# Patient Record
Sex: Female | Born: 1945
Health system: Southern US, Community
[De-identification: ages and names within clinical notes are randomized; demographics above are authoritative.]

## PROBLEM LIST (undated history)

## (undated) DIAGNOSIS — N289 Disorder of kidney and ureter, unspecified: Secondary | ICD-10-CM

## (undated) DIAGNOSIS — G4730 Sleep apnea, unspecified: Secondary | ICD-10-CM

## (undated) DIAGNOSIS — I10 Essential (primary) hypertension: Secondary | ICD-10-CM

## (undated) DIAGNOSIS — E785 Hyperlipidemia, unspecified: Secondary | ICD-10-CM

## (undated) DIAGNOSIS — E119 Type 2 diabetes mellitus without complications: Secondary | ICD-10-CM

## (undated) DIAGNOSIS — I5032 Chronic diastolic (congestive) heart failure: Secondary | ICD-10-CM

## (undated) DIAGNOSIS — R011 Cardiac murmur, unspecified: Secondary | ICD-10-CM

## (undated) HISTORY — DX: Essential (primary) hypertension: I10

## (undated) HISTORY — DX: Hyperlipidemia, unspecified: E78.5

## (undated) HISTORY — PX: ABDOMINAL HYSTERECTOMY: SHX81

## (undated) HISTORY — PX: HERNIA REPAIR: SHX51

## (undated) HISTORY — PX: OTHER SURGICAL HISTORY: SHX169

## (undated) HISTORY — PX: SALPINGOOPHORECTOMY: SHX82

## (undated) HISTORY — PX: TUBAL LIGATION: SHX77

## (undated) HISTORY — DX: Type 2 diabetes mellitus without complications: E11.9

## (undated) HISTORY — DX: Chronic diastolic (congestive) heart failure: I50.32

---

## 2001-07-10 ENCOUNTER — Encounter: Payer: Self-pay | Admitting: Specialist

## 2001-07-10 ENCOUNTER — Ambulatory Visit (HOSPITAL_COMMUNITY): Admission: RE | Admit: 2001-07-10 | Discharge: 2001-07-10 | Payer: Self-pay | Admitting: Specialist

## 2002-09-05 ENCOUNTER — Ambulatory Visit (HOSPITAL_COMMUNITY): Admission: RE | Admit: 2002-09-05 | Discharge: 2002-09-05 | Payer: Self-pay | Admitting: Specialist

## 2002-09-05 ENCOUNTER — Encounter: Payer: Self-pay | Admitting: Specialist

## 2003-10-23 ENCOUNTER — Ambulatory Visit (HOSPITAL_COMMUNITY): Admission: RE | Admit: 2003-10-23 | Discharge: 2003-10-23 | Payer: Self-pay | Admitting: Specialist

## 2004-01-03 ENCOUNTER — Emergency Department (HOSPITAL_COMMUNITY): Admission: EM | Admit: 2004-01-03 | Discharge: 2004-01-03 | Payer: Self-pay | Admitting: Emergency Medicine

## 2005-03-05 ENCOUNTER — Ambulatory Visit (HOSPITAL_COMMUNITY): Admission: RE | Admit: 2005-03-05 | Discharge: 2005-03-05 | Payer: Self-pay | Admitting: Internal Medicine

## 2005-03-05 ENCOUNTER — Ambulatory Visit: Payer: Self-pay | Admitting: Internal Medicine

## 2005-03-05 LAB — HM COLONOSCOPY: HM Colonoscopy: NORMAL

## 2005-06-23 ENCOUNTER — Ambulatory Visit (HOSPITAL_COMMUNITY): Admission: RE | Admit: 2005-06-23 | Discharge: 2005-06-23 | Payer: Self-pay | Admitting: Family Medicine

## 2006-07-28 ENCOUNTER — Ambulatory Visit (HOSPITAL_COMMUNITY): Admission: RE | Admit: 2006-07-28 | Discharge: 2006-07-28 | Payer: Self-pay | Admitting: Family Medicine

## 2007-08-01 ENCOUNTER — Ambulatory Visit (HOSPITAL_COMMUNITY): Admission: RE | Admit: 2007-08-01 | Discharge: 2007-08-01 | Payer: Self-pay | Admitting: Family Medicine

## 2007-09-11 ENCOUNTER — Ambulatory Visit: Payer: Self-pay | Admitting: Family Medicine

## 2007-09-12 ENCOUNTER — Encounter: Payer: Self-pay | Admitting: Family Medicine

## 2007-09-12 LAB — CONVERTED CEMR LAB
Alkaline Phosphatase: 59 units/L (ref 39–117)
BUN: 11 mg/dL (ref 6–23)
Basophils Absolute: 0 10*3/uL (ref 0.0–0.1)
Calcium: 9.9 mg/dL (ref 8.4–10.5)
Chloride: 101 meq/L (ref 96–112)
Creatinine, Ser: 0.78 mg/dL (ref 0.40–1.20)
Glucose, Bld: 131 mg/dL — ABNORMAL HIGH (ref 70–99)
HDL: 79 mg/dL (ref >39)
Hemoglobin: 14 g/dL (ref 12.0–15.0)
Lymphocytes Relative: 39 % (ref 12–46)
Monocytes Absolute: 0.5 10*3/uL (ref 0.1–1.0)
Neutrophils Relative %: 50 % (ref 43–77)
Platelets: 254 10*3/uL (ref 150–400)
Potassium: 3.5 meq/L (ref 3.5–5.3)
TSH: 0.98 microintl units/mL (ref 0.350–5.50)
Total Bilirubin: 0.9 mg/dL (ref 0.3–1.2)
Total CHOL/HDL Ratio: 2.4
Total Protein: 7.1 g/dL (ref 6.0–8.3)

## 2007-09-13 ENCOUNTER — Ambulatory Visit (HOSPITAL_COMMUNITY): Admission: RE | Admit: 2007-09-13 | Discharge: 2007-09-13 | Payer: Self-pay | Admitting: Family Medicine

## 2007-09-18 ENCOUNTER — Ambulatory Visit (HOSPITAL_COMMUNITY): Admission: RE | Admit: 2007-09-18 | Discharge: 2007-09-18 | Payer: Self-pay | Admitting: Family Medicine

## 2007-09-26 ENCOUNTER — Ambulatory Visit: Payer: Self-pay | Admitting: Cardiology

## 2007-09-28 ENCOUNTER — Ambulatory Visit: Payer: Self-pay | Admitting: Cardiology

## 2007-10-04 ENCOUNTER — Ambulatory Visit (HOSPITAL_COMMUNITY): Admission: RE | Admit: 2007-10-04 | Discharge: 2007-10-04 | Payer: Self-pay | Admitting: Cardiology

## 2007-10-09 ENCOUNTER — Ambulatory Visit: Payer: Self-pay | Admitting: Family Medicine

## 2007-10-09 ENCOUNTER — Encounter: Payer: Self-pay | Admitting: Family Medicine

## 2007-10-09 ENCOUNTER — Other Ambulatory Visit: Admission: RE | Admit: 2007-10-09 | Discharge: 2007-10-09 | Payer: Self-pay | Admitting: Family Medicine

## 2007-10-10 ENCOUNTER — Encounter: Payer: Self-pay | Admitting: Family Medicine

## 2007-10-10 LAB — CONVERTED CEMR LAB: Pap Smear: ABNORMAL

## 2007-12-29 ENCOUNTER — Encounter: Payer: Self-pay | Admitting: Family Medicine

## 2007-12-29 LAB — CONVERTED CEMR LAB
ALT: 14 units/L (ref 0–35)
Albumin: 4.3 g/dL (ref 3.5–5.2)
Cholesterol: 166 mg/dL (ref 0–200)
Total Bilirubin: 0.9 mg/dL (ref 0.3–1.2)

## 2008-01-08 ENCOUNTER — Ambulatory Visit: Payer: Self-pay | Admitting: Family Medicine

## 2008-01-10 ENCOUNTER — Encounter: Payer: Self-pay | Admitting: Family Medicine

## 2008-01-10 DIAGNOSIS — E1322 Other specified diabetes mellitus with diabetic chronic kidney disease: Secondary | ICD-10-CM

## 2008-01-10 DIAGNOSIS — E785 Hyperlipidemia, unspecified: Secondary | ICD-10-CM | POA: Insufficient documentation

## 2008-01-10 DIAGNOSIS — E1121 Type 2 diabetes mellitus with diabetic nephropathy: Secondary | ICD-10-CM | POA: Insufficient documentation

## 2008-01-10 DIAGNOSIS — E1169 Type 2 diabetes mellitus with other specified complication: Secondary | ICD-10-CM | POA: Insufficient documentation

## 2008-01-10 DIAGNOSIS — N184 Chronic kidney disease, stage 4 (severe): Secondary | ICD-10-CM

## 2008-01-10 DIAGNOSIS — I10 Essential (primary) hypertension: Secondary | ICD-10-CM | POA: Insufficient documentation

## 2008-01-10 DIAGNOSIS — E1365 Other specified diabetes mellitus with hyperglycemia: Secondary | ICD-10-CM

## 2008-05-09 ENCOUNTER — Ambulatory Visit: Payer: Self-pay | Admitting: Family Medicine

## 2008-05-10 ENCOUNTER — Encounter: Payer: Self-pay | Admitting: Family Medicine

## 2008-05-10 LAB — CONVERTED CEMR LAB: Microalb, Ur: 0.84 mg/dL (ref 0.00–1.89)

## 2008-08-14 ENCOUNTER — Ambulatory Visit (HOSPITAL_COMMUNITY): Admission: RE | Admit: 2008-08-14 | Discharge: 2008-08-14 | Payer: Self-pay | Admitting: Family Medicine

## 2008-08-14 ENCOUNTER — Encounter: Payer: Self-pay | Admitting: Family Medicine

## 2008-08-14 LAB — CONVERTED CEMR LAB
ALT: 16 units/L (ref 0–35)
AST: 18 units/L (ref 0–37)
Alkaline Phosphatase: 73 units/L (ref 39–117)
BUN: 15 mg/dL (ref 6–23)
Chloride: 102 meq/L (ref 96–112)
Creatinine, Ser: 0.76 mg/dL (ref 0.40–1.20)
LDL Cholesterol: 67 mg/dL (ref 0–99)
Potassium: 4.1 meq/L (ref 3.5–5.3)
Sodium: 142 meq/L (ref 135–145)
Total CHOL/HDL Ratio: 1.8
Total Protein: 6.9 g/dL (ref 6.0–8.3)
VLDL: 14 mg/dL (ref 0–40)

## 2008-08-22 ENCOUNTER — Ambulatory Visit: Payer: Self-pay | Admitting: Family Medicine

## 2008-08-22 DIAGNOSIS — B351 Tinea unguium: Secondary | ICD-10-CM

## 2008-09-19 ENCOUNTER — Telehealth: Payer: Self-pay | Admitting: Family Medicine

## 2008-09-24 ENCOUNTER — Telehealth: Payer: Self-pay | Admitting: Family Medicine

## 2008-11-07 ENCOUNTER — Telehealth: Payer: Self-pay | Admitting: Family Medicine

## 2008-11-07 ENCOUNTER — Encounter: Payer: Self-pay | Admitting: Family Medicine

## 2009-04-09 ENCOUNTER — Ambulatory Visit: Payer: Self-pay | Admitting: Family Medicine

## 2009-04-09 LAB — CONVERTED CEMR LAB
Bilirubin Urine: NEGATIVE
Glucose, Urine, Semiquant: NEGATIVE
Ketones, urine, test strip: NEGATIVE
Nitrite: NEGATIVE
Protein, U semiquant: NEGATIVE
Urobilinogen, UA: 0.2
WBC Urine, dipstick: NEGATIVE

## 2009-04-13 DIAGNOSIS — J301 Allergic rhinitis due to pollen: Secondary | ICD-10-CM | POA: Insufficient documentation

## 2009-04-25 ENCOUNTER — Encounter: Payer: Self-pay | Admitting: Family Medicine

## 2009-04-28 LAB — CONVERTED CEMR LAB
ALT: 17 units/L (ref 0–35)
AST: 18 units/L (ref 0–37)
Albumin: 4.5 g/dL (ref 3.5–5.2)
Alkaline Phosphatase: 74 units/L (ref 39–117)
Basophils Absolute: 0 10*3/uL (ref 0.0–0.1)
Basophils Relative: 0 % (ref 0–1)
Bilirubin, Direct: 0.1 mg/dL (ref 0.0–0.3)
Chloride: 104 meq/L (ref 96–112)
Eosinophils Absolute: 0.3 10*3/uL (ref 0.0–0.7)
Eosinophils Relative: 5 % (ref 0–5)
Lymphocytes Relative: 44 % (ref 12–46)
Lymphs Abs: 2.5 10*3/uL (ref 0.7–4.0)
MCHC: 32.9 g/dL (ref 30.0–36.0)
Platelets: 234 10*3/uL (ref 150–400)
Potassium: 3.8 meq/L (ref 3.5–5.3)
RDW: 12.9 % (ref 11.5–15.5)
Sodium: 142 meq/L (ref 135–145)
Total CHOL/HDL Ratio: 2.2
Total Protein: 6.9 g/dL (ref 6.0–8.3)
Triglycerides: 60 mg/dL (ref <150)
VLDL: 12 mg/dL (ref 0–40)

## 2009-05-12 LAB — CONVERTED CEMR LAB
Creatinine, Urine: 21.1 mg/dL
Microalb Creat Ratio: 23.7 mg/g (ref 0.0–30.0)
Microalb, Ur: 0.5 mg/dL (ref 0.00–1.89)

## 2009-05-14 ENCOUNTER — Ambulatory Visit: Payer: Self-pay | Admitting: Family Medicine

## 2009-07-30 ENCOUNTER — Ambulatory Visit: Payer: Self-pay | Admitting: Family Medicine

## 2009-07-30 DIAGNOSIS — E663 Overweight: Secondary | ICD-10-CM | POA: Insufficient documentation

## 2009-07-30 LAB — CONVERTED CEMR LAB
Glucose, Bld: 128 mg/dL
Hgb A1c MFr Bld: 6.3 %

## 2009-08-20 ENCOUNTER — Ambulatory Visit (HOSPITAL_COMMUNITY): Admission: RE | Admit: 2009-08-20 | Discharge: 2009-08-20 | Payer: Self-pay | Admitting: Family Medicine

## 2009-09-23 ENCOUNTER — Encounter (INDEPENDENT_AMBULATORY_CARE_PROVIDER_SITE_OTHER): Payer: Self-pay | Admitting: *Deleted

## 2009-09-23 ENCOUNTER — Ambulatory Visit: Payer: Self-pay | Admitting: Family Medicine

## 2009-09-23 LAB — CONVERTED CEMR LAB
Nitrite: NEGATIVE
Protein, U semiquant: NEGATIVE
Urobilinogen, UA: 0.2

## 2009-10-29 ENCOUNTER — Encounter: Payer: Self-pay | Admitting: Family Medicine

## 2009-11-19 ENCOUNTER — Telehealth (INDEPENDENT_AMBULATORY_CARE_PROVIDER_SITE_OTHER): Payer: Self-pay | Admitting: *Deleted

## 2009-11-20 ENCOUNTER — Ambulatory Visit: Payer: Self-pay | Admitting: Family Medicine

## 2009-11-20 ENCOUNTER — Ambulatory Visit (HOSPITAL_COMMUNITY): Admission: RE | Admit: 2009-11-20 | Discharge: 2009-11-20 | Payer: Self-pay | Admitting: Family Medicine

## 2009-11-20 ENCOUNTER — Encounter (INDEPENDENT_AMBULATORY_CARE_PROVIDER_SITE_OTHER): Payer: Self-pay

## 2009-11-20 ENCOUNTER — Telehealth: Payer: Self-pay | Admitting: Family Medicine

## 2009-11-20 DIAGNOSIS — R5383 Other fatigue: Secondary | ICD-10-CM

## 2009-11-20 DIAGNOSIS — R5381 Other malaise: Secondary | ICD-10-CM

## 2009-11-21 LAB — CONVERTED CEMR LAB
Basophils Absolute: 0 10*3/uL (ref 0.0–0.1)
Basophils Relative: 0 % (ref 0–1)
CO2: 26 meq/L (ref 19–32)
Calcium: 9.5 mg/dL (ref 8.4–10.5)
Chloride: 89 meq/L — ABNORMAL LOW (ref 96–112)
Eosinophils Absolute: 0.1 10*3/uL (ref 0.0–0.7)
Eosinophils Relative: 0 % (ref 0–5)
Glucose, Bld: 144 mg/dL — ABNORMAL HIGH (ref 70–99)
Lymphocytes Relative: 9 % — ABNORMAL LOW (ref 12–46)
MCHC: 33.2 g/dL (ref 30.0–36.0)
Monocytes Absolute: 1.1 10*3/uL — ABNORMAL HIGH (ref 0.1–1.0)
Neutro Abs: 10.7 10*3/uL — ABNORMAL HIGH (ref 1.7–7.7)
Platelets: 183 10*3/uL (ref 150–400)
RDW: 12.9 % (ref 11.5–15.5)

## 2009-12-09 ENCOUNTER — Encounter: Payer: Self-pay | Admitting: Family Medicine

## 2009-12-12 ENCOUNTER — Encounter: Payer: Self-pay | Admitting: Family Medicine

## 2009-12-12 LAB — CONVERTED CEMR LAB
ALT: 16 units/L (ref 0–35)
Albumin: 4.5 g/dL (ref 3.5–5.2)
Alkaline Phosphatase: 67 units/L (ref 39–117)
BUN: 13 mg/dL (ref 6–23)
Calcium: 9.8 mg/dL (ref 8.4–10.5)
Chloride: 102 meq/L (ref 96–112)
Cholesterol: 189 mg/dL (ref 0–200)
Potassium: 4.2 meq/L (ref 3.5–5.3)
Sodium: 140 meq/L (ref 135–145)
Total Protein: 6.9 g/dL (ref 6.0–8.3)
Triglycerides: 70 mg/dL (ref <150)
VLDL: 14 mg/dL (ref 0–40)

## 2009-12-29 ENCOUNTER — Ambulatory Visit: Payer: Self-pay | Admitting: Family Medicine

## 2010-01-01 LAB — CONVERTED CEMR LAB
Eosinophils Absolute: 0.9 10*3/uL — ABNORMAL HIGH (ref 0.0–0.7)
Eosinophils Relative: 11 % — ABNORMAL HIGH (ref 0–5)
HCT: 38.2 % (ref 36.0–46.0)
Hemoglobin: 12.5 g/dL (ref 12.0–15.0)
Hgb A1c MFr Bld: 6.3 % — ABNORMAL HIGH (ref 4.6–6.1)
Lymphocytes Relative: 41 % (ref 12–46)
Lymphs Abs: 3.4 10*3/uL (ref 0.7–4.0)
MCV: 92.9 fL (ref 78.0–100.0)
Neutro Abs: 3.3 10*3/uL (ref 1.7–7.7)
Neutrophils Relative %: 39 % — ABNORMAL LOW (ref 43–77)
WBC: 8.3 10*3/uL (ref 4.0–10.5)

## 2010-03-23 ENCOUNTER — Ambulatory Visit: Payer: Self-pay | Admitting: Family Medicine

## 2010-04-10 ENCOUNTER — Encounter: Payer: Self-pay | Admitting: Family Medicine

## 2010-05-15 LAB — CONVERTED CEMR LAB
Hgb A1c MFr Bld: 6.3 % — ABNORMAL HIGH (ref <5.7)
Microalb, Ur: 0.5 mg/dL (ref 0.00–1.89)

## 2010-05-20 ENCOUNTER — Ambulatory Visit: Payer: Self-pay | Admitting: Family Medicine

## 2010-05-20 ENCOUNTER — Other Ambulatory Visit: Admission: RE | Admit: 2010-05-20 | Discharge: 2010-05-20 | Payer: Self-pay | Admitting: Family Medicine

## 2010-05-20 LAB — CONVERTED CEMR LAB: OCCULT 1: NEGATIVE

## 2010-05-27 ENCOUNTER — Encounter: Payer: Self-pay | Admitting: Family Medicine

## 2010-07-01 ENCOUNTER — Ambulatory Visit: Payer: Self-pay | Admitting: Family Medicine

## 2010-07-01 DIAGNOSIS — N3000 Acute cystitis without hematuria: Secondary | ICD-10-CM | POA: Insufficient documentation

## 2010-07-01 LAB — CONVERTED CEMR LAB
Bilirubin Urine: NEGATIVE
Blood in Urine, dipstick: NEGATIVE
Ketones, urine, test strip: NEGATIVE
Protein, U semiquant: NEGATIVE
Urobilinogen, UA: 0.2

## 2010-07-03 ENCOUNTER — Encounter: Payer: Self-pay | Admitting: Family Medicine

## 2010-08-05 LAB — CONVERTED CEMR LAB
ALT: 17 units/L (ref 0–35)
AST: 18 units/L (ref 0–37)
Alkaline Phosphatase: 71 units/L (ref 39–117)
BUN: 14 mg/dL (ref 6–23)
Bilirubin, Direct: 0.2 mg/dL (ref 0.0–0.3)
HDL: 91 mg/dL (ref >39)
Hgb A1c MFr Bld: 6.2 % — ABNORMAL HIGH (ref <5.7)
Indirect Bilirubin: 0.6 mg/dL (ref 0.0–0.9)
LDL Cholesterol: 107 mg/dL — ABNORMAL HIGH (ref 0–99)
Sodium: 140 meq/L (ref 135–145)
Total Bilirubin: 0.8 mg/dL (ref 0.3–1.2)
Total CHOL/HDL Ratio: 2.3

## 2010-08-17 ENCOUNTER — Ambulatory Visit: Payer: Self-pay | Admitting: Family Medicine

## 2010-08-24 ENCOUNTER — Ambulatory Visit (HOSPITAL_COMMUNITY): Admission: RE | Admit: 2010-08-24 | Discharge: 2010-08-24 | Payer: Self-pay | Admitting: Family Medicine

## 2010-11-03 ENCOUNTER — Telehealth: Payer: Self-pay | Admitting: Family Medicine

## 2010-11-17 NOTE — Assessment & Plan Note (Signed)
Summary: follow up 5-6 weeks/might be a few mins. after/slj   Vital Signs:  Patient profile:   65 year old female Menstrual status:  hysterectomy Height:      67 inches Weight:      186.75 pounds BMI:     29.35 O2 Sat:      98 % on Room air Pulse rate:   63 / minute Pulse rhythm:   regular Resp:     16 per minute BP sitting:   140 / 80  (left arm) Cuff size:   large  Vitals Entered By: Baldomero Lamy, LPN   Nutrition Counseling: Patient's BMI is greater than 25 and therefore counseled on weight management options.  O2 Flow:  Room air CC: Patient c/o urinary frequency   CC:  Patient c/o urinary frequency.  History of Present Illness: Reports  that tshe had been doing well up until the past several days when she developed urinary symtoms. She denies fever or flank pain Denies recent fever or chills. Denies sinus pressure, nasal congestion , ear pain or sore throat. Denies chest congestion, or cough productive of sputum. Denies chest pain, palpitations, PND, orthopnea or leg swelling. Denies abdominal pain, nausea, vomitting, diarrhea or constipation. Denies change in bowel movements or bloody stool.  Denies  joint pain, swelling, or reduced mobility. Denies headaches, vertigo, seizures. Denies depression, anxiety or insomnia. Denies  rash, lesions, or itch.     Allergies: No Known Drug Allergies  Review of Systems      See HPI Eyes:  Denies blurring and discharge. GU:  Complains of dysuria and urinary frequency; 2  day history, no fever , chills or flank pain. Endo:  Denies excessive thirst and excessive urination. Allergy:  Denies hives or rash and itching eyes.  Physical Exam  General:  Well-developed,well-nourished,in no acute distress; alert,appropriate and cooperative throughout examination HEENT: No facial asymmetry,  EOMI, No sinus tenderness, TM's Clear, oropharynx  pink and moist.   Chest: Clear to auscultation bilaterally.  CVS: S1, S2, No murmurs, No  S3.   Abd: Soft, suprapubic tenderness, no flank tenderness  MS: Adequate ROM spine, hips, shoulders and knees.  Ext: No edema.   CNS: CN 2-12 intact, power tone and sensation normal throughout.   Skin: Intact, no visible lesions or rashes.  Psych: Good eye contact, normal affect.  Memory intact, not anxious or depressed appearing.    Impression & Recommendations:  Problem # 1:  ACUTE CYSTITIS (ICD-595.0) Assessment Comment Only  Her updated medication list for this problem includes:    Ciprofloxacin Hcl 500 Mg Tabs (Ciprofloxacin hcl) .Marland Kitchen... Take 1 tablet by mouth two times a day  Orders: UA Dipstick W/ Micro (manual) (81000) T-Culture, Urine [id number])  Encouraged to push clear liquids, get enough rest, and take acetaminophen as needed. To be seen in 10 days if no improvement, sooner if worse.  Problem # 2:  OVERWEIGHT (ICD-278.02) Assessment: Unchanged  Ht: 67 (07/01/2010)   Wt: 186.75 (07/01/2010)   BMI: 29.35 (07/01/2010) therapeutic lifestyle change discussed and encouraged  Problem # 3:  HYPERTENSION (ICD-401.9) Assessment: Improved  The following medications were removed from the medication list:    Benazepril Hcl 40 Mg Tabs (Benazepril hcl) .Marland Kitchen... Take 1 tablet by mouth once a day Her updated medication list for this problem includes:    Atenolol-chlorthalidone 50-25 Mg Tabs (Atenolol-chlorthalidone) .Marland Kitchen... Take one tablet by mouth in the am and one half tablet in the pm    Benazepril Hcl 20 Mg Tabs (Benazepril  hcl) ..... Take 1 tablet by mouth two times a day  Orders: T-Basic Metabolic Panel ([id number])  BP today: 140/80 Prior BP: 160/82 (05/20/2010)  Labs Reviewed: K+: 4.2 (12/12/2009) Creat: : 0.79 (12/12/2009)   Chol: 189 (12/12/2009)   HDL: 87 (12/12/2009)   LDL: 88 (12/12/2009)   TG: 70 (12/12/2009)  Problem # 4:  HYPERLIPIDEMIA (ICD-272.4) Assessment: Comment Only  Her updated medication list for this problem includes:    Simvastatin 40 Mg Tabs  (Simvastatin) .Marland Kitchen... Take 1 tab by mouth at bedtime Low fat dietdiscussed and encouraged   Orders: T-Lipid Profile [id number]) T-Hepatic Function [id number])  Labs Reviewed: SGOT: 16 (12/12/2009)   SGPT: 16 (12/12/2009)   HDL:87 (12/12/2009), 87 (04/25/2009)  LDL:88 (12/12/2009), 89 (04/25/2009)  Chol:189 (12/12/2009), 188 (04/25/2009)  Trig:70 (12/12/2009), 60 (04/25/2009)  Problem # 5:  DIABETES MELLITUS, TYPE II (ICD-250.00) Assessment: Unchanged  The following medications were removed from the medication list:    Benazepril Hcl 40 Mg Tabs (Benazepril hcl) .Marland Kitchen... Take 1 tablet by mouth once a day Her updated medication list for this problem includes:    Aspirin 81 Mg Tbec (Aspirin) .Marland Kitchen... Take 1 tablet by mouth once a day    Metformin Hcl 850 Mg Tabs (Metformin hcl) .Marland Kitchen... Take 1 tablet by mouth once a day    Benazepril Hcl 20 Mg Tabs (Benazepril hcl) .Marland Kitchen... Take 1 tablet by mouth two times a day  Orders: T- Hemoglobin A1C [id number])  Labs Reviewed: Creat: 0.79 (12/12/2009)    Reviewed HgBA1c results: 6.3 (05/15/2010)  6.3 (12/29/2009)  Complete Medication List: 1)  Multivitamins Tabs (Multiple vitamin) .... Take 1 tablet by mouth once a day 2)  Aspirin 81 Mg Tbec (Aspirin) .... Take 1 tablet by mouth once a day 3)  Atenolol-chlorthalidone 50-25 Mg Tabs (Atenolol-chlorthalidone) .... Take one tablet by mouth in the am and one half tablet in the pm 4)  Simvastatin 40 Mg Tabs (Simvastatin) .... Take 1 tab by mouth at bedtime 5)  Metformin Hcl 850 Mg Tabs (Metformin hcl) .... Take 1 tablet by mouth once a day 6)  Accu-chek Aviva Strp (Glucose blood) .... Once daily testing 7)  Fexofenadine Hcl 180 Mg Tabs (Fexofenadine hcl) .... Take 1 daily for allergies 8)  Ciprofloxacin Hcl 500 Mg Tabs (Ciprofloxacin hcl) .... Take 1 tablet by mouth two times a day 9)  Benazepril Hcl 20 Mg Tabs (Benazepril hcl) .... Take 1 tablet by mouth two times a day  Patient Instructions: 1)  F/U  in emd October 2)  Your urine looks infected, med have been sent in to [facility]. 3)  Dose inc on benazepril take 20mg  one TWICE daily pls  4)  BMP prior to visit, ICD-9: 5)  HbgA1C prior to visit, ICD-9: Prescriptions: METFORMIN HCL 850 MG  TABS (METFORMIN HCL) Take 1 tablet by mouth once a day  #90 x 1   Entered by:   Kate Sable LPN   Authorized by:   Tula Nakayama MD   Signed by:   Kate Sable LPN on [id number]   Method used:   Electronically to        [facility] (retail)       [address], [place]  [address]       Ph: [phone]       Fax: [phone]   RxID:   [id number] ATENOLOL-CHLORTHALIDONE 50-25 MG  TABS (ATENOLOL-CHLORTHALIDONE) Take  one tablet by mouth in the am and one half tablet in the pm  #135 Tablet x 1   Entered by:   Kate Sable LPN   Authorized by:   Tula Nakayama MD   Signed by:   Kate Sable LPN on QA348G   Method used:   Electronically to        [facility] (retail)       [address], [place]  [address]       Ph: [phone]       Fax: [phone]   [id number] BENAZEPRIL HCL 20 MG TABS (BENAZEPRIL HCL) Take 1 tablet by mouth two times a day  #180 x 0   Entered and Authorized by:   Tula Nakayama MD   Signed by:   Tula Nakayama MD on 07/01/2010   Method used:   Printed then faxed to ...       [facility] (retail)       [address], [place]  [address]       Ph: [phone]       Fax: [phone]   RxID:   [id number] CIPROFLOXACIN HCL 500 MG TABS (CIPROFLOXACIN HCL) Take 1 tablet by mouth two times a day  #10 x 0   Entered and Authorized by:   Tula Nakayama MD   Signed by:   Tula Nakayama MD on 07/01/2010   Method used:   Electronically to        [facility] (retail)       [address]       [place], [place]  [address]       Ph: [phone]       Fax: [phone]   RxID:   [id number]   Laboratory Results   Urine Tests    Routine Urinalysis   Color: yellow Appearance: Clear Glucose: negative   (Normal Range: Negative) Bilirubin: negative   (Normal Range: Negative) Ketone: negative   (Normal Range: Negative) Spec. Gravity: 1.010   (Normal Range: 1.003-1.035) Blood: negative   (Normal Range: Negative) pH: 7.0   (Normal Range: 5.0-8.0) Protein: negative   (Normal Range: Negative) Urobilinogen: 0.2   (Normal Range: 0-1) Nitrite: negative   (Normal Range: Negative) Leukocyte Esterace: trace   (Normal Range: Negative)

## 2010-11-17 NOTE — Letter (Signed)
Summary: Out of Work  [facility]  [address]   [place], [place] [address]   Phone: [phone]  Fax: [phone]    November 20, 2009   Employee:  TUMEKA [employer]SELT[employer]    To Whom It May Concern:   For Medical reasons, please excuse the above named employee from work for the following dates:  Start:   11/20/2009  End:   11/27/2009 Without Restrictions  If you need additional information, please feel free to contact our office.         Sincerely,    Norwood Levo. Moshe Cipro, M.D.

## 2010-11-17 NOTE — Progress Notes (Signed)
Summary: EYE EXAM  EYE EXAM   Imported By: Dierdre Harness 12/30/2009 10:05:25  _____________________________________________________________________  External Attachment:    Type:   Image     Comment:   External Document

## 2010-11-17 NOTE — Letter (Signed)
Summary: Letter  Letter   Imported By: Dierdre Harness 05/28/2010 14:38:38  _____________________________________________________________________  External Attachment:    Type:   Image     Comment:   External Document

## 2010-11-17 NOTE — Assessment & Plan Note (Signed)
Summary: PHY   Vital Signs:  Patient profile:   65 year old female Menstrual status:  hysterectomy Height:      67 inches Weight:      185.25 pounds BMI:     29.12 O2 Sat:      97 % Pulse rate:   65 / minute Pulse rhythm:   regular Resp:     16 per minute BP sitting:   160 / 82  (left arm) Cuff size:   large  Vitals Entered By: Kate Sable LPN (August  3, 624THL 3:52 PM)  Nutrition Counseling: Patient's BMI is greater than 25 and therefore counseled on weight management options.  Vision Screening:Left eye w/o correction: 20 / 25 Both eyes w/o correction:  20/ 20       Vision Comments: right eye had close vision contact in  Vision Entered By: Kate Sable LPN (August  3, 624THL 3:54 PM)   History of Present Illness: Reports  that she has been doing well. Denies recent fever or chills. Denies sinus pressure, nasal congestion , ear pain or sore throat. Denies chest congestion, or cough productive of sputum. Denies chest pain, palpitations, PND, orthopnea or leg swelling. Denies abdominal pain, nausea, vomitting, diarrhea or constipation. Denies change in bowel movements or bloody stool. Denies dysuria , frequency, incontinence or hesitancy. Denies  joint pain, swelling, or reduced mobility. Denies headaches, vertigo, seizures. Denies depression, anxiety or insomnia. Denies  rash, lesions, or itch.     Current Medications (verified): 1)  Multivitamins   Tabs (Multiple Vitamin) .... Take 1 Tablet By Mouth Once A Day 2)  Aspirin 81 Mg  Tbec (Aspirin) .... Take 1 Tablet By Mouth Once A Day 3)  Atenolol-Chlorthalidone 50-25 Mg  Tabs (Atenolol-Chlorthalidone) .... Take One Tablet By Mouth in The Am and One Half Tablet in The Pm 4)  Simvastatin 40 Mg  Tabs (Simvastatin) .... Take 1 Tab By Mouth At Bedtime 5)  Metformin Hcl 850 Mg  Tabs (Metformin Hcl) .... Take 1 Tablet By Mouth Once A Day 6)  Accu-Chek Aviva  Strp (Glucose Blood) .... Once Daily Testing 7)  Benazepril Hcl 20  Mg Tabs (Benazepril Hcl) .... Take 1 Tablet By Mouth Once A Day 8)  Fexofenadine Hcl 180 Mg Tabs (Fexofenadine Hcl) .... Take 1 Daily For Allergies  Allergies (verified): No Known Drug Allergies  Review of Systems      See HPI General:  Complains of fatigue. Eyes:  Denies blurring, discharge, eye pain, and red eye. Endo:  Denies cold intolerance, excessive hunger, excessive thirst, excessive urination, heat intolerance, polyuria, and weight change; tests fasting sugars daily, seldom over 120. Heme:  Denies abnormal bruising and bleeding. Allergy:  Denies hives or rash and persistent infections.  Physical Exam  General:  Well-developed,well-nourished,in no acute distress; alert,appropriate and cooperative throughout examination Head:  Normocephalic and atraumatic without obvious abnormalities. No apparent alopecia or balding. Eyes:  No corneal or conjunctival inflammation noted. EOMI. Perrla. Funduscopic exam benign, without hemorrhages, exudates or papilledema. Vision grossly normal. Ears:  External ear exam shows no significant lesions or deformities.  Otoscopic examination reveals clear canals, tympanic membranes are intact bilaterally without bulging, retraction, inflammation or discharge. Hearing is grossly normal bilaterally. Nose:  External nasal examination shows no deformity or inflammation. Nasal mucosa are pink and moist without lesions or exudates. Mouth:  Oral mucosa and oropharynx without lesions or exudates.  Teeth in good repair. Neck:  No deformities, masses, or tenderness noted. Chest Wall:  No deformities,  masses, or tenderness noted. Breasts:  No mass, nodules, thickening, tenderness, bulging, retraction, inflamation, nipple discharge or skin changes noted.   Lungs:  Normal respiratory effort, chest expands symmetrically. Lungs are clear to auscultation, no crackles or wheezes. Heart:  Normal rate and regular rhythm. S1 and S2 normal without gallop, murmur, click, rub or  other extra sounds. Abdomen:  Bowel sounds positive,abdomen soft and non-tender without masses, organomegaly or hernias noted. Rectal:  No external abnormalities noted. Normal sphincter tone. No rectal masses or tenderness.guaic neg stool Genitalia:  normal introitus, no external lesions, no vaginal discharge, no vaginal atrophy, and no adnexal masses or tenderness. uterus absent  Msk:  No deformity or scoliosis noted of thoracic or lumbar spine.   Pulses:  R and L carotid,radial,femoral,dorsalis pedis and posterior tibial pulses are full and equal bilaterally Extremities:  No clubbing, cyanosis, edema, or deformity noted with normal full range of motion of all joints.   Neurologic:  No cranial nerve deficits noted. Station and gait are normal. Plantar reflexes are down-going bilaterally. DTRs are symmetrical throughout. Sensory, motor and coordinative functions appear intact. Skin:  Intact without suspicious lesions or rashes Cervical Nodes:  No lymphadenopathy noted Axillary Nodes:  No palpable lymphadenopathy Inguinal Nodes:  No significant adenopathy Psych:  Cognition and judgment appear intact. Alert and cooperative with normal attention span and concentration. No apparent delusions, illusions, hallucinations  Diabetes Management Exam:    Foot Exam (with socks and/or shoes not present):       Sensory-Monofilament:          Left foot: normal          Right foot: normal       Inspection:          Left foot: normal          Right foot: normal       Nails:          Left foot: thickened          Right foot: thickened   Impression & Recommendations:  Problem # 1:  SPECIAL SCREENING FOR MALIGNANT NEOPLASMS COLON (ICD-V76.51) Assessment Comment Only  Orders: Hemoccult Guaiac-1 spec.(in office) ([id number])negative  Problem # 2:  SCREENING FOR MALIGNANT NEOPLASM OF THE CERVIX (ICD-V76.2) Assessment: Comment Only  Orders: Pap Smear [id number])  Problem # 3:  OVERWEIGHT  (ICD-278.02) Assessment: Unchanged  Ht: 67 (05/20/2010)   Wt: 185.25 (05/20/2010)   BMI: 29.12 (05/20/2010)  Problem # 4:  DIABETES MELLITUS, TYPE II (ICD-250.00) Assessment: Unchanged  The following medications were removed from the medication list:    Benazepril Hcl 20 Mg Tabs (Benazepril hcl) .Marland Kitchen... Take 1 tablet by mouth once a day Her updated medication list for this problem includes:    Aspirin 81 Mg Tbec (Aspirin) .Marland Kitchen... Take 1 tablet by mouth once a day    Metformin Hcl 850 Mg Tabs (Metformin hcl) .Marland Kitchen... Take 1 tablet by mouth once a day    Benazepril Hcl 40 Mg Tabs (Benazepril hcl) .Marland Kitchen... Take 1 tablet by mouth once a day  Labs Reviewed: Creat: 0.79 (12/12/2009)    Reviewed HgBA1c results: 6.3 (05/15/2010)  6.3 (12/29/2009)  Problem # 5:  HYPERTENSION (ICD-401.9) Assessment: Deteriorated  The following medications were removed from the medication list:    Benazepril Hcl 20 Mg Tabs (Benazepril hcl) .Marland Kitchen... Take 1 tablet by mouth once a day Her updated medication list for this problem includes:    Atenolol-chlorthalidone 50-25 Mg Tabs (Atenolol-chlorthalidone) .Marland Kitchen... Take one tablet by mouth in the  am and one half tablet in the pm    Benazepril Hcl 40 Mg Tabs (Benazepril hcl) .Marland Kitchen... Take 1 tablet by mouth once a day  BP today: 160/82 Prior BP: 142/80 (03/23/2010)  Labs Reviewed: K+: 4.2 (12/12/2009) Creat: : 0.79 (12/12/2009)   Chol: 189 (12/12/2009)   HDL: 87 (12/12/2009)   LDL: 88 (12/12/2009)   TG: 70 (12/12/2009)  Complete Medication List: 1)  Multivitamins Tabs (Multiple vitamin) .... Take 1 tablet by mouth once a day 2)  Aspirin 81 Mg Tbec (Aspirin) .... Take 1 tablet by mouth once a day 3)  Atenolol-chlorthalidone 50-25 Mg Tabs (Atenolol-chlorthalidone) .... Take one tablet by mouth in the am and one half tablet in the pm 4)  Simvastatin 40 Mg Tabs (Simvastatin) .... Take 1 tab by mouth at bedtime 5)  Metformin Hcl 850 Mg Tabs (Metformin hcl) .... Take 1 tablet by mouth  once a day 6)  Accu-chek Aviva Strp (Glucose blood) .... Once daily testing 7)  Fexofenadine Hcl 180 Mg Tabs (Fexofenadine hcl) .... Take 1 daily for allergies 8)  Benazepril Hcl 40 Mg Tabs (Benazepril hcl) .... Take 1 tablet by mouth once a day  Patient Instructions: 1)  F/U in  5 to 6 weeks. 2)  Your blood pressure is too high, everything else is good, the dose  of benazepril is increased from 20mg  to 40 mg, pls take TWO 20mg  tablets ONCE  daily until your current script is done. 3)  No other med changes at this time. 4)  Pls try and follow a dASH diet and keep your sodium intake down Prescriptions: BENAZEPRIL HCL 40 MG TABS (BENAZEPRIL HCL) Take 1 tablet by mouth once a day  #90 x 1   Entered and Authorized by:   Tula Nakayama MD   Signed by:   Tula Nakayama MD on 05/20/2010   Method used:   Printed then faxed to ...       [facility] (retail)       [address]       [place], [place]  [address]       Ph: [phone]       Fax: [phone]   RxID:   [id number]    Orders Added: 1)  Est. Patient 40-64 years [id number] 2)  Pap Smear [[id number]] 3)  Hemoccult Guaiac-1 spec.(in office) [id number]   Laboratory Results    Stool - Occult Blood Hemmoccult #1: negative Date: 05/20/2010 Comments: [id number] 9R 8/11 118 10 12

## 2010-11-17 NOTE — Assessment & Plan Note (Signed)
Summary: ov   Vital Signs:  Patient profile:   65 year old female Menstrual status:  hysterectomy Height:      67 inches Weight:      185.25 pounds O2 Sat:      95 % Temp:     99.0 degrees F oral Pulse rate:   16 / minute Pulse rhythm:   irregular Resp:     16 per minute BP sitting:   108 / 66 Cuff size:   large  Vitals Entered By: Kate Sable (November 20, 2009 2:20 PM) CC: headache, body aches, cough, unable to prduce any phlegm, fever up to 101, has been going on for 2 days   CC:  headache, body aches, cough, unable to prduce any phlegm, fever up to 101, and has been going on for 2 days.  History of Present Illness: 3 day h/o body aches, sore throat, chills, headache , dry cough,malaise, poor apetite.  Pt worked yesterday despite feeling ill. She has had alot of sick contact on the job, she did have the flu vaccine.  Current Medications (verified): 1)  Multivitamins   Tabs (Multiple Vitamin) .... Take 1 Tablet By Mouth Once A Day 2)  Aspirin 81 Mg  Tbec (Aspirin) .... Take 1 Tablet By Mouth Once A Day 3)  Atenolol-Chlorthalidone 50-25 Mg  Tabs (Atenolol-Chlorthalidone) .... Take One Tablet By Mouth in The Am and One Half Tablet in The Pm 4)  Simvastatin 40 Mg  Tabs (Simvastatin) .... Take 1 Tab By Mouth At Bedtime 5)  Metformin Hcl 850 Mg  Tabs (Metformin Hcl) .... Take 1 Tablet By Mouth Once A Day 6)  Accu-Chek Aviva  Strp (Glucose Blood) .... Once Daily Testing 7)  Benazepril Hcl 20 Mg Tabs (Benazepril Hcl) .... Take 1 Tablet By Mouth Once A Day 8)  Cipro 500 Mg Tabs (Ciprofloxacin Hcl) .... One Tab By Mouth Bid 9)  Cheratussin Ac 100-10 Mg/74ml Syrp (Guaifenesin-Codeine) .... One To Two Tsp Every Six Hours As Needed  Allergies (verified): No Known Drug Allergies  Review of Systems      See HPI General:  Complains of chills, fatigue, fever, loss of appetite, malaise, and weakness. Eyes:  Denies discharge and red eye. ENT:  Complains of sore throat; denies earache,  postnasal drainage, and sinus pressure. CV:  Denies difficulty breathing while lying down, shortness of breath with exertion, and swelling of feet. Resp:  Complains of cough, shortness of breath, and wheezing. GI:  Denies abdominal pain, constipation, nausea, and vomiting blood. GU:  Denies dysuria and urinary frequency. MS:  Complains of joint pain, joint swelling, low back pain, mid back pain, muscle aches, and stiffness. Neuro:  Complains of headaches; denies poor balance and seizures. Endo:  Denies cold intolerance, excessive hunger, excessive thirst, excessive urination, heat intolerance, polyuria, and weight change.  Physical Exam  General:  alert, well-hydrated, and overweight-appearing.Ill appearing. HEENT: No facial asymmetry,  EOMI, No sinus tenderness, TM's Clear, oropharynx  erythematous.   Chest:decreased air entry, bilateral crackles, and wheezes CVS: S1, S2, No murmurs, No S3.   Abd: Soft, Nontender.  MS: Adequate ROM spine, hips, shoulders and knees. Tender to palpation over back, chest and arms. Ext: No edema.   CNS: CN 2-12 intact, power tone and sensation normal throughout.   Skin: Intact, no visible lesions or rashes.  Psych: Good eye contact, normal affect.  Memory intact, not anxious or depressed appearing.      Impression & Recommendations:  Problem # 1:  HEADACHE (ICD-784.0) Assessment Comment Only  Her updated medication list for this problem includes:    Aspirin 81 Mg Tbec (Aspirin) .Marland Kitchen... Take 1 tablet by mouth once a day    Atenolol-chlorthalidone 50-25 Mg Tabs (Atenolol-chlorthalidone) .Marland Kitchen... Take one tablet by mouth in the am and one half tablet in the pm  Orders: Depo- Medrol 80mg  (J1040) Ketorolac-Toradol 15mg  [id number]) Admin of Therapeutic Inj  intramuscular or subcutaneous [id number]) Tylenol 325 mg tab (EMRORAL)  Problem # 2:  ACUTE BRONCHITIS (ICD-466.0) Assessment: Comment Only  Her updated medication list for this problem includes:    Cipro  500 Mg Tabs (Ciprofloxacin hcl) ..... One tab by mouth bid    Cheratussin Ac 100-10 Mg/39ml Syrp (Guaifenesin-codeine) ..... One to two tsp every six hours as needed    Tessalon Perles 100 Mg Caps (Benzonatate) .Marland Kitchen... Take 1 capsule by mouth three times a day    Tussionex Pennkinetic Er 8-10 Mg/32ml Lqcr (Chlorpheniramine-hydrocodone) ..... One teaspoon twice daily as needed  Orders: CXR- 2view (CXR) Rocephin  250mg  [id number]) Albuterol Sulfate Sol 1mg  unit dose [id number]) Atrovent 1mg  (Neb) [id number]) Nebulizer Tx [id number])  Problem # 3:  INFLUENZA WITH OTHER MANIFESTATIONS (ICD-487.8) Assessment: Comment Only tamiflu prescribed , and pt taken pout of work  Problem # 4:  HYPERTENSION (ICD-401.9) Assessment: Improved  Her updated medication list for this problem includes:    Atenolol-chlorthalidone 50-25 Mg Tabs (Atenolol-chlorthalidone) .Marland Kitchen... Take one tablet by mouth in the am and one half tablet in the pm    Benazepril Hcl 20 Mg Tabs (Benazepril hcl) .Marland Kitchen... Take 1 tablet by mouth once a day  BP today: 108/66 Prior BP: 140/80 (09/24/2009)  Labs Reviewed: K+: 3.8 (04/25/2009) Creat: : 0.81 (04/25/2009)   Chol: 188 (04/25/2009)   HDL: 87 (04/25/2009)   LDL: 89 (04/25/2009)   TG: 60 (04/25/2009)  Problem # 5:  DIABETES MELLITUS, TYPE II (ICD-250.00) Assessment: Comment Only  Her updated medication list for this problem includes:    Aspirin 81 Mg Tbec (Aspirin) .Marland Kitchen... Take 1 tablet by mouth once a day    Metformin Hcl 850 Mg Tabs (Metformin hcl) .Marland Kitchen... Take 1 tablet by mouth once a day    Benazepril Hcl 20 Mg Tabs (Benazepril hcl) .Marland Kitchen... Take 1 tablet by mouth once a day  Labs Reviewed: Creat: 0.81 (04/25/2009)    Reviewed HgBA1c results: 6.3 (07/30/2009)  6.4 (04/15/2009)  Complete Medication List: 1)  Multivitamins Tabs (Multiple vitamin) .... Take 1 tablet by mouth once a day 2)  Aspirin 81 Mg Tbec (Aspirin) .... Take 1 tablet by mouth once a day 3)  Atenolol-chlorthalidone 50-25 Mg Tabs  (Atenolol-chlorthalidone) .... Take one tablet by mouth in the am and one half tablet in the pm 4)  Simvastatin 40 Mg Tabs (Simvastatin) .... Take 1 tab by mouth at bedtime 5)  Metformin Hcl 850 Mg Tabs (Metformin hcl) .... Take 1 tablet by mouth once a day 6)  Accu-chek Aviva Strp (Glucose blood) .... Once daily testing 7)  Benazepril Hcl 20 Mg Tabs (Benazepril hcl) .... Take 1 tablet by mouth once a day 8)  Cipro 500 Mg Tabs (Ciprofloxacin hcl) .... One tab by mouth bid 9)  Cheratussin Ac 100-10 Mg/78ml Syrp (Guaifenesin-codeine) .... One to two tsp every six hours as needed 10)  Tamiflu 75 Mg Caps (Oseltamivir phosphate) .... Take 1 capsule by mouth two times a day 11)  Tessalon Perles 100 Mg Caps (Benzonatate) .... Take 1 capsule by mouth three times a day 12)  Tussionex Pennkinetic  Er 8-10 Mg/14ml Lqcr (Chlorpheniramine-hydrocodone) .... One teaspoon twice daily as needed 13)  Klor-con M20 20 Meq Cr-tabs (Potassium chloride crys cr) .... One tab by mouth two times a day  Other Orders: T-Basic Metabolic Panel (99991111) T-CBC w/Diff [phone])  Patient Instructions: 1)  F/u as before. 2)  you are being treated for influenza and bronchitis. 3)  you will get injections for headache and body aches, bronchitis and a breathing treatment. 4)  meds are also sent to your pharmacy. 5)  CBC w/ Diff prior to visit, ICD-9: 6)  BMP prior to visit, ICD-9:   today 7)  cXR today 8)  Get plenty of rest, drink lots of clear liquids, and use Tylenol or Ibuprofen for fever and comfort. 9)  Call  in  3  days if you're not better:sooner if you're feeling worse. Prescriptions: KLOR-CON M20 20 MEQ CR-TABS (POTASSIUM CHLORIDE CRYS CR) one tab by mouth two times a day  #60 x 0   Entered by:   Donna Christen LPN   Authorized by:   Tula Nakayama MD   Signed by:   Donna Christen LPN on [id number]   Method used:   Electronically to        [facility]  [address] (retail)       [place]       [place], [place]  [address]       Ph: [phone]       Fax: [phone]   RxID:   [id number] Cathie Hoops ER 8-10 MG/5ML LQCR (CHLORPHENIRAMINE-HYDROCODONE) one teaspoon twice daily as needed  #300 cc x 0   Entered and Authorized by:   Tula Nakayama MD   Signed by:   Tula Nakayama MD on 11/20/2009   Method used:   Printed then faxed to ...       [facility]  [address] (retail)       [place], [place]  [address]       Ph: [phone]       Fax: [phone]   [id number] ZITHROMAX Z-PAK 250 MG TABS (AZITHROMYCIN) Use as directed  #6 x 0   Entered and Authorized by:   Tula Nakayama MD   Signed by:   Tula Nakayama MD on 11/20/2009   Method used:   Electronically to        [facility] (retail)       [address]       [place], [place]  [address]       Ph: [phone]       Fax: [phone]   RxID:   [id number] TESSALON PERLES 100 MG CAPS (BENZONATATE) Take 1 capsule by mouth three times a day  #30 x 0   Entered and Authorized by:   Tula Nakayama MD   Signed by:   Tula Nakayama MD on 11/20/2009   Method used:   Printed then faxed to ...       [facility]  [address] (retail)       [address] [place], [place]  [address]       Ph: [phone]       Fax: [phone]   RxID:  [id number] TAMIFLU 75 MG CAPS (OSELTAMIVIR PHOSPHATE) Take 1 capsule by mouth two times a day  #10 x 0   Entered and Authorized by:   Tula Nakayama MD   Signed by:   Tula Nakayama MD on 11/20/2009   Method used:   Printed then faxed to ...       [facility]  [address] (retail)       [place], [place]  [address]       Ph: [phone]       Fax: [phone]   RxID:   [id number] CHERATUSSIN AC 100-10 MG/5ML SYRP (GUAIFENESIN-CODEINE) one to two tsp every six hours as needed  #4 oz x 0   Entered by:   Kate Sable    Authorized by:   Tula Nakayama MD   Signed by:   Kate Sable on 11/20/2009   Method used:   Printed then faxed to ...       [facility]  [address] (retail)       [place], [place]  [address]       Ph: [phone]       Fax: [phone]   RxID:   [id number]    Medication Administration  Injection # 1:    Medication: Rocephin  250mg     Diagnosis: ACUTE BRONCHITIS (ICD-466.0)    Route: IM    Site: RUOQ gluteus    Exp Date: 01/2012    Lot #: [id number]    Mfr: novaplus    Comments: 500 mg given     Patient tolerated injection without complications    Given by: Kate Sable (November 20, 2009 4:44 PM)  Injection # 2:    Medication: Depo- Medrol 80mg     Diagnosis: HEADACHE (ICD-784.0)    Route: IM    Site: LUOQ gluteus    Exp Date: 07/2010    Lot #: [id number]    Mfr: Pharmacia    Comments: 80mg  given     Patient tolerated injection without complications    Given by: Kate Sable (November 20, 2009 4:45 PM)  Injection # 3:    Medication: Ketorolac-Toradol 15mg     Diagnosis: HEADACHE (ICD-784.0)    Route: IM    Site: RUOQ gluteus    Exp Date: 06/2011    Lot #: [id number]    Mfr: hospira    Comments: 60mg  given     Patient tolerated injection without complications    Given by: Kate Sable (November 20, 2009 4:47 PM)  Medication # 1:    Medication: Albuterol Sulfate Sol 1mg  unit dose    Diagnosis: ACUTE BRONCHITIS (ICD-466.0)    Dose: 2.5/50ml    Route: inhaled    Exp Date: 8/11    Lot #: [id number]    Mfr: nephron    Patient tolerated medication without complications    Given by: Kate Sable (November 20, 2009 4:48 PM)  Medication # 2:    Medication: Atrovent 1mg  (Neb)    Diagnosis: ACUTE BRONCHITIS (ICD-466.0)    Dose: 0.5/2.59ml    Route: inhaled    Exp Date: 5/11    Lot #: [id number]    Mfr: nephron    Patient tolerated medication without complications    Given by: Kate Sable (November 20, 2009 4:48 PM)  Medication # 3:  Medication: Tylenol 325 mg tab    Diagnosis: HEADACHE (ICD-784.0)    Dose: 2 tablets    Route: po    Exp Date: 12/2009    Lot #: [id number]    Mfr: goldline    Patient tolerated medication without complications    Given by: Kate Sable (November 20, 2009 4:50 PM)  Orders Added: 1)  Est. Patient Level IV [99214] 2)  CXR- 2view [CXR] 3)  T-Basic Metabolic Panel 0000000 4)  T-CBC w/Diff [id number] 5)  Rocephin  250mg  [J0696] 6)  Depo- Medrol 80mg  [J1040] 7)  Ketorolac-Toradol 15mg  [J1885] 8)  Admin of Therapeutic Inj  intramuscular or subcutaneous [96372] 9)  Albuterol Sulfate Sol 1mg  unit dose [J7613] 10)  Atrovent 1mg  (Neb) [id number] 11)  Nebulizer Tx [94640] 12)  Tylenol 325 mg tab [EMRORAL]

## 2010-11-17 NOTE — Assessment & Plan Note (Signed)
Summary: office visit   Vital Signs:  Patient profile:   65 year old female Menstrual status:  hysterectomy Height:      67 inches Weight:      185.75 pounds BMI:     29.20 O2 Sat:      98 % on Room air Pulse rate:   69 / minute Pulse rhythm:   regular Resp:     16 per minute BP sitting:   160 / 90  (left arm)  Vitals Entered By: Louie Casa CMA (August 17, 2010 4:27 PM)  Nutrition Counseling: Patient's BMI is greater than 25 and therefore counseled on weight management options.  O2 Flow:  Room air CC: follow up   Primary Care Provider:  Tula Nakayama MD  CC:  follow up.  History of Present Illness: Reports  that she has been  doing well. Denies recent fever or chills. Denies sinus pressure, nasal congestion , ear pain or sore throat. Denies chest congestion, or cough productive of sputum. Denies chest pain, palpitations, PND, orthopnea or leg swelling. Denies abdominal pain, nausea, vomitting, diarrhea or constipation. Denies change in bowel movements or bloody stool. Denies dysuria , frequency, incontinence or hesitancy. Denies  joint pain, swelling, or reduced mobility. Denies headaches, vertigo, seizures. Denies depression, anxiety or insomnia. Denies  rash, lesions, or itch.     Current Medications (verified): 1)  Multivitamins   Tabs (Multiple Vitamin) .... Take 1 Tablet By Mouth Once A Day 2)  Aspirin 81 Mg  Tbec (Aspirin) .... Take 1 Tablet By Mouth Once A Day 3)  Atenolol-Chlorthalidone 50-25 Mg  Tabs (Atenolol-Chlorthalidone) .... Take One Tablet By Mouth in The Am and One Half Tablet in The Pm 4)  Simvastatin 40 Mg  Tabs (Simvastatin) .... Take 1 Tab By Mouth At Bedtime 5)  Metformin Hcl 850 Mg  Tabs (Metformin Hcl) .... Take 1 Tablet By Mouth Once A Day 6)  Accu-Chek Aviva  Strp (Glucose Blood) .... Once Daily Testing 7)  Benazepril Hcl 40 Mg Tabs (Benazepril Hcl) .... Take 1 Tablet By Mouth One Time A Day  Allergies (verified): No Known Drug  Allergies  Review of Systems      See HPI Eyes:  Denies blurring, discharge, eye pain, and red eye. Endo:  Denies excessive thirst and excessive urination. Heme:  Denies abnormal bruising and bleeding. Allergy:  Denies hives or rash and itching eyes.  Physical Exam  General:  Well-developed,well-nourished,in no acute distress; alert,appropriate and cooperative throughout examination HEENT: No facial asymmetry,  EOMI, No sinus tenderness, TM's Clear, oropharynx  pink and moist.   Chest: Clear to auscultation bilaterally.  CVS: S1, S2, No murmurs, No S3.   Abd: Soft, Nontender.  MS: Adequate ROM spine, hips, shoulders and knees.  Ext: No edema.   CNS: CN 2-12 intact, power tone and sensation normal throughout.   Skin: Intact, no visible lesions or rashes.  Psych: Good eye contact, normal affect.  Memory intact, not anxious or depressed appearing.   Diabetes Management Exam:    Foot Exam (with socks and/or shoes not present):       Sensory-Monofilament:          Left foot: diminished       Inspection:          Left foot: normal          Right foot: normal       Nails:          Left foot: normal  Right foot: normal   Impression & Recommendations:  Problem # 1:  HYPERTENSION (ICD-401.9) Assessment Deteriorated  BP today: 160/90 Prior BP: 140/80 (07/01/2010) Patient advised to follow low sodium diet rich in fruit and vegetables, and to commit to at least 30 minutes 5 days per week of regular exercise , to improve blood presure control.   Labs Reviewed: K+: 3.7 (08/03/2010) Creat: : 0.77 (08/03/2010)   Chol: 211 (08/03/2010)   HDL: 91 (08/03/2010)   LDL: 107 (08/03/2010)   TG: 67 (08/03/2010)  Problem # 2:  OVERWEIGHT (ICD-278.02) Assessment: Unchanged  Ht: 67 (08/17/2010)   Wt: 185.75 (08/17/2010)   BMI: 29.20 (08/17/2010) therapeutic lifestyle change discussed and encouraged  Problem # 3:  DIABETES MELLITUS, TYPE II (ICD-250.00) Assessment: Improved  Her  updated medication list for this problem includes:    Aspirin 81 Mg Tbec (Aspirin) .Marland Kitchen... Take 1 tablet by mouth once a day    Metformin Hcl 850 Mg Tabs (Metformin hcl) .Marland Kitchen... Take 1 tablet by mouth once a day  Orders: T- Hemoglobin A1C [id number])  Labs Reviewed: Creat: 0.77 (08/03/2010)    Reviewed HgBA1c results: 6.2 (08/03/2010)  6.3 (05/15/2010)  Problem # 4:  HYPERLIPIDEMIA (ICD-272.4) Assessment: Deteriorated  Her updated medication list for this problem includes:    Simvastatin 40 Mg Tabs (Simvastatin) .Marland Kitchen... Take 1 tab by mouth at bedtime  Orders: T-Hepatic Function [phone]) T-Lipid Profile [phone]) Low fat dietdiscussed and encouraged  Labs Reviewed: SGOT: 18 (08/03/2010)   SGPT: 17 (08/03/2010)   HDL:91 (08/03/2010), 87 (12/12/2009)  LDL:107 (08/03/2010), 88 (12/12/2009)  Chol:211 (08/03/2010), 189 (12/12/2009)  Trig:67 (08/03/2010), 70 (12/12/2009)  Complete Medication List: 1)  Multivitamins Tabs (Multiple vitamin) .... Take 1 tablet by mouth once a day 2)  Aspirin 81 Mg Tbec (Aspirin) .... Take 1 tablet by mouth once a day 3)  Simvastatin 40 Mg Tabs (Simvastatin) .... Take 1 tab by mouth at bedtime 4)  Metformin Hcl 850 Mg Tabs (Metformin hcl) .... Take 1 tablet by mouth once a day 5)  Accu-chek Aviva Strp (Glucose blood) .... Once daily testing 6)  Benazepril Hcl 40 Mg Tabs (benazepril Hcl)  .... Take 1 tablet by mouth one time a day 7)  Atenolol-chlorthalidone 50-25 Mg Tabs (Atenolol-chlorthalidone) .... One and a half tablets once daily every morning at 6am  Other Orders: T-Basic Metabolic Panel (99991111)  Patient Instructions: 1)  Please schedule a follow-up appointment in 3.5 2)   months. 3)  pls cut back and fried and fatty foods. 4)  You absolutely need to take BP meds as prescibed AND At [place]. 5)  BMP prior to visit, ICD-9: 6)  Hepatic Panel prior to visit, ICD-9: 7)  Lipid Panel prior to visit, ICD-9: 8)  HbgA1C  prior to visit, ICD-9: 9)  P,LS CHECK YOUR bp IN 4 TO 5 WEEKS, IF STILL TOO HIGH CALL FOR AN APPT. cALL IF UNABLE TO TOLERATE THE MEDS Prescriptions: BENAZEPRIL HCL 40 MG TABS (BENAZEPRIL HCL) Take 1 tablet by mouth one time a day  #180 x 2   Entered by:   Louie Casa CMA   Authorized by:   Tula Nakayama MD   Signed by:   Louie Casa CMA on 08/17/2010   Method used:   Printed then faxed to ...       [facility]  [address] (retail)       [place]  [place], [place]  [address]       Ph: [phone]       Fax: [phone]   [id number] METFORMIN HCL 850 MG  TABS (METFORMIN HCL) Take 1 tablet by mouth once a day  #90 x 2   Entered by:   Louie Casa CMA   Authorized by:   Tula Nakayama MD   Signed by:   Louie Casa CMA on 08/17/2010   Method used:   Electronically to        [facility] (retail)       [address]       [place], [place]  [address]       Ph: [phone]       Fax: [phone]   RxID:   [id number]    Orders Added: 1)  Est. Patient Level IV [id number] 2)  T-Basic Metabolic Panel 0000000 3)  T-Hepatic Function [80076-22960] 4)  T-Lipid Profile [80061-22930] 5)  T- Hemoglobin A1C [83036-23375]

## 2010-11-17 NOTE — Miscellaneous (Signed)
Summary: refill  Clinical Lists Changes  Medications: Rx of METFORMIN HCL 850 MG  TABS (METFORMIN HCL) Take 1 tablet by mouth once a day;  #90 x 0;  Signed;  Entered by: Jimmey Ralph LPN;  Authorized by: Tula Nakayama MD;  Method used: Electronically to [facility] [address] [place], [place], [place]  [address], Ph: [phone], Fax: [phone]    Prescriptions: METFORMIN HCL 850 MG  TABS (METFORMIN HCL) Take 1 tablet by mouth once a day  #90 x 0   Entered by:   Jimmey Ralph LPN   Authorized by:   Tula Nakayama MD   Signed by:   Jimmey Ralph LPN on 579FGE   Method used:   Electronically to        [facility] (retail)       [address]       [place]       [place], [place]  [address]       Ph: [phone]       Fax: [phone]   [id number]

## 2010-11-17 NOTE — Progress Notes (Signed)
Summary: EYE EXAM  EYE EXAM   Imported By: Dierdre Harness 12/12/2009 09:50:03  _____________________________________________________________________  External Attachment:    Type:   Image     Comment:   External Document

## 2010-11-17 NOTE — Progress Notes (Signed)
Summary: FEELS BAD  Phone Note Call from Patient   Summary of Call: THOART HURTS COUGHING NOSE RUNNY FEELS BAD  WANTS N URSE TO CALL HER BACK AT [phone] Initial call taken by: Dierdre Harness,  November 19, 2009 2:46 PM  Follow-up for Phone Call        no fever, no chills, no body aches coughing alot, not productive, sore throat  took mucinex and tylenol, not helping Follow-up by: Jimmey Ralph LPN,  February  2, 624THL 2:52 PM  Additional Follow-up for Phone Call Additional follow up Details #1::        Will prescribe a cough syrup for her.  If symptoms don't improve over the next couple of days, or if worsen will need an appt.  Advise her the cough syrup has codeine in it, so may make her drowsy.  Should not take it & drive or work.  Rx - Robitussin AC 4 (four) oz. 1-2 tsp q 6 hrs as needed for cough.  No refill. Additional Follow-up by: Kennith Gain PA,  November 19, 2009 3:07 PM    Additional Follow-up for Phone Call Additional follow up Details #2::    rx sent to [facility]. per patient request Follow-up by: Jimmey Ralph LPN,  February  2, 624THL 4:44 PM  New/Updated Medications: CHERATUSSIN AC 100-10 MG/5ML SYRP (GUAIFENESIN-CODEINE) one to two tsp every six hours as needed Prescriptions: CHERATUSSIN AC 100-10 MG/5ML SYRP (GUAIFENESIN-CODEINE) one to two tsp every six hours as needed  #4 oz x 0   Entered by:   Jimmey Ralph LPN   Authorized by:   Tula Nakayama MD   Signed by:   Jimmey Ralph LPN on [id number]   Method used:   Printed then faxed to ...       [facility] (retail)       [address]       [place]       [place], [place]  [address]       Ph: [phone]       Fax: [phone]   RxID:   [id number]

## 2010-11-17 NOTE — Assessment & Plan Note (Signed)
Summary: office visit   Vital Signs:  Patient profile:   65 year old female Menstrual status:  hysterectomy Height:      67 inches Weight:      185.25 pounds BMI:     29.12 O2 Sat:      98 % Pulse rate:   70 / minute Pulse rhythm:   regular Resp:     16 per minute BP sitting:   140 / 82  (left arm) Cuff size:   large  Vitals Entered By: Kate Sable LPN (March 14, 624THL D34-534 PM)  Nutrition Counseling: Patient's BMI is greater than 25 and therefore counseled on weight management options. CC: Follow up chronic problems Is Patient Diabetic? Yes   CC:  Follow up chronic problems.  History of Present Illness: Reports  that she has been doing well. She has completely recovered from her recent illness, and has no respiratory symptoms. Denies recent fever or chills. Denies sinus pressure, nasal congestion , ear pain or sore throat. Denies chest congestion, or cough productive of sputum. Denies chest pain, palpitations, PND, orthopnea or leg swelling. Denies abdominal pain, nausea, vomitting, diarrhea or constipation. Denies change in bowel movements or bloody stool. Denies dysuria , frequency, incontinence or hesitancy. Denies  joint pain, swelling, or reduced mobility. Denies headaches, vertigo, seizures. Denies depression, anxiety or insomnia. Denies  rash, lesions, or itch.     Current Medications (verified): 1)  Multivitamins   Tabs (Multiple Vitamin) .... Take 1 Tablet By Mouth Once A Day 2)  Aspirin 81 Mg  Tbec (Aspirin) .... Take 1 Tablet By Mouth Once A Day 3)  Atenolol-Chlorthalidone 50-25 Mg  Tabs (Atenolol-Chlorthalidone) .... Take One Tablet By Mouth in The Am and One Half Tablet in The Pm 4)  Simvastatin 40 Mg  Tabs (Simvastatin) .... Take 1 Tab By Mouth At Bedtime 5)  Metformin Hcl 850 Mg  Tabs (Metformin Hcl) .... Take 1 Tablet By Mouth Once A Day 6)  Accu-Chek Aviva  Strp (Glucose Blood) .... Once Daily Testing 7)  Benazepril Hcl 20 Mg Tabs (Benazepril Hcl)  .... Take 1 Tablet By Mouth Once A Day  Allergies (verified): No Known Drug Allergies  Review of Systems      See HPI Eyes:  Denies blurring and discharge. Derm:  Complains of changes in nail beds and lesion(s); hyperpigmentation of nail beds, corns and bunnions. Endo:  Denies cold intolerance, excessive hunger, excessive thirst, excessive urination, heat intolerance, polyuria, and weight change; fasting sugars tesrted avg less than 120, meter brought to office. Heme:  Denies abnormal bruising and bleeding. Allergy:  Denies hives or rash and itching eyes.  Physical Exam  General:  Well-developed,well-nourished,in no acute distress; alert,appropriate and cooperative throughout examination HEENT: No facial asymmetry,  EOMI, No sinus tenderness, TM's Clear, oropharynx  pink and moist.   Chest: Clear to auscultation bilaterally.  CVS: S1, S2, No murmurs, No S3.   Abd: Soft, Nontender.  MS: Adequate ROM spine, hips, shoulders and knees.  Ext: No edema.   CNS: CN 2-12 intact, power tone and sensation normal throughout.   Skin: Intact, no visible lesions or rashes.  Psych: Good eye contact, normal affect.  Memory intact, not anxious or depressed appearing.   Diabetes Management Exam:    Foot Exam (with socks and/or shoes not present):       Sensory-Monofilament:          Left foot: normal       Inspection:  Left foot: abnormal             Comments: corns          Right foot: abnormal             Comments: corns       Nails:          Left foot: thickened          Right foot: thickened   Impression & Recommendations:  Problem # 1:  OVERWEIGHT (ICD-278.02) Assessment Unchanged  Ht: 67 (12/29/2009)   Wt: 185.25 (12/29/2009)   BMI: 29.12 (12/29/2009), pt encouraged to reduce caloric intake and exerciseregularly  Problem # 2:  DIABETES MELLITUS, TYPE II (ICD-250.00) Assessment: Comment Only  Her updated medication list for this problem includes:    Aspirin 81 Mg Tbec  (Aspirin) .Marland Kitchen... Take 1 tablet by mouth once a day    Metformin Hcl 850 Mg Tabs (Metformin hcl) .Marland Kitchen... Take 1 tablet by mouth once a day    Benazepril Hcl 20 Mg Tabs (Benazepril hcl) .Marland Kitchen... Take 1 tablet by mouth once a day  Orders: T- Hemoglobin A1C [id number]) T- Hemoglobin A1C [id number]) T-Urine Microalbumin w/creat. ratio ([id number])Future Orders: Podiatry Referral (Podiatry) ... 12/30/2009  Labs Reviewed: Creat: 0.79 (12/12/2009)    Reviewed HgBA1c results: 6.3 (07/30/2009)  6.4 (04/15/2009)  Problem # 3:  HYPERTENSION (ICD-401.9) Assessment: Deteriorated  Her updated medication list for this problem includes:    Atenolol-chlorthalidone 50-25 Mg Tabs (Atenolol-chlorthalidone) .Marland Kitchen... Take one tablet by mouth in the am and one half tablet in the pm    Benazepril Hcl 20 Mg Tabs (Benazepril hcl) .Marland Kitchen... Take 1 tablet by mouth once a day  Orders: T-Basic Metabolic Panel ([id number])  BP today: 140/82 Prior BP: 108/66 (11/20/2009)  Labs Reviewed: K+: 4.2 (12/12/2009) Creat: : 0.79 (12/12/2009)   Chol: 189 (12/12/2009)   HDL: 87 (12/12/2009)   LDL: 88 (12/12/2009)   TG: 70 (12/12/2009)  Problem # 4:  HYPERLIPIDEMIA (ICD-272.4) Assessment: Unchanged  Her updated medication list for this problem includes:    Simvastatin 40 Mg Tabs (Simvastatin) .Marland Kitchen... Take 1 tab by mouth at bedtime  Labs Reviewed: SGOT: 16 (12/12/2009)   SGPT: 16 (12/12/2009)   HDL:87 (12/12/2009), 87 (04/25/2009)  LDL:88 (12/12/2009), 89 (04/25/2009)  Chol:189 (12/12/2009), 188 (04/25/2009)  Trig:70 (12/12/2009), 60 (04/25/2009)  Complete Medication List: 1)  Multivitamins Tabs (Multiple vitamin) .... Take 1 tablet by mouth once a day 2)  Aspirin 81 Mg Tbec (Aspirin) .... Take 1 tablet by mouth once a day 3)  Atenolol-chlorthalidone 50-25 Mg Tabs (Atenolol-chlorthalidone) .... Take one tablet by mouth in the am and one half tablet in the pm 4)  Simvastatin 40 Mg Tabs (Simvastatin) .... Take 1 tab by  mouth at bedtime 5)  Metformin Hcl 850 Mg Tabs (Metformin hcl) .... Take 1 tablet by mouth once a day 6)  Accu-chek Aviva Strp (Glucose blood) .... Once daily testing 7)  Benazepril Hcl 20 Mg Tabs (Benazepril hcl) .... Take 1 tablet by mouth once a day  Other Orders: T-CBC w/Diff [id number])  Patient Instructions: 1)  CPE in first week in August 2)  CBC w/ Diff prior to visit, ICD-9:   today, no rept copay pls 3)  HbgA1C prior to visit, ICD-9:  and Microalb  after 05/16/2010 4)  You will be referred  to podiatry for toenail care. 5)  Check your blood sugars regularly. If your readings are usually above : or below 70 you should contact our office. 6)  It is important that you exercise regularly at least 20 minutes 5 times a week. If you develop chest pain, have severe difficulty breathing, or feel very tired , stop exercising immediately and seek medical attention. Prescriptions: BENAZEPRIL HCL 20 MG TABS (BENAZEPRIL HCL) Take 1 tablet by mouth once a day  #90 x 2   Entered by:   Kate Sable LPN   Authorized by:   Tula Nakayama MD   Signed by:   Kate Sable LPN on 579FGE   Method used:   Electronically to        [facility] (retail)       [address]       [place], [place]  [address]       Ph: [phone]       Fax: [phone]   [id number] METFORMIN HCL 850 MG  TABS (METFORMIN HCL) Take 1 tablet by mouth once a day  #90 x 2   Entered by:   Kate Sable LPN   Authorized by:   Tula Nakayama MD   Signed by:   Kate Sable LPN on 579FGE   Method used:   Electronically to        [facility] (retail)       [address]       [place]       [place], [place]  [address]       Ph: [phone]       Fax: [phone]   RxIDHW:[id number]

## 2010-11-17 NOTE — Assessment & Plan Note (Signed)
Summary: sick- room 2   Vital Signs:  Patient profile:   65 year old female Menstrual status:  hysterectomy Height:      67 inches Weight:      183.75 pounds BMI:     28.88 O2 Sat:      98 % on Room air Pulse rate:   67 / minute Resp:     16 per minute BP sitting:   142 / 80  (left arm)  Vitals Entered By: Baldomero Lamy LPN (June  6, 624THL D34-534 PM) CC: sinus congestion and cough Is Patient Diabetic? Yes Did you bring your meter with you today? No Pain Assessment Patient in pain? no      Comments did not bring meds to ov   CC:  sinus congestion and cough.  History of Present Illness: Pt presents today with c/o clear nasal congestion, and a cough which is sometimes productive with yellow phlegm.  She states this started over the weekend.  She is feeling better today but still has the cough.  No fever or chills.  No sore throat or ear pain. She is using a left over prescription cough medicine at bedtime.  Hx of htn and DM.  She is taking her meds as prescribed.  She is checking her blood sugars and states they are doing well.  Her next f/u appt is due in Aug.  Allergies (verified): No Known Drug Allergies  Past History:  Past medical history reviewed for relevance to current acute and chronic problems.  Past Medical History: Reviewed history from 01/10/2008 and no changes required. HYPERLIPIDEMIA (ICD-272.4) HYPERTENSION (ICD-401.9) DIABETES MELLITUS, TYPE II (ICD-250.00)  Review of Systems General:  Denies chills and fever. ENT:  Complains of nasal congestion; denies earache, postnasal drainage, sinus pressure, and sore throat. CV:  Denies chest pain or discomfort. Resp:  Complains of cough and sputum productive; denies shortness of breath. Allergy:  Complains of seasonal allergies and sneezing.  Physical Exam  General:  Well-developed,well-nourished,in no acute distress; alert,appropriate and cooperative throughout examination Head:  Normocephalic and atraumatic  without obvious abnormalities. No apparent alopecia or balding. Ears:  External ear exam shows no significant lesions or deformities.  Otoscopic examination reveals clear canals, tympanic membranes are intact bilaterally without bulging, retraction, inflammation or discharge. Hearing is grossly normal bilaterally. Nose:  Nasal turbs mod swollen and bluish Mouth:  Oral mucosa and oropharynx without lesions or exudates.   Neck:  No deformities, masses, or tenderness noted. Lungs:  Normal respiratory effort, chest expands symmetrically. Lungs are clear to auscultation, no crackles or wheezes. Heart:  Normal rate and regular rhythm. S1 and S2 normal without gallop, murmur, click, rub or other extra sounds. Cervical Nodes:  No lymphadenopathy noted Psych:  Cognition and judgment appear intact. Alert and cooperative with normal attention span and concentration. No apparent delusions, illusions, hallucinations   Impression & Recommendations:  Problem # 1:  ALLERGIC RHINITIS, SEASONAL (ICD-477.0) Assessment Deteriorated  Problem # 2:  ACUTE BRONCHITIS (ICD-466.0) Assessment: New  Her updated medication list for this problem includes:    Sulfamethoxazole-tmp Ds 800-160 Mg Tabs (Sulfamethoxazole-trimethoprim) .Marland Kitchen... Take 1 two times a day for 10 days  Problem # 3:  DIABETES MELLITUS, TYPE II (ICD-250.00) Assessment: Comment Only  Her updated medication list for this problem includes:    Aspirin 81 Mg Tbec (Aspirin) .Marland Kitchen... Take 1 tablet by mouth once a day    Metformin Hcl 850 Mg Tabs (Metformin hcl) .Marland Kitchen... Take 1 tablet by mouth once a day  Benazepril Hcl 20 Mg Tabs (Benazepril hcl) .Marland Kitchen... Take 1 tablet by mouth once a day  Labs Reviewed: Creat: 0.79 (12/12/2009)    Reviewed HgBA1c results: 6.3 (12/29/2009)  6.3 (07/30/2009)  Complete Medication List: 1)  Multivitamins Tabs (Multiple vitamin) .... Take 1 tablet by mouth once a day 2)  Aspirin 81 Mg Tbec (Aspirin) .... Take 1 tablet by mouth  once a day 3)  Atenolol-chlorthalidone 50-25 Mg Tabs (Atenolol-chlorthalidone) .... Take one tablet by mouth in the am and one half tablet in the pm 4)  Simvastatin 40 Mg Tabs (Simvastatin) .... Take 1 tab by mouth at bedtime 5)  Metformin Hcl 850 Mg Tabs (Metformin hcl) .... Take 1 tablet by mouth once a day 6)  Accu-chek Aviva Strp (Glucose blood) .... Once daily testing 7)  Benazepril Hcl 20 Mg Tabs (Benazepril hcl) .... Take 1 tablet by mouth once a day 8)  Fexofenadine Hcl 180 Mg Tabs (Fexofenadine hcl) .... Take 1 daily for allergies 9)  Sulfamethoxazole-tmp Ds 800-160 Mg Tabs (Sulfamethoxazole-trimethoprim) .... Take 1 two times a day for 10 days  Patient Instructions: 1)  Keep your next appt with Dr Moshe Cipro. 2)  I have prescribed an allergy medication and an antibiotic for you. 3)  You may still use your cough medicine as needed. 4)  You have received Depo Medrol today to help your syptoms. Prescriptions: SULFAMETHOXAZOLE-TMP DS 800-160 MG TABS (SULFAMETHOXAZOLE-TRIMETHOPRIM) take 1 two times a day for 10 days  #20 x 0   Entered and Authorized by:   Kennith Gain PA   Signed by:   Kennith Gain PA on 03/23/2010   Method used:   Electronically to        [facility] (retail)       [address]       [place], [place]  [address]       Ph: [phone]       Fax: [phone]   RxID:   [id number] FEXOFENADINE HCL 180 MG TABS (FEXOFENADINE HCL) take 1 daily for allergies  #30 x 3   Entered and Authorized by:   Kennith Gain PA   Signed by:   Kennith Gain PA on 03/23/2010   Method used:   Electronically to        [facility] (retail)       [address]       [place], [place]  [address]       Ph: [phone]       Fax: [phone]   RxID:   [id number]   Appended Document: sick- room 2   Medication Administration  Injection # 1:    Medication: Depo-  Medrol 80mg     Diagnosis: ACUTE BRONCHITIS (ICD-466.0)    Route: IM    Site: LUOQ gluteus    Exp Date: 3/12    Lot #: [id number]    Mfr: Pharm[employer]    Patient tolerated injection without complications    Given by: Baldomero Lamy LPN (June  6, 624THL 624THL PM)  Orders Added: 1)  Depo- Medrol 80mg  [J1040] 2)  Admin of Therapeutic Inj  intramuscular or subcutaneous [id number]

## 2010-11-17 NOTE — Letter (Signed)
Summary: [facility]   Imported By: Dierdre Harness 04/10/2010 10:24:22  _____________________________________________________________________  External Attachment:    Type:   Image     Comment:   External Document

## 2010-11-17 NOTE — Progress Notes (Signed)
Summary: sick with fever  Phone Note Call from Patient   Summary of Call: pt is sick and has a fever of 101.5 pt wanted to be worked in but no appts. so I wanted to see what doc wants to do with this one. work her in are call something in.? [phone] Initial call taken by: Lenn Cal,  November 20, 2009 11:32 AM  Follow-up for Phone Call        will see her today at 2:30pm, she knows Follow-up by: Tula Nakayama MD,  November 20, 2009 12:31 PM

## 2010-11-19 NOTE — Progress Notes (Signed)
Summary: refill  Phone Note Call from Patient   Summary of Call: needs a refill on banazepril hcl 40mg  [facility] [phone] Initial call taken by: Lenn Cal,  November 03, 2010 11:42 AM    New/Updated Medications: BENAZEPRIL HCL 40 MG TABS (BENAZEPRIL HCL) one tab by mouth once daily Prescriptions: BENAZEPRIL HCL 40 MG TABS (BENAZEPRIL HCL) one tab by mouth once daily  #30 x 0   Entered by:   Baldomero Lamy LPN   Authorized by:   Tula Nakayama MD   Signed by:   Baldomero Lamy LPN on D34-534   Method used:   Electronically to        [facility]  [address] (retail)       [address]       [place], [place]  [address]       Ph: [phone]       Fax: [phone]   RxID:   [id number]

## 2010-11-23 ENCOUNTER — Encounter: Payer: Self-pay | Admitting: Family Medicine

## 2010-11-25 ENCOUNTER — Telehealth: Payer: Self-pay | Admitting: Family Medicine

## 2010-12-03 NOTE — Letter (Signed)
Summary: right source  right source   Imported By: Dierdre Harness 11/23/2010 13:59:41  _____________________________________________________________________  External Attachment:    Type:   Image     Comment:   External Document

## 2010-12-03 NOTE — Progress Notes (Signed)
Summary: MEDICINE  Phone Note Call from Patient   Summary of Call: NEEDS SIMVASTATIN AND BENAZEPRIL A 30 DAY SUPPLY SEND TO [facility] THE ONES THROUGH THE MAIL WILL BE 10 DAYS OR MORE BEFORE THEY GEY THERE Initial call taken by: Dierdre Harness,  November 25, 2010 11:16 AM    New/Updated Medications: BENAZEPRIL HCL 40 MG TABS (BENAZEPRIL HCL) one tab by mouth once daily Prescriptions: BENAZEPRIL HCL 40 MG TABS (BENAZEPRIL HCL) one tab by mouth once daily  #30 x 0   Entered by:   Baldomero Lamy LPN   Authorized by:   Tula Nakayama MD   Signed by:   Baldomero Lamy LPN on 075-GRM   Method used:   Electronically to        [facility] [address] [address] (retail)       [address]       [place], [place]  [address]       Ph: [phone]       Fax: [phone]   [id number] SIMVASTATIN 40 MG  TABS (SIMVASTATIN) Take 1 tab by mouth at bedtime  #30 x 0   Entered by:   Baldomero Lamy LPN   Authorized by:   Tula Nakayama MD   Signed by:   Baldomero Lamy LPN on 075-GRM   Method used:   Electronically to        [facility]  [address] (retail)       [address]       [place],   [address]       Ph: [phone]       Fax: [phone]   RxID:   [id number]

## 2010-12-06 LAB — CONVERTED CEMR LAB
Bilirubin, Direct: 0.2 mg/dL (ref 0.0–0.3)
Calcium: 10.1 mg/dL (ref 8.4–10.5)
Chloride: 102 meq/L (ref 96–112)
Cholesterol: 208 mg/dL — ABNORMAL HIGH (ref 0–200)
HDL: 99 mg/dL (ref >39)
Hgb A1c MFr Bld: 5.8 % — ABNORMAL HIGH (ref <5.7)
LDL Cholesterol: 93 mg/dL (ref 0–99)
Potassium: 4.5 meq/L (ref 3.5–5.3)
Total CHOL/HDL Ratio: 2.1
Total Protein: 7.3 g/dL (ref 6.0–8.3)
Triglycerides: 78 mg/dL (ref <150)
VLDL: 16 mg/dL (ref 0–40)

## 2010-12-09 ENCOUNTER — Encounter: Payer: Self-pay | Admitting: Family Medicine

## 2010-12-09 ENCOUNTER — Ambulatory Visit (INDEPENDENT_AMBULATORY_CARE_PROVIDER_SITE_OTHER): Payer: Self-pay | Admitting: Family Medicine

## 2010-12-09 DIAGNOSIS — M899 Disorder of bone, unspecified: Secondary | ICD-10-CM | POA: Insufficient documentation

## 2010-12-09 DIAGNOSIS — E119 Type 2 diabetes mellitus without complications: Secondary | ICD-10-CM

## 2010-12-09 DIAGNOSIS — I10 Essential (primary) hypertension: Secondary | ICD-10-CM

## 2010-12-09 DIAGNOSIS — E785 Hyperlipidemia, unspecified: Secondary | ICD-10-CM

## 2010-12-09 DIAGNOSIS — M949 Disorder of cartilage, unspecified: Secondary | ICD-10-CM

## 2010-12-24 NOTE — Assessment & Plan Note (Signed)
Summary: F UP   Vital Signs:  Patient profile:   65 year old female Menstrual status:  hysterectomy Height:      67 inches Weight:      185 pounds BMI:     29.08 O2 Sat:      98 % Pulse rate:   64 / minute Pulse rhythm:   regular Resp:     16 per minute BP sitting:   156 / 90  (left arm)  Vitals Entered By: Kate Sable LPN (February 22, [id number] 1:11 PM)  Nutrition Counseling: Patient's BMI is greater than 25 and therefore counseled on weight management options. CC: Follow up chronic problems   Primary Care Provider:  Tula Nakayama MD  CC:  Follow up chronic problems.  History of Present Illness: Reports  that she has been doing well. She recently retired and is enjoying this. Denies recent fever or chills. Denies sinus pressure, nasal congestion , ear pain or sore throat. Denies chest congestion, or cough productive of sputum. Denies chest pain, palpitations, PND, orthopnea or leg swelling. Denies abdominal pain, nausea, vomitting, diarrhea or constipation. Denies change in bowel movements or bloody stool. Denies dysuria , frequency, incontinence or hesitancy. Denies  joint pain, swelling, or reduced mobility. Denies headaches, vertigo, seizures. Denies depression, anxiety or insomnia. Denies  rash, lesions, or itch.     Current Medications (verified): 1)  Multivitamins   Tabs (Multiple Vitamin) .... Take 1 Tablet By Mouth Once A Day 2)  Aspirin 81 Mg  Tbec (Aspirin) .... Take 1 Tablet By Mouth Once A Day 3)  Simvastatin 40 Mg  Tabs (Simvastatin) .... Take 1 Tab By Mouth At Bedtime 4)  Metformin Hcl 850 Mg  Tabs (Metformin Hcl) .... Take 1 Tablet By Mouth Once A Day 5)  Accu-Chek Aviva  Strp (Glucose Blood) .... Once Daily Testing 6)  Atenolol-Chlorthalidone 50-25 Mg Tabs (Atenolol-Chlorthalidone) .... One and A Half Tablets Once Daily Every Morning At 6am 7)  Benazepril Hcl 40 Mg Tabs (Benazepril Hcl) .... One Tab By Mouth Once Daily  Allergies (verified): No  Known Drug Allergies  Review of Systems      See HPI Eyes:  Denies discharge and red eye. Psych:  Denies anxiety and depression. Endo:  Denies excessive thirst and excessive urination; fasting sugars are seldom over 120. Heme:  Denies abnormal bruising, bleeding, enlarge lymph nodes, and fevers. Allergy:  Denies hives or rash and itching eyes.  Physical Exam  General:  Well-developed,well-nourished,in no acute distress; alert,appropriate and cooperative throughout examination HEENT: No facial asymmetry,  EOMI, No sinus tenderness, TM's Clear, oropharynx  pink and moist.   Chest: Clear to auscultation bilaterally.  CVS: S1, S2, No murmurs, No S3.   Abd: Soft, Nontender.  MS: Adequate ROM spine, hips, shoulders and knees.  Ext: No edema.   CNS: CN 2-12 intact, power tone and sensation normal throughout.   Skin: Intact, no visible lesions or rashes.  Psych: Good eye contact, normal affect.  Memory intact, not anxious or depressed appearing.    Impression & Recommendations:  Problem # 1:  HYPERTENSION (ICD-401.9) Assessment Unchanged  The following medications were removed from the medication list:    Atenolol-chlorthalidone 50-25 Mg Tabs (Atenolol-chlorthalidone) ..... One and a half tablets once daily every morning at 6am    Benazepril Hcl 40 Mg Tabs (Benazepril hcl) ..... One tab by mouth once daily Her updated medication list for this problem includes:    Benazepril Hcl 40 Mg Tabs (Benazepril hcl) ..... One  tab by mouth once daily    Atenolol-chlorthalidone 50-25 Mg Tabs (Atenolol-chlorthalidone) .Marland Kitchen... Take 1 tablet by mouth two times a day dose increase effective 12/09/2010  Orders: Medicare Electronic Prescription [phone]) T-CMP with estimated GFR ([id number])  BP today: 156/90 Prior BP: 160/90 (08/17/2010)  Labs Reviewed: K+: 4.5 (12/04/2010) Creat: : 0.79 (12/04/2010)   Chol: 208 (12/04/2010)   HDL: 99 (12/04/2010)   LDL: 93 (12/04/2010)   TG: 78  (12/04/2010)  Problem # 2:  HYPERLIPIDEMIA (ICD-272.4) Assessment: Improved  Her updated medication list for this problem includes:    Simvastatin 40 Mg Tabs (Simvastatin) .Marland Kitchen... Take 1 tab by mouth at bedtime Low fat dietdiscussed and encouraged  Orders: T-Lipid Profile [id number])  Labs Reviewed: SGOT: 20 (12/04/2010)   SGPT: 15 (12/04/2010)   HDL:99 (12/04/2010), 91 (08/03/2010)  LDL:93 (12/04/2010), 107 (08/03/2010)  Chol:208 (12/04/2010), 211 (08/03/2010)  Trig:78 (12/04/2010), 67 (08/03/2010)  Problem # 3:  DIABETES MELLITUS, TYPE II (ICD-250.00) Assessment: Improved  The following medications were removed from the medication list:    Metformin Hcl 850 Mg Tabs (Metformin hcl) .Marland Kitchen... Take 1 tablet by mouth once a day    Benazepril Hcl 40 Mg Tabs (Benazepril hcl) ..... One tab by mouth once daily Her updated medication list for this problem includes:    Aspirin 81 Mg Tbec (Aspirin) .Marland Kitchen... Take 1 tablet by mouth once a day    Benazepril Hcl 40 Mg Tabs (Benazepril hcl) ..... One tab by mouth once daily    Metformin Hcl 850 Mg Tabs (Metformin hcl) ..... One half effective 12/09/2010  Orders: T- Hemoglobin A1C [id number])  Labs Reviewed: Creat: 0.79 (12/04/2010)    Reviewed HgBA1c results: 5.8 (12/04/2010)  6.2 (08/03/2010)  Problem # 4:  OVERWEIGHT (ICD-278.02) Assessment: Unchanged  Ht: 67 (12/09/2010)   Wt: 185 (12/09/2010)   BMI: 29.08 (12/09/2010) therapeutic lifestyle change discussed and encouraged  Complete Medication List: 1)  Multivitamins Tabs (Multiple vitamin) .... Take 1 tablet by mouth once a day 2)  Aspirin 81 Mg Tbec (Aspirin) .... Take 1 tablet by mouth once a day 3)  Simvastatin 40 Mg Tabs (Simvastatin) .... Take 1 tab by mouth at bedtime 4)  Benazepril Hcl 40 Mg Tabs (Benazepril hcl) .... One tab by mouth once daily 5)  Calcium 600mg  With Vit D 400iu  .... One twice daily 6)  Atenolol-chlorthalidone 50-25 Mg Tabs (Atenolol-chlorthalidone) .... Take 1  tablet by mouth two times a day dose increase effective 12/09/2010 7)  Metformin Hcl 850 Mg Tabs (Metformin hcl) .... One hapn effective 12/09/2010  Other Orders: T-CBC w/Diff [id number]) T-TSH [id number]) T-Vitamin D (25-Hydroxy) [phone])  Patient Instructions: 1)  Please schedule a CPE in 4.5 months. 2)  Nurse BP check in 5 weeks 3)  It is important that you exercise regularly at least 20 minutes 5 times a week. If you develop chest pain, have severe difficulty breathing, or feel very tired , stop exercising immediately and seek medical attention. 4)  You need to lose weight. Consider a lower calorie diet and regular exercise.  5)  BMP prior to visit, ICD-9: and EGFR 6)  Lipid Panel prior to visit, ICD-9: 7)  HbgA1C prior to visit, ICD-9: 8)  cBC, TSH and Vit D  fasting in 4.5 months Prescriptions: BENAZEPRIL HCL 40 MG TABS (BENAZEPRIL HCL) one tab by mouth once daily  #90 x 1   Entered by:   Kate Sable LPN   Authorized by:   Tula Nakayama MD   Signed by:   Velna Hatchet  Hudy LPN on [id number]   Method used:   Faxed to ...       [facility] (mail-order)             , [place]         Ph: [phone]       Fax: [phone]   RxID:   [id number] SIMVASTATIN 40 MG  TABS (SIMVASTATIN) Take 1 tab by mouth at bedtime  #90 x 1   Entered by:   Kate Sable LPN   Authorized by:   Tula Nakayama MD   Signed by:   Kate Sable LPN on [id number]   Method used:   Faxed to ...       [facility] (mail-order)             , [place]         Ph: [phone]       Fax: [phone]   RxID:   [id number] ATENOLOL-CHLORTHALIDONE 50-25 MG TABS (ATENOLOL-CHLORTHALIDONE) Take 1 tablet by mouth two times a day dose increase effective 12/09/2010  #60 x 0   Entered by:   Kate Sable LPN   Authorized by:   Tula Nakayama MD   Signed by:   Kate Sable LPN on [id number]   Method used:   Electronically to        [facility] (retail)       [address]       [place], [place]  [address]       Ph: [phone]       Fax: [phone]   [id number] ATENOLOL-CHLORTHALIDONE 50-25 MG TABS (ATENOLOL-CHLORTHALIDONE) Take 1 tablet by mouth two times a day dose increase effective 12/09/2010  #180 x 3   Entered and Authorized by:   Tula Nakayama MD   Signed by:   Tula Nakayama MD on 12/09/2010   Method used:   Printed then faxed to ...       [facility] (mail-order)             , [place]         Ph: [phone]       Fax: [phone]   RxID:   [id number]    Orders Added: 1)  Est. Patient Level IV [id number] 2)  Medicare Electronic Prescription [id number] 3)  T-CMP with estimated GFR [[id number]] 4)  T-Lipid Profile [[id number]] 5)  T- Hemoglobin A1C [83036-23375] 6)  T-CBC w/Diff [id number] 7)  T-TSH [id number] 8)  T-Vitamin D (25-Hydroxy) [id number]

## 2010-12-31 ENCOUNTER — Encounter: Payer: Self-pay | Admitting: Family Medicine

## 2011-01-05 NOTE — Letter (Signed)
Summary: [facility]  [facility]   Imported By: Dierdre Harness 12/31/2010 10:28:21  _____________________________________________________________________  External Attachment:    Type:   Image     Comment:   External Document

## 2011-01-12 ENCOUNTER — Encounter: Payer: Self-pay | Admitting: Family Medicine

## 2011-01-13 ENCOUNTER — Ambulatory Visit (INDEPENDENT_AMBULATORY_CARE_PROVIDER_SITE_OTHER): Payer: Medicare HMO | Admitting: Family Medicine

## 2011-01-13 ENCOUNTER — Ambulatory Visit: Payer: Self-pay

## 2011-01-13 VITALS — BP 150/80 | Ht 67.0 in | Wt 183.1 lb

## 2011-01-13 DIAGNOSIS — I10 Essential (primary) hypertension: Secondary | ICD-10-CM

## 2011-01-13 MED ORDER — AMLODIPINE BESYLATE 2.5 MG PO TABS: 2.5000 mg | ORAL_TABLET | Freq: Every day | ORAL | Status: DC

## 2011-01-18 NOTE — Progress Notes (Signed)
  Subjective:    Patient ID: Mikayla Jenkins, female    DOB: September 06, 1946, 65 y.o.   MRN: [id number]  HPI    Review of Systems     Objective:   Physical Exam        Assessment & Plan:

## 2011-01-25 ENCOUNTER — Telehealth: Payer: Self-pay | Admitting: Family Medicine

## 2011-01-25 NOTE — Telephone Encounter (Signed)
Pt needs to stop simvastatin and start pravastatin 80mg  before she can start the amlodipine which was prescribed at last ov, she needs to be made aware of this and orders will be sent to [facility] after this, the amlodipine order is being cancelled until this is done

## 2011-03-02 NOTE — Procedures (Signed)
NAMEVANICE, SCHACHT                  ACCOUNT NO.:  [id number]   MEDICAL RECORD NO.:  [id number]          PATIENT TYPE:  OUT   LOCATION:  RAD                           FACILITY:  [facility]   PHYSICIAN:  Cristopher Estimable. Lattie Haw, MD, FACCDATE OF BIRTH:  1946/05/19   DATE OF PROCEDURE:  10/04/2007  DATE OF DISCHARGE:                                ECHOCARDIOGRAM   REFERRING:  Norwood Levo. Moshe Cipro, MD, and Cristopher Estimable. Lattie Haw, MD.   CLINICAL DATA:  A 65 year old woman with EKG abnormalities and multiple  cardiovascular risk factors.   M-mode:  Aorta 3.1, left atrium 3.4, septum 1.2, posterior wall 1.1, LV  diastole 3.9, LV systole 3.4.   1. Technically adequate echocardiographic study.  2. Normal left atrium, right atrium and right ventricle.  3. Normal proximal ascending aorta.  4. Normal and trileaflet aortic valve; normal mitral, tricuspid and      pulmonic valves.  5. Normal internal dimension, wall thickness, regional and global      function of the left ventricle.  6. Normal IVC.      Cristopher Estimable. Lattie Haw, MD, [facility]  Electronically Signed     RMR/MEDQ  D:  10/05/2007  T:  10/05/2007  Job:  [id number]

## 2011-03-02 NOTE — Letter (Signed)
September 26, 2007    Norwood Levo. Moshe Cipro, M.D.  [address]  [place], [place] [address]   RE:  Mikayla, Jenkins  MRN:  [id number]  /  DOB:  14-Nov-1945   Dear Joycelyn Schmid:   It was my pleasure evaluating Mikayla Jenkins in consultation in the office  today at your request for cardiovascular risk factors and EKG  abnormalities.  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  I saw her many years ago, but those  records are not currently available.  She has had a long history of  hypertension that has generally been well controlled.  She has a recent  history of diabetes that likewise is well treated.  She has not used  tobacco products.  She has mild hyperlipidemia that also is medically  controlled.  There is a vague family history of cardiac disease.  She  does well symptomatically with no dyspnea nor chest discomfort.  She has  not exercised much in recent months and is out of shape.  She previously  was substantially overweight, but has lost approximately 30 pounds.   PAST MEDICAL HISTORY:  Notable for a hysterectomy and hernia repair.   She has no known allergies.   CURRENT MEDICATIONS:  1. Tenoretic 50 mg b.i.d.  2. Aspirin 81 mg daily.  3. Metformin 850 mg b.i.d.  4. Simvastatin 40 mg daily.  5. Benazepril 10 mg daily.   SOCIAL HISTORY:  No excessive use of alcohol.  Sedentary lifestyle.  Married with 3 adult children.   FAMILY HISTORY:  Father had uncertain history of cardiac problems and is  deceased.  Mother died of unknown causes.  She has 2 siblings who are  alive and well.   REVIEW OF SYSTEMS:  Contact lenses, nearly complete dentures, history of  a murmur in the past.  All other systems reviewed and are negative.   PHYSICAL EXAMINATION:  Tall, substantial woman in no acute distress.  The weight is 177, blood pressure 115/75, heart rate 60 and regular,  respirations 18.  HEENT:  Normal funduscopic examination.  NECK:  No  jugular venous distension, normal carotid upstrokes without  bruits.  LUNGS:  Clear.  CARDIAC:  Normal 1st heart sounds, accentuated 2nd heart sound, 4th  heart sound present.  Normal PMI.  ABDOMEN:  Soft and nontender, no organomegaly.  EXTREMITIES:  No edema.  Normal distal pulses except for the left  posterior tibial which is obtainable only by Doppler and only with some  difficulty.  NEUROMUSCULAR:  Symmetric strength and tone, normal cranial nerves.  PSYCHIATRIC:  Alert and oriented, normal affect.  SKIN:  No significant lesions.   EKG normal sinus rhythm, left atrial abnormalities, borderline 1st  degree AV block, incomplete right bundle branch block, delayed R-wave  progression, anterolateral T-wave inversion, probable left ventricular  hypertrophy.   Laboratory from your office includes a recent lipid profile obtained on  simvastatin 20 mg daily.  Results were fairly good with a total  cholesterol of 186, triglycerides at 70, HDL of 79, and LDL of 93.  The  simvastatin dose has been increased since.   IMPRESSION:  Mikayla Jenkins clearly has significant cardiovascular risk  factors, albeit well-controlled ones.  She has no symptoms to suggest  myocardial ischemia; however, she wishes to embark upon an exercise  program.  She has fairly significant EKG abnormalities which may all be  attributable to left ventricular hypertrophy.  I believe the stress  echocardiogram will assist in determining whether she has any structural  or functional heart disease.  I will let you know the  results of that study as soon as it has been completed.  If results are  good, as expected, I would simply maintain her current level of medical  therapy and encourage her to increase daily activity.  I will be  available to assist with her care at any time you deem appropriate.  Thanks so much for sending her to see me.    Sincerely,      Cristopher Estimable. Lattie Haw, MD, [facility]  Electronically Signed     RMR/MedQ  DD: 09/26/2007  DT: 09/27/2007  Job #: [id number]

## 2011-03-02 NOTE — Procedures (Signed)
Mikayla Jenkins, Mikayla Jenkins                  ACCOUNT NO.:  [id number]   MEDICAL RECORD NO.:  [id number]          PATIENT TYPE:  OUT   LOCATION:  RAD                           FACILITY:  [facility]   PHYSICIAN:  Cristopher Estimable. Lattie Haw, MD, FACCDATE OF BIRTH:  [birth year]   DATE OF PROCEDURE:  10/04/2007  DATE OF DISCHARGE:                                ECHOCARDIOGRAM   REFERRING PHYSICIANS:  1. Dr. Moshe Cipro.  Soudan Lattie Haw, MD, [facility].   CLINICAL DATA:  A 65 year old woman with EKG abnormalities and multiple  cardiovascular risk factors.   1. Treadmill exercise performed to a workload of seven METS and a      heart rate of 155, 98% of age - predicted maximum.  Exercise      discontinued due to leg fatigue and generalized fatigue.  Blood      pressure increased from a resting value of 140/70 to 180/70 during      exercise and 190/70 early in recovery, a normal response.  No      arrhythmias noted.   1. EKG:  Sinus bradycardia; incomplete right bundle branch block; T-      wave abnormalities representing possible anterior ischemia.  Stress      EKG:  Impressive flat ST-segment depression reaching 2.5-3.5      millimeters in the inferior leads as well as 1-.5 mm in leads V3-      V6; EKG reverted rapidly towards normal in recovery.   1. Resting echocardiogram:  Normal left ventricular size; no LVH;      normal regional and global function.  Post-exercise echocardiogram:  Suboptimal imaging of the apical views;  there appeared to be hyperdynamic function in all myocardial segments.   IMPRESSION:  Negative stress echocardiogram revealing impaired exercise  capacity, a rapid increase in heart rate at low level exercise  consistent with physical deconditioning, normal left ventricular size  and normal left ventricular systolic function.  Despite the presence of  impressive ST-segment depression with exercise, there were no symptoms  to suggest myocardial ischemia and no echocardiographic evidence  for  ischemia or infarction.  Other findings as noted.      Cristopher Estimable. Lattie Haw, MD, [facility]  Electronically Signed     RMR/MEDQ  D:  10/05/2007  T:  10/05/2007  Job:  [id number]

## 2011-03-05 NOTE — Op Note (Signed)
Mikayla Jenkins, Mikayla Jenkins                  ACCOUNT NO.:  [id number]   MEDICAL RECORD NO.:  [id number]          PATIENT TYPE:  AMB   LOCATION:  DAY                           FACILITY:  [facility]   PHYSICIAN:  Hildred Laser, M.D.    DATE OF BIRTH:  November 15, 1945   DATE OF PROCEDURE:  03/05/2005  DATE OF DISCHARGE:                                 OPERATIVE REPORT   PROCEDURE:  Colonoscopy.   INDICATIONS:  [name] is a 65 year old African-American female who is here for  screening colonoscopy.  Family history is negative for colorectal carcinoma.  Procedural risks were reviewed with the patient, and informed consent was  obtained.   PREMEDICATION:  Demerol 50 mg IV, Versed 4 mg IV.   FINDINGS:  Procedure performed in endoscopy suite.  The patient's vital  signs and O2 sats were monitored during the procedure and remained stable.  The patient was placed in the left lateral decubitus position.  Rectal  examination was performed.  No abnormality noted on external or digital  exam.  Olympus videoscope was placed in the rectum and advanced under vision  to the sigmoid colon and beyond.  Preparation was excellent.  The scope was  passed to the cecum, which was identified by the appendiceal orifice and  ileocecal valve.  Pictures taken for the record.  As the scope was  withdrawn, the colonic mucosa was carefully examined.  There was a single  diverticulum in the transverse  colon.  The rest of the colon was normal.  Rectal mucosa similarly was normal.  The scope was retroflexed and examined  in the rectal junction and small hemorrhoids were noted below the dentate  line.  The endoscope was straightened and withdrawn.  The patient tolerated  the procedure well.   FINAL DIAGNOSES:  Small external hemorrhoids and a single diverticulum at  the transverse colon, otherwise normal colonoscopy.   RECOMMENDATIONS:  Yearly Hemoccults.  She may consider her next screening  exam 10 years from now.       NR/MEDQ   D:  03/05/2005  T:  03/05/2005  Job:  [id number]   cc:   Bonne Dolores, M.D.  [address], [place] [address]  Fax: [phone]

## 2011-03-08 ENCOUNTER — Telehealth: Payer: Self-pay | Admitting: Family Medicine

## 2011-03-08 DIAGNOSIS — I10 Essential (primary) hypertension: Secondary | ICD-10-CM

## 2011-03-09 MED ORDER — ATENOLOL-CHLORTHALIDONE 50-25 MG PO TABS: 1.0000 | ORAL_TABLET | Freq: Two times a day (BID) | ORAL | Status: DC

## 2011-03-09 NOTE — Telephone Encounter (Signed)
pls let pt know script sent to her mail order pharmacy

## 2011-03-09 NOTE — Telephone Encounter (Signed)
Dose increased to twice daily on atenolol/chlorthalidone , med sent in

## 2011-04-09 ENCOUNTER — Other Ambulatory Visit: Payer: Self-pay | Admitting: Family Medicine

## 2011-04-09 LAB — LIPID PANEL
LDL Cholesterol: 84 mg/dL (ref 0–99)
Total CHOL/HDL Ratio: 2 Ratio
Triglycerides: 80 mg/dL (ref <150)
VLDL: 16 mg/dL (ref 0–40)

## 2011-04-09 LAB — CBC WITH DIFFERENTIAL/PLATELET
Basophils Relative: 0 % (ref 0–1)
Eosinophils Relative: 6 % — ABNORMAL HIGH (ref 0–5)
Lymphs Abs: 2.1 10*3/uL (ref 0.7–4.0)
MCHC: 32.8 g/dL (ref 30.0–36.0)
MCV: 94.3 fL (ref 78.0–100.0)
Monocytes Absolute: 0.4 10*3/uL (ref 0.1–1.0)
Monocytes Relative: 7 % (ref 3–12)
Neutro Abs: 2.2 10*3/uL (ref 1.7–7.7)
RBC: 4.2 MIL/uL (ref 3.87–5.11)
RDW: 12.8 % (ref 11.5–15.5)
WBC: 4.9 10*3/uL (ref 4.0–10.5)

## 2011-04-10 LAB — COMPLETE METABOLIC PANEL WITH GFR
Albumin: 4.5 g/dL (ref 3.5–5.2)
Alkaline Phosphatase: 74 U/L (ref 39–117)
BUN: 12 mg/dL (ref 6–23)
CO2: 29 mEq/L (ref 19–32)
Chloride: 101 mEq/L (ref 96–112)
GFR, Est Non African American: >60 mL/min (ref >60)
Glucose, Bld: 145 mg/dL — ABNORMAL HIGH (ref 70–99)
Potassium: 4.1 mEq/L (ref 3.5–5.3)
Total Bilirubin: 0.8 mg/dL (ref 0.3–1.2)

## 2011-04-10 LAB — VITAMIN D 25 HYDROXY (VIT D DEFICIENCY, FRACTURES): Vit D, 25-Hydroxy: 55 ng/mL (ref 30–89)

## 2011-04-10 LAB — HEMOGLOBIN A1C: Mean Plasma Glucose: 148 mg/dL — ABNORMAL HIGH (ref <117)

## 2011-04-14 ENCOUNTER — Encounter: Payer: Self-pay | Admitting: Family Medicine

## 2011-04-15 ENCOUNTER — Other Ambulatory Visit (HOSPITAL_COMMUNITY)
Admission: RE | Admit: 2011-04-15 | Discharge: 2011-04-15 | Disposition: A | Discharge status: Home / Self Care | Payer: Medicare HMO | Source: Ambulatory Visit | Attending: Family Medicine | Admitting: Family Medicine

## 2011-04-15 ENCOUNTER — Encounter: Payer: Self-pay | Admitting: Family Medicine

## 2011-04-15 ENCOUNTER — Ambulatory Visit (INDEPENDENT_AMBULATORY_CARE_PROVIDER_SITE_OTHER): Payer: Medicare HMO | Admitting: Family Medicine

## 2011-04-15 DIAGNOSIS — Z1211 Encounter for screening for malignant neoplasm of colon: Secondary | ICD-10-CM

## 2011-04-15 DIAGNOSIS — Z23 Encounter for immunization: Secondary | ICD-10-CM

## 2011-04-15 DIAGNOSIS — Z01419 Encounter for gynecological examination (general) (routine) without abnormal findings: Secondary | ICD-10-CM

## 2011-04-15 DIAGNOSIS — E119 Type 2 diabetes mellitus without complications: Secondary | ICD-10-CM

## 2011-04-15 DIAGNOSIS — E785 Hyperlipidemia, unspecified: Secondary | ICD-10-CM

## 2011-04-15 DIAGNOSIS — Z0000 Encounter for general adult medical examination without abnormal findings: Secondary | ICD-10-CM

## 2011-04-15 DIAGNOSIS — I10 Essential (primary) hypertension: Secondary | ICD-10-CM

## 2011-04-15 MED ORDER — SIMVASTATIN 40 MG PO TABS: ORAL_TABLET | ORAL | Status: DC

## 2011-04-15 MED ORDER — BENAZEPRIL HCL 40 MG PO TABS: ORAL_TABLET | ORAL | Status: DC

## 2011-04-15 NOTE — Patient Instructions (Signed)
F/u in 4 months.  hBA1C and chem 7 in 4 months.  Increase dose of metformin to ONE tab ONCE daily.  No med changes

## 2011-04-18 NOTE — Assessment & Plan Note (Signed)
Controlled, however will inc med to one daily, had been reduced to half

## 2011-04-18 NOTE — Assessment & Plan Note (Signed)
Pt's meter , which is accurate is giving normal numbers outside of the office, I believe there is white coat htn here, will not inc dose at this time

## 2011-04-18 NOTE — Assessment & Plan Note (Signed)
Cholesterol sk\lightly increased, no med change, encouraged pt to reduce fried and fatty foods

## 2011-04-18 NOTE — Progress Notes (Signed)
  Subjective:    Patient ID: Mikayla Jenkins, female    DOB: 1946/01/30, 65 y.o.   MRN: [id number]  HPI The PT is here for annual exam  and re-evaluation of chronic medical conditions, medication management and review of recent lab and radiology data.  Preventive health is updated, specifically  Cancer screening,  and Immunization.   Questions or concerns regarding consultations or procedures which the PT has had in the interim are  addressed. The PT denies any adverse reactions to current medications since the last visit.  There are no new concerns.  There are no specific complaints  Blood sugars are tested daily, and fastings range between 90 to 120      Review of Systems Denies recent fever or chills. Denies sinus pressure, nasal congestion, ear pain or sore throat. Denies chest congestion, productive cough or wheezing. Denies chest pains, palpitations, paroxysmal nocturnal dyspnea, orthopnea and leg swelling Denies abdominal pain, nausea, vomiting,diarrhea or constipation.  Denies rectal bleeding or change in bowel movement. Denies dysuria, frequency, hesitancy or incontinence. Denies joint pain, swelling and limitation in mobility. Denies headaches, seizure, numbness, or tingling. Denies depression, anxiety or insomnia. Denies skin break down or rash.        Objective:   Physical Exam Pleasant well nourished female, alert and oriented x 3, in no cardio-pulmonary distress. Afebrile. HEENT No facial trauma or asymetry. No sinus tenderness  EOMI, PERTL, fundoscopic exam is normal, no hemorhage or exudate.  External ears normal, tympanic membranes clear. Oropharynx moist, no exudate, good dentition. Neck: supple, no adenopathy,JVD or thyromegaly.No bruits.  Chest: Clear to ascultation bilaterally.No crackles or wheezes. Non tender to palpation  Breast: No asymetry,no masses. No nipple discharge or inversion. No axillary or supraclavicular adenopathy  Cardiovascular  system; Heart sounds normal,  S1 and  S2 ,no S3.  No murmur, or thrill. Apical beat not displaced Peripheral pulses normal.  Abdomen: Soft, non tender, no organomegaly or masses. No bruits. Bowel sounds normal. No guarding, tenderness or rebound.  Rectal:  No mass. guaic negative stool.  GU: External genitalia normal. No lesions. Vaginal canal normal.No discharge. Uterus absent, no adnexal masses, no  adnexal tenderness.  Musculoskeletal exam: Full ROM of spine, hips , shoulders and knees. No deformity ,swelling or crepitus noted. No muscle wasting or atrophy.   Neurologic: Cranial nerves 2 to 12 intact. Power, tone ,sensation and reflexes normal throughout. No disturbance in gait. No tremor.  Skin: Intact, no ulceration,  or rash noted.Erythema noted on tip of 2nd toe where pt has been scraping callus Pigmentation normal throughout  Psych; Normal mood and affect. Judgement and concentration normal Diabetic Foot Check:  Appearance - no lesions, calusses are present, also bilateral onycholmycosis  Skin - no unusual pallor or redness Sensation - grossly intact to light touch Monofilament testing -  Right - Great toe, medial, central, lateral ball and posterior foot decreased  Left - Great toe, medial, central, lateral ball and posterior foot decreased Pulses Left - Dorsalis Pedis and Posterior Tibia normal Right - Dorsalis Pedis and Posterior Tibia normal        Assessment & Plan:

## 2011-06-16 ENCOUNTER — Telehealth: Payer: Self-pay | Admitting: Family Medicine

## 2011-06-17 NOTE — Telephone Encounter (Signed)
Advise use sugar free robitussin DM as directed on bottle, and can take otc sudafed one twice daily for 3 to 4 days to reduce sinus drainage

## 2011-06-17 NOTE — Telephone Encounter (Signed)
Coughing a lot from her sinus drainage since Saturday. Coughs all night. Coughing up a tiny bit of white cloudy phlegm but not much. Has been using Mucinex with no relief. Offered appt but she wants something called in since she has no fever chills or green mucus.

## 2011-06-17 NOTE — Telephone Encounter (Signed)
Patient aware.

## 2011-06-17 NOTE — Telephone Encounter (Signed)
Called back and left a message.

## 2011-08-05 LAB — BASIC METABOLIC PANEL
BUN: 14 mg/dL (ref 6–23)
CO2: 29 mEq/L (ref 19–32)
Calcium: 9.7 mg/dL (ref 8.4–10.5)
Creat: 0.81 mg/dL (ref 0.50–1.10)
Glucose, Bld: 116 mg/dL — ABNORMAL HIGH (ref 70–99)

## 2011-08-09 ENCOUNTER — Other Ambulatory Visit: Payer: Self-pay | Admitting: Family Medicine

## 2011-08-09 DIAGNOSIS — Z139 Encounter for screening, unspecified: Secondary | ICD-10-CM

## 2011-08-10 ENCOUNTER — Encounter: Payer: Self-pay | Admitting: Family Medicine

## 2011-08-11 ENCOUNTER — Ambulatory Visit (INDEPENDENT_AMBULATORY_CARE_PROVIDER_SITE_OTHER): Payer: Medicare HMO | Admitting: Family Medicine

## 2011-08-11 ENCOUNTER — Encounter: Payer: Self-pay | Admitting: Family Medicine

## 2011-08-11 VITALS — BP 170/82 | HR 71 | Resp 16 | Ht 67.0 in | Wt 184.4 lb

## 2011-08-11 DIAGNOSIS — E663 Overweight: Secondary | ICD-10-CM

## 2011-08-11 DIAGNOSIS — E785 Hyperlipidemia, unspecified: Secondary | ICD-10-CM

## 2011-08-11 DIAGNOSIS — E119 Type 2 diabetes mellitus without complications: Secondary | ICD-10-CM

## 2011-08-11 DIAGNOSIS — Z23 Encounter for immunization: Secondary | ICD-10-CM

## 2011-08-11 DIAGNOSIS — I10 Essential (primary) hypertension: Secondary | ICD-10-CM

## 2011-08-11 MED ORDER — AMLODIPINE BESYLATE 2.5 MG PO TABS: 2.5000 mg | ORAL_TABLET | Freq: Every day | ORAL | Status: DC

## 2011-08-11 MED ORDER — LOVASTATIN 40 MG PO TABS: 40.0000 mg | ORAL_TABLET | Freq: Every day | ORAL | Status: DC

## 2011-08-11 NOTE — Patient Instructions (Addendum)
F/U in 4 months.  Blood sugar has improved and is excellent   Blood pressure is still too high I recommend new additional medication , amlodipine 2.5 mg, take one at 9 pm at night with your cholesterol medication.  STOP simvastatin when you start amlodipine, and start new lovastatin 40mg  one at bedtime for cholesterol   Fasting lipid, cmp and eGFR, HBA1C in 4 months.  Flu and pneumonia vaccines today  Pls call your insurance company to see if the shingles vaccine is covered  It is important that you exercise regularly at least 30 minutes 5 times a week. If you develop chest pain, have severe difficulty breathing, or feel very A healthy diet is rich in fruit, vegetables and whole grains. Poultry fish, nuts and beans are a healthy choice for protein rather then red meat. A low sodium diet and drinking 64 ounces of water daily is generally recommended. Oils and sweet should be limited. Carbohydrates especially for those who are diabetic or overweight, should be limited to 30-45 gram per meal. It is important to eat on a regular schedule, at least 3 times daily. Snacks should be primarily fruits, vegetables or nuts. tired, stop exercising immediately and seek medical attention

## 2011-08-12 LAB — MICROALBUMIN / CREATININE URINE RATIO
Creatinine, Urine: 105.6 mg/dL
Microalb Creat Ratio: 4.7 mg/g (ref 0.0–30.0)

## 2011-08-27 ENCOUNTER — Ambulatory Visit (HOSPITAL_COMMUNITY)
Admission: RE | Admit: 2011-08-27 | Discharge: 2011-08-27 | Disposition: A | Discharge status: Home / Self Care | Payer: Medicare HMO | Source: Ambulatory Visit | Attending: Family Medicine | Admitting: Family Medicine

## 2011-08-27 DIAGNOSIS — Z1231 Encounter for screening mammogram for malignant neoplasm of breast: Secondary | ICD-10-CM | POA: Insufficient documentation

## 2011-08-27 DIAGNOSIS — Z139 Encounter for screening, unspecified: Secondary | ICD-10-CM

## 2011-09-10 NOTE — Assessment & Plan Note (Addendum)
Uncontrolled, additional medication started, pt to call if she does not tolerate this, as she has in the past. DASH diet and commitment to regular exercise stressed

## 2011-09-10 NOTE — Assessment & Plan Note (Signed)
Unchanged. Patient re-educated about  the importance of commitment to a  minimum of 150 minutes of exercise per week. The importance of healthy food choices with portion control discussed. Encouraged to start a food diary, count calories and to consider  joining a support group. Sample diet sheets offered. Goals set by the patient for the next several months.    

## 2011-09-10 NOTE — Assessment & Plan Note (Signed)
Hyperlipidemia:Low fat diet discussed and encouraged.  No med change, total cholesterol slightly el;evated and pt made aware

## 2011-09-10 NOTE — Assessment & Plan Note (Signed)
Controlled, no change in medication  

## 2011-09-10 NOTE — Progress Notes (Signed)
  Subjective:    Patient ID: Mikayla Jenkins, female    DOB: Oct 07, 1946, 65 y.o.   MRN: [id number]  HPI The PT is here for follow up and re-evaluation of chronic medical conditions, medication management and review of any available recent lab and radiology data.  Preventive health is updated, specifically  Cancer screening and Immunization.   Questions or concerns regarding consultations or procedures which the PT has had in the interim are  addressed. The PT denies any adverse reactions to current medications since the last visit.  There are no new concerns.  There are no specific complaints  Reports fasting sugars range between 90 to 120, often less than 110. She has also started regular exercise on avg 3days per week and is enjoying this      Review of Systems See HPI Denies recent fever or chills. Denies sinus pressure, nasal congestion, ear pain or sore throat. Denies chest congestion, productive cough or wheezing. Denies chest pains, palpitations and leg swelling Denies abdominal pain, nausea, vomiting,diarrhea or constipation.   Denies dysuria, frequency, hesitancy or incontinence. Denies joint pain, swelling and limitation in mobility. Denies headaches, seizures, numbness, or tingling. Denies depression, anxiety or insomnia. Denies skin break down or rash.        Objective:   Physical Exam Patient alert and oriented and in no cardiopulmonary distress.  HEENT: No facial asymmetry, EOMI, no sinus tenderness,  oropharynx pink and moist.  Neck supple no adenopathy.  Chest: Clear to auscultation bilaterally.  CVS: S1, S2 no murmurs, no S3.  ABD: Soft non tender. Bowel sounds normal.  Ext: No edema  MS: Adequate ROM spine, shoulders, hips and knees.  Skin: Intact, no ulcerations or rash noted.  Psych: Good eye contact, normal affect. Memory intact not anxious or depressed appearing.  CNS: CN 2-12 intact, power, tone and sensation normal throughout.          Assessment & Plan:

## 2011-11-30 ENCOUNTER — Other Ambulatory Visit: Payer: Self-pay | Admitting: Family Medicine

## 2011-12-09 LAB — COMPLETE METABOLIC PANEL WITH GFR
Albumin: 4.5 g/dL (ref 3.5–5.2)
Alkaline Phosphatase: 80 U/L (ref 39–117)
BUN: 13 mg/dL (ref 6–23)
CO2: 28 mEq/L (ref 19–32)
Calcium: 9.8 mg/dL (ref 8.4–10.5)
Chloride: 102 mEq/L (ref 96–112)
GFR, Est African American: >89 mL/min (ref >90)
GFR, Est Non African American: 81 mL/min — ABNORMAL LOW (ref >90)
Glucose, Bld: 135 mg/dL — ABNORMAL HIGH (ref 70–99)
Potassium: 3.9 mEq/L (ref 3.5–5.3)
Sodium: 142 mEq/L (ref 135–145)
Total Protein: 6.9 g/dL (ref 6.0–8.3)

## 2011-12-09 LAB — LIPID PANEL: Cholesterol: 200 mg/dL (ref 0–200)

## 2011-12-14 ENCOUNTER — Encounter: Payer: Self-pay | Admitting: Family Medicine

## 2011-12-14 ENCOUNTER — Ambulatory Visit (INDEPENDENT_AMBULATORY_CARE_PROVIDER_SITE_OTHER): Payer: Medicare HMO | Admitting: Family Medicine

## 2011-12-14 VITALS — BP 160/92 | HR 55 | Resp 16 | Ht 67.0 in | Wt 184.0 lb

## 2011-12-14 DIAGNOSIS — E785 Hyperlipidemia, unspecified: Secondary | ICD-10-CM

## 2011-12-14 DIAGNOSIS — I10 Essential (primary) hypertension: Secondary | ICD-10-CM

## 2011-12-14 DIAGNOSIS — E119 Type 2 diabetes mellitus without complications: Secondary | ICD-10-CM

## 2011-12-15 NOTE — Patient Instructions (Addendum)
F/u in 6 weeks  Reduce tenoretic to one every morning. New is maxzide 25mg  one daily.  Copntinue other blood pressure medication as before  Chem 7 in 6 weeks.  Blood sugar is excellent, and so is your cholesterol

## 2011-12-17 ENCOUNTER — Other Ambulatory Visit: Payer: Self-pay | Admitting: Family Medicine

## 2011-12-18 NOTE — Assessment & Plan Note (Signed)
Uncontrolled, change in medication management

## 2011-12-18 NOTE — Assessment & Plan Note (Signed)
Controlled, no change in medication  

## 2011-12-18 NOTE — Progress Notes (Signed)
  Subjective:    Patient ID: Mikayla Jenkins, female    DOB: 02-23-1946, 66 y.o.   MRN: [id number]  HPI The PT is here for follow up and re-evaluation of chronic medical conditions, medication management and review of any available recent lab and radiology data.  Preventive health is updated, specifically  Cancer screening and Immunization.   Questions or concerns regarding consultations or procedures which the PT has had in the interim are  addressed. The PT denies any adverse reactions to current medications since the last visit.  There are no new concerns.  Had stomach virus 1 week ago, but has almost entirely recovered from this. Fasting blood sugars are seldom over 120     Review of Systems See HPI Denies recent fever or chills. Denies sinus pressure, nasal congestion, ear pain or sore throat. Denies chest congestion, productive cough or wheezing. Denies chest pains, palpitations and leg swelling Denies abdominal pain, nausea, vomiting,diarrhea or constipation.   Denies dysuria, frequency, hesitancy or incontinence. Denies joint pain, swelling and limitation in mobility. Denies headaches, seizures, numbness, or tingling. Denies depression, anxiety or insomnia. Denies skin break down or rash.        Objective:   Physical Exam  Patient alert and oriented and in no cardiopulmonary distress.  HEENT: No facial asymmetry, EOMI, no sinus tenderness,  oropharynx pink and moist.  Neck supple no adenopathy.  Chest: Clear to auscultation bilaterally.  CVS: S1, S2 no murmurs, no S3.  ABD: Soft non tender. Bowel sounds normal.  Ext: No edema  MS: Adequate ROM spine, shoulders, hips and knees.  Skin: Intact, no ulcerations or rash noted.  Psych: Good eye contact, normal affect. Memory intact not anxious or depressed appearing.  CNS: CN 2-12 intact, power, tone and sensation normal throughout.       Assessment & Plan:

## 2012-01-21 LAB — BASIC METABOLIC PANEL
BUN: 17 mg/dL (ref 6–23)
Creat: 0.97 mg/dL (ref 0.50–1.10)
Glucose, Bld: 137 mg/dL — ABNORMAL HIGH (ref 70–99)
Potassium: 4.3 mEq/L (ref 3.5–5.3)

## 2012-01-26 ENCOUNTER — Ambulatory Visit (INDEPENDENT_AMBULATORY_CARE_PROVIDER_SITE_OTHER): Payer: Medicare HMO | Admitting: Family Medicine

## 2012-01-26 ENCOUNTER — Encounter: Payer: Self-pay | Admitting: Family Medicine

## 2012-01-26 VITALS — BP 138/82 | HR 59 | Resp 16 | Wt 182.0 lb

## 2012-01-26 DIAGNOSIS — E119 Type 2 diabetes mellitus without complications: Secondary | ICD-10-CM

## 2012-01-26 DIAGNOSIS — E785 Hyperlipidemia, unspecified: Secondary | ICD-10-CM

## 2012-01-26 DIAGNOSIS — I10 Essential (primary) hypertension: Secondary | ICD-10-CM

## 2012-01-26 DIAGNOSIS — R5381 Other malaise: Secondary | ICD-10-CM

## 2012-01-26 DIAGNOSIS — E663 Overweight: Secondary | ICD-10-CM

## 2012-01-26 MED ORDER — METFORMIN HCL 850 MG PO TABS: ORAL_TABLET | ORAL | Status: DC

## 2012-01-26 MED ORDER — ATENOLOL-CHLORTHALIDONE 50-25 MG PO TABS: 1.0000 | ORAL_TABLET | Freq: Every day | ORAL | Status: DC

## 2012-01-26 MED ORDER — BENAZEPRIL HCL 40 MG PO TABS: ORAL_TABLET | ORAL | Status: DC

## 2012-01-26 MED ORDER — AMLODIPINE BESYLATE 2.5 MG PO TABS: 2.5000 mg | ORAL_TABLET | Freq: Every day | ORAL | Status: DC

## 2012-01-26 NOTE — Assessment & Plan Note (Signed)
Hyperlipidemia:Low fat diet discussed and encouraged.  No med change, controlled

## 2012-01-26 NOTE — Progress Notes (Signed)
  Subjective:    Patient ID: Mikayla Jenkins, female    DOB: 07-03-1946, 66 y.o.   MRN: [id number]  HPI The PT is here for follow up and re-evaluation of chronic medical conditions,in particular her blood pressure, also medication management and review of any available recent lab and radiology data.  Preventive health is updated, specifically  Cancer screening and Immunization.   Questions or concerns regarding consultations or procedures which the PT has had in the interim are  addressed. The PT reports that at times she feels light headed in the evenings when she takes amlodipine. We have decided she will take this at bedtime with her cholesterol medication There are no new concerns. Blood sugars remain within target range when checked There are no specific complaints       Review of Systems    See HPI Denies recent fever or chills. Denies sinus pressure, nasal congestion, ear pain or sore throat. Denies chest congestion, productive cough or wheezing. Denies chest pains, palpitations and leg swelling Denies abdominal pain, nausea, vomiting,diarrhea or constipation.   Denies dysuria, frequency, hesitancy or incontinence. Denies joint pain, swelling and limitation in mobility. Denies headaches, seizures, numbness, or tingling. Denies depression, anxiety or insomnia. Denies skin break down or rash.     Objective:   Physical Exam Patient alert and oriented and in no cardiopulmonary distress.  HEENT: No facial asymmetry, EOMI, no sinus tenderness,  oropharynx pink and moist.  Neck supple no adenopathy.  Chest: Clear to auscultation bilaterally.  CVS: S1, S2 no murmurs, no S3.  ABD: Soft non tender. Bowel sounds normal.  Ext: No edema  MS: Adequate ROM spine, shoulders, hips and knees.  Skin: Intact, no ulcerations or rash noted.  Psych: Good eye contact, normal affect. Memory intact not anxious or depressed appearing.  CNS: CN 2-12 intact, power, tone and sensation  normal throughout.        Assessment & Plan:

## 2012-01-26 NOTE — Assessment & Plan Note (Signed)
Marked improvement in control, will keep pt on current med, reassured her heart rate of 58 to 59 is not a reason to change her medication

## 2012-01-26 NOTE — Patient Instructions (Addendum)
F/U in  early July  Blood pressure much improved 138/82. Heart rate is 59, I am not worried about that , the normal rate is 60 to 100, and some of your blood pressure medication slows the heart rate.  Please commit to daily exercise.  NON fasting chem 7, HBA1C, cbc and TSH in July before visit

## 2012-01-26 NOTE — Assessment & Plan Note (Signed)
Controlled, no change in medication  

## 2012-01-26 NOTE — Assessment & Plan Note (Signed)
unchanged Patient re-educated about  the importance of commitment to a  minimum of 150 minutes of exercise per week. The importance of healthy food choices with portion control discussed. Encouraged to start a food diary, count calories and to consider  joining a support group. Sample diet sheets offered. Goals set by the patient for the next several months.    

## 2012-04-12 ENCOUNTER — Other Ambulatory Visit: Payer: Self-pay | Admitting: Family Medicine

## 2012-04-18 LAB — BASIC METABOLIC PANEL
BUN: 18 mg/dL (ref 6–23)
Chloride: 103 mEq/L (ref 96–112)
Glucose, Bld: 79 mg/dL (ref 70–99)
Potassium: 4.3 mEq/L (ref 3.5–5.3)

## 2012-04-18 LAB — CBC WITH DIFFERENTIAL/PLATELET
Basophils Absolute: 0 10*3/uL (ref 0.0–0.1)
HCT: 37 % (ref 36.0–46.0)
Hemoglobin: 12.8 g/dL (ref 12.0–15.0)
Lymphocytes Relative: 38 % (ref 12–46)
Lymphs Abs: 2.1 10*3/uL (ref 0.7–4.0)
MCH: 31.2 pg (ref 26.0–34.0)
MCV: 90.2 fL (ref 78.0–100.0)
Neutro Abs: 2.4 10*3/uL (ref 1.7–7.7)
Neutrophils Relative %: 44 % (ref 43–77)
Platelets: 252 10*3/uL (ref 150–400)
RDW: 12.7 % (ref 11.5–15.5)

## 2012-04-18 LAB — TSH: TSH: 1.357 u[IU]/mL (ref 0.350–4.500)

## 2012-04-26 ENCOUNTER — Encounter: Payer: Self-pay | Admitting: Family Medicine

## 2012-04-26 ENCOUNTER — Ambulatory Visit (INDEPENDENT_AMBULATORY_CARE_PROVIDER_SITE_OTHER): Payer: Medicare HMO | Admitting: Family Medicine

## 2012-04-26 VITALS — BP 158/86 | HR 63 | Resp 18 | Ht 67.0 in | Wt 181.0 lb

## 2012-04-26 DIAGNOSIS — E785 Hyperlipidemia, unspecified: Secondary | ICD-10-CM

## 2012-04-26 DIAGNOSIS — I10 Essential (primary) hypertension: Secondary | ICD-10-CM

## 2012-04-26 DIAGNOSIS — M5431 Sciatica, right side: Secondary | ICD-10-CM | POA: Insufficient documentation

## 2012-04-26 DIAGNOSIS — E119 Type 2 diabetes mellitus without complications: Secondary | ICD-10-CM

## 2012-04-26 DIAGNOSIS — M5430 Sciatica, unspecified side: Secondary | ICD-10-CM

## 2012-04-26 MED ORDER — TRIAMTERENE-HCTZ 75-50 MG PO TABS: 1.0000 | ORAL_TABLET | Freq: Every day | ORAL | Status: DC

## 2012-04-26 MED ORDER — PREDNISONE (PAK) 5 MG PO TABS: 5.0000 mg | ORAL_TABLET | ORAL | Status: DC

## 2012-04-26 MED ORDER — METHYLPREDNISOLONE ACETATE 40 MG/ML IJ SUSP
40.0000 mg | Freq: Once | INTRAMUSCULAR | Status: AC
  Administered 2012-04-26: 40 mg via INTRAMUSCULAR

## 2012-04-26 MED ORDER — KETOROLAC TROMETHAMINE 60 MG/2ML IJ SOLN
60.0000 mg | Freq: Once | INTRAMUSCULAR | Status: AC
  Administered 2012-04-26: 60 mg via INTRAMUSCULAR

## 2012-04-26 MED ORDER — ATENOLOL-CHLORTHALIDONE 50-25 MG PO TABS: 1.0000 | ORAL_TABLET | Freq: Every day | ORAL | Status: DC

## 2012-04-26 NOTE — Progress Notes (Signed)
  Subjective:    Patient ID: Mikayla Jenkins, female    DOB: 04-04-1946, 67 y.o.   MRN: [id number]  HPI The PT is here for follow up and re-evaluation of chronic medical conditions, medication management and review of any available recent lab and radiology data.  Preventive health is updated, specifically  Cancer screening and Immunization.   Questions or concerns regarding consultations or procedures which the PT has had in the interim are  addressed. The PT denies any adverse reactions to current medications since the last visit.  1 month h/o intermittent right buttock pain radiating laterally to just above the knee, denies lower extremity weakness or numbness, no incontinence of stool or urine Blood sugar is checked daily and is seldom over 130 when checked     Review of Systems See HPI Denies recent fever or chills. Denies sinus pressure, nasal congestion, ear pain or sore throat. Denies chest congestion, productive cough or wheezing. Denies chest pains, palpitations and leg swelling Denies abdominal pain, nausea, vomiting,diarrhea or constipation.   Denies dysuria, frequency, hesitancy or incontinence.  Denies headaches, seizures, numbness, or tingling. Denies depression, anxiety or insomnia. Denies skin break down or rash.        Objective:   Physical Exam  Patient alert and oriented and in no cardiopulmonary distress.Patient uncomfortable in pain  HEENT: No facial asymmetry, EOMI, no sinus tenderness,  oropharynx pink and moist.  Neck supple no adenopathy.  Chest: Clear to auscultation bilaterally.  CVS: S1, S2 no murmurs, no S3.  ABD: Soft non tender. Bowel sounds normal.  Ext: No edema  [id number] though adequate  ROM spine,adequate in  shoulders, hips and knees.  Skin: Intact, no ulcerations or rash noted.  Psych: Good eye contact, normal affect. Memory intact not anxious or depressed appearing.  CNS: CN 2-12 intact, power, tone and sensation normal  throughout.       Assessment & Plan:

## 2012-04-26 NOTE — Patient Instructions (Addendum)
l F/u early November, call if you need to be seen earlier.  Blood pressure still not at goal, increase maxzide dose to 50mg  daily, ok to take two 25mg  daily till done  Blood sugar, bone marrow and thyroid and kidney function are all normal  Toradol 60mg  IM and depo medrol 40 mg IM in the office today for right sciatica, and start a 5 day course of prednisone today also  Fasting lipid, cnp and egFR and hBA1c and microalb 10/25 or after  It is important that you exercise regularly at least 30 minutes 5 times a week. If you develop chest pain, have severe difficulty breathing, or feel very tired, stop exercising immediately and seek medical attention    A healthy diet is rich in fruit, vegetables and whole grains. Poultry fish, nuts and beans are a healthy choice for protein rather then red meat. A low sodium diet and drinking 64 ounces of water daily is generally recommended. Oils and sweet should be limited. Carbohydrates especially for those who are diabetic or overweight, should be limited to 30-45 gram per meal. It is important to eat on a regular schedule, at least 3 times daily. Snacks should be primarily fruits, vegetables or nuts.

## 2012-05-02 ENCOUNTER — Telehealth: Payer: Self-pay | Admitting: Family Medicine

## 2012-05-02 NOTE — Telephone Encounter (Signed)
Having pain in her right hip and it runs all the way down her leg. Lastnight she could hardly walk and she has taken all her prednisone and if she sits down and tries to get up she has a hard time walking. Offered appt with Dr Buelah Manis but she wanted Dr Moshe Cipro. Advised she was not here this am. Offered first available at 8 am. Will call back if we have a cancellation

## 2012-05-03 ENCOUNTER — Ambulatory Visit (INDEPENDENT_AMBULATORY_CARE_PROVIDER_SITE_OTHER): Payer: Medicare HMO | Admitting: Family Medicine

## 2012-05-03 ENCOUNTER — Encounter: Payer: Self-pay | Admitting: Family Medicine

## 2012-05-03 ENCOUNTER — Ambulatory Visit (HOSPITAL_COMMUNITY)
Admission: RE | Admit: 2012-05-03 | Discharge: 2012-05-03 | Disposition: A | Discharge status: Home / Self Care | Payer: Medicare HMO | Source: Ambulatory Visit | Attending: Family Medicine | Admitting: Family Medicine

## 2012-05-03 VITALS — BP 174/90 | HR 60 | Resp 16 | Ht 67.0 in | Wt 181.8 lb

## 2012-05-03 DIAGNOSIS — M25559 Pain in unspecified hip: Secondary | ICD-10-CM | POA: Insufficient documentation

## 2012-05-03 DIAGNOSIS — E785 Hyperlipidemia, unspecified: Secondary | ICD-10-CM

## 2012-05-03 DIAGNOSIS — M5431 Sciatica, right side: Secondary | ICD-10-CM

## 2012-05-03 DIAGNOSIS — M5430 Sciatica, unspecified side: Secondary | ICD-10-CM | POA: Insufficient documentation

## 2012-05-03 DIAGNOSIS — I10 Essential (primary) hypertension: Secondary | ICD-10-CM

## 2012-05-03 DIAGNOSIS — M4120 Other idiopathic scoliosis, site unspecified: Secondary | ICD-10-CM | POA: Insufficient documentation

## 2012-05-03 DIAGNOSIS — E119 Type 2 diabetes mellitus without complications: Secondary | ICD-10-CM

## 2012-05-03 MED ORDER — PREDNISONE (PAK) 5 MG PO TABS: 5.0000 mg | ORAL_TABLET | ORAL | Status: DC

## 2012-05-03 MED ORDER — HYDROCODONE-ACETAMINOPHEN 5-500 MG PO TABS: ORAL_TABLET | ORAL | Status: DC

## 2012-05-03 MED ORDER — KETOROLAC TROMETHAMINE 60 MG/2ML IM SOLN
60.0000 mg | Freq: Once | INTRAMUSCULAR | Status: AC
  Administered 2012-05-03: 60 mg via INTRAMUSCULAR

## 2012-05-03 MED ORDER — IBUPROFEN 800 MG PO TABS: 800.0000 mg | ORAL_TABLET | Freq: Three times a day (TID) | ORAL | Status: AC | PRN

## 2012-05-03 MED ORDER — OMEPRAZOLE 20 MG PO CPDR: 20.0000 mg | DELAYED_RELEASE_CAPSULE | Freq: Two times a day (BID) | ORAL | Status: DC

## 2012-05-03 MED ORDER — METHYLPREDNISOLONE ACETATE 80 MG/ML IJ SUSP
80.0000 mg | Freq: Once | INTRAMUSCULAR | Status: AC
  Administered 2012-05-03: 80 mg via INTRAMUSCULAR

## 2012-05-03 NOTE — Assessment & Plan Note (Signed)
Deteriorated, rept anti inflammatory course, x ray and therapy. Vicodin for pain. I f no improvement will need MRI, attempt at local pain doc unsuccessful since she does not take her insurance. No signs suggestive of nerve compromise

## 2012-05-03 NOTE — Progress Notes (Signed)
  Subjective:    Patient ID: Mikayla Jenkins, female    DOB: November 16, 1945, 66 y.o.   MRN: [id number]  HPI Pt had injection las Wednesday for back pain radiating down right leg, helped for about 4.5 days, but by Sunday evening pain was back and worse. No sleep at all last night, sleeping  Was impossible last night, she has to sit eased off right buttock for reliefStanding not comfortable, has to use crutches. No incontinence of stool or urine, no lower extremity weakness or numbness   Review of Systems See HPI Denies recent fever or chills. Denies sinus pressure, nasal congestion, ear pain or sore throat. Denies chest congestion, productive cough or wheezing. Denies chest pains, palpitations and leg swelling Denies abdominal pain, nausea, vomiting,diarrhea or constipation.   Denies dysuria, frequency, hesitancy or incontinence.  Denies headaches, seizures, numbness, or tingling. Has , anxiety  And  Insomnia due to symptom recurrence Denies skin break down or rash.        Objective:   Physical Exam  Patient alert and oriented and in no cardiopulmonary distress.Pt in pain  HEENT: No facial asymmetry, EOMI, no sinus tenderness,  oropharynx pink and moist.  Neck supple no adenopathy.  Chest: Clear to auscultation bilaterally.  CVS: S1, S2 no murmurs, no S3.  ABD: Soft non tender. Bowel sounds normal.  Ext: No edema  MS: decreased  ROM spine, tender over right SI joint, pain radiates down right thigh to calf  Skin: Intact, no ulcerations or rash noted.  Psych: Good eye contact, normal affect. Memory intact not anxious or depressed appearing.  CNS: CN 2-12 intact, power, tone and sensation normal throughout.       Assessment & Plan:

## 2012-05-03 NOTE — Patient Instructions (Addendum)
F/u  In 4 weeks  You  Are referred to physical therapy, and you need an xray today.  You have sciatica.Info will be provided on this New pain medication is also prescribed which has a  Sleepy side effect, vicodin, take with caution.  Repeat anti inflammatory course and injections today, also medication to protect your stomach  PLEASE cALL if worse, you will need an MRI

## 2012-05-03 NOTE — Assessment & Plan Note (Signed)
Uncontrolled, pt in pain and is also sleep deprived

## 2012-05-03 NOTE — Assessment & Plan Note (Signed)
Controlled, no change in medication  

## 2012-05-07 NOTE — Assessment & Plan Note (Signed)
Controlled, no change in medication  

## 2012-05-08 ENCOUNTER — Other Ambulatory Visit: Payer: Self-pay | Admitting: Family Medicine

## 2012-05-08 ENCOUNTER — Telehealth: Payer: Self-pay | Admitting: Family Medicine

## 2012-05-08 DIAGNOSIS — M549 Dorsalgia, unspecified: Secondary | ICD-10-CM

## 2012-05-08 NOTE — Telephone Encounter (Signed)
pls refer for MRI l/S spine I will enter

## 2012-05-14 NOTE — Assessment & Plan Note (Signed)
Hyperlipidemia:Low fat diet discussed and encouraged.  Controlled, no change in medication   

## 2012-05-14 NOTE — Assessment & Plan Note (Signed)
Controlled, no change in medication  

## 2012-05-14 NOTE — Assessment & Plan Note (Signed)
Uncontrolled , dose increase in medication DASH diet and commitment to daily physical activity for a minimum of 30 minutes discussed and encouraged, as a part of hypertension management. The importance of attaining a healthy weight is also discussed.  

## 2012-05-14 NOTE — Assessment & Plan Note (Signed)
Acute onset, anti inflammatories administered in office and also prescribed

## 2012-05-15 ENCOUNTER — Ambulatory Visit (HOSPITAL_COMMUNITY)
Admission: RE | Admit: 2012-05-15 | Discharge: 2012-05-15 | Disposition: A | Discharge status: Home / Self Care | Payer: Medicare HMO | Source: Ambulatory Visit | Attending: Family Medicine | Admitting: Family Medicine

## 2012-05-15 ENCOUNTER — Telehealth: Payer: Self-pay | Admitting: Family Medicine

## 2012-05-15 DIAGNOSIS — IMO0001 Reserved for inherently not codable concepts without codable children: Secondary | ICD-10-CM | POA: Insufficient documentation

## 2012-05-15 DIAGNOSIS — R262 Difficulty in walking, not elsewhere classified: Secondary | ICD-10-CM | POA: Insufficient documentation

## 2012-05-15 DIAGNOSIS — M5137 Other intervertebral disc degeneration, lumbosacral region: Secondary | ICD-10-CM | POA: Insufficient documentation

## 2012-05-15 DIAGNOSIS — M549 Dorsalgia, unspecified: Secondary | ICD-10-CM

## 2012-05-15 DIAGNOSIS — M5126 Other intervertebral disc displacement, lumbar region: Secondary | ICD-10-CM | POA: Insufficient documentation

## 2012-05-15 DIAGNOSIS — M545 Low back pain, unspecified: Secondary | ICD-10-CM | POA: Insufficient documentation

## 2012-05-15 DIAGNOSIS — M6281 Muscle weakness (generalized): Secondary | ICD-10-CM | POA: Insufficient documentation

## 2012-05-15 DIAGNOSIS — M51379 Other intervertebral disc degeneration, lumbosacral region without mention of lumbar back pain or lower extremity pain: Secondary | ICD-10-CM | POA: Insufficient documentation

## 2012-05-15 DIAGNOSIS — M25559 Pain in unspecified hip: Secondary | ICD-10-CM | POA: Insufficient documentation

## 2012-05-15 NOTE — Evaluation (Addendum)
Physical Therapy Evaluation  Patient Details  Name: Mikayla Jenkins MRN: [id number] Date of Birth: June 09, 1946  Today's Date: 05/15/2012 Time: [id number] PT Time Calculation (min): 43 min  Visit#: 1  of 8   Re-eval: 06/14/12 Assessment Diagnosis: R sciatica Next MD Visit:  (06/15/12)  Authorization: [employer] sent in on 7/29  Authorization Time Period:    Authorization Visit#:   of     Past Medical History:  Past Medical History  Diagnosis Date  . Hyperlipidemia   . Hypertension   . Diabetes mellitus type II    Past Surgical History:  Past Surgical History  Procedure Date  . Abdominal hysterectomy   . Tubal ligation   . Salpingoophorectomy   . Inguinal herniorrhapy right   . Right eye surgery secondary to right eye weakness     Subjective Symptoms/Limitations Symptoms: Mikayla Jenkins states that she has no idea what caused her pain she just started having searing pain going down her right leg to the ankle area about two months ago.  The patient states she was given two shots and placed on a med pack that helped for about a week and then the pain retruned she is therefore being referred to therapy. How long can you sit comfortably?: no problem How long can you stand comfortably?: 15-20 minutes How long can you walk comfortably?: with her crutch for less than five minutes; without less than two. Pain Assessment Currently in Pain?: Yes Pain Score:   3 Pain Location: Back (worst is a 10/10) Pain Type: Chronic pain;Neuropathic pain Pain Radiating Towards: r ankle. Pain Onset: More than a month ago Pain Frequency: Constant (constant but varies in intensity.)    Assessment RLE Strength Right Hip Flexion: 3/5 Right Hip Extension: 3-/5 Right Hip ABduction: 3-/5 Right Hip ADduction: 3/5 Right Knee Flexion: 3+/5 Right Knee Extension: 4/5 Right Ankle Dorsiflexion: 3/5 LLE Strength Left Hip Flexion: 4/5 Left Hip Extension: 4/5 Left Hip ABduction: 4/5 Left Hip ADduction:  4/5 Left Knee Flexion: 5/5 Left Knee Extension: 5/5 Left Ankle Dorsiflexion: 5/5 Lumbar AROM Lumbar Flexion: wnl but most ROM is from hip not back pain upon return Lumbar Extension: wfl Lumbar - Right Side Bend: wfl Lumbar - Left Side Bend: wfl Lumbar - Right Rotation: wfl Lumbar - Left Rotation: wfl Palpation Palpation: no spasm palpatable  Exercise/Treatments Mobility/Balance  Ambulation/Gait Ambulation/Gait:  (amb with one crutch decreased wb on R LE)   Stretches Active Hamstring Stretch: 3 reps;30 seconds Single Knee to Chest Stretch: 3 reps;30 seconds Piriformis Stretch: 1 rep;60 seconds;Limitations Piriformis Stretch Limitations: all 4 Supine Ab Set: 5 reps   Physical Therapy Assessment and Plan PT Assessment and Plan Clinical Impression Statement: PT with sciatica and instability of the low back who will benefit from skilled PT to return pt to prior functional level. Pt will benefit from skilled therapeutic intervention in order to improve on the following deficits: Abnormal gait;Difficulty walking;Pain;Decreased mobility;Decreased activity tolerance Rehab Potential: Good PT Frequency: Min 2X/week PT Duration: 4 weeks PT Treatment/Interventions: Therapeutic activities;Therapeutic exercise;Manual techniques;Modalities PT Plan: Pt to be seen two times a week;  Begin bent knee raise, bridge, clams and prone heel squeezes next treatment.    Goals PT Short Term Goals Time to Complete Short Term Goals: 2 weeks PT Short Term Goal 1: stand for 20 minutes without pain PT Short Term Goal 2: ambulating without crutches PT Short Term Goal 3: no radicular sx past the knee level. PT Long Term Goals Time to Complete Long Term Goals: 4  weeks PT Long Term Goal 1: I in Advance HEP PT Long Term Goal 2: Strength to be increased by one grade Long Term Goal 3: Pt able to walk for 30 minutes without pain Long Term Goal 4: no radicular sx. PT Long Term Goal 5: Pain no greater than a 2  80% of the day  Problem List Patient Active Problem List  Diagnosis  . ONYCHOMYCOSIS, TOENAILS  . DIABETES MELLITUS, TYPE II  . HYPERLIPIDEMIA  . OVERWEIGHT  . HYPERTENSION  . ALLERGIC RHINITIS, SEASONAL  . ACUTE CYSTITIS  . FATIGUE  . DISORDER OF BONE AND CARTILAGE UNSPECIFIED  . Sciatica of right side  . Difficulty in walking  . Pain in back  . Decreased muscle strength    PT - End of Session Activity Tolerance: Patient tolerated treatment well General Behavior During Session: [facility] for tasks performed Cognition: [facility] for tasks performed PT Plan of Care PT Home Exercise Plan: given for  stretches and ab set. Consulted and Agree with Plan of Care: Patient  GP  Gcode based on clinical judgement CL                                          Goal           CI  Mikayla Jenkins,CINDY 05/15/2012, 3:48 PM  Physician Documentation Your signature is required to indicate approval of the treatment plan as stated above.  Please sign and either send electronically or make a copy of this report for your files and return this physician signed original.   Please mark one 1.__approve of plan  2. ___approve of plan with the following conditions.   ______________________________                                                          _____________________ Physician Signature                                                                                                             Date

## 2012-05-16 ENCOUNTER — Other Ambulatory Visit: Payer: Self-pay | Admitting: Family Medicine

## 2012-05-16 DIAGNOSIS — M549 Dorsalgia, unspecified: Secondary | ICD-10-CM

## 2012-05-16 NOTE — Telephone Encounter (Signed)
Patient is aware 

## 2012-05-18 ENCOUNTER — Ambulatory Visit (HOSPITAL_COMMUNITY)
Admission: RE | Admit: 2012-05-18 | Discharge: 2012-05-18 | Disposition: A | Discharge status: Home / Self Care | Payer: Medicare HMO | Source: Ambulatory Visit | Attending: Family Medicine | Admitting: Family Medicine

## 2012-05-18 DIAGNOSIS — IMO0001 Reserved for inherently not codable concepts without codable children: Secondary | ICD-10-CM | POA: Insufficient documentation

## 2012-05-18 DIAGNOSIS — M25559 Pain in unspecified hip: Secondary | ICD-10-CM | POA: Insufficient documentation

## 2012-05-18 DIAGNOSIS — I10 Essential (primary) hypertension: Secondary | ICD-10-CM | POA: Insufficient documentation

## 2012-05-18 DIAGNOSIS — E785 Hyperlipidemia, unspecified: Secondary | ICD-10-CM | POA: Insufficient documentation

## 2012-05-18 DIAGNOSIS — R262 Difficulty in walking, not elsewhere classified: Secondary | ICD-10-CM | POA: Insufficient documentation

## 2012-05-18 DIAGNOSIS — M549 Dorsalgia, unspecified: Secondary | ICD-10-CM | POA: Insufficient documentation

## 2012-05-18 DIAGNOSIS — M6281 Muscle weakness (generalized): Secondary | ICD-10-CM | POA: Insufficient documentation

## 2012-05-18 NOTE — Progress Notes (Signed)
Physical Therapy Treatment Patient Details  Name: Mikayla Jenkins MRN: [id number] Date of Birth: 10/21/1945  Today's Date: 05/18/2012 Time: [id number] PT Time Calculation (min): 44 min  Visit#: 2  of 8   Re-eval: 06/14/12 Charges: Therex x 32' Manual x 8'   Authorization: [employer] sent in on 7/29    Subjective: Symptoms/Limitations Symptoms: Pt states that she was having 5/10 radicular pain earlier in the day. She took an ibuprohpen and her pain decresed to 1/10. Pain Assessment Currently in Pain?: Yes Pain Score:   1 Pain Location: Hip Pain Orientation: Right Pain Radiating Towards: R ankle   Exercise/Treatments Stretches Active Hamstring Stretch: 3 reps;30 seconds Single Knee to Chest Stretch: 3 reps;30 seconds Supine Ab Set: 10 reps;5 seconds Bent Knee Raise: 10 reps Bridge: 10 reps;5 seconds Sidelying Clam: 5 reps;Limitations Clam Limitations: 10"holds Prone  Straight Leg Raise: 10 reps  Manual Therapy Manual Therapy: Other (comment) Other Manual Therapy: SI MET L ant R post  Physical Therapy Assessment and Plan PT Assessment and Plan Clinical Impression Statement: Pt completes therex well after multimodal cueing for proper core control. Leg length discrepancy noted. R SI is much farther anterior than L. Pt advised to purchase insert for L shoe. MET completed to correct. PT Plan: Pt to be seen two times a week;  Begin bent knee raise, bridge, clams and prone heel squeezes next treatment.     Problem List Patient Active Problem List  Diagnosis  . ONYCHOMYCOSIS, TOENAILS  . DIABETES MELLITUS, TYPE II  . HYPERLIPIDEMIA  . OVERWEIGHT  . HYPERTENSION  . ALLERGIC RHINITIS, SEASONAL  . ACUTE CYSTITIS  . FATIGUE  . DISORDER OF BONE AND CARTILAGE UNSPECIFIED  . Sciatica of right side  . Difficulty in walking  . Pain in back  . Decreased muscle strength    PT - End of Session Activity Tolerance: Patient tolerated treatment well General Behavior During  Session: [facility] for tasks performed Cognition: [facility] for tasks performed   Rachelle Hora, PTA 05/18/2012, 5:34 PM

## 2012-05-23 ENCOUNTER — Ambulatory Visit (HOSPITAL_COMMUNITY)
Admission: RE | Admit: 2012-05-23 | Discharge: 2012-05-23 | Disposition: A | Discharge status: Home / Self Care | Payer: Medicare HMO | Source: Ambulatory Visit | Attending: Physical Therapy | Admitting: Physical Therapy

## 2012-05-23 DIAGNOSIS — R262 Difficulty in walking, not elsewhere classified: Secondary | ICD-10-CM

## 2012-05-23 DIAGNOSIS — M549 Dorsalgia, unspecified: Secondary | ICD-10-CM

## 2012-05-23 DIAGNOSIS — M6281 Muscle weakness (generalized): Secondary | ICD-10-CM

## 2012-05-23 NOTE — Progress Notes (Signed)
Physical Therapy Treatment Patient Details  Name: Mikayla Jenkins MRN: [id number] Date of Birth: [birth year]  Today's Date: 05/23/2012 Time: 1650-1730 PT Time Calculation (min): 40 min  Visit#: 3  of 8   Re-eval: 06/14/12  Charge: therex 20 min Manual 20 min  Authorization: [employer] sent in on 7/29   Subjective: Symptoms/Limitations Symptoms: Pt stated pain was higher this morning down R LE, pain scale 1/10.  Pt reported feeling better following last session, was ambulating with crutches, entered this session ambulating with quad cane. Pain Assessment Currently in Pain?: Yes Pain Score:   1 Pain Location: Hip Pain Orientation: Right Pain Radiating Towards: R ankle  Objective: pt ambulating with quad cane with lift in L shoe  Exercise/Treatments Stretches Active Hamstring Stretch: 3 reps;30 seconds Standing Other Standing Lumbar Exercises: gait training following MET x 2 RT in dept Supine Ab Set: 10 reps;5 seconds Bent Knee Raise: 10 reps;3 seconds Bridge: 10 reps;5 seconds Sidelying Clam: 5 reps;Limitations Clam Limitations: 10"holds Prone  Straight Leg Raise: 5 seconds Other Prone Lumbar Exercises: Prone heel squeeze 5x 5"  Manual Therapy Manual Therapy: Other (comment) Other Manual Therapy: SI MET for R anterior rotation x 20 min with spinal sculpture for visual aids.  Physical Therapy Assessment and Plan PT Assessment and Plan Clinical Impression Statement: Pt wtht improved gait mechanics noted following manual SI mobilization for right anterior rotation.  Pt with insert for L shoes as advised last session.  Pt able to complete therex well following multimodal cueing for proper core control. PT Plan: Continue with manual MET for correct SI alignment initially at next session, continue progressing core control and LE strengthening.  Begin functional squats when ready.    Goals    Problem List Patient Active Problem List  Diagnosis  . ONYCHOMYCOSIS, TOENAILS  .  DIABETES MELLITUS, TYPE II  . HYPERLIPIDEMIA  . OVERWEIGHT  . HYPERTENSION  . ALLERGIC RHINITIS, SEASONAL  . ACUTE CYSTITIS  . FATIGUE  . DISORDER OF BONE AND CARTILAGE UNSPECIFIED  . Sciatica of right side  . Difficulty in walking  . Pain in back  . Decreased muscle strength    PT - End of Session Activity Tolerance: Patient tolerated treatment well General Behavior During Session: [facility] for tasks performed Cognition: [facility] for tasks performed  GP    Aldona Lento 05/23/2012, 5:29 PM

## 2012-05-25 ENCOUNTER — Ambulatory Visit (HOSPITAL_COMMUNITY)
Admission: RE | Admit: 2012-05-25 | Discharge: 2012-05-25 | Disposition: A | Discharge status: Home / Self Care | Payer: Medicare HMO | Source: Ambulatory Visit | Attending: Family Medicine | Admitting: Family Medicine

## 2012-05-25 DIAGNOSIS — M549 Dorsalgia, unspecified: Secondary | ICD-10-CM

## 2012-05-25 DIAGNOSIS — R262 Difficulty in walking, not elsewhere classified: Secondary | ICD-10-CM

## 2012-05-25 DIAGNOSIS — M6281 Muscle weakness (generalized): Secondary | ICD-10-CM

## 2012-05-25 NOTE — Progress Notes (Signed)
Physical Therapy Treatment Patient Details  Name: MARKESHA MITTLEMAN MRN: [id number] Date of Birth: 16-Nov-1945  Today's Date: 05/25/2012 Time: 1105-1157 PT Time Calculation (min): 52 min  Visit#: 4  of 8   Re-eval: 06/14/12  Charge: therex 32 min Manual 20 min  Authorization: [employer]   Subjective: Symptoms/Limitations Symptoms: Pt stated LBP 1/10 today, still has pain at night been taken pain meds to get to sleep.  Pt does c/o radiating pain down R LE to ankle. Pain Assessment Currently in Pain?: Yes Pain Score:   1 Pain Location: Back Pain Orientation: Right Pain Radiating Towards: R ankle  Objective:   Exercise/Treatments Stretches Active Hamstring Stretch: 3 reps;30 seconds Supine Ab Set: 10 reps;5 seconds Bent Knee Raise: 10 reps;3 seconds Bridge: 15 reps Straight Leg Raise: 10 reps;Limitations Straight Leg Raises Limitations: floating Sidelying Clam: 10 reps;Limitations Clam Limitations: 10"holds Prone  Straight Leg Raise: 5 reps;3 seconds Other Prone Lumbar Exercises: Prone heel squeeze 5x 5"  Manual Therapy Other Manual Therapy: Checked SI alignment beginning of session, SI in alignment. x 5 min.  Sciatric nerve gliding x 15 min to reduce radicular pain R LE.  Physical Therapy Assessment and Plan PT Assessment and Plan Clinical Impression Statement: SI is alignment, no need for MET this session.  Sciatic nerve gliding complete, pt stated no pain in buttocks area and pain reduced R calf/ankle area.  Pt instructed proper technique getting in and out of bed, able to demonstrate without difficulty.  Good contraction palpated with TA following vc for breathing and tactile cueing for correct mm utilized.  Pt c/o increased R LE in prone position, pain reduced when out of position but not eliminated. PT Plan: Check SI alignement beginning of next session, manual nerve gliding if radicular pain.  Continue progress core control and LE strengthening.  Begin supine isometric hip  flexion next session.  Begin functional squats when ready.    Goals    Problem List Patient Active Problem List  Diagnosis  . ONYCHOMYCOSIS, TOENAILS  . DIABETES MELLITUS, TYPE II  . HYPERLIPIDEMIA  . OVERWEIGHT  . HYPERTENSION  . ALLERGIC RHINITIS, SEASONAL  . ACUTE CYSTITIS  . FATIGUE  . DISORDER OF BONE AND CARTILAGE UNSPECIFIED  . Sciatica of right side  . Difficulty in walking  . Pain in back  . Decreased muscle strength    PT - End of Session Activity Tolerance: Patient tolerated treatment well General Behavior During Session: [facility] for tasks performed Cognition: [facility] for tasks performed  GP    Aldona Lento 05/25/2012, 12:00 PM

## 2012-05-29 ENCOUNTER — Ambulatory Visit: Payer: Medicare HMO | Admitting: Family Medicine

## 2012-05-30 ENCOUNTER — Ambulatory Visit (HOSPITAL_COMMUNITY)
Admission: RE | Admit: 2012-05-30 | Discharge: 2012-05-30 | Disposition: A | Discharge status: Home / Self Care | Payer: Medicare HMO | Source: Ambulatory Visit | Attending: Family Medicine | Admitting: Family Medicine

## 2012-05-30 NOTE — Progress Notes (Signed)
Physical Therapy Treatment Patient Details  Name: Mikayla Jenkins MRN: [id number] Date of Birth: Mar 15, 1946  Today's Date: 05/30/2012 Time: [id number] PT Time Calculation (min): 42 min  Visit#: 4  of 8   Re-eval: 06/14/12 Charges: Therex x 30' Manual x 10'  Authorization: [employer]   Subjective: Symptoms/Limitations Symptoms: Pt reports radicular sx form left hip to ankle. Pain Assessment Currently in Pain?: Yes Pain Score:   1 Pain Location: Hip Pain Orientation: Right Pain Radiating Towards: R ankle   Exercise/Treatments Supine Ab Set: 10 reps;5 seconds Bent Knee Raise: 10 reps;3 seconds Bridge: 15 reps Straight Leg Raise: 10 reps;Limitations Straight Leg Raises Limitations: floating Isometric Hip Flexion: 5 reps;5 seconds Sidelying Clam: 10 reps;Limitations Clam Limitations: 10"holds Prone  Other Prone Lumbar Exercises: Prone heel squeeze 5x 5" Other Prone Lumbar Exercises: Prone glute set 10x5" Quadruped    Manual Therapy Other Manual Therapy: Sciatic nerve gliding x 10 min to reduce radicular pain R LE.  Physical Therapy Assessment and Plan PT Assessment and Plan Clinical Impression Statement: Pt completes therex well with minimal need for cueing. Pt has most difficulty with prone exercises. Pt reports pain increase to 10/10 when attempting prone R SLR. Modified to glute set. Nerve glides completed after therex to decrease pain. Pt reports 3/10 pain at end of session. PT Plan: Continue to progress per PT POC.     Problem List Patient Active Problem List  Diagnosis  . ONYCHOMYCOSIS, TOENAILS  . DIABETES MELLITUS, TYPE II  . HYPERLIPIDEMIA  . OVERWEIGHT  . HYPERTENSION  . ALLERGIC RHINITIS, SEASONAL  . ACUTE CYSTITIS  . FATIGUE  . DISORDER OF BONE AND CARTILAGE UNSPECIFIED  . Sciatica of right side  . Difficulty in walking  . Pain in back  . Decreased muscle strength    PT - End of Session Activity Tolerance: Patient tolerated treatment  well General Behavior During Session: [facility] for tasks performed Cognition: [facility] for tasks performed   Rachelle Hora, PTA 05/30/2012, 11:54 AM

## 2012-05-31 ENCOUNTER — Telehealth: Payer: Self-pay | Admitting: Family Medicine

## 2012-05-31 ENCOUNTER — Other Ambulatory Visit: Payer: Self-pay | Admitting: Family Medicine

## 2012-05-31 MED ORDER — HYDROCODONE-ACETAMINOPHEN 5-500 MG PO TABS: ORAL_TABLET | ORAL | Status: DC

## 2012-05-31 NOTE — Telephone Encounter (Signed)
Having back pain and she is doing rehab. Was told she was going to be sent to pain management and its not getting any better. She only has 3 vicodin left. Cant walk with a crutch. Needs more meds for pain and needs to know status of referral

## 2012-05-31 NOTE — Telephone Encounter (Signed)
pls schedule appt for pain clinic  in [place] or [place] pt still has no appt and is in a lot of pain

## 2012-05-31 NOTE — Telephone Encounter (Signed)
Pt has been told of appt per scheduling with Dr Hardin Negus

## 2012-06-01 ENCOUNTER — Ambulatory Visit (HOSPITAL_COMMUNITY)
Admission: RE | Admit: 2012-06-01 | Discharge: 2012-06-01 | Disposition: A | Discharge status: Home / Self Care | Payer: Medicare HMO | Source: Ambulatory Visit | Attending: Family Medicine | Admitting: Family Medicine

## 2012-06-01 NOTE — Progress Notes (Addendum)
Physical Therapy Treatment Patient Details  Name: Mikayla Jenkins MRN: [id number] Date of Birth: Aug 28, 1946  Today's Date: 06/01/2012 Time: [id number] PT Time Calculation (min): 40 min  Visit#: 6  of 8   Re-eval: 06/14/12 Charges: Manual x 8' Therex x 30'  Authorization: [employer]   Subjective: Symptoms/Limitations Symptoms: Pt states that radicular sx are only going to calf level now rather than ankle. Pain Assessment Currently in Pain?: Yes Pain Score:   3 Pain Location: Buttocks Pain Orientation: Right Pain Radiating Towards: R calf   Exercise/Treatments Aerobic Tread Mill: 5'@ 1.4 after MET Standing Heel Raises: 10 reps;Limitations Heel Raises Limitations: Toe raises x 10 Functional Squats: 10 reps Supine Bent Knee Raise: 10 reps;3 seconds Bridge: 20 reps Straight Leg Raise: 10 reps;Limitations Straight Leg Raises Limitations: floating  Manual Therapy Other Manual Therapy: MET to correct R anterior rotation  Physical Therapy Assessment and Plan PT Assessment and Plan Clinical Impression Statement: Pt continues to complete therex with good core control and minimal need for cueing. Pt's radiculopathy seems to be moving proximally. MET completed to correct R anterior rotation. Pt reports 0/10 pain at end of session. PT Plan: Continue to progress per PT POC.     Problem List Patient Active Problem List  Diagnosis  . ONYCHOMYCOSIS, TOENAILS  . DIABETES MELLITUS, TYPE II  . HYPERLIPIDEMIA  . OVERWEIGHT  . HYPERTENSION  . ALLERGIC RHINITIS, SEASONAL  . ACUTE CYSTITIS  . FATIGUE  . DISORDER OF BONE AND CARTILAGE UNSPECIFIED  . Sciatica of right side  . Difficulty in walking  . Pain in back  . Decreased muscle strength    PT - End of Session Activity Tolerance: Patient tolerated treatment well General Behavior During Session: [facility] for tasks performed Cognition: [facility] for tasks performed   Rachelle Hora, PTA 06/01/2012, 2:47 PM

## 2012-06-06 ENCOUNTER — Ambulatory Visit (HOSPITAL_COMMUNITY): Payer: Medicare HMO | Admitting: *Deleted

## 2012-06-08 ENCOUNTER — Ambulatory Visit (HOSPITAL_COMMUNITY): Payer: Medicare HMO

## 2012-08-07 ENCOUNTER — Telehealth: Payer: Self-pay | Admitting: Family Medicine

## 2012-08-07 DIAGNOSIS — I10 Essential (primary) hypertension: Secondary | ICD-10-CM

## 2012-08-07 NOTE — Telephone Encounter (Signed)
Waiting for Dr to sign

## 2012-08-08 ENCOUNTER — Other Ambulatory Visit: Payer: Self-pay

## 2012-08-08 DIAGNOSIS — I10 Essential (primary) hypertension: Secondary | ICD-10-CM

## 2012-08-08 MED ORDER — TRIAMTERENE-HCTZ 75-50 MG PO TABS: 1.0000 | ORAL_TABLET | Freq: Every day | ORAL | Status: DC

## 2012-08-18 LAB — HEMOGLOBIN A1C
Hgb A1c MFr Bld: 6.4 % — ABNORMAL HIGH (ref <5.7)
Mean Plasma Glucose: 137 mg/dL — ABNORMAL HIGH (ref <117)

## 2012-08-19 LAB — LIPID PANEL
Cholesterol: 226 mg/dL — ABNORMAL HIGH (ref 0–200)
Triglycerides: 75 mg/dL (ref <150)
VLDL: 15 mg/dL (ref 0–40)

## 2012-08-19 LAB — COMPLETE METABOLIC PANEL WITH GFR
AST: 18 U/L (ref 0–37)
BUN: 21 mg/dL (ref 6–23)
CO2: 28 mEq/L (ref 19–32)
Calcium: 10 mg/dL (ref 8.4–10.5)
Chloride: 106 mEq/L (ref 96–112)
Creat: 1.2 mg/dL — ABNORMAL HIGH (ref 0.50–1.10)
GFR, Est African American: 54 mL/min — ABNORMAL LOW
GFR, Est Non African American: 47 mL/min — ABNORMAL LOW
Glucose, Bld: 104 mg/dL — ABNORMAL HIGH (ref 70–99)

## 2012-08-22 ENCOUNTER — Other Ambulatory Visit: Payer: Self-pay | Admitting: Family Medicine

## 2012-08-22 DIAGNOSIS — Z139 Encounter for screening, unspecified: Secondary | ICD-10-CM

## 2012-08-23 ENCOUNTER — Ambulatory Visit (INDEPENDENT_AMBULATORY_CARE_PROVIDER_SITE_OTHER): Payer: Medicare HMO | Admitting: Family Medicine

## 2012-08-23 ENCOUNTER — Encounter: Payer: Self-pay | Admitting: Family Medicine

## 2012-08-23 VITALS — BP 140/82 | HR 60 | Resp 15 | Ht 67.0 in | Wt 175.4 lb

## 2012-08-23 DIAGNOSIS — R5381 Other malaise: Secondary | ICD-10-CM

## 2012-08-23 DIAGNOSIS — I10 Essential (primary) hypertension: Secondary | ICD-10-CM

## 2012-08-23 DIAGNOSIS — E663 Overweight: Secondary | ICD-10-CM

## 2012-08-23 DIAGNOSIS — Z23 Encounter for immunization: Secondary | ICD-10-CM

## 2012-08-23 DIAGNOSIS — Z1211 Encounter for screening for malignant neoplasm of colon: Secondary | ICD-10-CM

## 2012-08-23 DIAGNOSIS — R5383 Other fatigue: Secondary | ICD-10-CM

## 2012-08-23 DIAGNOSIS — M5431 Sciatica, right side: Secondary | ICD-10-CM

## 2012-08-23 DIAGNOSIS — E119 Type 2 diabetes mellitus without complications: Secondary | ICD-10-CM

## 2012-08-23 DIAGNOSIS — M5430 Sciatica, unspecified side: Secondary | ICD-10-CM

## 2012-08-23 DIAGNOSIS — E785 Hyperlipidemia, unspecified: Secondary | ICD-10-CM

## 2012-08-23 MED ORDER — OMEPRAZOLE 20 MG PO CPDR: 20.0000 mg | DELAYED_RELEASE_CAPSULE | Freq: Two times a day (BID) | ORAL | Status: DC

## 2012-08-23 MED ORDER — BENAZEPRIL HCL 40 MG PO TABS: ORAL_TABLET | ORAL | Status: DC

## 2012-08-23 MED ORDER — METFORMIN HCL 850 MG PO TABS: ORAL_TABLET | ORAL | Status: DC

## 2012-08-23 MED ORDER — AMLODIPINE BESYLATE 2.5 MG PO TABS: 2.5000 mg | ORAL_TABLET | Freq: Every day | ORAL | Status: DC

## 2012-08-23 NOTE — Progress Notes (Signed)
  Subjective:    Patient ID: Mikayla Jenkins, female    DOB: August 04, 1946, 66 y.o.   MRN: [id number]  HPI The PT is here for follow up and re-evaluation of chronic medical conditions, medication management and review of any available recent lab and radiology data.  Preventive health is updated, specifically  Cancer screening and Immunization.   Questions or concerns regarding consultations or procedures which the PT has had in the interim are  Addressed.Had excellent result from 1 epidural injection for her acute back pain earlier this year The PT denies any adverse reactions to current medications since the last visit.  There are no new concerns.  There are no specific complaints  Has been working on dietary change and now exercises 3 times per week at there [employer], will increase this, enjoys the sessions Blood sugar is checked regularly, and fasting is seldom over 120      Review of Systems See HPI Denies recent fever or chills. Denies sinus pressure, nasal congestion, ear pain or sore throat. Denies chest congestion, productive cough or wheezing. Denies chest pains, palpitations and leg swelling Denies abdominal pain, nausea, vomiting,diarrhea or constipation.   Denies dysuria, frequency, hesitancy or incontinence. Denies joint pain, swelling and limitation in mobility. Denies headaches, seizures, numbness, or tingling. Denies depression, anxiety or insomnia. Denies skin break down or rash.        Objective:   Physical Exam Patient alert and oriented and in no cardiopulmonary distress.  HEENT: No facial asymmetry, EOMI, no sinus tenderness,  oropharynx pink and moist.  Neck supple no adenopathy.  Chest: Clear to auscultation bilaterally.  CVS: S1, S2 no murmurs, no S3.  ABD: Soft non tender. Bowel sounds normal. Rectal: no mass, heme negative stool Ext: No edema  MS: Adequate ROM spine, shoulders, hips and knees.  Skin: Intact, no ulcerations or rash noted.  Psych:  Good eye contact, normal affect. Memory intact not anxious or depressed appearing.  CNS: CN 2-12 intact, power, tone and sensation normal throughout. Diabetic Foot Check:  Appearance - no lesions, ulcers or calluses Skin - no unusual pallor or redness Pulses Left - Dorsalis Pedis and Posterior Tibia normal Right - Dorsalis Pedis and Posterior Tibia normal        Assessment & Plan:

## 2012-08-23 NOTE — Patient Instructions (Addendum)
Annual wellness in 4.5 to 5 month  , please call if you need me before  Fasting lipid, cmp and EGFR and hBa1C before next visit  Flu vaccine today  Rectal exam today  Blood pressure and blood sugar are excellent.  Cholesterol is slightly elevated, cut back on cheese, butter and egg yolks, red meat and fried foods  Pls keep mammogram appt , I am glad that eye exam is good  It is important that you exercise regularly at least 30 minutes 5 times a week. If you develop chest pain, have severe difficulty breathing, or feel very tired, stop exercising immediately and seek medical attention    A healthy diet is rich in fruit, vegetables and whole grains. Poultry fish, nuts and beans are a healthy choice for protein rather then red meat. A low sodium diet and drinking 64 ounces of water daily is generally recommended. Oils and sweet should be limited. Carbohydrates especially for those who are diabetic or overweight, should be limited to 30-45 gram per meal. It is important to eat on a regular schedule, at least 3 times daily. Snacks should be primarily fruits, vegetables or nuts.  Please log into your labs and radiology reports at home we will give you info to do this

## 2012-08-25 LAB — POC HEMOCCULT BLD/STL (OFFICE/1-CARD/DIAGNOSTIC): Fecal Occult Blood, POC: NEGATIVE

## 2012-08-25 NOTE — Assessment & Plan Note (Addendum)
UnControlled, no change in medication Hyperlipidemia:Low fat diet discussed and encouraged.

## 2012-08-25 NOTE — Addendum Note (Signed)
Addended by: Eual Fines on: 08/25/2012 08:14 AM   Modules accepted: Orders

## 2012-08-25 NOTE — Assessment & Plan Note (Signed)
Marked improvement with epidural

## 2012-08-25 NOTE — Assessment & Plan Note (Signed)
Improved. Pt applauded on succesful weight loss through lifestyle change, and encouraged to continue same. Weight loss goal set for the next several months.  

## 2012-08-25 NOTE — Assessment & Plan Note (Signed)
Fair control, no med change, opt to increase physical activity and following a low sodium diet, and weight loss

## 2012-08-28 ENCOUNTER — Telehealth: Payer: Self-pay | Admitting: Family Medicine

## 2012-08-28 NOTE — Telephone Encounter (Signed)
wants you to call today urgent

## 2012-08-28 NOTE — Telephone Encounter (Signed)
i spoke directly with pt since there is no documented record release to speak with her daughter I am speaking with pt. Concerns are that pt has episodes of excessive fatigue c/o excess urination espesciay at night with  maxzide dose, pt advised to reduce maxzide to half the prescribed dose espescially since she reports much lower blood pressures outside of the office. SDhe is to monitor blood pressure and symptoms at home

## 2012-08-29 ENCOUNTER — Telehealth: Payer: Self-pay | Admitting: Family Medicine

## 2012-08-29 ENCOUNTER — Other Ambulatory Visit: Payer: Self-pay | Admitting: Family Medicine

## 2012-08-29 DIAGNOSIS — I10 Essential (primary) hypertension: Secondary | ICD-10-CM

## 2012-08-29 DIAGNOSIS — E119 Type 2 diabetes mellitus without complications: Secondary | ICD-10-CM

## 2012-08-29 MED ORDER — AMLODIPINE BESYLATE 2.5 MG PO TABS: 2.5000 mg | ORAL_TABLET | Freq: Every day | ORAL | Status: DC

## 2012-08-29 NOTE — Telephone Encounter (Signed)
Spoke directly with patient's daughter [name] , pt to  Stop maxzide 50mg  tablet as of tomorrow. She needs to start amlodipine 2.5mg  TWO every evening starting tomorrow.  Chem 7 and eGFR non fasting in 3 weeks.  Office visit needs to be scheduled for 3 weeks  Ok for Mom to take home blood pressure weekly, and call with concerns  Daughter aware and I will directly tell her Mom

## 2012-08-29 NOTE — Telephone Encounter (Signed)
Please order non fasting chem 7 and EGFr for pt to have in 3 weeks, let her know to pick uop. Also remind her to schedule f/u appt with me in 3 weeks

## 2012-08-30 NOTE — Telephone Encounter (Signed)
Lab ordered.

## 2012-08-30 NOTE — Telephone Encounter (Signed)
meds faxed in order faxed to lab

## 2012-08-31 ENCOUNTER — Ambulatory Visit (HOSPITAL_COMMUNITY)
Admission: RE | Admit: 2012-08-31 | Discharge: 2012-08-31 | Disposition: A | Discharge status: Home / Self Care | Payer: Medicare HMO | Source: Ambulatory Visit | Attending: Family Medicine | Admitting: Family Medicine

## 2012-08-31 DIAGNOSIS — Z1231 Encounter for screening mammogram for malignant neoplasm of breast: Secondary | ICD-10-CM | POA: Insufficient documentation

## 2012-08-31 DIAGNOSIS — Z139 Encounter for screening, unspecified: Secondary | ICD-10-CM

## 2012-09-21 ENCOUNTER — Telehealth: Payer: Self-pay | Admitting: Family Medicine

## 2012-09-21 NOTE — Telephone Encounter (Signed)
Mailed lab order.

## 2012-09-25 ENCOUNTER — Other Ambulatory Visit: Payer: Self-pay | Admitting: Family Medicine

## 2012-09-25 ENCOUNTER — Other Ambulatory Visit: Payer: Self-pay

## 2012-09-25 DIAGNOSIS — I10 Essential (primary) hypertension: Secondary | ICD-10-CM

## 2012-09-26 LAB — COMPLETE METABOLIC PANEL WITH GFR
ALT: 12 U/L (ref 0–35)
Albumin: 4.4 g/dL (ref 3.5–5.2)
CO2: 29 mEq/L (ref 19–32)
Calcium: 9.5 mg/dL (ref 8.4–10.5)
Chloride: 100 mEq/L (ref 96–112)
Creat: 1.18 mg/dL — ABNORMAL HIGH (ref 0.50–1.10)
GFR, Est African American: 55 mL/min — ABNORMAL LOW
Potassium: 4.2 mEq/L (ref 3.5–5.3)
Total Protein: 6.6 g/dL (ref 6.0–8.3)

## 2012-09-28 ENCOUNTER — Encounter: Payer: Self-pay | Admitting: Family Medicine

## 2012-09-28 ENCOUNTER — Ambulatory Visit (INDEPENDENT_AMBULATORY_CARE_PROVIDER_SITE_OTHER): Payer: Medicare HMO | Admitting: Family Medicine

## 2012-09-28 VITALS — BP 178/88 | HR 70 | Resp 18 | Ht 67.0 in | Wt 179.1 lb

## 2012-09-28 DIAGNOSIS — N3000 Acute cystitis without hematuria: Secondary | ICD-10-CM

## 2012-09-28 DIAGNOSIS — E119 Type 2 diabetes mellitus without complications: Secondary | ICD-10-CM

## 2012-09-28 DIAGNOSIS — E785 Hyperlipidemia, unspecified: Secondary | ICD-10-CM

## 2012-09-28 DIAGNOSIS — I10 Essential (primary) hypertension: Secondary | ICD-10-CM

## 2012-09-28 LAB — POCT URINALYSIS DIPSTICK
Bilirubin, UA: NEGATIVE
Blood, UA: NEGATIVE
Glucose, UA: NEGATIVE
Ketones, UA: NEGATIVE
Nitrite, UA: NEGATIVE
pH, UA: 6.5

## 2012-09-28 MED ORDER — AMLODIPINE BESYLATE 10 MG PO TABS: 10.0000 mg | ORAL_TABLET | Freq: Every day | ORAL | Status: DC

## 2012-09-28 NOTE — Progress Notes (Signed)
  Subjective:    Patient ID: Mikayla Jenkins, female    DOB: Aug 15, 1946, 66 y.o.   MRN: [id number]  HPI Pt in for re evaluation of uncontrolled blood pressure, with her cuff and recordings. Cuff is accurate, testing needs to be improved. Her blood pressure is still markedly elevated, she is concerned about this and wants to do whatever it takes to normalize her blood pressure. She is invested in lifestyle change  Labs have not changed significantly with  Med change Blood sugars remain well controlled with fasting sugars seldom over 120   Review of Systems See HPI Denies recent fever or chills. Denies sinus pressure, nasal congestion, ear pain or sore throat. Denies chest congestion, productive cough or wheezing. Denies chest pains, palpitations and leg swelling Denies abdominal pain, nausea, vomiting,diarrhea or constipation.   Mild dysuria and frequency on an intermittent basis for weeks. Denies joint pain, swelling and limitation in mobility. Denies headaches, seizures, numbness, or tingling. Denies depression, anxiety or insomnia. Denies skin break down or rash.        Objective:   Physical Exam  Patient alert and oriented and in no cardiopulmonary distress.  HEENT: No facial asymmetry, EOMI, no sinus tenderness,  oropharynx pink and moist.  Neck supple no adenopathy.  Chest: Clear to auscultation bilaterally.  CVS: S1, S2 no murmurs, no S3.  ABD: Soft non tender. Bowel sounds normal.No renal angle or suprapubic tenderness  Ext: No edema  MS: Adequate ROM spine, shoulders, hips and knees.  Skin: Intact, no ulcerations or rash noted.  Psych: Good eye contact, normal affect. Memory intact not anxious or depressed appearing.  CNS: CN 2-12 intact, power, tone and sensation normal throughout.       Assessment & Plan:

## 2012-09-28 NOTE — Patient Instructions (Addendum)
F/u 2nd week in February.Please call if you need me before  Blood pressure is too high, increase amlodipine from 5 mg to 10mg  starting tonight, take four 2.5mg  at 8pm every night. New script is for 10mg  one at night  Continue the other blood pressure medication as before  Fasting lipid, cmp and EGfr and HBa1C Feb 4 or after  Your urine is being checked for infection,antibiotic will be sent to your local pharmacy if appears infected

## 2012-09-30 LAB — URINE CULTURE

## 2012-09-30 NOTE — Assessment & Plan Note (Signed)
Controlled, no change in medication Patient advised to reduce carb and sweets, commit to regular physical activity, take meds as prescribed, test blood as directed, and attempt to lose weight, to improve blood sugar control.  

## 2012-09-30 NOTE — Assessment & Plan Note (Signed)
Uncontrolled. Dose increase in amlodipine DASH diet and commitment to daily physical activity for a minimum of 30 minutes discussed and encouraged, as a part of hypertension management. The importance of attaining a healthy weight is also discussed.

## 2012-09-30 NOTE — Assessment & Plan Note (Signed)
Deteriorated and uncontrolled Hyperlipidemia:Low fat diet discussed and encouraged.  Updated lab next visit

## 2012-09-30 NOTE — Assessment & Plan Note (Signed)
Chronic intermittent frequency and dysuria. UA in office abn, will await sensitivity and culture before treating if needed

## 2012-11-23 LAB — LIPID PANEL
LDL Cholesterol: 84 mg/dL (ref 0–99)
Triglycerides: 73 mg/dL (ref <150)
VLDL: 15 mg/dL (ref 0–40)

## 2012-11-23 LAB — COMPLETE METABOLIC PANEL WITH GFR
AST: 17 U/L (ref 0–37)
Albumin: 4.4 g/dL (ref 3.5–5.2)
Alkaline Phosphatase: 69 U/L (ref 39–117)
Potassium: 4 mEq/L (ref 3.5–5.3)
Sodium: 139 mEq/L (ref 135–145)
Total Bilirubin: 0.7 mg/dL (ref 0.3–1.2)
Total Protein: 6.6 g/dL (ref 6.0–8.3)

## 2012-11-23 LAB — HEMOGLOBIN A1C
Hgb A1c MFr Bld: 6.4 % — ABNORMAL HIGH (ref <5.7)
Mean Plasma Glucose: 137 mg/dL — ABNORMAL HIGH (ref <117)

## 2012-11-29 ENCOUNTER — Encounter: Payer: Self-pay | Admitting: Family Medicine

## 2012-11-29 ENCOUNTER — Ambulatory Visit (INDEPENDENT_AMBULATORY_CARE_PROVIDER_SITE_OTHER): Payer: Medicare HMO | Admitting: Family Medicine

## 2012-11-29 VITALS — BP 170/90 | HR 88 | Resp 16 | Ht 67.0 in | Wt 176.0 lb

## 2012-11-29 DIAGNOSIS — M25559 Pain in unspecified hip: Secondary | ICD-10-CM

## 2012-11-29 DIAGNOSIS — I10 Essential (primary) hypertension: Secondary | ICD-10-CM

## 2012-11-29 DIAGNOSIS — R5381 Other malaise: Secondary | ICD-10-CM

## 2012-11-29 DIAGNOSIS — E785 Hyperlipidemia, unspecified: Secondary | ICD-10-CM

## 2012-11-29 DIAGNOSIS — E663 Overweight: Secondary | ICD-10-CM

## 2012-11-29 DIAGNOSIS — E119 Type 2 diabetes mellitus without complications: Secondary | ICD-10-CM

## 2012-11-29 DIAGNOSIS — M25551 Pain in right hip: Secondary | ICD-10-CM

## 2012-11-29 MED ORDER — HYDROCODONE-ACETAMINOPHEN 5-300 MG PO TABS: ORAL_TABLET | ORAL | Status: DC

## 2012-11-29 MED ORDER — METFORMIN HCL 850 MG PO TABS: ORAL_TABLET | ORAL | Status: DC

## 2012-11-29 MED ORDER — CLONIDINE HCL 0.2 MG PO TABS: ORAL_TABLET | ORAL | Status: DC

## 2012-11-29 MED ORDER — ATENOLOL-CHLORTHALIDONE 50-25 MG PO TABS: 1.0000 | ORAL_TABLET | Freq: Every day | ORAL | Status: DC

## 2012-11-29 NOTE — Assessment & Plan Note (Signed)
Controlled, no change in medication Patient advised to reduce carb and sweets, commit to regular physical activity, take meds as prescribed, test blood as directed, and attempt to lose weight, to improve blood sugar control.  

## 2012-11-29 NOTE — Assessment & Plan Note (Signed)
Controlled, no change in medication Improved. Hyperlipidemia:Low fat diet discussed and encouraged.

## 2012-11-29 NOTE — Assessment & Plan Note (Signed)
Improved. Pt applauded on succesful weight loss through lifestyle change, and encouraged to continue same. Weight loss goal set for the next several months.  

## 2012-11-29 NOTE — Assessment & Plan Note (Signed)
Uncontrolled, add clonidine at bedtime DASH diet and commitment to daily physical activity for a minimum of 30 minutes discussed and encouraged, as a part of hypertension management. The importance of attaining a healthy weight is also discussed.

## 2012-11-29 NOTE — Assessment & Plan Note (Signed)
Intermittent right hip pain limited amt of vicodin prescribed

## 2012-11-29 NOTE — Progress Notes (Signed)
  Subjective:    Patient ID: Mikayla Jenkins, female    DOB: [birth year], 67 y.o.   MRN: [id number]  HPI The PT is here for follow up and re-evaluation of chronic medical conditions, medication management and review of any available recent lab and radiology data.  Preventive health is updated, specifically  Cancer screening and Immunization.   Questions or concerns regarding consultations or procedures which the PT has had in the interim are  addressed. The PT denies any adverse reactions to current medications since the last visit.  There are no new concerns.  There are no specific complaints       Review of Systems See HPI Denies recent fever or chills. Denies sinus pressure, nasal congestion, ear pain or sore throat. Denies chest congestion, productive cough or wheezing. Denies chest pains, palpitations and leg swelling Denies abdominal pain, nausea, vomiting,diarrhea or constipation.   Denies dysuria, frequency, hesitancy or incontinence. Denies joint pain, swelling and limitation in mobility. Denies headaches, seizures, numbness, or tingling. Denies depression, anxiety or insomnia. Denies skin break down or rash.        Objective:   Physical Exam  Patient alert and oriented and in no cardiopulmonary distress.  HEENT: No facial asymmetry, EOMI, no sinus tenderness,  oropharynx pink and moist.  Neck supple no adenopathy.  Chest: Clear to auscultation bilaterally.  CVS: S1, S2 no murmurs, no S3.  ABD: Soft non tender. Bowel sounds normal.  Ext: No edema  MS: Adequate ROM spine, shoulders, hips and knees.  Skin: Intact, no ulcerations or rash noted.  Psych: Good eye contact, normal affect. Memory intact not anxious or depressed appearing.  CNS: CN 2-12 intact, power, tone and sensation normal throughout.       Assessment & Plan:

## 2012-11-29 NOTE — Patient Instructions (Addendum)
Annual wellness in 4.5 month, cancel sooner appointment please.  Call if you need me before  Congrats on improved labs.  Congrats on regular exercise which you enjoy.  Congrats on healthier weight  Blood pressure is still high, additional medication, clonidine 0.2mg  one at bedtime is sent in.  Pain medication requested is sent to [facility], use only for uncontrolled hip pain, has sedative side effect  Non fasting CBC, chem 7 and EGFr, hBA1C and TSH in 4.5 month

## 2013-01-24 ENCOUNTER — Ambulatory Visit (INDEPENDENT_AMBULATORY_CARE_PROVIDER_SITE_OTHER): Payer: Medicare HMO | Admitting: Family Medicine

## 2013-01-24 ENCOUNTER — Telehealth: Payer: Self-pay | Admitting: Family Medicine

## 2013-01-24 ENCOUNTER — Ambulatory Visit: Payer: Medicare HMO

## 2013-01-24 ENCOUNTER — Encounter: Payer: Self-pay | Admitting: Family Medicine

## 2013-01-24 VITALS — BP 174/82 | HR 72 | Resp 16 | Wt 174.0 lb

## 2013-01-24 DIAGNOSIS — E119 Type 2 diabetes mellitus without complications: Secondary | ICD-10-CM

## 2013-01-24 DIAGNOSIS — I10 Essential (primary) hypertension: Secondary | ICD-10-CM

## 2013-01-24 DIAGNOSIS — D171 Benign lipomatous neoplasm of skin and subcutaneous tissue of trunk: Secondary | ICD-10-CM

## 2013-01-24 DIAGNOSIS — D1739 Benign lipomatous neoplasm of skin and subcutaneous tissue of other sites: Secondary | ICD-10-CM

## 2013-01-24 NOTE — Patient Instructions (Signed)
F/u as before.  You are referred to general surgeon at [facility] in [place], soonest available , to eval and treat the growth on your abdominal wall.  Clinically  I believe this is a lipoma , which is not cancerous

## 2013-01-24 NOTE — Assessment & Plan Note (Signed)
Clinically enlarging lipoma LUQ, refer to gen surgery

## 2013-01-24 NOTE — Telephone Encounter (Signed)
Wants Dr to look at a knot on her side. I told pt that required an office visit

## 2013-01-24 NOTE — Telephone Encounter (Signed)
Luann to schedule

## 2013-01-24 NOTE — Progress Notes (Signed)
  Subjective:    Patient ID: Mikayla Jenkins, female    DOB: 07-Sep-1946, 67 y.o.   MRN: [id number]  HPI 1 year h/o swelling on left upper abdomen increased in size, very concerned as family friend recently diagnosed with cancer sounding similar to what she has. States initially she had been disregarding it butnow anxious and overwhelmed, wants to know what to do Reports normal blood pressures at home   Review of Systems See HPI Denies recent fever or chills. Denies sinus pressure, nasal congestion, ear pain or sore throat. Denies chest congestion, productive cough or wheezing. Denies chest pains, palpitations and leg swelling  Increased anxiety with regard to the swelling. Denies skin break down or rash.        Objective:   Physical Exam  Patient alert and oriented and in no cardiopulmonary distress.Anxious and tearful  HEENT: No facial asymmetry, EOMI, no sinus tenderness,  oropharynx pink and moist.  Neck supple no adenopathy.  Chest: Clear to auscultation bilaterally.  CVS: S1, S2 no murmurs, no S3.  ABD: Soft non tender. Bowel sounds normal.  Ext: No edema  Skin" fatty tumor , approx 3in diameter in LUQ, mildly tender to deep palpation, no eryhtema or warmth.       Assessment & Plan:

## 2013-01-24 NOTE — Assessment & Plan Note (Signed)
Controlled, no change in medication Patient advised to reduce carb and sweets, commit to regular physical activity, take meds as prescribed, test blood as directed, and attempt to lose weight, to improve blood sugar control.  

## 2013-01-24 NOTE — Assessment & Plan Note (Signed)
Elevated , however , pt visibly anxious and tearful, no med changes at this time DASH diet and commitment to daily physical activity for a minimum of 30 minutes discussed and encouraged, as a part of hypertension management. The importance of attaining a healthy weight is also discussed.

## 2013-02-05 ENCOUNTER — Encounter (INDEPENDENT_AMBULATORY_CARE_PROVIDER_SITE_OTHER): Payer: Self-pay | Admitting: General Surgery

## 2013-02-05 ENCOUNTER — Ambulatory Visit (INDEPENDENT_AMBULATORY_CARE_PROVIDER_SITE_OTHER): Payer: Medicare HMO | Admitting: General Surgery

## 2013-02-05 VITALS — BP 122/70 | HR 61 | Temp 98.2°F | Resp 18 | Ht 67.5 in | Wt 172.0 lb

## 2013-02-05 DIAGNOSIS — D1739 Benign lipomatous neoplasm of skin and subcutaneous tissue of other sites: Secondary | ICD-10-CM

## 2013-02-05 DIAGNOSIS — D171 Benign lipomatous neoplasm of skin and subcutaneous tissue of trunk: Secondary | ICD-10-CM

## 2013-02-05 NOTE — Patient Instructions (Signed)
Call if you decide to have it removed

## 2013-02-05 NOTE — Progress Notes (Signed)
Subjective:     Patient ID: Mikayla Jenkins, female   DOB: 07/24/1946, 67 y.o.   MRN: [id number]  HPI We're asked to see the patient in consultation by Dr. Moshe Cipro to evaluate her for a lipoma on her abdominal wall. The patient is a 67 year old black female who has had a small lump on her left upper abdominal or lower chest wall for about the last 2 years. She feels as though it may of gotten slightly larger during that time. It does not cause her any pain.  Review of Systems  Constitutional: Negative.   HENT: Negative.   Eyes: Negative.   Respiratory: Negative.   Cardiovascular: Negative.   Gastrointestinal: Negative.   Endocrine: Negative.   Genitourinary: Negative.   Musculoskeletal: Negative.   Skin: Negative.   Allergic/Immunologic: Negative.   Neurological: Negative.   Hematological: Negative.   Psychiatric/Behavioral: Negative.        Objective:   Physical Exam  Constitutional: She is oriented to person, place, and time. She appears well-developed and well-nourished.  HENT:  Head: Normocephalic and atraumatic.  Eyes: Conjunctivae and EOM are normal. Pupils are equal, round, and reactive to light.  Neck: Normal range of motion. Neck supple.  Cardiovascular: Normal rate, regular rhythm and normal heart sounds.   Pulmonary/Chest: Effort normal and breath sounds normal.  Abdominal: Soft. Bowel sounds are normal. There is no tenderness.  There is a small fatty feeling mass on the upper abdominal or lower chest wall on the left. It feels as though it is approximately 3 cm in diameter. It is mobile and not tethered to the chest wall  Musculoskeletal: Normal range of motion.  Neurological: She is alert and oriented to person, place, and time.  Skin: Skin is warm and dry.  Psychiatric: She has a normal mood and affect. Her behavior is normal.       Assessment:     The patient has what appears to be a lipoma of her left lower chest wall. At this point since she is asymptomatic I  think we could do one of 2 things. One is that we could remove this for her. The second is that we could watch it and see how she does.     Plan:     The patient would like to watch the area and see if it changes at all. We will plan to see her back in about 6 months to reexamine her.

## 2013-02-27 ENCOUNTER — Other Ambulatory Visit: Payer: Self-pay

## 2013-02-27 MED ORDER — BENAZEPRIL HCL 40 MG PO TABS: ORAL_TABLET | ORAL | Status: DC

## 2013-02-27 MED ORDER — METFORMIN HCL 850 MG PO TABS: ORAL_TABLET | ORAL | Status: DC

## 2013-03-29 ENCOUNTER — Ambulatory Visit: Payer: Medicare HMO | Admitting: Family Medicine

## 2013-04-12 LAB — COMPLETE METABOLIC PANEL WITH GFR
AST: 16 U/L (ref 0–37)
Alkaline Phosphatase: 78 U/L (ref 39–117)
BUN: 16 mg/dL (ref 6–23)
Calcium: 9.8 mg/dL (ref 8.4–10.5)
Creat: 1.05 mg/dL (ref 0.50–1.10)
Total Bilirubin: 0.8 mg/dL (ref 0.3–1.2)

## 2013-04-12 LAB — CBC WITH DIFFERENTIAL/PLATELET
Basophils Absolute: 0 10*3/uL (ref 0.0–0.1)
Basophils Relative: 0 % (ref 0–1)
Eosinophils Relative: 7 % — ABNORMAL HIGH (ref 0–5)
HCT: 35.7 % — ABNORMAL LOW (ref 36.0–46.0)
MCHC: 33.6 g/dL (ref 30.0–36.0)
MCV: 89.7 fL (ref 78.0–100.0)
Monocytes Absolute: 0.5 10*3/uL (ref 0.1–1.0)
Platelets: 255 10*3/uL (ref 150–400)
RDW: 13.5 % (ref 11.5–15.5)

## 2013-04-12 LAB — TSH: TSH: 1.629 u[IU]/mL (ref 0.350–4.500)

## 2013-04-12 LAB — HEMOGLOBIN A1C
Hgb A1c MFr Bld: 6.1 % — ABNORMAL HIGH (ref <5.7)
Mean Plasma Glucose: 128 mg/dL — ABNORMAL HIGH (ref <117)

## 2013-04-18 ENCOUNTER — Encounter: Payer: Self-pay | Admitting: Family Medicine

## 2013-04-18 ENCOUNTER — Ambulatory Visit (INDEPENDENT_AMBULATORY_CARE_PROVIDER_SITE_OTHER): Payer: Medicare HMO | Admitting: Family Medicine

## 2013-04-18 VITALS — BP 150/76 | HR 60 | Resp 18 | Ht 67.0 in | Wt 173.0 lb

## 2013-04-18 DIAGNOSIS — I10 Essential (primary) hypertension: Secondary | ICD-10-CM

## 2013-04-18 DIAGNOSIS — E119 Type 2 diabetes mellitus without complications: Secondary | ICD-10-CM

## 2013-04-18 DIAGNOSIS — E663 Overweight: Secondary | ICD-10-CM

## 2013-04-18 DIAGNOSIS — E785 Hyperlipidemia, unspecified: Secondary | ICD-10-CM

## 2013-04-18 MED ORDER — METFORMIN HCL 500 MG PO TABS: 500.0000 mg | ORAL_TABLET | Freq: Every day | ORAL | Status: DC

## 2013-04-18 NOTE — Patient Instructions (Addendum)
F/u November 4 or after, call if you need me before  Labs are excellent  REDUCE dose of metformin to 500mg  once daily (OK to finish current tabs you have)   Fasting lipid, cmp and EGFR, hBA1C and microalb Nov 2 or after  It is important that you exercise regularly at least 30 minutes 5 times a week. If you develop chest pain, have severe difficulty breathing, or feel very tired, stop exercising immediately and seek medical attention    Pls schedule eye exam for October 23 or after, call if you need help with referral

## 2013-04-29 NOTE — Progress Notes (Signed)
  Subjective:    Patient ID: Mikayla Jenkins, female    DOB: 26-Aug-1946, 67 y.o.   MRN: [id number]  HPI The PT is here for follow up and re-evaluation of chronic medical conditions, medication management and review of any available recent lab and radiology data.  Preventive health is updated, specifically  Cancer screening and Immunization.   Questions or concerns regarding consultations or procedures which the PT has had in the interim are  addressed. The PT denies any adverse reactions to current medications since the last visit.  There are no new concerns.  There are no specific complaints       Review of Systems See HPI Denies recent fever or chills. Denies sinus pressure, nasal congestion, ear pain or sore throat. Denies chest congestion, productive cough or wheezing. Denies chest pains, palpitations and leg swelling Denies abdominal pain, nausea, vomiting,diarrhea or constipation.   Denies dysuria, frequency, hesitancy or incontinence. Denies joint pain, swelling and limitation in mobility. Denies headaches, seizures, numbness, or tingling. Denies depression, anxiety or insomnia. Denies skin break down or rash.        Objective:   Physical Exam  Patient alert and oriented and in no cardiopulmonary distress.  HEENT: No facial asymmetry, EOMI, no sinus tenderness,  oropharynx pink and moist.  Neck supple no adenopathy.  Chest: Clear to auscultation bilaterally.  CVS: S1, S2 no murmurs, no S3.  ABD: Soft non tender. Bowel sounds normal.  Ext: No edema  MS: Adequate ROM spine, shoulders, hips and knees.  Skin: Intact, no ulcerations or rash noted.  Psych: Good eye contact, normal affect. Memory intact not anxious or depressed appearing.  CNS: CN 2-12 intact, power, tone and sensation normal throughout.       Assessment & Plan:

## 2013-04-29 NOTE — Assessment & Plan Note (Signed)
Controlled, decrease dose of med, may be able to taper off entirely Patient advised to reduce carb and sweets, commit to regular physical activity, take meds as prescribed, test blood as directed, and attempt to lose weight, to improve blood sugar control.

## 2013-04-29 NOTE — Assessment & Plan Note (Signed)
Unchanged. Patient re-educated about  the importance of commitment to a  minimum of 150 minutes of exercise per week. The importance of healthy food choices with portion control discussed. Encouraged to start a food diary, count calories and to consider  joining a support group. Sample diet sheets offered. Goals set by the patient for the next several months.    

## 2013-04-29 NOTE — Assessment & Plan Note (Signed)
Adequate control, though sub optmal, there is an element of white coat HTN in pt No med change DASH diet and commitment to daily physical activity for a minimum of 30 minutes discussed and encouraged, as a part of hypertension management. The importance of attaining a healthy weight is also discussed.

## 2013-04-29 NOTE — Assessment & Plan Note (Signed)
Controlled, no change in medication Hyperlipidemia:Low fat diet discussed and encouraged.  \ 

## 2013-08-08 ENCOUNTER — Other Ambulatory Visit: Payer: Self-pay

## 2013-08-08 DIAGNOSIS — E785 Hyperlipidemia, unspecified: Secondary | ICD-10-CM

## 2013-08-08 MED ORDER — LOVASTATIN 40 MG PO TABS
40.0000 mg | ORAL_TABLET | Freq: Every day | ORAL | Status: DC
Start: 2013-08-08 — End: 2013-12-27

## 2013-08-10 ENCOUNTER — Ambulatory Visit (INDEPENDENT_AMBULATORY_CARE_PROVIDER_SITE_OTHER): Payer: Medicare HMO

## 2013-08-10 DIAGNOSIS — Z23 Encounter for immunization: Secondary | ICD-10-CM

## 2013-08-21 LAB — COMPLETE METABOLIC PANEL WITH GFR
Albumin: 4.3 g/dL (ref 3.5–5.2)
Alkaline Phosphatase: 78 U/L (ref 39–117)
BUN: 15 mg/dL (ref 6–23)
Calcium: 10 mg/dL (ref 8.4–10.5)
Chloride: 102 mEq/L (ref 96–112)
GFR, Est African American: 71 mL/min
GFR, Est Non African American: 62 mL/min
Glucose, Bld: 121 mg/dL — ABNORMAL HIGH (ref 70–99)
Potassium: 3.9 mEq/L (ref 3.5–5.3)

## 2013-08-21 LAB — LIPID PANEL
Cholesterol: 193 mg/dL (ref 0–200)
Total CHOL/HDL Ratio: 2.1 Ratio

## 2013-08-21 LAB — HEMOGLOBIN A1C: Hgb A1c MFr Bld: 6.7 % — ABNORMAL HIGH (ref <5.7)

## 2013-08-22 ENCOUNTER — Other Ambulatory Visit: Payer: Self-pay | Admitting: Family Medicine

## 2013-08-22 DIAGNOSIS — Z139 Encounter for screening, unspecified: Secondary | ICD-10-CM

## 2013-08-27 ENCOUNTER — Encounter: Payer: Self-pay | Admitting: Family Medicine

## 2013-08-27 ENCOUNTER — Ambulatory Visit (INDEPENDENT_AMBULATORY_CARE_PROVIDER_SITE_OTHER): Payer: Medicare HMO | Admitting: Family Medicine

## 2013-08-27 VITALS — BP 148/80 | HR 66 | Resp 16 | Ht 67.0 in | Wt 174.4 lb

## 2013-08-27 DIAGNOSIS — E663 Overweight: Secondary | ICD-10-CM

## 2013-08-27 DIAGNOSIS — Z13820 Encounter for screening for osteoporosis: Secondary | ICD-10-CM

## 2013-08-27 DIAGNOSIS — E785 Hyperlipidemia, unspecified: Secondary | ICD-10-CM

## 2013-08-27 DIAGNOSIS — I10 Essential (primary) hypertension: Secondary | ICD-10-CM

## 2013-08-27 DIAGNOSIS — M25559 Pain in unspecified hip: Secondary | ICD-10-CM

## 2013-08-27 DIAGNOSIS — E119 Type 2 diabetes mellitus without complications: Secondary | ICD-10-CM

## 2013-08-27 DIAGNOSIS — M25551 Pain in right hip: Secondary | ICD-10-CM

## 2013-08-27 DIAGNOSIS — M899 Disorder of bone, unspecified: Secondary | ICD-10-CM

## 2013-08-27 MED ORDER — CLONIDINE HCL 0.3 MG PO TABS: ORAL_TABLET | ORAL | Status: DC

## 2013-08-27 MED ORDER — HYDROCODONE-ACETAMINOPHEN 5-325 MG PO TABS: 1.0000 | ORAL_TABLET | Freq: Every day | ORAL | Status: DC | PRN

## 2013-08-27 MED ORDER — METFORMIN HCL 500 MG PO TABS: 500.0000 mg | ORAL_TABLET | Freq: Every day | ORAL | Status: DC

## 2013-08-27 NOTE — Patient Instructions (Addendum)
F/u in 4 month, please call if you need me before  Metformin will be [facility] with 3 refills  Increase clonidine to one and a half tablets daily please as blood pressure is still elevated. Your pharmacy  Is being notified today to start 0.3mg  clonidine one at night when next due  Labs are excellent   You are referred for a  Bone density test, stop at front for appt info please   HBa1C, chem 7 and EGFR and vit D non fasting in 4 month

## 2013-08-27 NOTE — Progress Notes (Signed)
  Subjective:    Patient ID: Mikayla Jenkins, female    DOB: August 06, 1946, 67 y.o.   MRN: [id number]  HPI The PT is here for follow up and re-evaluation of chronic medical conditions, medication management and review of any available recent lab and radiology data.  Preventive health is updated, specifically  Cancer screening and Immunization.   Questions or concerns regarding consultations or procedures which the PT has had in the interim are  addressed. The PT denies any adverse reactions to current medications since the last visit.  There are no new concerns. Blood sugar fasting is seldom over 125, denies hypoglycemic episodes, polyuria or polydipsia There are no specific complaints       Review of Systems See HPI Denies recent fever or chills. Denies sinus pressure, nasal congestion, ear pain or sore throat. Denies chest congestion, productive cough or wheezing. Denies chest pains, palpitations and leg swelling Denies abdominal pain, nausea, vomiting,diarrhea or constipation.   Denies dysuria, frequency, hesitancy or incontinence. Denies joint pain, swelling and limitation in mobility. Denies headaches, seizures, numbness, or tingling. Denies depression, anxiety or insomnia. Denies skin break down or rash.        Objective:   Physical Exam Patient alert and oriented and in no cardiopulmonary distress.  HEENT: No facial asymmetry, EOMI, no sinus tenderness,  oropharynx pink and moist.  Neck supple no adenopathy.  Chest: Clear to auscultation bilaterally.  CVS: S1, S2 no murmurs, no S3.  ABD: Soft non tender. Bowel sounds normal.  Ext: No edema  MS: Adequate ROM spine, shoulders, hips and knees.  Skin: Intact, no ulcerations or rash noted.  Psych: Good eye contact, normal affect. Memory intact not anxious or depressed appearing.  CNS: CN 2-12 intact, power, tone and sensation normal throughout.        Assessment & Plan:

## 2013-08-29 ENCOUNTER — Other Ambulatory Visit: Payer: Self-pay

## 2013-08-29 DIAGNOSIS — I10 Essential (primary) hypertension: Secondary | ICD-10-CM

## 2013-08-29 MED ORDER — BENAZEPRIL HCL 40 MG PO TABS: ORAL_TABLET | ORAL | Status: DC

## 2013-08-29 MED ORDER — AMLODIPINE BESYLATE 10 MG PO TABS: 10.0000 mg | ORAL_TABLET | Freq: Every day | ORAL | Status: DC

## 2013-09-03 ENCOUNTER — Other Ambulatory Visit (HOSPITAL_COMMUNITY): Payer: Medicare HMO

## 2013-09-03 ENCOUNTER — Ambulatory Visit (HOSPITAL_COMMUNITY)
Admission: RE | Admit: 2013-09-03 | Discharge: 2013-09-03 | Disposition: A | Discharge status: Home / Self Care | Payer: Medicare HMO | Source: Ambulatory Visit | Attending: Family Medicine | Admitting: Family Medicine

## 2013-09-03 DIAGNOSIS — Z139 Encounter for screening, unspecified: Secondary | ICD-10-CM

## 2013-09-03 DIAGNOSIS — Z13820 Encounter for screening for osteoporosis: Secondary | ICD-10-CM | POA: Insufficient documentation

## 2013-09-03 DIAGNOSIS — Z780 Asymptomatic menopausal state: Secondary | ICD-10-CM | POA: Insufficient documentation

## 2013-09-03 DIAGNOSIS — Z1231 Encounter for screening mammogram for malignant neoplasm of breast: Secondary | ICD-10-CM | POA: Insufficient documentation

## 2013-10-15 NOTE — Assessment & Plan Note (Signed)
Controlled, no change in medication Hyperlipidemia:Low fat diet discussed and encouraged.  \ 

## 2013-10-15 NOTE — Assessment & Plan Note (Signed)
Uncontrolled, dose increase in clonidine DASH diet and commitment to daily physical activity for a minimum of 30 minutes discussed and encouraged, as a part of hypertension management. The importance of attaining a healthy weight is also discussed.

## 2013-10-15 NOTE — Assessment & Plan Note (Signed)
Unchanged. Patient re-educated about  the importance of commitment to a  minimum of 150 minutes of exercise per week. The importance of healthy food choices with portion control discussed. Encouraged to start a food diary, count calories and to consider  joining a support group. Sample diet sheets offered. Goals set by the patient for the next several months.    

## 2013-10-15 NOTE — Assessment & Plan Note (Signed)
Excellent control, no med change

## 2013-10-25 ENCOUNTER — Other Ambulatory Visit: Payer: Self-pay

## 2013-10-25 DIAGNOSIS — I10 Essential (primary) hypertension: Secondary | ICD-10-CM

## 2013-10-25 MED ORDER — CLONIDINE HCL 0.3 MG PO TABS: ORAL_TABLET | ORAL | Status: DC

## 2013-12-21 LAB — COMPLETE METABOLIC PANEL WITH GFR
ALT: 14 U/L (ref 0–35)
AST: 18 U/L (ref 0–37)
Albumin: 4.7 g/dL (ref 3.5–5.2)
Alkaline Phosphatase: 83 U/L (ref 39–117)
BUN: 15 mg/dL (ref 6–23)
CO2: 31 mEq/L (ref 19–32)
Calcium: 9.7 mg/dL (ref 8.4–10.5)
Chloride: 102 mEq/L (ref 96–112)
Creat: 0.95 mg/dL (ref 0.50–1.10)
GFR, Est African American: 71 mL/min
GFR, Est Non African American: 62 mL/min
Glucose, Bld: 146 mg/dL — ABNORMAL HIGH (ref 70–99)
Potassium: 3.9 mEq/L (ref 3.5–5.3)
Sodium: 140 mEq/L (ref 135–145)
Total Bilirubin: 0.8 mg/dL (ref 0.2–1.2)
Total Protein: 7.2 g/dL (ref 6.0–8.3)

## 2013-12-21 LAB — HEMOGLOBIN A1C
Hgb A1c MFr Bld: 6.7 % — ABNORMAL HIGH (ref <5.7)
Mean Plasma Glucose: 146 mg/dL — ABNORMAL HIGH (ref <117)

## 2013-12-22 LAB — VITAMIN D 25 HYDROXY (VIT D DEFICIENCY, FRACTURES): Vit D, 25-Hydroxy: 65 ng/mL (ref 30–89)

## 2013-12-27 ENCOUNTER — Ambulatory Visit (INDEPENDENT_AMBULATORY_CARE_PROVIDER_SITE_OTHER): Payer: Medicare HMO | Admitting: Family Medicine

## 2013-12-27 ENCOUNTER — Encounter: Payer: Self-pay | Admitting: Family Medicine

## 2013-12-27 VITALS — BP 154/84 | HR 71 | Resp 16 | Ht 67.0 in | Wt 174.8 lb

## 2013-12-27 DIAGNOSIS — E785 Hyperlipidemia, unspecified: Secondary | ICD-10-CM

## 2013-12-27 DIAGNOSIS — I10 Essential (primary) hypertension: Secondary | ICD-10-CM

## 2013-12-27 DIAGNOSIS — E119 Type 2 diabetes mellitus without complications: Secondary | ICD-10-CM

## 2013-12-27 DIAGNOSIS — E663 Overweight: Secondary | ICD-10-CM

## 2013-12-27 MED ORDER — ATENOLOL-CHLORTHALIDONE 50-25 MG PO TABS: 1.0000 | ORAL_TABLET | Freq: Every day | ORAL | Status: DC

## 2013-12-27 MED ORDER — METFORMIN HCL 500 MG PO TABS: 500.0000 mg | ORAL_TABLET | Freq: Every day | ORAL | Status: DC

## 2013-12-27 MED ORDER — LOVASTATIN 40 MG PO TABS: 40.0000 mg | ORAL_TABLET | Freq: Every day | ORAL | Status: DC

## 2013-12-27 NOTE — Progress Notes (Signed)
   Subjective:    Patient ID: Mikayla Jenkins, female    DOB: [birth year], 68 y.o.   MRN: [id number]  HPI  The PT is here for follow up and re-evaluation of chronic medical conditions, medication management and review of any available recent lab and radiology data.  Preventive health is updated, specifically  Cancer screening and Immunization.  Committed to regular exercise and healthy diet, feels well  The PT denies any adverse reactions to current medications since the last visit.  One day h/o involuntary jumping of muscle under left eye, no other muscle acting like this, no change in vision or eye trauma, el;ects to monitor before referral to neurology    Review of Systems See HPI Denies recent fever or chills. Denies sinus pressure, nasal congestion, ear pain or sore throat. Denies chest congestion, productive cough or wheezing. Denies chest pains, palpitations and leg swelling Denies abdominal pain, nausea, vomiting,diarrhea or constipation.   Denies dysuria, frequency, hesitancy or incontinence. Denies joint pain, swelling and limitation in mobility. Denies headaches, seizures, numbness, or tingling. Denies depression, anxiety or insomnia. Denies skin break down or rash.        Objective:   Physical Exam BP 154/84  Pulse 71  Resp 16  Ht 5\' 7"  (1.702 m)  Wt 174 lb 12.8 oz (79.289 kg)  BMI 27.37 kg/m2  SpO2 99% Patient alert and oriented and in no cardiopulmonary distress.  HEENT: No facial asymmetry, EOMI, no sinus tenderness,  oropharynx pink and moist.  Neck supple no adenopathy.  Chest: Clear to auscultation bilaterally.  CVS: S1, S2 no murmurs, no S3.  ABD: Soft non tender. Bowel sounds normal.  Ext: No edema  MS: Adequate ROM spine, shoulders, hips and knees.  Skin: Intact, no ulcerations or rash noted.  Psych: Good eye contact, normal affect. Memory intact not anxious or depressed appearing.  CNS: CN 2-12 intact, power, tone and sensation normal  throughout.        Assessment & Plan:  HYPERTENSION Increased in office, I truly believe there is some white coat hTN in this pt. Her at home meter is reliable and she has good readings at home and is currently on m,ultiple different medications. No med change DASH diet and commitment to daily physical activity for a minimum of 30 minutes discussed and encouraged, as a part of hypertension management. The importance of attaining a healthy weight is also discussed.   DIABETES MELLITUS, TYPE II Controlled, no change in medication Patient advised to reduce carb and sweets, commit to regular physical activity, take meds as prescribed, test blood as directed, and attempt to lose weight, to improve blood sugar control.   HYPERLIPIDEMIA Controlled, no change in medication Hyperlipidemia:Low fat diet discussed and encouraged.    OVERWEIGHT Unchanged. Patient re-educated about  the importance of commitment to a  minimum of 150 minutes of exercise per week. The importance of healthy food choices with portion control discussed. Encouraged to start a food diary, count calories and to consider  joining a support group. Sample diet sheets offered. Goals set by the patient for the next several months.

## 2013-12-27 NOTE — Patient Instructions (Signed)
Annual exam in 4 month, call if you need me before  Labs are excellent  Continue healthy lifestyle  Please call back if you decide to see a neurologist / Dr Merlene Laughter re eye twitching   Fasting lipid, cmp and EGFr, HBA1C , TSH and CBC in 4 month, befoe visit

## 2013-12-30 NOTE — Assessment & Plan Note (Signed)
Controlled, no change in medication Hyperlipidemia:Low fat diet discussed and encouraged.  \ 

## 2013-12-30 NOTE — Assessment & Plan Note (Signed)
Unchanged. Patient re-educated about  the importance of commitment to a  minimum of 150 minutes of exercise per week. The importance of healthy food choices with portion control discussed. Encouraged to start a food diary, count calories and to consider  joining a support group. Sample diet sheets offered. Goals set by the patient for the next several months.    

## 2013-12-30 NOTE — Assessment & Plan Note (Signed)
Controlled, no change in medication Patient advised to reduce carb and sweets, commit to regular physical activity, take meds as prescribed, test blood as directed, and attempt to lose weight, to improve blood sugar control.  

## 2013-12-30 NOTE — Assessment & Plan Note (Signed)
Increased in office, I truly believe there is some white coat hTN in this pt. Her at home meter is reliable and she has good readings at home and is currently on m,ultiple different medications. No med change DASH diet and commitment to daily physical activity for a minimum of 30 minutes discussed and encouraged, as a part of hypertension management. The importance of attaining a healthy weight is also discussed.

## 2014-01-17 ENCOUNTER — Telehealth: Payer: Self-pay | Admitting: Family Medicine

## 2014-01-17 NOTE — Telephone Encounter (Signed)
Pt has appt Monday for lower back pain. Advised if worse to go to Er

## 2014-01-21 ENCOUNTER — Ambulatory Visit (INDEPENDENT_AMBULATORY_CARE_PROVIDER_SITE_OTHER): Payer: Medicare HMO | Admitting: Family Medicine

## 2014-01-21 ENCOUNTER — Encounter: Payer: Self-pay | Admitting: Family Medicine

## 2014-01-21 VITALS — BP 134/70 | HR 60 | Resp 18 | Wt 174.1 lb

## 2014-01-21 DIAGNOSIS — M5430 Sciatica, unspecified side: Secondary | ICD-10-CM

## 2014-01-21 DIAGNOSIS — I10 Essential (primary) hypertension: Secondary | ICD-10-CM

## 2014-01-21 DIAGNOSIS — M5431 Sciatica, right side: Secondary | ICD-10-CM

## 2014-01-21 MED ORDER — PREDNISONE (PAK) 5 MG PO TABS: 5.0000 mg | ORAL_TABLET | ORAL | Status: DC

## 2014-01-21 NOTE — Progress Notes (Signed)
   Subjective:    Patient ID: Mikayla Jenkins, female    DOB: 08-06-1946, 68 y.o.   MRN: [id number]  HPI 5 day h/o back pain radiaiting down right leg with no aggravating factor. Pain is worse on awaking rated at a 10 and limiting  Mobility. Denies loss of power and sensation, has established severe stenosis and disc disease, after epidural approximately 2 years ago had no recurrent symptoms of significance prior to this  Does note increased pain during Winter months, has  limited amt of hydrocodone which she uses sparingly. She actually went to the gym this morning. Prior to this and otherwise , no complaints   Review of Systems See HPI Denies recent fever or chills. Denies sinus pressure, nasal congestion, ear pain or sore throat. Denies chest congestion, productive cough or wheezing. Denies chest pains, palpitations and leg swelling Denies abdominal pain, nausea, vomiting,diarrhea or constipation.   Denies dysuria, frequency, hesitancy or incontinence.  Sleep is disturbed by right leg pain in past 5 days         Objective:   Physical Exam BP 134/70  Pulse 60  Resp 18  Wt 174 lb 1.3 oz (78.962 kg)  SpO2 98% Patient alert and oriented and in no cardiopulmonary distress.Pt in pain  HEENT: No facial asymmetry, EOMI, t.  Neck supple   Chest: Clear to auscultation bilaterally.  CVS: S1, S2 no murmurs, no S3.  ABD: Soft non tender. Bowel sounds normal.  Ext: No edema  MS: Decreased  ROM lumbar  spine,  Skin: Intact, no ulcerations or rash noted.  Psych: Good eye contact, normal affect. Memory intact not anxious or depressed appearing.  CNS: CN 2-12 intact, power, sensation normal throughout.        Assessment & Plan:  Sciatica of right side 5 day h/o increased and uncontrolled pain in low back and right lower extremity, short sharp course of anti inflammatories, if no adequate response , will refer for epidural , which has helped in the past. Stop gym this  week Toradol and depo medrol adminisitered in the office also  HYPERTENSION Controlled, no change in medication

## 2014-01-21 NOTE — Patient Instructions (Signed)
F/u as before  Your pain is due to severe arthritis and disc disease in your spine. Toradol 60mg  IM and depo medrol 120 mg IM in the office today, followed by a prednisone dose pack to fill locally, start the prednisone tomorrow please   oK to take quarter to half of the hydrocodone pain pill you have if needed once daily for uncontrolled pain , it is sedating so no driving when you take this and care with falls.  PLEASE CONTACT the office this [facility] morning, 01/24/2014 , before 12 midday, if your pain symptoms are not significantly improved, you will be referred for an epidural which has helped a lot in the past. If you do need to call back about uncontrolled pain on Friday of this week or  in the following week OK to do so , your needs will be addressed .  I hope you feel better soon, and please hold off on the gym for the rest of this week  Blood pressure today is excellent!

## 2014-01-21 NOTE — Assessment & Plan Note (Signed)
Controlled, no change in medication  

## 2014-01-21 NOTE — Assessment & Plan Note (Addendum)
5 day h/o increased and uncontrolled pain in low back and right lower extremity, short sharp course of anti inflammatories, if no adequate response , will refer for epidural , which has helped in the past. Stop gym this week Toradol and depo medrol adminisitered in the office also

## 2014-01-22 MED ORDER — METHYLPREDNISOLONE ACETATE 80 MG/ML IJ SUSP
120.0000 mg | Freq: Once | INTRAMUSCULAR | Status: AC
  Administered 2014-01-22: 120 mg via INTRAMUSCULAR

## 2014-01-22 MED ORDER — KETOROLAC TROMETHAMINE 60 MG/2ML IM SOLN
60.0000 mg | Freq: Once | INTRAMUSCULAR | Status: AC
  Administered 2014-01-22: 60 mg via INTRAMUSCULAR

## 2014-01-22 NOTE — Addendum Note (Signed)
Addended by: Denman George B on: 01/22/2014 09:47 AM   Modules accepted: Orders

## 2014-03-29 ENCOUNTER — Other Ambulatory Visit: Payer: Self-pay

## 2014-03-29 DIAGNOSIS — I10 Essential (primary) hypertension: Secondary | ICD-10-CM

## 2014-03-29 MED ORDER — CLONIDINE HCL 0.3 MG PO TABS: ORAL_TABLET | ORAL | Status: DC

## 2014-05-03 LAB — CBC
HCT: 36.8 % (ref 36.0–46.0)
HEMOGLOBIN: 12.6 g/dL (ref 12.0–15.0)
MCH: 31.3 pg (ref 26.0–34.0)
MCHC: 34.2 g/dL (ref 30.0–36.0)
MCV: 91.3 fL (ref 78.0–100.0)
Platelets: 243 10*3/uL (ref 150–400)
RBC: 4.03 MIL/uL (ref 3.87–5.11)
RDW: 12.7 % (ref 11.5–15.5)
WBC: 4.8 10*3/uL (ref 4.0–10.5)

## 2014-05-03 LAB — COMPLETE METABOLIC PANEL WITH GFR
ALT: 16 U/L (ref 0–35)
AST: 18 U/L (ref 0–37)
Albumin: 4 g/dL (ref 3.5–5.2)
Alkaline Phosphatase: 86 U/L (ref 39–117)
BUN: 18 mg/dL (ref 6–23)
CHLORIDE: 103 meq/L (ref 96–112)
CO2: 30 meq/L (ref 19–32)
CREATININE: 1.05 mg/dL (ref 0.50–1.10)
Calcium: 9.8 mg/dL (ref 8.4–10.5)
GFR, EST AFRICAN AMERICAN: 63 mL/min
GFR, Est Non African American: 55 mL/min — ABNORMAL LOW
Glucose, Bld: 147 mg/dL — ABNORMAL HIGH (ref 70–99)
Potassium: 4.1 mEq/L (ref 3.5–5.3)
Sodium: 140 mEq/L (ref 135–145)
TOTAL PROTEIN: 6.7 g/dL (ref 6.0–8.3)
Total Bilirubin: 0.9 mg/dL (ref 0.2–1.2)

## 2014-05-03 LAB — LIPID PANEL
CHOLESTEROL: 175 mg/dL (ref 0–200)
HDL: 87 mg/dL (ref >39)
LDL Cholesterol: 75 mg/dL (ref 0–99)
TRIGLYCERIDES: 63 mg/dL (ref <150)
Total CHOL/HDL Ratio: 2 Ratio
VLDL: 13 mg/dL (ref 0–40)

## 2014-05-03 LAB — HEMOGLOBIN A1C
Hgb A1c MFr Bld: 7.2 % — ABNORMAL HIGH (ref <5.7)
MEAN PLASMA GLUCOSE: 160 mg/dL — AB (ref <117)

## 2014-05-03 LAB — TSH: TSH: 1.238 u[IU]/mL (ref 0.350–4.500)

## 2014-05-07 ENCOUNTER — Ambulatory Visit (INDEPENDENT_AMBULATORY_CARE_PROVIDER_SITE_OTHER): Payer: Medicare HMO | Admitting: Family Medicine

## 2014-05-07 ENCOUNTER — Encounter: Payer: Self-pay | Admitting: Family Medicine

## 2014-05-07 VITALS — BP 152/82 | HR 62 | Resp 16 | Ht 67.0 in | Wt 173.0 lb

## 2014-05-07 DIAGNOSIS — M5430 Sciatica, unspecified side: Secondary | ICD-10-CM

## 2014-05-07 DIAGNOSIS — M5431 Sciatica, right side: Secondary | ICD-10-CM

## 2014-05-07 DIAGNOSIS — Z0000 Encounter for general adult medical examination without abnormal findings: Secondary | ICD-10-CM

## 2014-05-07 DIAGNOSIS — E119 Type 2 diabetes mellitus without complications: Secondary | ICD-10-CM

## 2014-05-07 DIAGNOSIS — E785 Hyperlipidemia, unspecified: Secondary | ICD-10-CM

## 2014-05-07 DIAGNOSIS — Z1211 Encounter for screening for malignant neoplasm of colon: Secondary | ICD-10-CM

## 2014-05-07 DIAGNOSIS — Z23 Encounter for immunization: Secondary | ICD-10-CM

## 2014-05-07 DIAGNOSIS — I10 Essential (primary) hypertension: Secondary | ICD-10-CM

## 2014-05-07 LAB — POC HEMOCCULT BLD/STL (OFFICE/1-CARD/DIAGNOSTIC): FECAL OCCULT BLD: NEGATIVE

## 2014-05-07 MED ORDER — LOVASTATIN 40 MG PO TABS: 40.0000 mg | ORAL_TABLET | Freq: Every day | ORAL | Status: DC

## 2014-05-07 MED ORDER — HYDROCODONE-ACETAMINOPHEN 5-325 MG PO TABS: 1.0000 | ORAL_TABLET | Freq: Every day | ORAL | Status: DC | PRN

## 2014-05-07 MED ORDER — METFORMIN HCL 850 MG PO TABS: 850.0000 mg | ORAL_TABLET | Freq: Every day | ORAL | Status: DC

## 2014-05-07 NOTE — Progress Notes (Signed)
Subjective:    Patient ID: Mikayla Jenkins, female    DOB: [birth year], 68 y.o.   MRN: [id number]  HPI Patient is in for annual physical exam. 4 week h/o disabling low back pain radiating down buttock and thigh,has benefited in the past from epidural and requests referral for same Recent labs are reviewed, and dose adjustment made in metformin per pt preference, blood sugar currently adequately controlled but wishes it to be lower   Review of Systems See HPI Denies recent fever or chills. Denies sinus pressure, nasal congestion, ear pain or sore throat. Denies chest congestion, productive cough or wheezing. Denies chest pains, palpitations and leg swelling Denies abdominal pain, nausea, vomiting,diarrhea or constipation.   Denies dysuria, frequency, hesitancy or incontinence. I Denies headaches, seizures, has  numbness, and  Tingling in right leg. Denies depression, has increased  anxiety and  Insomnia due to back pain Denies skin break down or rash.        Objective:   Physical Exam BP 152/82  Pulse 62  Resp 16  Ht 5\' 7"  (1.702 m)  Wt 173 lb (78.472 kg)  BMI 27.09 kg/m2  SpO2 99% Pt in severe pain Pleasant well nourished female, alert and oriented x 3, in no cardio-pulmonary distress.Pt in pain d due to flare of back pain radiating to right leg for 1 month, sh has established disease in her spine Afebrile. HEENT No facial trauma or asymetry. Sinuses non tender.  EOMI, PERTL, .  External ears normal, tympanic membranes clear. Oropharynx moist, no exudate,fair dentition. Neck: supple, no adenopathy,JVD or thyromegaly.No bruits.  Chest: Clear to ascultation bilaterally.No crackles or wheezes. Non tender to palpation  Breast: No asymetry,no masses or lumps. No tenderness. No nipple discharge or inversion. No axillary or supraclavicular adenopathy  Cardiovascular system; Heart sounds normal,  S1 and  S2 ,no S3.  No murmur, or thrill. Apical beat not  displaced Peripheral pulses normal.  Abdomen: Soft, non tender, no organomegaly or masses. No bruits. Bowel sounds normal. No guarding, tenderness or rebound.  Rectal:  Normal sphincter tone. No mass.No rectal masses.  Guaiac negative stool.  GU: External genitalia normal female genitalia , female distribution of hair. No lesions. Urethral meatus normal in size, no  Prolapse, no lesions visibly  Present. Bladder non tender. Vagina pink and moist , with no visible lesions , physiologic  discharge present . Adequate pelvic support no  cystocele or rectocele noted Uterus absent, no adnexal masses, no  adnexal tenderness.   Musculoskeletal exam: Markedly decreased ROM of lumbar  spine, limited by pain, decreased ROM hips right more than left ,adequate in  shoulders and knees. No deformity ,swelling or crepitus noted. No muscle wasting or atrophy.   Neurologic: Cranial nerves 2 to 12 intact. Power, tone and reflexes normal throughout Abnormal gait today due to uncontrolled back and right lower extremity pain. No tremor.  Skin: Intact, no ulceration, erythema , scaling or rash noted. Pigmentation normal throughout  Psych; Normal mood and affect. Judgement and concentration normal        Assessment & Plan:  Need for vaccination with 13-polyvalent pneumococcal conjugate vaccine Vaccine administered at visit  Encounter for annual physical exam Annual exam as documented. Counseling done  re healthy lifestyle involving commitment to 150 minutes exercise per week, heart healthy diet, and attaining healthy weight.The importance of adequate sleep also discussed. Regular seat belt use and safe storage  of firearms if patient has them, is also discussed. Changes in health habits are  decided on by the patient with goals and time frames  set for achieving them. Immunization and cancer screening needs are specifically addressed at this visit.   Sciatica of right  side uncontrolled with 4 week h/o uncontrolled pain keeping patient awake and limiting her activity. Has benefited in the past from epidural and requests repeat , will refer Hydrocodone 330 tabs prescribed for limited use and narcotic contract already signed and on file  DIABETES MELLITUS, TYPE II Adequately controlled, however pt states she prefers  To take higher dose of metformin and have her blood suagr lower Patient advised to reduce carb and sweets, commit to regular physical activity, take meds as prescribed, test blood as directed, and attempt to lose weight, to improve blood sugar control. Dose changed to 850 mg daily Updated lab needed at/ before next visit.   HYPERLIPIDEMIA Controlled, no change in medication Hyperlipidemia:Low fat diet discussed and encouraged.    HYPERTENSION Elevated today, however , pt in significant pain, no changes will bve made in her medication today, uncontrolled bp has been a continual challenge with this pt, she does also have an element of"whoite coat hypertension"  DASH diet and commitment to daily physical activity for a minimum of 30 minutes discussed and encouraged, as a part of hypertension management. The importance of attaining a healthy weight is also discussed.

## 2014-05-07 NOTE — Patient Instructions (Addendum)
F/u in 3 month, call if you need me before  You are referred for epidural for pain  Script for 30 hydrocodone tabs today  New dose of metformin today  HBa1C, chem 7 and EGFr and microalb in 3 month  Foot exam with claw toe and reduced sensation on left 3rd toe and bunnion qualifies you for diabetic shoes

## 2014-05-08 ENCOUNTER — Encounter: Payer: Self-pay | Admitting: Family Medicine

## 2014-05-09 ENCOUNTER — Telehealth: Payer: Self-pay | Admitting: Family Medicine

## 2014-05-09 NOTE — Telephone Encounter (Signed)
pls let pt know thje situation,and refer where she can go to

## 2014-05-12 DIAGNOSIS — Z0000 Encounter for general adult medical examination without abnormal findings: Secondary | ICD-10-CM | POA: Insufficient documentation

## 2014-05-12 DIAGNOSIS — Z23 Encounter for immunization: Secondary | ICD-10-CM | POA: Insufficient documentation

## 2014-05-12 NOTE — Assessment & Plan Note (Signed)
uncontrolled with 4 week h/o uncontrolled pain keeping patient awake and limiting her activity. Has benefited in the past from epidural and requests repeat , will refer Hydrocodone 330 tabs prescribed for limited use and narcotic contract already signed and on file

## 2014-05-12 NOTE — Assessment & Plan Note (Signed)
Annual exam as documented. Counseling done  re healthy lifestyle involving commitment to 150 minutes exercise per week, heart healthy diet, and attaining healthy weight.The importance of adequate sleep also discussed. Regular seat belt use and safe storage  of firearms if patient has them, is also discussed. Changes in health habits are decided on by the patient with goals and time frames  set for achieving them. Immunization and cancer screening needs are specifically addressed at this visit.  

## 2014-05-12 NOTE — Assessment & Plan Note (Signed)
Controlled, no change in medication Hyperlipidemia:Low fat diet discussed and encouraged.  \ 

## 2014-05-12 NOTE — Assessment & Plan Note (Signed)
Vaccine administered at visit.  

## 2014-05-12 NOTE — Assessment & Plan Note (Signed)
Elevated today, however , pt in significant pain, no changes will bve made in her medication today, uncontrolled bp has been a continual challenge with this pt, she does also have an element of"whoite coat hypertension"  DASH diet and commitment to daily physical activity for a minimum of 30 minutes discussed and encouraged, as a part of hypertension management. The importance of attaining a healthy weight is also discussed.

## 2014-05-12 NOTE — Assessment & Plan Note (Addendum)
Adequately controlled, however pt states she prefers  To take higher dose of metformin and have her blood suagr lower Patient advised to reduce carb and sweets, commit to regular physical activity, take meds as prescribed, test blood as directed, and attempt to lose weight, to improve blood sugar control. Dose changed to 850 mg daily Updated lab needed at/ before next visit.

## 2014-05-15 ENCOUNTER — Other Ambulatory Visit: Payer: Self-pay | Admitting: Family Medicine

## 2014-05-15 DIAGNOSIS — M5431 Sciatica, right side: Secondary | ICD-10-CM

## 2014-05-21 ENCOUNTER — Ambulatory Visit
Admission: RE | Admit: 2014-05-21 | Discharge: 2014-05-21 | Disposition: A | Discharge status: Home / Self Care | Payer: Commercial Managed Care - HMO | Source: Ambulatory Visit | Attending: Family Medicine | Admitting: Family Medicine

## 2014-05-21 VITALS — BP 131/57 | HR 66

## 2014-05-21 DIAGNOSIS — M5431 Sciatica, right side: Secondary | ICD-10-CM

## 2014-05-21 MED ORDER — METHYLPREDNISOLONE ACETATE 40 MG/ML INJ SUSP (RADIOLOG
120.0000 mg | Freq: Once | INTRAMUSCULAR | Status: AC
  Administered 2014-05-21: 120 mg via EPIDURAL

## 2014-05-21 MED ORDER — IOHEXOL 180 MG/ML  SOLN
1.0000 mL | Freq: Once | INTRAMUSCULAR | Status: AC | PRN
  Administered 2014-05-21: 1 mL via EPIDURAL

## 2014-05-21 NOTE — Discharge Instructions (Signed)

## 2014-07-29 ENCOUNTER — Ambulatory Visit (INDEPENDENT_AMBULATORY_CARE_PROVIDER_SITE_OTHER): Payer: Commercial Managed Care - HMO | Admitting: Family Medicine

## 2014-07-29 ENCOUNTER — Encounter: Payer: Self-pay | Admitting: Family Medicine

## 2014-07-29 VITALS — BP 170/88 | HR 72 | Resp 16 | Ht 67.0 in | Wt 170.1 lb

## 2014-07-29 DIAGNOSIS — E785 Hyperlipidemia, unspecified: Secondary | ICD-10-CM

## 2014-07-29 DIAGNOSIS — R300 Dysuria: Secondary | ICD-10-CM

## 2014-07-29 DIAGNOSIS — F419 Anxiety disorder, unspecified: Secondary | ICD-10-CM

## 2014-07-29 DIAGNOSIS — I10 Essential (primary) hypertension: Secondary | ICD-10-CM

## 2014-07-29 DIAGNOSIS — Z23 Encounter for immunization: Secondary | ICD-10-CM

## 2014-07-29 DIAGNOSIS — F5105 Insomnia due to other mental disorder: Secondary | ICD-10-CM

## 2014-07-29 LAB — POCT URINALYSIS DIPSTICK
Bilirubin, UA: NEGATIVE
Blood, UA: NEGATIVE
GLUCOSE UA: NEGATIVE
Ketones, UA: NEGATIVE
Leukocytes, UA: NEGATIVE
NITRITE UA: NEGATIVE
PROTEIN UA: NEGATIVE
UROBILINOGEN UA: 0.2
pH, UA: 6.5

## 2014-07-29 MED ORDER — TEMAZEPAM 15 MG PO CAPS: 15.0000 mg | ORAL_CAPSULE | Freq: Every day | ORAL | Status: DC

## 2014-07-29 NOTE — Patient Instructions (Signed)
F/u as before  Flu vaccine today  No infection in urine  Blood pressure is higher today than normal, but lack of sleep I believe is the biggest problem  Leg swelling is much better from what you say, keep salt down and elevate legs   New for sleep is restoril

## 2014-07-30 ENCOUNTER — Ambulatory Visit: Payer: Commercial Managed Care - HMO | Admitting: Family Medicine

## 2014-07-31 ENCOUNTER — Telehealth: Payer: Self-pay | Admitting: Family Medicine

## 2014-07-31 NOTE — Telephone Encounter (Signed)
That's great. Noted

## 2014-08-09 DIAGNOSIS — R300 Dysuria: Secondary | ICD-10-CM | POA: Insufficient documentation

## 2014-08-09 DIAGNOSIS — Z23 Encounter for immunization: Secondary | ICD-10-CM | POA: Insufficient documentation

## 2014-08-09 NOTE — Progress Notes (Signed)
   Subjective:    Patient ID: Mikayla Jenkins, female    DOB: Jul 14, 1946, 68 y.o.   MRN: [id number]  HPI C/o increased urinary frequency since epidural, increased anxiety and leg swelling. Denies chest pain or palpitations, denies PND or orthopnea Leg swellin has reduced since she has cut back on salt and elevated tem She is having poor sleep, fatigue and anxiety   Review of Systems See HPI Denies recent fever or chills. Denies sinus pressure, nasal congestion, ear pain or sore throat. Denies chest congestion, productive cough or wheezing. Denies abdominal pain, nausea, vomiting,diarrhea or constipation.   Denies dysuria, frequency, hesitancy or incontinence. Denies joint pain, swelling and limitation in mobility. Denies headaches, seizures, numbness, or tingling. Denies skin break down or rash.        Objective:   Physical Exam  BP 170/88  Pulse 72  Resp 16  Ht 5\' 7"  (1.702 m)  Wt 170 lb 1.9 oz (77.166 kg)  BMI 26.64 kg/m2  SpO2 98% Patient alert and oriented and in no cardiopulmonary distress.  HEENT: No facial asymmetry, EOMI,   oropharynx pink and moist.  Neck supple no JVD, no mass.  Chest: Clear to auscultation bilaterally.  CVS: S1, S2 no murmurs, no S3.Regular rate.  ABD: Soft non tender.   Ext: No edema  MS: Adequate ROM spine, shoulders, hips and knees.  Skin: Intact, no ulcerations or rash noted.  Psych: Good eye contact, normal affect. Memory intact  anxious not depressed appearing.  CNS: CN 2-12 intact, power,  normal throughout.no focal deficits noted.       Assessment & Plan:  Insomnia secondary to anxiety Sleep hygiene reviewed and written information offered also. Prescription sent for  medication needed.   Essential hypertension Uncontrolled at this visit, however pt is anxious and sleep deprived No med change DASH diet and commitment to daily physical activity for a minimum of 30 minutes discussed and encouraged, as a part of  hypertension management. The importance of attaining a healthy weight is also discussed.   Hyperlipidemia LDL goal <100 Controlled, no change in medication Hyperlipidemia:Low fat diet discussed and encouraged.    Dysuria Symptomatic however UA in office is negative for infection, pt reassured  Need for prophylactic vaccination and inoculation against influenza Vaccine administered at visit.

## 2014-08-09 NOTE — Assessment & Plan Note (Signed)
Vaccine administered at visit.  

## 2014-08-09 NOTE — Assessment & Plan Note (Signed)
Symptomatic however UA in office is negative for infection, pt reassured

## 2014-08-09 NOTE — Assessment & Plan Note (Signed)
Controlled, no change in medication Hyperlipidemia:Low fat diet discussed and encouraged.  \ 

## 2014-08-09 NOTE — Assessment & Plan Note (Signed)
Uncontrolled at this visit, however pt is anxious and sleep deprived No med change DASH diet and commitment to daily physical activity for a minimum of 30 minutes discussed and encouraged, as a part of hypertension management. The importance of attaining a healthy weight is also discussed.

## 2014-08-09 NOTE — Assessment & Plan Note (Signed)
Sleep hygiene reviewed and written information offered also. Prescription sent for  medication needed.  

## 2014-08-16 LAB — HEMOGLOBIN A1C
Hgb A1c MFr Bld: 7 % — ABNORMAL HIGH (ref <5.7)
Mean Plasma Glucose: 154 mg/dL — ABNORMAL HIGH (ref <117)

## 2014-08-16 LAB — BASIC METABOLIC PANEL WITH GFR
BUN: 15 mg/dL (ref 6–23)
CHLORIDE: 101 meq/L (ref 96–112)
CO2: 29 mEq/L (ref 19–32)
Calcium: 9.5 mg/dL (ref 8.4–10.5)
Creat: 1.06 mg/dL (ref 0.50–1.10)
GFR, EST NON AFRICAN AMERICAN: 54 mL/min — AB
GFR, Est African American: 62 mL/min
Glucose, Bld: 145 mg/dL — ABNORMAL HIGH (ref 70–99)
Potassium: 3.8 mEq/L (ref 3.5–5.3)
SODIUM: 138 meq/L (ref 135–145)

## 2014-08-16 LAB — MICROALBUMIN / CREATININE URINE RATIO
Creatinine, Urine: 219.4 mg/dL
MICROALB/CREAT RATIO: 3.2 mg/g (ref 0.0–30.0)
Microalb, Ur: 0.7 mg/dL (ref <2.0)

## 2014-08-19 ENCOUNTER — Ambulatory Visit (INDEPENDENT_AMBULATORY_CARE_PROVIDER_SITE_OTHER): Payer: Commercial Managed Care - HMO | Admitting: Family Medicine

## 2014-08-19 ENCOUNTER — Encounter: Payer: Self-pay | Admitting: Family Medicine

## 2014-08-19 VITALS — BP 158/86 | HR 72 | Resp 16 | Ht 67.0 in | Wt 169.0 lb

## 2014-08-19 DIAGNOSIS — F5105 Insomnia due to other mental disorder: Secondary | ICD-10-CM

## 2014-08-19 DIAGNOSIS — I10 Essential (primary) hypertension: Secondary | ICD-10-CM

## 2014-08-19 DIAGNOSIS — E785 Hyperlipidemia, unspecified: Secondary | ICD-10-CM

## 2014-08-19 DIAGNOSIS — E119 Type 2 diabetes mellitus without complications: Secondary | ICD-10-CM

## 2014-08-19 DIAGNOSIS — F419 Anxiety disorder, unspecified: Secondary | ICD-10-CM

## 2014-08-19 MED ORDER — METFORMIN HCL 850 MG PO TABS: 850.0000 mg | ORAL_TABLET | Freq: Every day | ORAL | Status: DC

## 2014-08-19 MED ORDER — ATENOLOL-CHLORTHALIDONE 50-25 MG PO TABS: 1.0000 | ORAL_TABLET | Freq: Every day | ORAL | Status: DC

## 2014-08-19 MED ORDER — AMLODIPINE BESYLATE 10 MG PO TABS: 10.0000 mg | ORAL_TABLET | Freq: Every day | ORAL | Status: DC

## 2014-08-19 MED ORDER — CLONIDINE HCL 0.3 MG PO TABS: ORAL_TABLET | ORAL | Status: DC

## 2014-08-19 MED ORDER — BENAZEPRIL HCL 40 MG PO TABS: ORAL_TABLET | ORAL | Status: DC

## 2014-08-19 NOTE — Assessment & Plan Note (Addendum)
Controlled, no change in medication Patient advised to reduce carb and sweets, commit to regular physical activity, take meds as prescribed, test blood as directed, and attempt to lose weight, to improve blood sugar control. Updated lab needed at/ before next visit. Has eye exam scheduled, she is aware past due

## 2014-08-19 NOTE — Assessment & Plan Note (Signed)
Improved, uses medication only "as needed" \Sleep hygiene is reviewed at visit, she does practice good sleep hygiene

## 2014-08-19 NOTE — Patient Instructions (Signed)
Annual wellness, initial in 4 month, call if you need me before  Recent labs are excellent  Though blood pressure is higher than it should be, I am holding off on any med changes at this time  It is important that you exercise regularly at least 30 minutes 5 times a week. If you develop chest pain, have severe difficulty breathing, or feel very tired, stop exercising immediately and seek medical attention   Please eat 3 meals every day   Fasting lipid, cmp and EGFr and hBa1C in 4 month   ls schedule and keep your mammogram and tour eye exam

## 2014-08-19 NOTE — Progress Notes (Signed)
   Subjective:    Patient ID: Mikayla Jenkins, female    DOB: [birth year], 68 y.o.   MRN: [id number]  HPI The PT is here for follow up and re-evaluation of chronic medical conditions, medication management and review of any available recent lab and radiology data.  Preventive health is updated, specifically  Cancer screening and Immunization.   . The PT denies any adverse reactions to current medications since the last visit. Did well with the restoril, which she uses only "as needed" There are no new concerns. Leg swelling has resolved, and she is avoiding salt There are no specific complaints , "feels well"      Review of Systems See HPI Denies recent fever or chills. Denies sinus pressure, nasal congestion, ear pain or sore throat. Denies chest congestion, productive cough or wheezing. Denies chest pains, palpitations and leg swelling Denies abdominal pain, nausea, vomiting,diarrhea or constipation.   Denies dysuria, frequency, hesitancy or incontinence. Denies significant  joint pain, swelling and limitation in mobility.Has intermittent back and right hip pain, however , not disabling Denies headaches, seizures, numbness, or tingling. Denies depression, anxiety or insomnia. Denies skin break down or rash.        Objective:   Physical Exam  BP 158/86 mmHg  Pulse 72  Resp 16  Ht 5\' 7"  (1.702 m)  Wt 169 lb (76.658 kg)  BMI 26.46 kg/m2  SpO2 100% Patient alert and oriented and in no cardiopulmonary distress.  HEENT: No facial asymmetry, EOMI,   oropharynx pink and moist.  Neck supple no JVD, no mass.  Chest: Clear to auscultation bilaterally.  CVS: S1, S2 no murmurs, no S3.Regular rate.  ABD: Soft non tender.   Ext: No edema  MS: Adequate ROM spine, shoulders, hips and knees.  Skin: Intact, no ulcerations or rash noted.  Psych: Good eye contact, normal affect. Memory intact not anxious or depressed appearing.  CNS: CN 2-12 intact, power,  normal throughout.no  focal deficits noted.      Assessment & Plan:  Type 2 diabetes mellitus Controlled, no change in medication Patient advised to reduce carb and sweets, commit to regular physical activity, take meds as prescribed, test blood as directed, and attempt to lose weight, to improve blood sugar control. Updated lab needed at/ before next visit. Has eye exam scheduled, she is aware past due  Essential hypertension Elevated systolic, pt has significant white coat element to her blood pressure. She has brought her meter on previous occasions, it is reliable and she gets good blood pressures normally outside of the office. She is very diligent as far as health and medications are concerned. Will hold off on any medication adjustments at this time DASH diet and commitment to daily physical activity for a minimum of 30 minutes discussed and encouraged, as a part of hypertension management. The importance of attaining a healthy weight is also discussed.   Hyperlipidemia LDL goal <100 Controlled, no change in medication Hyperlipidemia:Low fat diet discussed and encouraged.  Updated lab needed at/ before next visit.   Insomnia secondary to anxiety Improved, uses medication only "as needed" \Sleep hygiene is reviewed at visit, she does practice good sleep hygiene

## 2014-08-19 NOTE — Assessment & Plan Note (Signed)
Elevated systolic, pt has significant white coat element to her blood pressure. She has brought her meter on previous occasions, it is reliable and she gets good blood pressures normally outside of the office. She is very diligent as far as health and medications are concerned. Will hold off on any medication adjustments at this time DASH diet and commitment to daily physical activity for a minimum of 30 minutes discussed and encouraged, as a part of hypertension management. The importance of attaining a healthy weight is also discussed.

## 2014-08-19 NOTE — Assessment & Plan Note (Signed)
Controlled, no change in medication Hyperlipidemia:Low fat diet discussed and encouraged.  Updated lab needed at/ before next visit.  

## 2014-08-26 ENCOUNTER — Other Ambulatory Visit: Payer: Self-pay | Admitting: Family Medicine

## 2014-08-26 DIAGNOSIS — Z1231 Encounter for screening mammogram for malignant neoplasm of breast: Secondary | ICD-10-CM

## 2014-09-05 ENCOUNTER — Ambulatory Visit (HOSPITAL_COMMUNITY)
Admission: RE | Admit: 2014-09-05 | Discharge: 2014-09-05 | Disposition: A | Discharge status: Home / Self Care | Payer: Commercial Managed Care - HMO | Source: Ambulatory Visit | Attending: Family Medicine | Admitting: Family Medicine

## 2014-09-05 DIAGNOSIS — Z1231 Encounter for screening mammogram for malignant neoplasm of breast: Secondary | ICD-10-CM | POA: Diagnosis present

## 2014-11-12 LAB — HM DIABETES EYE EXAM

## 2014-11-22 ENCOUNTER — Other Ambulatory Visit: Payer: Self-pay | Admitting: Family Medicine

## 2014-11-25 ENCOUNTER — Other Ambulatory Visit: Payer: Self-pay

## 2014-11-25 DIAGNOSIS — I10 Essential (primary) hypertension: Secondary | ICD-10-CM

## 2014-11-25 MED ORDER — ATENOLOL-CHLORTHALIDONE 50-25 MG PO TABS: 1.0000 | ORAL_TABLET | Freq: Every day | ORAL | Status: DC

## 2014-12-18 ENCOUNTER — Encounter: Payer: Commercial Managed Care - HMO | Admitting: Family Medicine

## 2014-12-23 ENCOUNTER — Encounter: Payer: Commercial Managed Care - HMO | Admitting: Family Medicine

## 2015-01-02 DIAGNOSIS — E785 Hyperlipidemia, unspecified: Secondary | ICD-10-CM | POA: Diagnosis not present

## 2015-01-02 DIAGNOSIS — E119 Type 2 diabetes mellitus without complications: Secondary | ICD-10-CM | POA: Diagnosis not present

## 2015-01-03 LAB — COMPLETE METABOLIC PANEL WITH GFR
ALK PHOS: 70 U/L (ref 39–117)
ALT: 14 U/L (ref 0–35)
AST: 18 U/L (ref 0–37)
Albumin: 4.6 g/dL (ref 3.5–5.2)
BILIRUBIN TOTAL: 0.8 mg/dL (ref 0.2–1.2)
BUN: 20 mg/dL (ref 6–23)
CO2: 31 meq/L (ref 19–32)
Calcium: 10.1 mg/dL (ref 8.4–10.5)
Chloride: 102 mEq/L (ref 96–112)
Creat: 1.02 mg/dL (ref 0.50–1.10)
GFR, EST NON AFRICAN AMERICAN: 56 mL/min — AB
GFR, Est African American: 65 mL/min
GLUCOSE: 124 mg/dL — AB (ref 70–99)
Potassium: 4.2 mEq/L (ref 3.5–5.3)
Sodium: 142 mEq/L (ref 135–145)
Total Protein: 6.8 g/dL (ref 6.0–8.3)

## 2015-01-03 LAB — LIPID PANEL
CHOLESTEROL: 186 mg/dL (ref 0–200)
HDL: 108 mg/dL (ref >=46)
LDL Cholesterol: 63 mg/dL (ref 0–99)
Total CHOL/HDL Ratio: 1.7 Ratio
Triglycerides: 75 mg/dL (ref <150)
VLDL: 15 mg/dL (ref 0–40)

## 2015-01-03 LAB — HEMOGLOBIN A1C
Hgb A1c MFr Bld: 6.6 % — ABNORMAL HIGH (ref <5.7)
MEAN PLASMA GLUCOSE: 143 mg/dL — AB (ref <117)

## 2015-01-06 ENCOUNTER — Ambulatory Visit (INDEPENDENT_AMBULATORY_CARE_PROVIDER_SITE_OTHER): Payer: Commercial Managed Care - HMO | Admitting: Family Medicine

## 2015-01-06 ENCOUNTER — Encounter: Payer: Self-pay | Admitting: Family Medicine

## 2015-01-06 VITALS — BP 146/74 | HR 68 | Resp 18 | Ht 67.0 in | Wt 169.1 lb

## 2015-01-06 DIAGNOSIS — I10 Essential (primary) hypertension: Secondary | ICD-10-CM

## 2015-01-06 DIAGNOSIS — Z1211 Encounter for screening for malignant neoplasm of colon: Secondary | ICD-10-CM

## 2015-01-06 DIAGNOSIS — E785 Hyperlipidemia, unspecified: Secondary | ICD-10-CM

## 2015-01-06 DIAGNOSIS — E119 Type 2 diabetes mellitus without complications: Secondary | ICD-10-CM

## 2015-01-06 DIAGNOSIS — Z0000 Encounter for general adult medical examination without abnormal findings: Secondary | ICD-10-CM | POA: Diagnosis not present

## 2015-01-06 MED ORDER — ATENOLOL-CHLORTHALIDONE 50-25 MG PO TABS
1.0000 | ORAL_TABLET | Freq: Every day | ORAL | Status: DC
Start: 2015-01-06 — End: 2015-08-15

## 2015-01-06 MED ORDER — BENAZEPRIL HCL 40 MG PO TABS: ORAL_TABLET | ORAL | Status: DC

## 2015-01-06 MED ORDER — CLONIDINE HCL 0.3 MG PO TABS: ORAL_TABLET | ORAL | Status: DC

## 2015-01-06 MED ORDER — LOVASTATIN 40 MG PO TABS: 40.0000 mg | ORAL_TABLET | Freq: Every day | ORAL | Status: DC

## 2015-01-06 MED ORDER — METFORMIN HCL 850 MG PO TABS: 850.0000 mg | ORAL_TABLET | Freq: Every day | ORAL | Status: DC

## 2015-01-06 MED ORDER — AMLODIPINE BESYLATE 10 MG PO TABS: 10.0000 mg | ORAL_TABLET | Freq: Every day | ORAL | Status: DC

## 2015-01-06 NOTE — Patient Instructions (Addendum)
Annual physical exam in 4 months,  Call if you need me before  Nurse visit for BP check in 3 weeks.This is my only concern  Pls take BP meds at same time every day as dicussed  You will get paper to take to pharmacy for the shingles vaccine  Congrats on excellent lifestyle and labs  HBA1C, chem 7 and EGFR, TSH, CBC and vit D in 4 month  Pls work on living will as discussed

## 2015-01-06 NOTE — Assessment & Plan Note (Signed)

## 2015-01-06 NOTE — Progress Notes (Signed)
Subjective:    Patient ID: Mikayla Jenkins, female    DOB: [birth year], 69 y.o.   MRN: [id number]  HPI Preventive Screening-Counseling & Management   Patient present here today for a Medicare annual wellness visit. 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 No  Over the past two weeks, have you felt little interest or pleasure in doing things? No  Have you lost interest or pleasure in daily life? No  Do you often feel hopeless? No  Do you cry easily over simple problems? No   Activities of Daily Living  In your present state of health, do you have any difficulty performing the following activities?  Driving?: No Managing money?: No Feeding yourself?:No Getting from bed to chair?:No Climbing a flight of stairs?:No Preparing food and eating?:No Bathing or showering?:No Getting dressed?:No Getting to the toilet?:No Using the toilet?:No Moving around from place to place?: No  Fall Risk Assessment In the past year have you fallen or had a near fall?:No Are you currently taking any medications that make you dizzy?:No   Hearing Difficulties: No Do you often ask people to speak up or repeat themselves?:No Do you experience ringing or noises in your ears?:No Do you have difficulty understanding soft or whispered voices?:No  Cognitive Testing  Alert? Yes Normal Appearance?Yes  Oriented to person? Yes Place? Yes    Time? Yes  Displays appropriate judgment?Yes  Can read the correct time from a watch face? yes Are you having problems remembering things?No  Advanced Directives have been discussed with the patient?Yes and brochure given, full code   List the Names of Other Physician/Practitioners you currently use: updated    Indicate any recent Medical Services you may have received from other than [facility] providers in the past year (date may be approximate).   Assessment:    Annual Wellness Exam   Plan:     Medicare Attestation  I have personally reviewed:  The patient's medical and social history  Their use of alcohol, tobacco or illicit drugs  Their current medications and supplements  The patient's functional ability including ADLs,fall risks, home safety risks, cognitive, and hearing and visual impairment  Diet and physical activities  Evidence for depression or mood disorders  The patient's weight, height, BMI, and visual acuity have been recorded in the chart. I have made referrals, counseling, and provided education to the patient based on review of the above and I have provided the patient with a written personalized care plan for preventive services.      Review of Systems     Objective:   Physical Exam  BP 146/74 mmHg  Pulse 68  Resp 18  Ht 5\' 7"  (1.702 m)  Wt 169 lb 1.9 oz (76.712 kg)  BMI 26.48 kg/m2  SpO2 98%       Assessment & Plan:  Medicare annual wellness visit, subsequent Annual exam as documented. Counseling done  re healthy lifestyle involving commitment to 150 minutes exercise per week, heart healthy diet, and attaining healthy weight.The importance of adequate sleep also discussed. Regular seat belt use and home safety, is also discussed. Changes in health habits are decided on by the patient with goals and time frames  set for achieving them. Immunization and cancer screening needs are specifically addressed at this visit.

## 2015-01-07 ENCOUNTER — Encounter (INDEPENDENT_AMBULATORY_CARE_PROVIDER_SITE_OTHER): Payer: Self-pay | Admitting: *Deleted

## 2015-01-19 NOTE — Assessment & Plan Note (Signed)
Elevated at this visit, nurse visit to re chack in 4 weeks DASH diet and commitment to daily physical activity for a minimum of 30 minutes discussed and encouraged, as a part of hypertension management. The importance of attaining a healthy weight is also discussed.  BP/Weight 01/06/2015 08/19/2014 07/29/2014 05/21/2014 05/07/2014 01/21/2014 [id number]  Systolic BP 123456 0000000 [id number] 0000000 [id number] 123456  Diastolic BP 74 86 88 57 82 70 84  Wt. (Lbs) 169.12 169 170.12 - 173 174.08 174.8  BMI 26.48 26.46 26.64 - 27.09 27.26 27.37    CMP Latest Ref Rng 01/02/2015 08/15/2014 05/03/2014  Glucose 70 - 99 mg/dL 124(H) 145(H) 147(H)  BUN 6 - 23 mg/dL 20 15 18   Creatinine 0.50 - 1.10 mg/dL 1.02 1.06 1.05  Sodium 135 - 145 mEq/L 142 138 140  Potassium 3.5 - 5.3 mEq/L 4.2 3.8 4.1  Chloride 96 - 112 mEq/L 102 101 103  CO2 19 - 32 mEq/L 31 29 30   Calcium 8.4 - 10.5 mg/dL 10.1 9.5 9.8  Total Protein 6.0 - 8.3 g/dL 6.8 - 6.7  Total Bilirubin 0.2 - 1.2 mg/dL 0.8 - 0.9  Alkaline Phos 39 - 117 U/L 70 - 86  AST 0 - 37 U/L 18 - 18  ALT 0 - 35 U/L 14 - 16

## 2015-01-27 ENCOUNTER — Ambulatory Visit: Payer: Commercial Managed Care - HMO

## 2015-01-27 VITALS — BP 152/82 | Wt 171.1 lb

## 2015-01-27 DIAGNOSIS — I10 Essential (primary) hypertension: Secondary | ICD-10-CM

## 2015-01-27 MED ORDER — CLONIDINE HCL 0.3 MG PO TABS: ORAL_TABLET | ORAL | Status: DC

## 2015-01-27 NOTE — Patient Instructions (Signed)
Follow up as before  Increase clonidine to 1.5 tabs at bedtime. I will send in the new dose.

## 2015-01-27 NOTE — Progress Notes (Signed)
States she keeps a check on her BP at home and sometimes it is good and sometimes it is up to about 160. Thinks her meds need to be adjusted   Dr will increase clonidine to 1.5 tabs at bedtime

## 2015-04-10 ENCOUNTER — Other Ambulatory Visit (INDEPENDENT_AMBULATORY_CARE_PROVIDER_SITE_OTHER): Payer: Self-pay | Admitting: *Deleted

## 2015-04-10 DIAGNOSIS — Z1211 Encounter for screening for malignant neoplasm of colon: Secondary | ICD-10-CM

## 2015-05-05 ENCOUNTER — Telehealth (INDEPENDENT_AMBULATORY_CARE_PROVIDER_SITE_OTHER): Payer: Self-pay | Admitting: *Deleted

## 2015-05-05 NOTE — Telephone Encounter (Signed)
Patient needs trilyte 

## 2015-05-07 ENCOUNTER — Telehealth (INDEPENDENT_AMBULATORY_CARE_PROVIDER_SITE_OTHER): Payer: Self-pay | Admitting: *Deleted

## 2015-05-07 ENCOUNTER — Telehealth (INDEPENDENT_AMBULATORY_CARE_PROVIDER_SITE_OTHER): Payer: Self-pay | Admitting: Internal Medicine

## 2015-05-07 MED ORDER — PEG 3350-KCL-NA BICARB-NACL 420 G PO SOLR: 4000.0000 mL | Freq: Once | ORAL | Status: DC

## 2015-05-07 NOTE — Telephone Encounter (Signed)
Message sent to [facility]

## 2015-05-07 NOTE — Telephone Encounter (Signed)
Patient needs trilyte 

## 2015-05-07 NOTE — Addendum Note (Signed)
Addended by: Worthy Keeler on: 05/07/2015 04:54 PM   Modules accepted: Orders

## 2015-05-15 ENCOUNTER — Telehealth (INDEPENDENT_AMBULATORY_CARE_PROVIDER_SITE_OTHER): Payer: Self-pay | Admitting: *Deleted

## 2015-05-15 DIAGNOSIS — E559 Vitamin D deficiency, unspecified: Secondary | ICD-10-CM | POA: Diagnosis not present

## 2015-05-15 DIAGNOSIS — I10 Essential (primary) hypertension: Secondary | ICD-10-CM | POA: Diagnosis not present

## 2015-05-15 DIAGNOSIS — E785 Hyperlipidemia, unspecified: Secondary | ICD-10-CM | POA: Diagnosis not present

## 2015-05-15 DIAGNOSIS — E119 Type 2 diabetes mellitus without complications: Secondary | ICD-10-CM | POA: Diagnosis not present

## 2015-05-15 NOTE — Telephone Encounter (Signed)
agree

## 2015-05-15 NOTE — Telephone Encounter (Signed)
Referring MD/PCP: simpson   Procedure: tcs  Reason/Indication:  screening  Has patient had this procedure before?  Yes, 10 yrs ago  If so, when, by whom and where?    Is there a family history of colon cancer?  no  Who?  What age when diagnosed?    Is patient diabetic?   yes      Does patient have prosthetic heart valve?  no  Do you have a pacemaker?  no  Has patient ever had endocarditis? no  Has patient had joint replacement within last 12 months?  no  Does patient tend to be constipated or take laxatives? no  Is patient on Coumadin, Plavix and/or Aspirin? yes  Medications: asa 81 mg daily, amlodipine 10 mg daily, atenolol 50/25 mg daily, benazepril 40 mg daily, clonidine 0.3 mg 1 1/2 tab @ bedtime, lovastatin 40 mg daily, metformin 850 mg at breakfast, centrum silver, calcium 600 + D  Allergies: nkda  Medication Adjustment: asa 2 days, hold metformin morning of procedure  Procedure date & time: 06/13/15 at 830

## 2015-05-16 LAB — TSH: TSH: 1.931 u[IU]/mL (ref 0.350–4.500)

## 2015-05-16 LAB — CBC
HCT: 36.9 % (ref 36.0–46.0)
HEMOGLOBIN: 12.3 g/dL (ref 12.0–15.0)
MCH: 30.7 pg (ref 26.0–34.0)
MCHC: 33.3 g/dL (ref 30.0–36.0)
MCV: 92 fL (ref 78.0–100.0)
MPV: 10.4 fL (ref 8.6–12.4)
Platelets: 236 10*3/uL (ref 150–400)
RBC: 4.01 MIL/uL (ref 3.87–5.11)
RDW: 13.1 % (ref 11.5–15.5)
WBC: 4.9 10*3/uL (ref 4.0–10.5)

## 2015-05-16 LAB — COMPLETE METABOLIC PANEL WITH GFR
ALT: 16 U/L (ref 6–29)
AST: 21 U/L (ref 10–35)
Albumin: 4.3 g/dL (ref 3.6–5.1)
Alkaline Phosphatase: 73 U/L (ref 33–130)
BILIRUBIN TOTAL: 0.8 mg/dL (ref 0.2–1.2)
BUN: 17 mg/dL (ref 7–25)
CALCIUM: 9.8 mg/dL (ref 8.6–10.4)
CO2: 27 meq/L (ref 20–31)
CREATININE: 1.03 mg/dL — AB (ref 0.50–0.99)
Chloride: 102 mEq/L (ref 98–110)
GFR, Est African American: 64 mL/min (ref >=60)
GFR, Est Non African American: 56 mL/min — ABNORMAL LOW (ref >=60)
GLUCOSE: 132 mg/dL — AB (ref 65–99)
Potassium: 4 mEq/L (ref 3.5–5.3)
Sodium: 139 mEq/L (ref 135–146)
TOTAL PROTEIN: 6.6 g/dL (ref 6.1–8.1)

## 2015-05-16 LAB — HEMOGLOBIN A1C
HEMOGLOBIN A1C: 7 % — AB (ref <5.7)
Mean Plasma Glucose: 154 mg/dL — ABNORMAL HIGH (ref <117)

## 2015-05-16 LAB — VITAMIN D 25 HYDROXY (VIT D DEFICIENCY, FRACTURES): Vit D, 25-Hydroxy: 46 ng/mL (ref 30–100)

## 2015-05-21 ENCOUNTER — Ambulatory Visit (INDEPENDENT_AMBULATORY_CARE_PROVIDER_SITE_OTHER): Payer: Commercial Managed Care - HMO | Admitting: Family Medicine

## 2015-05-21 ENCOUNTER — Encounter: Payer: Self-pay | Admitting: Family Medicine

## 2015-05-21 VITALS — BP 150/82 | HR 68 | Resp 18 | Ht 67.0 in | Wt 168.0 lb

## 2015-05-21 DIAGNOSIS — E119 Type 2 diabetes mellitus without complications: Secondary | ICD-10-CM | POA: Diagnosis not present

## 2015-05-21 DIAGNOSIS — Z1211 Encounter for screening for malignant neoplasm of colon: Secondary | ICD-10-CM

## 2015-05-21 DIAGNOSIS — I10 Essential (primary) hypertension: Secondary | ICD-10-CM

## 2015-05-21 DIAGNOSIS — E785 Hyperlipidemia, unspecified: Secondary | ICD-10-CM | POA: Diagnosis not present

## 2015-05-21 LAB — POC HEMOCCULT BLD/STL (OFFICE/1-CARD/DIAGNOSTIC): Fecal Occult Blood, POC: NEGATIVE

## 2015-05-21 MED ORDER — HYDROCODONE-ACETAMINOPHEN 5-325 MG PO TABS: 1.0000 | ORAL_TABLET | Freq: Every day | ORAL | Status: DC | PRN

## 2015-05-21 NOTE — Patient Instructions (Addendum)
F/u in early December, call if you need me before  Come in September for fl;u vaccine  BP today is still higher than we need ot to be, MORE REST PLEASE  Rectal today  Please work on good  health habits so that your health will improve. 1. Commitment to daily physical activity for 30 to 60  minutes, if you are able to do this.  2. Commitment to wise food choices. Aim for half of your  food intake to be vegetable and fruit, one quarter starchy foods, and one quarter protein. Try to eat on a regular schedule  3 meals per day, snacking between meals should be limited to vegetables or fruits or small portions of nuts. 64 ounces of water per day is generally recommended, unless you have specific health conditions, like heart failure or kidney failure where you will need to limit fluid intake.  3. Commitment to sufficient and a  good quality of physical and mental rest daily, generally between 6 to 8 hours per day.  WITH PERSISTANCE AND PERSEVERANCE, THE IMPOSSIBLE , BECOMES THE NORM!   Thanks for choosing [facility], we consider it a privelige to serve you.  Fasting lipid, cmp and EGFr, HBA1C, microalb end November

## 2015-06-13 ENCOUNTER — Encounter (HOSPITAL_COMMUNITY)
Admission: RE | Disposition: A | Discharge status: Home / Self Care | Payer: Self-pay | Source: Ambulatory Visit | Attending: Internal Medicine

## 2015-06-13 ENCOUNTER — Encounter (HOSPITAL_COMMUNITY): Payer: Self-pay

## 2015-06-13 ENCOUNTER — Ambulatory Visit (HOSPITAL_COMMUNITY)
Admission: RE | Admit: 2015-06-13 | Discharge: 2015-06-13 | Disposition: A | Discharge status: Home / Self Care | Payer: Commercial Managed Care - HMO | Source: Ambulatory Visit | Attending: Internal Medicine | Admitting: Internal Medicine

## 2015-06-13 DIAGNOSIS — K648 Other hemorrhoids: Secondary | ICD-10-CM | POA: Diagnosis not present

## 2015-06-13 DIAGNOSIS — Z7982 Long term (current) use of aspirin: Secondary | ICD-10-CM | POA: Insufficient documentation

## 2015-06-13 DIAGNOSIS — Z6825 Body mass index (BMI) 25.0-25.9, adult: Secondary | ICD-10-CM | POA: Insufficient documentation

## 2015-06-13 DIAGNOSIS — K6389 Other specified diseases of intestine: Secondary | ICD-10-CM | POA: Insufficient documentation

## 2015-06-13 DIAGNOSIS — Z1211 Encounter for screening for malignant neoplasm of colon: Secondary | ICD-10-CM | POA: Insufficient documentation

## 2015-06-13 DIAGNOSIS — E119 Type 2 diabetes mellitus without complications: Secondary | ICD-10-CM | POA: Diagnosis not present

## 2015-06-13 DIAGNOSIS — E785 Hyperlipidemia, unspecified: Secondary | ICD-10-CM | POA: Diagnosis not present

## 2015-06-13 DIAGNOSIS — F5105 Insomnia due to other mental disorder: Secondary | ICD-10-CM | POA: Diagnosis not present

## 2015-06-13 DIAGNOSIS — I10 Essential (primary) hypertension: Secondary | ICD-10-CM | POA: Insufficient documentation

## 2015-06-13 DIAGNOSIS — E663 Overweight: Secondary | ICD-10-CM | POA: Diagnosis not present

## 2015-06-13 DIAGNOSIS — K644 Residual hemorrhoidal skin tags: Secondary | ICD-10-CM | POA: Insufficient documentation

## 2015-06-13 DIAGNOSIS — J301 Allergic rhinitis due to pollen: Secondary | ICD-10-CM | POA: Diagnosis not present

## 2015-06-13 HISTORY — PX: COLONOSCOPY: SHX5424

## 2015-06-13 LAB — GLUCOSE, CAPILLARY: Glucose-Capillary: 143 mg/dL — ABNORMAL HIGH (ref 65–99)

## 2015-06-13 SURGERY — COLONOSCOPY
Anesthesia: Moderate Sedation

## 2015-06-13 MED ORDER — MEPERIDINE HCL 50 MG/ML IJ SOLN
INTRAMUSCULAR | Status: DC | PRN
  Administered 2015-06-13 (×2): 25 mg via INTRAVENOUS

## 2015-06-13 MED ORDER — SODIUM CHLORIDE 0.9 % IV SOLN
INTRAVENOUS | Status: DC
  Administered 2015-06-13: 08:00:00 via INTRAVENOUS

## 2015-06-13 MED ORDER — MEPERIDINE HCL 50 MG/ML IJ SOLN
INTRAMUSCULAR | Status: AC
  Filled 2015-06-13: qty 1

## 2015-06-13 MED ORDER — MIDAZOLAM HCL 5 MG/5ML IJ SOLN
INTRAMUSCULAR | Status: DC | PRN
Start: 2015-06-13 — End: 2015-06-13
  Administered 2015-06-13: 1 mg via INTRAVENOUS
  Administered 2015-06-13: 2 mg via INTRAVENOUS
  Administered 2015-06-13 (×3): 1 mg via INTRAVENOUS

## 2015-06-13 MED ORDER — MIDAZOLAM HCL 5 MG/5ML IJ SOLN
INTRAMUSCULAR | Status: AC
  Filled 2015-06-13: qty 10

## 2015-06-13 MED ORDER — STERILE WATER FOR IRRIGATION IR SOLN
Status: DC | PRN
  Administered 2015-06-13: 09:00:00

## 2015-06-13 NOTE — H&P (Signed)
Mikayla Jenkins is an 69 y.o. female.   Chief Complaint: Patient is here for colonoscopy. HPI: Patient is 69 year old African female who is in for screening colonoscopy. She denies abdominal pain change in bowel habits or rectal bleeding. Last colonoscopy was 10 years ago. Amylase is negative for CRC.  Past Medical History  Diagnosis Date  . Hyperlipidemia   . Hypertension   . Diabetes mellitus type II     Past Surgical History  Procedure Laterality Date  . Abdominal hysterectomy    . Tubal ligation    . Salpingoophorectomy    . Inguinal herniorrhapy right    . Right eye surgery secondary to right eye weakness    . Hernia repair      Family History  Problem Relation Age of Onset  . Pneumonia Brother   . Stroke Brother   . Stroke Sister   . COPD Sister   . Diabetes Sister   . Hypertension Mother    Social History:  reports that she has never smoked. She has never used smokeless tobacco. She reports that she does not drink alcohol or use illicit drugs.  Allergies: No Known Allergies  Medications Prior to Admission  Medication Sig Dispense Refill  . amLODipine (NORVASC) 10 MG tablet Take 1 tablet (10 mg total) by mouth daily. 90 tablet 1  . aspirin (ASPIRIN LOW DOSE) 81 MG EC tablet Take 81 mg by mouth daily. Take 1 tablet by mouth once a day     . atenolol-chlorthalidone (TENORETIC 50) 50-25 MG per tablet Take 1 tablet by mouth daily. 90 tablet 1  . benazepril (LOTENSIN) 40 MG tablet TAKE 1 TABLET DAILY 90 tablet 1  . Calcium Carbonate-Vitamin D (CALCIUM 600/VITAMIN D) 600-400 MG-UNIT per tablet Take 1 tablet by mouth daily.    . cloNIDine (CATAPRES) 0.3 MG tablet One and one-half tablet at bedtime 135 tablet 1  . lovastatin (MEVACOR) 40 MG tablet Take 1 tablet (40 mg total) by mouth daily. 90 tablet 1  . metFORMIN (GLUCOPHAGE) 850 MG tablet Take 1 tablet (850 mg total) by mouth daily with breakfast. 90 tablet 1  . Multiple Vitamin (MULTIVITAMIN) tablet Take 1 tablet by mouth  daily.    . polyethylene glycol-electrolytes (NULYTELY/GOLYTELY) 420 G solution Take 4,000 mLs by mouth once. 4000 mL 0  . HYDROcodone-acetaminophen (NORCO/VICODIN) 5-325 MG per tablet Take 1 tablet by mouth daily as needed for moderate pain. 30 tablet 0    Results for orders placed or performed during the hospital encounter of 06/13/15 (from the past 48 hour(s))  Glucose, capillary     Status: Abnormal   Collection Time: 06/13/15  7:51 AM  Result Value Ref Range   Glucose-Capillary 143 (H) 65 - 99 mg/dL   No results found.  ROS  Blood pressure 144/69, pulse 58, temperature 97.8 F (36.6 C), temperature source Oral, resp. rate 15, height 5' 7.5" (1.715 m), weight 168 lb (76.204 kg), SpO2 98 %. Physical Exam  Constitutional: She appears well-developed and well-nourished.  HENT:  Mouth/Throat: Oropharynx is clear and moist.  Eyes: Conjunctivae are normal. No scleral icterus.  Neck: No thyromegaly present.  Cardiovascular: Normal rate, regular rhythm and normal heart sounds.   No murmur heard. Respiratory: Effort normal and breath sounds normal.  GI: Soft. She exhibits no distension and no mass. There is no tenderness.  Musculoskeletal: She exhibits no edema.  Lymphadenopathy:    She has no cervical adenopathy.  Neurological: She is alert.  Skin: Skin is warm and  dry.     Assessment/Plan Average risk screening colonoscopy.  Jaunice Mirza U 06/13/2015, 8:32 AM

## 2015-06-13 NOTE — Op Note (Signed)
COLONOSCOPY PROCEDURE REPORT  PATIENT:  Mikayla Jenkins  MR#:  [id number] Birthdate:  07-Nov-1945, 69 y.o., female Endoscopist:  Dr. Rogene Houston, MD Referred By:  Dr. Tula Nakayama, MD Procedure Date: 06/13/2015  Procedure:   Colonoscopy  Indications:  Patient is 69 year old African-American female was undergoing average risk screening colonoscopy. Last exam was normal 10 years ago.  Informed Consent:  The procedure and risks were reviewed with the patient and informed consent was obtained.  Medications:  Demerol 50 mg IV Versed 6 mg IV  Description of procedure:  After a digital rectal exam was performed, that colonoscope was advanced from the anus through the rectum and colon to the area of the cecum, ileocecal valve and appendiceal orifice. The cecum was deeply intubated. These structures were well-seen and photographed for the record. From the level of the cecum and ileocecal valve, the scope was slowly and cautiously withdrawn. The mucosal surfaces were carefully surveyed utilizing scope tip to flexion to facilitate fold flattening as needed. The scope was pulled down into the rectum where a thorough exam including retroflexion was performed.  Findings:   Prep excellent. Redundant colon. Normal mucosa of cecum, ascending colon, hepatic flexure, transverse colon, splenic flexure, descending and sigmoid colon. Normal rectal mucosa. Small hemorrhoids above and below the dentate line.   Therapeutic/Diagnostic Maneuvers Performed:  None  Complications:  None  Cecal Withdrawal Time:  6 minutes  Impression:  Examination performed to cecum. Redundant colon with no evidence of colonic polyps. Small internal and external hemorrhoids.  Recommendations:  Standard instructions given. Next screening exam in 10 years.  Mikayla Jenkins  06/13/2015 9:42 AM  CC: Dr. Tula Nakayama, MD & Dr. Rayne Du ref. provider found

## 2015-06-13 NOTE — Discharge Instructions (Signed)
Resume usual medications and diet. No driving for 24 hours. Next screening exam in 10 years.    Colonoscopy, Care After These instructions give you information on caring for yourself after your procedure. Your doctor may also give you more specific instructions. Call your doctor if you have any problems or questions after your procedure. HOME CARE  Do not drive for 24 hours.  Do not sign important papers or use machinery for 24 hours.  You may shower.  You may go back to your usual activities, but go slower for the first 24 hours.  Take rest breaks often during the first 24 hours.  Walk around or use warm packs on your belly (abdomen) if you have belly cramping or gas.  Drink enough fluids to keep your pee (urine) clear or pale yellow.  Resume your normal diet. Avoid heavy or fried foods.  Avoid drinking alcohol for 24 hours or as told by your doctor.  Only take medicines as told by your doctor. If a tissue sample (biopsy) was taken during the procedure:   Do not take aspirin or blood thinners for 7 days, or as told by your doctor.  Do not drink alcohol for 7 days, or as told by your doctor.  Eat soft foods for the first 24 hours. GET HELP IF: You still have a small amount of blood in your poop (stool) 2-3 days after the procedure. GET HELP RIGHT AWAY IF:  You have more than a small amount of blood in your poop.  You see clumps of tissue (blood clots) in your poop.  Your belly is puffy (swollen).  You feel sick to your stomach (nauseous) or throw up (vomit).  You have a fever.  You have belly pain that gets worse and medicine does not help. MAKE SURE YOU:  Understand these instructions.  Will watch your condition.  Will get help right away if you are not doing well or get worse. Document Released: 11/06/2010 Document Revised: 10/09/2013 Document Reviewed: 06/11/2013 [facility] Patient Information 2015 [place], [place]. This information is not intended to  replace advice given to you by your health care provider. Make sure you discuss any questions you have with your health care provider.   Hemorrhoids Hemorrhoids are swollen veins around the rectum or anus. There are two types of hemorrhoids:   Internal hemorrhoids. These occur in the veins just inside the rectum. They may poke through to the outside and become irritated and painful.  External hemorrhoids. These occur in the veins outside the anus and can be felt as a painful swelling or hard lump near the anus. CAUSES  Pregnancy.   Obesity.   Constipation or diarrhea.   Straining to have a bowel movement.   Sitting for long periods on the toilet.  Heavy lifting or other activity that caused you to strain.  Anal intercourse. SYMPTOMS   Pain.   Anal itching or irritation.   Rectal bleeding.   Fecal leakage.   Anal swelling.   One or more lumps around the anus.  DIAGNOSIS  Your caregiver may be able to diagnose hemorrhoids by visual examination. Other examinations or tests that may be performed include:   Examination of the rectal area with a gloved hand (digital rectal exam).   Examination of anal canal using a small tube (scope).   A blood test if you have lost a significant amount of blood.  A test to look inside the colon (sigmoidoscopy or colonoscopy). TREATMENT Most hemorrhoids can be treated at  home. However, if symptoms do not seem to be getting better or if you have a lot of rectal bleeding, your caregiver may perform a procedure to help make the hemorrhoids get smaller or remove them completely. Possible treatments include:   Placing a rubber band at the base of the hemorrhoid to cut off the circulation (rubber band ligation).   Injecting a chemical to shrink the hemorrhoid (sclerotherapy).   Using a tool to burn the hemorrhoid (infrared light therapy).   Surgically removing the hemorrhoid (hemorrhoidectomy).   Stapling the hemorrhoid  to block blood flow to the tissue (hemorrhoid stapling).  HOME CARE INSTRUCTIONS   Eat foods with fiber, such as whole grains, beans, nuts, fruits, and vegetables. Ask your doctor about taking products with added fiber in them (fibersupplements).  Increase fluid intake. Drink enough water and fluids to keep your urine clear or pale yellow.   Exercise regularly.   Go to the bathroom when you have the urge to have a bowel movement. Do not wait.   Avoid straining to have bowel movements.   Keep the anal area dry and clean. Use wet toilet paper or moist towelettes after a bowel movement.   Medicated creams and suppositories may be used or applied as directed.   Only take over-the-counter or prescription medicines as directed by your caregiver.   Take warm sitz baths for 15-20 minutes, 3-4 times a day to ease pain and discomfort.   Place ice packs on the hemorrhoids if they are tender and swollen. Using ice packs between sitz baths may be helpful.   Put ice in a plastic bag.   Place a towel between your skin and the bag.   Leave the ice on for 15-20 minutes, 3-4 times a day.   Do not use a donut-shaped pillow or sit on the toilet for long periods. This increases blood pooling and pain.  SEEK MEDICAL CARE IF:  You have increasing pain and swelling that is not controlled by treatment or medicine.  You have uncontrolled bleeding.  You have difficulty or you are unable to have a bowel movement.  You have pain or inflammation outside the area of the hemorrhoids. MAKE SURE YOU:  Understand these instructions.  Will watch your condition.  Will get help right away if you are not doing well or get worse. Document Released: 10/01/2000 Document Revised: 09/20/2012 Document Reviewed: 08/08/2012 [facility] Patient Information 2015 [place], [place]. This information is not intended to replace advice given to you by your health care provider. Make sure you discuss any questions  you have with your health care provider.

## 2015-06-18 ENCOUNTER — Encounter (HOSPITAL_COMMUNITY): Payer: Self-pay | Admitting: Internal Medicine

## 2015-06-24 DIAGNOSIS — Z1211 Encounter for screening for malignant neoplasm of colon: Secondary | ICD-10-CM | POA: Insufficient documentation

## 2015-06-24 NOTE — Assessment & Plan Note (Signed)
Controlled, no change in medication Mikayla Jenkins is reminded of the importance of commitment to daily physical activity for 30 minutes or more, as able and the need to limit carbohydrate intake to 30 to 60 grams per meal to help with blood sugar control.   The need to take medication as prescribed, test blood sugar as directed, and to call between visits if there is a concern that blood sugar is uncontrolled is also discussed.   Mikayla Jenkins is reminded of the importance of daily foot exam, annual eye examination, and good blood sugar, blood pressure and cholesterol control.  Diabetic Labs Latest Ref Rng 05/15/2015 01/02/2015 08/15/2014 05/03/2014 12/21/2013  HbA1c <5.7 % 7.0(H) 6.6(H) 7.0(H) 7.2(H) 6.7(H)  Microalbumin <2.0 mg/dL - - 0.7 - -  Micro/Creat Ratio 0.0 - 30.0 mg/g - - 3.2 - -  Chol 0 - 200 mg/dL - 186 - 175 -  HDL >=46 mg/dL - 108 - 87 -  Calc LDL 0 - 99 mg/dL - 63 - 75 -  Triglycerides <150 mg/dL - 75 - 63 -  Creatinine 0.50 - 0.99 mg/dL 1.03(H) 1.02 1.06 1.05 0.95   BP/Weight 06/13/2015 05/21/2015 01/27/2015 01/06/2015 08/19/2014 123XX123 [id number]  Systolic BP A999333 Q000111Q 0000000 123456 0000000 [id number]  Diastolic BP 61 82 82 74 86 88 57  Wt. (Lbs) 168 168 171.12 169.12 169 170.12 -  BMI 25.91 26.31 26.8 26.48 26.46 26.64 -   Foot/eye exam completion dates Latest Ref Rng 05/21/2015 11/12/2014  Eye Exam No Retinopathy - No Retinopathy  Foot Form Completion - Done -   Controlled, no change in medication Mikayla Jenkins is reminded of the importance of commitment to daily physical activity for 30 minutes or more, as able and the need to limit carbohydrate intake to 30 to 60 grams per meal to help with blood sugar control.   The need to take medication as prescribed, test blood sugar as directed, and to call between visits if there is a concern that blood sugar is uncontrolled is also discussed.   Mikayla Jenkins is reminded of the importance of daily foot exam, annual eye examination, and good blood sugar, blood  pressure and cholesterol control.  Diabetic Labs Latest Ref Rng 05/15/2015 01/02/2015 08/15/2014 05/03/2014 12/21/2013  HbA1c <5.7 % 7.0(H) 6.6(H) 7.0(H) 7.2(H) 6.7(H)  Microalbumin <2.0 mg/dL - - 0.7 - -  Micro/Creat Ratio 0.0 - 30.0 mg/g - - 3.2 - -  Chol 0 - 200 mg/dL - 186 - 175 -  HDL >=46 mg/dL - 108 - 87 -  Calc LDL 0 - 99 mg/dL - 63 - 75 -  Triglycerides <150 mg/dL - 75 - 63 -  Creatinine 0.50 - 0.99 mg/dL 1.03(H) 1.02 1.06 1.05 0.95   BP/Weight 06/13/2015 05/21/2015 01/27/2015 01/06/2015 08/19/2014 123XX123 [id number]  Systolic BP A999333 Q000111Q 0000000 123456 0000000 [id number]  Diastolic BP 61 82 82 74 86 88 57  Wt. (Lbs) 168 168 171.12 169.12 169 170.12 -  BMI 25.91 26.31 26.8 26.48 26.46 26.64 -   Foot/eye exam completion dates Latest Ref Rng 05/21/2015 11/12/2014  Eye Exam No Retinopathy - No Retinopathy  Foot Form Completion - Done -   Controlled, no change in medication Mikayla Jenkins is reminded of the importance of commitment to daily physical activity for 30 minutes or more, as able and the need to limit carbohydrate intake to 30 to 60 grams per meal to help with blood sugar control.   The need to take medication as prescribed, test  blood sugar as directed, and to call between visits if there is a concern that blood sugar is uncontrolled is also discussed.   Mikayla Jenkins is reminded of the importance of daily foot exam, annual eye examination, and good blood sugar, blood pressure and cholesterol control.  Diabetic Labs Latest Ref Rng 05/15/2015 01/02/2015 08/15/2014 05/03/2014 12/21/2013  HbA1c <5.7 % 7.0(H) 6.6(H) 7.0(H) 7.2(H) 6.7(H)  Microalbumin <2.0 mg/dL - - 0.7 - -  Micro/Creat Ratio 0.0 - 30.0 mg/g - - 3.2 - -  Chol 0 - 200 mg/dL - 186 - 175 -  HDL >=46 mg/dL - 108 - 87 -  Calc LDL 0 - 99 mg/dL - 63 - 75 -  Triglycerides <150 mg/dL - 75 - 63 -  Creatinine 0.50 - 0.99 mg/dL 1.03(H) 1.02 1.06 1.05 0.95   BP/Weight 06/13/2015 05/21/2015 01/27/2015 01/06/2015 08/19/2014 [id number]  Systolic BP A999333  Q000111Q 0000000 123456 0000000 [id number]  Diastolic BP 61 82 82 74 86 88 57  Wt. (Lbs) 168 168 171.12 169.12 169 170.12 -  BMI 25.91 26.31 26.8 26.48 26.46 26.64 -   Foot/eye exam completion dates Latest Ref Rng 05/21/2015 11/12/2014  Eye Exam No Retinopathy - No Retinopathy  Foot Form Completion - Done -

## 2015-06-24 NOTE — Progress Notes (Signed)
Subjective:    Patient ID: Mikayla Jenkins, female    DOB: 06-14-1946, 69 y.o.   MRN: [id number]  HPI The PT is here for follow up and re-evaluation of chronic medical conditions, medication management and review of any available recent lab and radiology data.  Preventive health is updated, specifically  Cancer screening and Immunization.   Questions or concerns regarding consultations or procedures which the PT has had in the interim are  addressed. The PT denies any adverse reactions to current medications since the last visit.  There are no new concerns.  There are no specific complaints  Denies polyuria, polydipsia, blurred vision , or hypoglycemic episodes.      Review of Systems See HPI Denies recent fever or chills. Denies sinus pressure, nasal congestion, ear pain or sore throat. Denies chest congestion, productive cough or wheezing. Denies chest pains, palpitations and leg swelling Denies abdominal pain, nausea, vomiting,diarrhea or constipation.   Denies dysuria, frequency, hesitancy or incontinence. Denies joint pain, swelling and limitation in mobility. Denies headaches, seizures, numbness, or tingling. Denies depression, anxiety or insomnia. Denies skin break down or rash.        Objective:   Physical Exam BP 150/82 mmHg  Pulse 68  Resp 18  Ht 5\' 7"  (1.702 m)  Wt 168 lb (76.204 kg)  BMI 26.31 kg/m2  SpO2 99% Patient alert and oriented and in no cardiopulmonary distress.  HEENT: No facial asymmetry, EOMI,   oropharynx pink and moist.  Neck supple no JVD, no mass.  Chest: Clear to auscultation bilaterally.  CVS: S1, S2 no murmurs, no S3.Regular rate.  ABD: Soft non tender. No  Organomegaly or mass, normal bs Rectal: no mass, heme negative stool  Ext: No edema  MS: Adequate ROM spine, shoulders, hips and knees.  Skin: Intact, no ulcerations or rash noted.  Psych: Good eye contact, normal affect. Memory intact not anxious or depressed  appearing.  CNS: CN 2-12 intact, power,  normal throughout.no focal deficits noted.       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 No med change  DASH diet and commitment to daily physical activity for a minimum of 30 minutes discussed and encouraged, as a part of hypertension management. The importance of attaining a healthy weight is also discussed.  BP/Weight 06/13/2015 05/21/2015 01/27/2015 01/06/2015 08/19/2014 [id number]  Systolic BP A999333 Q000111Q 0000000 123456 0000000 [id number]  Diastolic BP 61 82 82 74 86 88 57  Wt. (Lbs) 168 168 171.12 169.12 169 170.12 -  BMI 25.91 26.31 26.8 26.48 26.46 26.64 -        Hyperlipidemia LDL goal <100 Controlled, no change in medication Hyperlipidemia:Low fat diet discussed and encouraged.   Lipid Panel  Lab Results  Component Value Date   CHOL 186 01/02/2015   HDL 108 01/02/2015   LDLCALC 63 01/02/2015   TRIG 75 01/02/2015   CHOLHDL 1.7 01/02/2015  Updated lab needed at/ before next visit.        Type 2 diabetes mellitus Controlled, no change in medication Mikayla Jenkins is reminded of the importance of commitment to daily physical activity for 30 minutes or more, as able and the need to limit carbohydrate intake to 30 to 60 grams per meal to help with blood sugar control.   The need to take medication as prescribed, test blood sugar as directed, and to call between visits if there is a  concern that blood sugar is uncontrolled is also discussed.   Mikayla Jenkins is reminded of the importance of daily foot exam, annual eye examination, and good blood sugar, blood pressure and cholesterol control.  Diabetic Labs Latest Ref Rng 05/15/2015 01/02/2015 08/15/2014 05/03/2014 12/21/2013  HbA1c <5.7 % 7.0(H) 6.6(H) 7.0(H) 7.2(H) 6.7(H)  Microalbumin <2.0 mg/dL - - 0.7 - -  Micro/Creat Ratio 0.0 - 30.0 mg/g - - 3.2 - -  Chol 0 - 200 mg/dL - 186 - 175 -  HDL >=46 mg/dL  - 108 - 87 -  Calc LDL 0 - 99 mg/dL - 63 - 75 -  Triglycerides <150 mg/dL - 75 - 63 -  Creatinine 0.50 - 0.99 mg/dL 1.03(H) 1.02 1.06 1.05 0.95   BP/Weight 06/13/2015 05/21/2015 01/27/2015 01/06/2015 08/19/2014 [id number]  Systolic BP A999333 Q000111Q 0000000 123456 0000000 [id number]  Diastolic BP 61 82 82 74 86 88 57  Wt. (Lbs) 168 168 171.12 169.12 169 170.12 -  BMI 25.91 26.31 26.8 26.48 26.46 26.64 -   Foot/eye exam completion dates Latest Ref Rng 05/21/2015 11/12/2014  Eye Exam No Retinopathy - No Retinopathy  Foot Form Completion - Done -   Controlled, no change in medication Mikayla Jenkins is reminded of the importance of commitment to daily physical activity for 30 minutes or more, as able and the need to limit carbohydrate intake to 30 to 60 grams per meal to help with blood sugar control.   The need to take medication as prescribed, test blood sugar as directed, and to call between visits if there is a concern that blood sugar is uncontrolled is also discussed.   Mikayla Jenkins is reminded of the importance of daily foot exam, annual eye examination, and good blood sugar, blood pressure and cholesterol control.  Diabetic Labs Latest Ref Rng 05/15/2015 01/02/2015 08/15/2014 05/03/2014 12/21/2013  HbA1c <5.7 % 7.0(H) 6.6(H) 7.0(H) 7.2(H) 6.7(H)  Microalbumin <2.0 mg/dL - - 0.7 - -  Micro/Creat Ratio 0.0 - 30.0 mg/g - - 3.2 - -  Chol 0 - 200 mg/dL - 186 - 175 -  HDL >=46 mg/dL - 108 - 87 -  Calc LDL 0 - 99 mg/dL - 63 - 75 -  Triglycerides <150 mg/dL - 75 - 63 -  Creatinine 0.50 - 0.99 mg/dL 1.03(H) 1.02 1.06 1.05 0.95   BP/Weight 06/13/2015 05/21/2015 01/27/2015 01/06/2015 08/19/2014 [id number]  Systolic BP A999333 Q000111Q 0000000 123456 0000000 [id number]  Diastolic BP 61 82 82 74 86 88 57  Wt. (Lbs) 168 168 171.12 169.12 169 170.12 -  BMI 25.91 26.31 26.8 26.48 26.46 26.64 -   Foot/eye exam completion dates Latest Ref Rng 05/21/2015 11/12/2014  Eye Exam No Retinopathy - No Retinopathy  Foot Form Completion - Done -    Controlled, no change in medication Mikayla Jenkins is reminded of the importance of commitment to daily physical activity for 30 minutes or more, as able and the need to limit carbohydrate intake to 30 to 60 grams per meal to help with blood sugar control.   The need to take medication as prescribed, test blood sugar as directed, and to call between visits if there is a concern that blood sugar is uncontrolled is also discussed.   Ms. Luchetti is reminded of the importance of daily foot exam, annual eye examination, and good blood sugar, blood pressure and cholesterol control.  Diabetic Labs Latest Ref Rng 05/15/2015 01/02/2015 08/15/2014 05/03/2014 12/21/2013  HbA1c <5.7 % 7.0(H) 6.6(H) 7.0(H) 7.2(H) 6.7(H)  Microalbumin <2.0 mg/dL - - 0.7 - -  Micro/Creat Ratio 0.0 - 30.0 mg/g - - 3.2 - -  Chol 0 - 200 mg/dL - 186 - 175 -  HDL >=46 mg/dL - 108 - 87 -  Calc LDL 0 - 99 mg/dL - 63 - 75 -  Triglycerides <150 mg/dL - 75 - 63 -  Creatinine 0.50 - 0.99 mg/dL 1.03(H) 1.02 1.06 1.05 0.95   BP/Weight 06/13/2015 05/21/2015 01/27/2015 01/06/2015 08/19/2014 [id number]  Systolic BP A999333 Q000111Q 0000000 123456 0000000 [id number]  Diastolic BP 61 82 82 74 86 88 57  Wt. (Lbs) 168 168 171.12 169.12 169 170.12 -  BMI 25.91 26.31 26.8 26.48 26.46 26.64 -   Foot/eye exam completion dates Latest Ref Rng 05/21/2015 11/12/2014  Eye Exam No Retinopathy - No Retinopathy  Foot Form Completion - Done -                 Special screening for malignant neoplasms, colon No rectal mass and heme negative stool

## 2015-06-24 NOTE — Assessment & Plan Note (Signed)
Controlled, no change in medication Hyperlipidemia:Low fat diet discussed and encouraged.   Lipid Panel  Lab Results  Component Value Date   CHOL 186 01/02/2015   HDL 108 01/02/2015   LDLCALC 63 01/02/2015   TRIG 75 01/02/2015   CHOLHDL 1.7 01/02/2015  Updated lab needed at/ before next visit.

## 2015-06-24 NOTE — Assessment & Plan Note (Signed)
No rectal mass and heme negative stool 

## 2015-06-24 NOTE — Assessment & Plan Note (Signed)
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 No med change  DASH diet and commitment to daily physical activity for a minimum of 30 minutes discussed and encouraged, as a part of hypertension management. The importance of attaining a healthy weight is also discussed.  BP/Weight 06/13/2015 05/21/2015 01/27/2015 01/06/2015 08/19/2014 [id number]  Systolic BP A999333 Q000111Q 0000000 123456 0000000 [id number]  Diastolic BP 61 82 82 74 86 88 57  Wt. (Lbs) 168 168 171.12 169.12 169 170.12 -  BMI 25.91 26.31 26.8 26.48 26.46 26.64 -

## 2015-06-30 ENCOUNTER — Encounter: Payer: Self-pay | Admitting: Family Medicine

## 2015-06-30 ENCOUNTER — Ambulatory Visit (INDEPENDENT_AMBULATORY_CARE_PROVIDER_SITE_OTHER): Payer: Commercial Managed Care - HMO | Admitting: Family Medicine

## 2015-06-30 VITALS — BP 126/84 | HR 66 | Resp 18 | Ht 67.0 in | Wt 166.1 lb

## 2015-06-30 DIAGNOSIS — N8110 Cystocele, unspecified: Secondary | ICD-10-CM | POA: Insufficient documentation

## 2015-06-30 DIAGNOSIS — Z23 Encounter for immunization: Secondary | ICD-10-CM | POA: Diagnosis not present

## 2015-06-30 DIAGNOSIS — I10 Essential (primary) hypertension: Secondary | ICD-10-CM

## 2015-06-30 DIAGNOSIS — N3091 Cystitis, unspecified with hematuria: Secondary | ICD-10-CM | POA: Insufficient documentation

## 2015-06-30 DIAGNOSIS — M549 Dorsalgia, unspecified: Secondary | ICD-10-CM

## 2015-06-30 DIAGNOSIS — N3000 Acute cystitis without hematuria: Secondary | ICD-10-CM | POA: Diagnosis not present

## 2015-06-30 DIAGNOSIS — E119 Type 2 diabetes mellitus without complications: Secondary | ICD-10-CM

## 2015-06-30 DIAGNOSIS — N3001 Acute cystitis with hematuria: Secondary | ICD-10-CM

## 2015-06-30 LAB — POCT URINALYSIS DIPSTICK
Bilirubin, UA: NEGATIVE
Glucose, UA: NEGATIVE
Ketones, UA: NEGATIVE
Nitrite, UA: NEGATIVE
PH UA: 6
PROTEIN UA: NEGATIVE
Spec Grav, UA: 1.02
UROBILINOGEN UA: 0.2

## 2015-06-30 MED ORDER — METHYLPREDNISOLONE ACETATE 80 MG/ML IJ SUSP
80.0000 mg | Freq: Once | INTRAMUSCULAR | Status: AC
  Administered 2015-06-30: 80 mg via INTRAMUSCULAR

## 2015-06-30 MED ORDER — CYCLOBENZAPRINE HCL 10 MG PO TABS: 10.0000 mg | ORAL_TABLET | Freq: Every evening | ORAL | Status: DC | PRN

## 2015-06-30 MED ORDER — KETOROLAC TROMETHAMINE 60 MG/2ML IM SOLN
60.0000 mg | Freq: Once | INTRAMUSCULAR | Status: AC
  Administered 2015-06-30: 60 mg via INTRAMUSCULAR

## 2015-06-30 MED ORDER — IBUPROFEN 600 MG PO TABS: 600.0000 mg | ORAL_TABLET | Freq: Three times a day (TID) | ORAL | Status: DC | PRN

## 2015-06-30 MED ORDER — PREDNISONE 5 MG (21) PO TBPK: ORAL_TABLET | ORAL | Status: DC

## 2015-06-30 NOTE — Assessment & Plan Note (Signed)
Uncontrolled.Toradol and depo medrol administered IM in the office , to be followed by a short course of oral prednisone and NSAIDS.  

## 2015-06-30 NOTE — Assessment & Plan Note (Signed)
Symptomatic with vaginal pressure, also has possible UTI. Will hold off of urology eval at this time, call if she changes her mind on this

## 2015-06-30 NOTE — Progress Notes (Signed)
Subjective:    Patient ID: Mikayla Jenkins, female    DOB: 1946/09/30, 69 y.o.   MRN: [id number]  HPI 2 weeks ago pt had acute LBP radiaiting to vaginal; area, not as severe  Lasted about 2 days  5 days ago she bent to get clothes from dryer, constant pain rated at a 9 to 10 from low back to vaginal area , sitting is uncomfortable, if she turns to left when lying painful, and notes post void pain. No fevr , chills or flank pain C/o low back spasm   Review of Systems See HPI Denies recent fever or chills. Denies sinus pressure, nasal congestion, ear pain or sore throat. Denies chest congestion, productive cough or wheezing. Denies chest pains, palpitations and leg swelling Denies abdominal pain, nausea, vomiting,diarrhea or constipation.     Denies headaches, seizures, numbness, or tingling. Denies depression, anxiety or insomnia. Denies skin break down or rash.        Objective:   Physical Exam BP 126/84 mmHg  Pulse 66  Resp 18  Ht 5\' 7"  (1.702 m)  Wt 166 lb 1.3 oz (75.333 kg)  BMI 26.01 kg/m2  SpO2 98% Patient alert and oriented and in no cardiopulmonary distress.Pt in pain  HEENT: No facial asymmetry, EOMI,   oropharynx pink and moist.  Neck supple no JVD, no mass.  Chest: Clear to auscultation bilaterally.  CVS: S1, S2 no murmurs, no S3.Regular rate.  ABD: Soft no  Flank tenderness, mild suprapubic tenderness.  Pelvic: external genitalia and vaginal walls normal,  no ulcer or erythema, physiologic d/c Bladder prolapse down 2/3 of introitus  Ext: No edema  MS: Decreased ROM lumbar spine, adequate in  shoulders, hips and knees.  Skin: Intact, no ulcerations or rash noted.  Psych: Good eye contact, normal affect. Memory intact not anxious or depressed appearing.  CNS: CN 2-12 intact, power,  normal throughout.no focal deficits noted.        Assessment & Plan:  Back pain with radiation Uncontrolled.Toradol and depo medrol administered IM in the office ,  to be followed by a short course of oral prednisone and NSAIDS.   Female bladder prolapse Symptomatic with vaginal pressure, also has possible UTI. Will hold off of urology eval at this time, call if she changes her mind on this  Essential hypertension Controlled, no change in medication DASH diet and commitment to daily physical activity for a minimum of 30 minutes discussed and encouraged, as a part of hypertension management. The importance of attaining a healthy weight is also discussed.  BP/Weight 06/30/2015 06/13/2015 05/21/2015 01/27/2015 01/06/2015 08/19/2014 [id number]  Systolic BP 123XX123 A999333 Q000111Q 0000000 123456 0000000 [id number]  Diastolic BP 84 61 82 82 74 86 88  Wt. (Lbs) 166.08 168 168 171.12 169.12 169 170.12  BMI 26.01 25.91 26.31 26.8 26.48 26.46 26.64        Type 2 diabetes mellitus Controlled, no change in medication Ms. Bohlinger is reminded of the importance of commitment to daily physical activity for 30 minutes or more, as able and the need to limit carbohydrate intake to 30 to 60 grams per meal to help with blood sugar control.   The need to take medication as prescribed, test blood sugar as directed, and to call between visits if there is a concern that blood sugar is uncontrolled is also discussed.   Ms. Perini is reminded of the importance of daily foot exam, annual eye examination, and good blood sugar, blood pressure and cholesterol control.  Diabetic Labs Latest Ref Rng 05/15/2015 01/02/2015 08/15/2014 05/03/2014 12/21/2013  HbA1c <5.7 % 7.0(H) 6.6(H) 7.0(H) 7.2(H) 6.7(H)  Microalbumin <2.0 mg/dL - - 0.7 - -  Micro/Creat Ratio 0.0 - 30.0 mg/g - - 3.2 - -  Chol 0 - 200 mg/dL - 186 - 175 -  HDL >=46 mg/dL - 108 - 87 -  Calc LDL 0 - 99 mg/dL - 63 - 75 -  Triglycerides <150 mg/dL - 75 - 63 -  Creatinine 0.50 - 0.99 mg/dL 1.03(H) 1.02 1.06 1.05 0.95   BP/Weight 06/30/2015 06/13/2015 05/21/2015 01/27/2015 01/06/2015 08/19/2014 [id number]  Systolic BP 123XX123 A999333 Q000111Q 0000000 123456 0000000 [id number]  Diastolic BP 84  61 82 82 74 86 88  Wt. (Lbs) 166.08 168 168 171.12 169.12 169 170.12  BMI 26.01 25.91 26.31 26.8 26.48 26.46 26.64   Foot/eye exam completion dates Latest Ref Rng 05/21/2015 11/12/2014  Eye Exam No Retinopathy - No Retinopathy  Foot Form Completion - Done -         Need for prophylactic vaccination and inoculation against influenza After obtaining informed consent, the vaccine is  administered by LPN.   Acute cystitis with hematuria Mild post void pressure and abn UA, no fever or chills. Send for c/s hold on antibiotic

## 2015-06-30 NOTE — Assessment & Plan Note (Signed)
After obtaining informed consent, the vaccine is  administered by LPN.  

## 2015-06-30 NOTE — Assessment & Plan Note (Signed)
Mild post void pressure and abn UA, no fever or chills. Send for c/s hold on antibiotic

## 2015-06-30 NOTE — Assessment & Plan Note (Signed)
Controlled, no change in medication DASH diet and commitment to daily physical activity for a minimum of 30 minutes discussed and encouraged, as a part of hypertension management. The importance of attaining a healthy weight is also discussed.  BP/Weight 06/30/2015 06/13/2015 05/21/2015 01/27/2015 01/06/2015 08/19/2014 123XX123  Systolic BP 123XX123 A999333 Q000111Q 0000000 123456 0000000 [id number]  Diastolic BP 84 61 82 82 74 86 88  Wt. (Lbs) 166.08 168 168 171.12 169.12 169 170.12  BMI 26.01 25.91 26.31 26.8 26.48 26.46 26.64

## 2015-06-30 NOTE — Assessment & Plan Note (Signed)
Controlled, no change in medication Mikayla Jenkins is reminded of the importance of commitment to daily physical activity for 30 minutes or more, as able and the need to limit carbohydrate intake to 30 to 60 grams per meal to help with blood sugar control.   The need to take medication as prescribed, test blood sugar as directed, and to call between visits if there is a concern that blood sugar is uncontrolled is also discussed.   Mikayla Jenkins is reminded of the importance of daily foot exam, annual eye examination, and good blood sugar, blood pressure and cholesterol control.  Diabetic Labs Latest Ref Rng 05/15/2015 01/02/2015 08/15/2014 05/03/2014 12/21/2013  HbA1c <5.7 % 7.0(H) 6.6(H) 7.0(H) 7.2(H) 6.7(H)  Microalbumin <2.0 mg/dL - - 0.7 - -  Micro/Creat Ratio 0.0 - 30.0 mg/g - - 3.2 - -  Chol 0 - 200 mg/dL - 186 - 175 -  HDL >=46 mg/dL - 108 - 87 -  Calc LDL 0 - 99 mg/dL - 63 - 75 -  Triglycerides <150 mg/dL - 75 - 63 -  Creatinine 0.50 - 0.99 mg/dL 1.03(H) 1.02 1.06 1.05 0.95   BP/Weight 06/30/2015 06/13/2015 05/21/2015 01/27/2015 01/06/2015 08/19/2014 123XX123  Systolic BP 123XX123 A999333 Q000111Q 0000000 123456 0000000 123XX123  Diastolic BP 84 61 82 82 74 86 88  Wt. (Lbs) 166.08 168 168 171.12 169.12 169 170.12  BMI 26.01 25.91 26.31 26.8 26.48 26.46 26.64   Foot/eye exam completion dates Latest Ref Rng 05/21/2015 11/12/2014  Eye Exam No Retinopathy - No Retinopathy  Foot Form Completion - Done -

## 2015-06-30 NOTE — Patient Instructions (Addendum)
F/u as before  Flu vaccine today  Call if you need me sooner  I believe that your pain is from the arthritis and disc duisease you have i9n your spine, medications will be administered and sent in  You mAY have a urinary tract infection, when result is available in next 3 to 4 days you will be contacted , no antibiotics today  Bladder has "fallen" as pelvic muscles have weakened  Flu vaccine today.  If this continues to be a problem call and let me know to refer you topo urology, there are surgical options available for this

## 2015-07-04 ENCOUNTER — Other Ambulatory Visit: Payer: Self-pay | Admitting: Family Medicine

## 2015-07-04 LAB — URINE CULTURE: Colony Count: >=100000

## 2015-07-04 MED ORDER — CIPROFLOXACIN HCL 500 MG PO TABS: 500.0000 mg | ORAL_TABLET | Freq: Two times a day (BID) | ORAL | Status: DC

## 2015-07-28 ENCOUNTER — Other Ambulatory Visit: Payer: Self-pay | Admitting: Family Medicine

## 2015-08-15 ENCOUNTER — Other Ambulatory Visit: Payer: Self-pay | Admitting: Family Medicine

## 2015-09-02 ENCOUNTER — Other Ambulatory Visit: Payer: Self-pay | Admitting: Family Medicine

## 2015-09-03 ENCOUNTER — Other Ambulatory Visit: Payer: Self-pay | Admitting: Family Medicine

## 2015-09-03 DIAGNOSIS — Z1231 Encounter for screening mammogram for malignant neoplasm of breast: Secondary | ICD-10-CM

## 2015-09-08 ENCOUNTER — Ambulatory Visit (HOSPITAL_COMMUNITY)
Admission: RE | Admit: 2015-09-08 | Discharge: 2015-09-08 | Disposition: A | Discharge status: Home / Self Care | Payer: Commercial Managed Care - HMO | Source: Ambulatory Visit | Attending: Family Medicine | Admitting: Family Medicine

## 2015-09-08 DIAGNOSIS — Z1231 Encounter for screening mammogram for malignant neoplasm of breast: Secondary | ICD-10-CM | POA: Diagnosis not present

## 2015-09-18 ENCOUNTER — Other Ambulatory Visit: Payer: Self-pay | Admitting: Family Medicine

## 2015-09-18 DIAGNOSIS — E785 Hyperlipidemia, unspecified: Secondary | ICD-10-CM | POA: Diagnosis not present

## 2015-09-18 DIAGNOSIS — E119 Type 2 diabetes mellitus without complications: Secondary | ICD-10-CM | POA: Diagnosis not present

## 2015-09-19 LAB — LIPID PANEL
CHOL/HDL RATIO: 1.9 ratio (ref <=5.0)
Cholesterol: 185 mg/dL (ref 125–200)
HDL: 96 mg/dL (ref >=46)
LDL CALC: 73 mg/dL (ref <130)
Triglycerides: 81 mg/dL (ref <150)
VLDL: 16 mg/dL (ref <30)

## 2015-09-19 LAB — MICROALBUMIN / CREATININE URINE RATIO
Creatinine, Urine: 127 mg/dL (ref 20–320)
MICROALB UR: 0.3 mg/dL
Microalb Creat Ratio: 2 mcg/mg creat (ref <30)

## 2015-09-19 LAB — COMPLETE METABOLIC PANEL WITH GFR
ALK PHOS: 81 U/L (ref 33–130)
ALT: 13 U/L (ref 6–29)
AST: 17 U/L (ref 10–35)
Albumin: 4.1 g/dL (ref 3.6–5.1)
BILIRUBIN TOTAL: 0.8 mg/dL (ref 0.2–1.2)
BUN: 18 mg/dL (ref 7–25)
CO2: 30 mmol/L (ref 20–31)
Calcium: 9.6 mg/dL (ref 8.6–10.4)
Chloride: 103 mmol/L (ref 98–110)
Creat: 0.98 mg/dL — ABNORMAL HIGH (ref 0.60–0.93)
GFR, EST AFRICAN AMERICAN: 68 mL/min (ref >=60)
GFR, EST NON AFRICAN AMERICAN: 59 mL/min — AB (ref >=60)
GLUCOSE: 142 mg/dL — AB (ref 65–99)
POTASSIUM: 3.8 mmol/L (ref 3.5–5.3)
SODIUM: 140 mmol/L (ref 135–146)
Total Protein: 6.7 g/dL (ref 6.1–8.1)

## 2015-09-19 LAB — HEMOGLOBIN A1C
HEMOGLOBIN A1C: 7.3 % — AB (ref <5.7)
MEAN PLASMA GLUCOSE: 163 mg/dL — AB (ref <117)

## 2015-09-25 ENCOUNTER — Encounter: Payer: Self-pay | Admitting: Family Medicine

## 2015-09-25 ENCOUNTER — Ambulatory Visit (INDEPENDENT_AMBULATORY_CARE_PROVIDER_SITE_OTHER): Payer: Commercial Managed Care - HMO | Admitting: Family Medicine

## 2015-09-25 VITALS — BP 158/64 | HR 77 | Resp 16 | Ht 67.0 in | Wt 163.0 lb

## 2015-09-25 DIAGNOSIS — M25551 Pain in right hip: Secondary | ICD-10-CM | POA: Diagnosis not present

## 2015-09-25 DIAGNOSIS — E785 Hyperlipidemia, unspecified: Secondary | ICD-10-CM

## 2015-09-25 DIAGNOSIS — E1169 Type 2 diabetes mellitus with other specified complication: Secondary | ICD-10-CM | POA: Diagnosis not present

## 2015-09-25 DIAGNOSIS — Z1159 Encounter for screening for other viral diseases: Secondary | ICD-10-CM | POA: Diagnosis not present

## 2015-09-25 DIAGNOSIS — I10 Essential (primary) hypertension: Secondary | ICD-10-CM | POA: Diagnosis not present

## 2015-09-25 DIAGNOSIS — M5431 Sciatica, right side: Secondary | ICD-10-CM

## 2015-09-25 MED ORDER — CLONIDINE HCL 0.3 MG PO TABS: ORAL_TABLET | ORAL | Status: DC

## 2015-09-25 MED ORDER — PREDNISONE 5 MG PO TABS: 5.0000 mg | ORAL_TABLET | Freq: Two times a day (BID) | ORAL | Status: AC

## 2015-09-25 NOTE — Patient Instructions (Addendum)
Annual wellness in 4 month, call if you need me sooner  Excellent labs, eat half the number of prunes  Non fast labs in 4 months  5 day course of prednisone sent to pharmacy due to right hip pain since yesterday  All the best for 2017!  Thanks for choosing [facility], we consider it a privelige to serve you.

## 2015-09-25 NOTE — Progress Notes (Signed)
Subjective:    Patient ID: Mikayla Jenkins, female    DOB: 1946-07-29, 69 y.o.   MRN: [id number]  HPI   Mikayla Jenkins     MRN: [id number]      DOB: [birth year]   HPI Mikayla Jenkins is here for follow up and re-evaluation of chronic medical conditions, medication management and review of any available recent lab and radiology data.  Preventive health is updated, specifically  Cancer screening and Immunization.   Questions or concerns regarding consultations or procedures which the PT has had in the interim are  addressed. The PT denies any adverse reactions to current medications since the last visit.  Denies polyuria, polydipsia, blurred vision , or hypoglycemic episodes. 2 day h/o right hip pain , better this morning, no known trauma  ROS Denies recent fever or chills. Denies sinus pressure, nasal congestion, ear pain or sore throat. Denies chest congestion, productive cough or wheezing. Denies chest pains, palpitations and leg swelling Denies abdominal pain, nausea, vomiting,diarrhea or constipation.   Denies dysuria, frequency, hesitancy or incontinence.  Denies headaches, seizures, numbness, or tingling. Denies depression, anxiety or insomnia. Denies skin break down or rash.   PE  BP 150/84 mmHg  Pulse 77  Resp 16  Ht 5\' 7"  (1.702 m)  Wt 163 lb (73.936 kg)  BMI 25.52 kg/m2  SpO2 98%  Patient alert and oriented and in no cardiopulmonary distress.  HEENT: No facial asymmetry, EOMI,   oropharynx pink and moist.  Neck supple no JVD, no mass.  Chest: Clear to auscultation bilaterally.  CVS: S1, S2 no murmurs, no S3.Regular rate.  ABD: Soft non tender.   Ext: No edema  MS: decreased  ROM lumbar  spine, and right , hip adequate in left hip, both knees and both shoulders.  Skin: Intact, no ulcerations or rash noted.  Psych: Good eye contact, normal affect. Memory intact not anxious or depressed appearing.  CNS: CN 2-12 intact, power,  normal throughout.no focal deficits  noted.   Assessment & Plan   Essential hypertension UnControlled, running out of clonidone and taking less than prescribed dose no change in medication, 1 week su[pply sen locally and long distant script also sent DASH diet and commitment to daily physical activity for a minimum of 30 minutes discussed and encouraged, as a part of hypertension management. The importance of attaining a healthy weight is also discussed.  DASH diet and commitment to daily physical activity for a minimum of 30 minutes discussed and encouraged, as a part of hypertension management. The importance of attaining a healthy weight is also discussed.  BP/Weight 09/25/2015 06/30/2015 06/13/2015 05/21/2015 01/27/2015 01/06/2015 0000000  Systolic BP 0000000 123XX123 A999333 Q000111Q 0000000 123456 0000000  Diastolic BP 64 84 61 82 82 74 86  Wt. (Lbs) 163 166.08 168 168 171.12 169.12 169  BMI 25.52 26.01 25.91 26.31 26.8 26.48 26.46            Type 2 diabetes mellitus Controlled, no change in medication Needs to reduce # prunes eaten at night Mikayla Jenkins is reminded of the importance of commitment to daily physical activity for 30 minutes or more, as able and the need to limit carbohydrate intake to 30 to 60 grams per meal to help with blood sugar control.   The need to take medication as prescribed, test blood sugar as directed, and to call between visits if there is a concern that blood sugar is uncontrolled is also discussed.   Mikayla Jenkins is reminded of  the importance of daily foot exam, annual eye examination, and good blood sugar, blood pressure and cholesterol control.  Diabetic Labs Latest Ref Rng 09/18/2015 05/15/2015 01/02/2015 08/15/2014 05/03/2014  HbA1c <5.7 % 7.3(H) 7.0(H) 6.6(H) 7.0(H) 7.2(H)  Microalbumin Not estab mg/dL 0.3 - - 0.7 -  Micro/Creat Ratio <30 mcg/mg creat 2 - - 3.2 -  Chol 125 - 200 mg/dL 185 - 186 - 175  HDL >=46 mg/dL 96 - 108 - 87  Calc LDL <130 mg/dL 73 - 63 - 75  Triglycerides <150 mg/dL 81 - 75 - 63    Creatinine 0.60 - 0.93 mg/dL 0.98(H) 1.03(H) 1.02 1.06 1.05   BP/Weight 09/25/2015 06/30/2015 06/13/2015 05/21/2015 01/27/2015 01/06/2015 0000000  Systolic BP 0000000 123XX123 A999333 Q000111Q 0000000 123456 0000000  Diastolic BP 70 84 61 82 82 74 86  Wt. (Lbs) 163 166.08 168 168 171.12 169.12 169  BMI 25.52 26.01 25.91 26.31 26.8 26.48 26.46   Foot/eye exam completion dates Latest Ref Rng 05/21/2015 11/12/2014  Eye Exam No Retinopathy - No Retinopathy  Foot Form Completion - Done -         Sciatica of right side New acute right hip pain like;y due to sciatica, short course of prednisone prescribed  Right hip pain 2 day history, prednisone for 5 days prescribed  Hyperlipidemia LDL goal <100 Controlled, no change in medication Hyperlipidemia:Low fat diet discussed and encouraged.   Lipid Panel  Lab Results  Component Value Date   CHOL 185 09/18/2015   HDL 96 09/18/2015   LDLCALC 73 09/18/2015   TRIG 81 09/18/2015   CHOLHDL 1.9 09/18/2015              Review of Systems     Objective:   Physical Exam        Assessment & Plan:

## 2015-09-28 NOTE — Assessment & Plan Note (Signed)
Controlled, no change in medication Hyperlipidemia:Low fat diet discussed and encouraged.   Lipid Panel  Lab Results  Component Value Date   CHOL 185 09/18/2015   HDL 96 09/18/2015   LDLCALC 73 09/18/2015   TRIG 81 09/18/2015   CHOLHDL 1.9 09/18/2015

## 2015-09-28 NOTE — Assessment & Plan Note (Signed)
Controlled, no change in medication Needs to reduce # prunes eaten at night [name] is reminded of the importance of commitment to daily physical activity for 30 minutes or more, as able and the need to limit carbohydrate intake to 30 to 60 grams per meal to help with blood sugar control.   The need to take medication as prescribed, test blood sugar as directed, and to call between visits if there is a concern that blood sugar is uncontrolled is also discussed.   [name] is reminded of the importance of daily foot exam, annual eye examination, and good blood sugar, blood pressure and cholesterol control.  Diabetic Labs Latest Ref Rng 09/18/2015 05/15/2015 01/02/2015 08/15/2014 05/03/2014  HbA1c <5.7 % 7.3(H) 7.0(H) 6.6(H) 7.0(H) 7.2(H)  Microalbumin Not estab mg/dL 0.3 - - 0.7 -  Micro/Creat Ratio <30 mcg/mg creat 2 - - 3.2 -  Chol 125 - 200 mg/dL 185 - 186 - 175  HDL >=46 mg/dL 96 - 108 - 87  Calc LDL <130 mg/dL 73 - 63 - 75  Triglycerides <150 mg/dL 81 - 75 - 63  Creatinine 0.60 - 0.93 mg/dL 0.98(H) 1.03(H) 1.02 1.06 1.05   BP/Weight 09/25/2015 06/30/2015 06/13/2015 05/21/2015 01/27/2015 01/06/2015 0000000  Systolic BP 0000000 123XX123 A999333 Q000111Q 0000000 123456 0000000  Diastolic BP 70 84 61 82 82 74 86  Wt. (Lbs) 163 166.08 168 168 171.12 169.12 169  BMI 25.52 26.01 25.91 26.31 26.8 26.48 26.46   Foot/eye exam completion dates Latest Ref Rng 05/21/2015 11/12/2014  Eye Exam No Retinopathy - No Retinopathy  Foot Form Completion - Done -

## 2015-09-28 NOTE — Assessment & Plan Note (Signed)
2 day history, prednisone for 5 days prescribed

## 2015-09-28 NOTE — Assessment & Plan Note (Addendum)
UnControlled, running out of clonidone and taking less than prescribed dose no change in medication, 1 week su[pply sen locally and long distant script also sent DASH diet and commitment to daily physical activity for a minimum of 30 minutes discussed and encouraged, as a part of hypertension management. The importance of attaining a healthy weight is also discussed.  DASH diet and commitment to daily physical activity for a minimum of 30 minutes discussed and encouraged, as a part of hypertension management. The importance of attaining a healthy weight is also discussed.  BP/Weight 09/25/2015 06/30/2015 06/13/2015 05/21/2015 01/27/2015 01/06/2015 0000000  Systolic BP 0000000 123XX123 A999333 Q000111Q 0000000 123456 0000000  Diastolic BP 64 84 61 82 82 74 86  Wt. (Lbs) 163 166.08 168 168 171.12 169.12 169  BMI 25.52 26.01 25.91 26.31 26.8 26.48 26.46

## 2015-09-28 NOTE — Assessment & Plan Note (Signed)
New acute right hip pain like;y due to sciatica, short course of prednisone prescribed

## 2015-10-16 ENCOUNTER — Other Ambulatory Visit: Payer: Self-pay | Admitting: Family Medicine

## 2016-01-19 ENCOUNTER — Other Ambulatory Visit: Payer: Self-pay | Admitting: Family Medicine

## 2016-01-22 DIAGNOSIS — Z1159 Encounter for screening for other viral diseases: Secondary | ICD-10-CM | POA: Diagnosis not present

## 2016-01-22 DIAGNOSIS — E1169 Type 2 diabetes mellitus with other specified complication: Secondary | ICD-10-CM | POA: Diagnosis not present

## 2016-01-23 LAB — HEMOGLOBIN A1C
Hgb A1c MFr Bld: 7 % — ABNORMAL HIGH (ref <5.7)
MEAN PLASMA GLUCOSE: 154 mg/dL

## 2016-01-23 LAB — BASIC METABOLIC PANEL WITH GFR
BUN: 19 mg/dL (ref 7–25)
CHLORIDE: 103 mmol/L (ref 98–110)
CO2: 29 mmol/L (ref 20–31)
CREATININE: 1.07 mg/dL — AB (ref 0.60–0.93)
Calcium: 10.2 mg/dL (ref 8.6–10.4)
GFR, Est African American: 61 mL/min (ref >=60)
GFR, Est Non African American: 53 mL/min — ABNORMAL LOW (ref >=60)
Glucose, Bld: 133 mg/dL — ABNORMAL HIGH (ref 65–99)
Potassium: 4 mmol/L (ref 3.5–5.3)
SODIUM: 140 mmol/L (ref 135–146)

## 2016-01-23 LAB — HEPATITIS C ANTIBODY: HCV Ab: NEGATIVE

## 2016-01-27 ENCOUNTER — Encounter: Payer: Self-pay | Admitting: Family Medicine

## 2016-01-27 ENCOUNTER — Ambulatory Visit (INDEPENDENT_AMBULATORY_CARE_PROVIDER_SITE_OTHER): Payer: Commercial Managed Care - HMO | Admitting: Family Medicine

## 2016-01-27 VITALS — BP 130/80 | HR 61 | Resp 16 | Ht 67.0 in | Wt 158.0 lb

## 2016-01-27 DIAGNOSIS — I10 Essential (primary) hypertension: Secondary | ICD-10-CM

## 2016-01-27 DIAGNOSIS — E785 Hyperlipidemia, unspecified: Secondary | ICD-10-CM | POA: Diagnosis not present

## 2016-01-27 DIAGNOSIS — M5431 Sciatica, right side: Secondary | ICD-10-CM

## 2016-01-27 DIAGNOSIS — E1169 Type 2 diabetes mellitus with other specified complication: Secondary | ICD-10-CM | POA: Diagnosis not present

## 2016-01-27 NOTE — Patient Instructions (Signed)
Wellness visit 3rd week in August  Fasting  labs first week in August  Improved blood pressure and blood  Sugar  Need to eat on regular schedule , healthy food choices and continue physical activity 30 mins every day  Thank you  for choosing [facility]. We consider it a privelige to serve you.  Delivering excellent health care in a caring and  compassionate way is our goal.  Partnering with you,  so that together we can achieve this goal is our strategy.

## 2016-01-27 NOTE — Progress Notes (Signed)
Subjective:    Patient ID: Mikayla Jenkins, female    DOB: 1946/06/10, 70 y.o.   MRN: [id number]  HPI   Mikayla Jenkins     MRN: [id number]      DOB: [birth year]   HPI Mikayla Jenkins is here for follow up and re-evaluation of chronic medical conditions, medication management and review of any available recent lab and radiology data.  Preventive health is updated, specifically  Cancer screening and Immunization.   Questions or concerns regarding consultations or procedures which the PT has had in the interim are  addressed. The PT denies any adverse reactions to current medications since the last visit.  There are no new concerns.  There are no specific complaints  Denies polyuria, polydipsia, blurred vision , or hypoglycemic episodes.   ROS Denies recent fever or chills. Denies sinus pressure, nasal congestion, ear pain or sore throat. Denies chest congestion, productive cough or wheezing. Denies chest pains, palpitations and leg swelling Denies abdominal pain, nausea, vomiting,diarrhea or constipation.   Denies dysuria, frequency, hesitancy or incontinence. Denies joint pain, swelling and limitation in mobility. Denies headaches, seizures, numbness, or tingling. Denies depression, anxiety or insomnia. Denies skin break down or rash.   PE  BP 130/80 mmHg  Pulse 61  Resp 16  Ht 5\' 7"  (1.702 m)  Wt 158 lb (71.668 kg)  BMI 24.74 kg/m2  SpO2 99%  Patient alert and oriented and in no cardiopulmonary distress.  HEENT: No facial asymmetry, EOMI,   oropharynx pink and moist.  Neck supple no JVD, no mass.  Chest: Clear to auscultation bilaterally.  CVS: S1, S2 no murmurs, no S3.Regular rate.  ABD: Soft non tender.   Ext: No edema  MS: Adequate ROM spine, shoulders, hips and knees.  Skin: Intact, no ulcerations or rash noted.  Psych: Good eye contact, normal affect. Memory intact not anxious or depressed appearing.  CNS: CN 2-12 intact, power,  normal throughout.no focal  deficits noted.   Assessment & Plan   Essential hypertension Controlled, no change in medication DASH diet and commitment to daily physical activity for a minimum of 30 minutes discussed and encouraged, as a part of hypertension management. The importance of attaining a healthy weight is also discussed.  BP/Weight 01/27/2016 09/25/2015 06/30/2015 06/13/2015 05/21/2015 01/27/2015 [id number]  Systolic BP [id number] 0000000 123XX123 A999333 Q000111Q 0000000 123456  Diastolic BP 80 64 84 61 82 82 74  Wt. (Lbs) 158 163 166.08 168 168 171.12 169.12  BMI 24.74 25.52 26.01 25.91 26.31 26.8 26.48        Type 2 diabetes mellitus Controlled, no change in medication Mikayla Jenkins is reminded of the importance of commitment to daily physical activity for 30 minutes or more, as able and the need to limit carbohydrate intake to 30 to 60 grams per meal to help with blood sugar control.   The need to take medication as prescribed, test blood sugar as directed, and to call between visits if there is a concern that blood sugar is uncontrolled is also discussed.   Mikayla Jenkins is reminded of the importance of daily foot exam, annual eye examination, and good blood sugar, blood pressure and cholesterol control.  Diabetic Labs Latest Ref Rng 01/22/2016 09/18/2015 05/15/2015 01/02/2015 08/15/2014  HbA1c <5.7 % 7.0(H) 7.3(H) 7.0(H) 6.6(H) 7.0(H)  Microalbumin Not estab mg/dL - 0.3 - - 0.7  Micro/Creat Ratio <30 mcg/mg creat - 2 - - 3.2  Chol 125 - 200 mg/dL - 185 - 186 -  HDL >=  46 mg/dL - 96 - 108 -  Calc LDL <130 mg/dL - 73 - 63 -  Triglycerides <150 mg/dL - 81 - 75 -  Creatinine 0.60 - 0.93 mg/dL 1.07(H) 0.98(H) 1.03(H) 1.02 1.06   BP/Weight 01/27/2016 09/25/2015 06/30/2015 06/13/2015 05/21/2015 01/27/2015 [id number]  Systolic BP [id number] 0000000 123XX123 A999333 Q000111Q 0000000 123456  Diastolic BP 80 64 84 61 82 82 74  Wt. (Lbs) 158 163 166.08 168 168 171.12 169.12  BMI 24.74 25.52 26.01 25.91 26.31 26.8 26.48   Foot/eye exam completion dates Latest Ref Rng 05/21/2015 11/12/2014    Eye Exam No Retinopathy - No Retinopathy  Foot Form Completion - Done -         Hyperlipidemia LDL goal <100 Hyperlipidemia:Low fat diet discussed and encouraged.  Updated lab needed at/ before next visit.    Lipid Panel  Lab Results  Component Value Date   CHOL 185 09/18/2015   HDL 96 09/18/2015   LDLCALC 73 09/18/2015   TRIG 81 09/18/2015   CHOLHDL 1.9 09/18/2015        Sciatica of right side No current or recent flare       Review of Systems     Objective:   Physical Exam        Assessment & Plan:

## 2016-01-29 NOTE — Assessment & Plan Note (Signed)
No current or recent flare

## 2016-01-29 NOTE — Assessment & Plan Note (Signed)
Hyperlipidemia:Low fat diet discussed and encouraged.  Updated lab needed at/ before next visit.    Lipid Panel  Lab Results  Component Value Date   CHOL 185 09/18/2015   HDL 96 09/18/2015   LDLCALC 73 09/18/2015   TRIG 81 09/18/2015   CHOLHDL 1.9 09/18/2015

## 2016-01-29 NOTE — Assessment & Plan Note (Signed)
Controlled, no change in medication Mikayla Jenkins is reminded of the importance of commitment to daily physical activity for 30 minutes or more, as able and the need to limit carbohydrate intake to 30 to 60 grams per meal to help with blood sugar control.   The need to take medication as prescribed, test blood sugar as directed, and to call between visits if there is a concern that blood sugar is uncontrolled is also discussed.   Mikayla Jenkins is reminded of the importance of daily foot exam, annual eye examination, and good blood sugar, blood pressure and cholesterol control.  Diabetic Labs Latest Ref Rng 01/22/2016 09/18/2015 05/15/2015 01/02/2015 08/15/2014  HbA1c <5.7 % 7.0(H) 7.3(H) 7.0(H) 6.6(H) 7.0(H)  Microalbumin Not estab mg/dL - 0.3 - - 0.7  Micro/Creat Ratio <30 mcg/mg creat - 2 - - 3.2  Chol 125 - 200 mg/dL - 185 - 186 -  HDL >=46 mg/dL - 96 - 108 -  Calc LDL <130 mg/dL - 73 - 63 -  Triglycerides <150 mg/dL - 81 - 75 -  Creatinine 0.60 - 0.93 mg/dL 1.07(H) 0.98(H) 1.03(H) 1.02 1.06   BP/Weight 01/27/2016 09/25/2015 06/30/2015 06/13/2015 05/21/2015 01/27/2015 [id number]  Systolic BP [id number] 0000000 123XX123 A999333 Q000111Q 0000000 123456  Diastolic BP 80 64 84 61 82 82 74  Wt. (Lbs) 158 163 166.08 168 168 171.12 169.12  BMI 24.74 25.52 26.01 25.91 26.31 26.8 26.48   Foot/eye exam completion dates Latest Ref Rng 05/21/2015 11/12/2014  Eye Exam No Retinopathy - No Retinopathy  Foot Form Completion - Done -

## 2016-01-29 NOTE — Assessment & Plan Note (Signed)
Controlled, no change in medication DASH diet and commitment to daily physical activity for a minimum of 30 minutes discussed and encouraged, as a part of hypertension management. The importance of attaining a healthy weight is also discussed.  BP/Weight 01/27/2016 09/25/2015 06/30/2015 06/13/2015 05/21/2015 01/27/2015 [id number]  Systolic BP [id number] 0000000 123XX123 A999333 Q000111Q 0000000 123456  Diastolic BP 80 64 84 61 82 82 74  Wt. (Lbs) 158 163 166.08 168 168 171.12 169.12  BMI 24.74 25.52 26.01 25.91 26.31 26.8 26.48

## 2016-02-11 ENCOUNTER — Other Ambulatory Visit: Payer: Self-pay | Admitting: Family Medicine

## 2016-03-23 ENCOUNTER — Other Ambulatory Visit: Payer: Self-pay | Admitting: Family Medicine

## 2016-04-02 LAB — HM DIABETES EYE EXAM

## 2016-04-22 ENCOUNTER — Other Ambulatory Visit: Payer: Self-pay | Admitting: Family Medicine

## 2016-05-22 DIAGNOSIS — E1169 Type 2 diabetes mellitus with other specified complication: Secondary | ICD-10-CM | POA: Diagnosis not present

## 2016-05-22 DIAGNOSIS — I10 Essential (primary) hypertension: Secondary | ICD-10-CM | POA: Diagnosis not present

## 2016-05-22 DIAGNOSIS — E785 Hyperlipidemia, unspecified: Secondary | ICD-10-CM | POA: Diagnosis not present

## 2016-05-22 LAB — CBC
HCT: 37.2 % (ref 35.0–45.0)
Hemoglobin: 12.5 g/dL (ref 11.7–15.5)
MCH: 31.2 pg (ref 27.0–33.0)
MCHC: 33.6 g/dL (ref 32.0–36.0)
MCV: 92.8 fL (ref 80.0–100.0)
MPV: 10 fL (ref 7.5–12.5)
PLATELETS: 231 10*3/uL (ref 140–400)
RBC: 4.01 MIL/uL (ref 3.80–5.10)
RDW: 12.9 % (ref 11.0–15.0)
WBC: 4.6 10*3/uL (ref 3.8–10.8)

## 2016-05-22 LAB — COMPREHENSIVE METABOLIC PANEL
ALBUMIN: 4.4 g/dL (ref 3.6–5.1)
ALK PHOS: 78 U/L (ref 33–130)
ALT: 14 U/L (ref 6–29)
AST: 19 U/L (ref 10–35)
BILIRUBIN TOTAL: 0.8 mg/dL (ref 0.2–1.2)
BUN: 16 mg/dL (ref 7–25)
CO2: 26 mmol/L (ref 20–31)
CREATININE: 1.09 mg/dL — AB (ref 0.60–0.93)
Calcium: 9.9 mg/dL (ref 8.6–10.4)
Chloride: 103 mmol/L (ref 98–110)
Glucose, Bld: 142 mg/dL — ABNORMAL HIGH (ref 65–99)
Potassium: 4 mmol/L (ref 3.5–5.3)
SODIUM: 138 mmol/L (ref 135–146)
TOTAL PROTEIN: 6.6 g/dL (ref 6.1–8.1)

## 2016-05-22 LAB — LIPID PANEL
Cholesterol: 202 mg/dL — ABNORMAL HIGH (ref 125–200)
HDL: 133 mg/dL (ref >=46)
LDL Cholesterol: 59 mg/dL (ref <130)
TRIGLYCERIDES: 50 mg/dL (ref <150)
Total CHOL/HDL Ratio: 1.5 Ratio (ref <=5.0)
VLDL: 10 mg/dL (ref <30)

## 2016-05-22 LAB — TSH: TSH: 1.74 mIU/L

## 2016-05-23 LAB — HEMOGLOBIN A1C
Hgb A1c MFr Bld: 6.7 % — ABNORMAL HIGH (ref <5.7)
Mean Plasma Glucose: 146 mg/dL

## 2016-05-31 NOTE — Progress Notes (Signed)
Preventive Screening-Counseling & Management   Patient present here today for a Medicare annual wellness visit.   Current Problems (verified)   Medications Prior to Visit Allergies (verified)   PAST HISTORY  Family History (updated)  Social History Retired from [employer], mother of 3 children, never smoker    Risk Factors  Current exercise habits:  Goes to the [employer] 4 days a week   Dietary issues discussed: heart healthy, eats lots of fruit but tries to limit fried fatty foods and carbs    Cardiac risk factors: HTN, diabetes  Depression Screen  (Note: if answer to either of the following is "Yes", a more complete depression screening is indicated)   Over the past two weeks, have you felt down, depressed or hopeless? No  Over the past two weeks, have you felt little interest or pleasure in doing things? No  Have you lost interest or pleasure in daily life? No  Do you often feel hopeless? No  Do you cry easily over simple problems? No   Activities of Daily Living  In your present state of health, do you have any difficulty performing the following activities?  Driving?: No Managing money?: No Feeding yourself?:No Getting from bed to chair?:No Climbing a flight of stairs?:No Preparing food and eating?:No Bathing or showering?:No Getting dressed?:No Getting to the toilet?:No Using the toilet?:No Moving around from place to place?: No  Fall Risk Assessment In the past year have you fallen or had a near fall?:No Are you currently taking any medications that make you dizzy?:No   Hearing Difficulties: No Do you often ask people to speak up or repeat themselves?:No Do you experience ringing or noises in your ears?:No Do you have difficulty understanding soft or whispered voices?:No  Cognitive Testing  Alert? Yes Normal Appearance?Yes  Oriented to person? Yes Place? Yes  Time? Yes  Displays appropriate judgment?Yes  Can read the correct time from a watch  face? yes Are you having problems remembering things?No  Advanced Directives have been discussed with the patient?Yes, brochure given in the past.  , full code   List the Names of Other Physician/Practitioners you currently use: (updated)   Indicate any recent Medical Services you may have received from other than [facility] providers in the past year (date may be approximate).     Medicare Attestation  I have personally reviewed:  The patient's medical and social history  Their use of alcohol, tobacco or illicit drugs  Their current medications and supplements  The patient's functional ability including ADLs,fall risks, home safety risks, cognitive, and hearing and visual impairment  Diet and physical activities  Evidence for depression or mood disorders  The patient's weight, height, BMI, and visual acuity have been recorded in the chart. I have made referrals, counseling, and provided education to the patient based on review of the above and I have provided the patient with a written personalized care plan for preventive services.    Physical Exam BP (!) 148/82   Pulse 66   Resp 16   Ht 5\' 7"  (1.702 m)   Wt 161 lb 1.9 oz (73.1 kg)   SpO2 98%   BMI 25.23 kg/m     Assessment & Plan:   Annual exam as documented. Counseling done  re healthy lifestyle involving commitment to 150 minutes exercise per week, heart healthy diet, and attaining healthy weight.The importance of adequate sleep also discussed. Regular seat belt use and home safety, is also discussed. Changes in health habits are decided on  by the patient with goals and time frames  set for achieving them. Immunization and cancer screening needs are specifically addressed at this visit.

## 2016-06-01 ENCOUNTER — Encounter: Payer: Self-pay | Admitting: Family Medicine

## 2016-06-01 ENCOUNTER — Ambulatory Visit (INDEPENDENT_AMBULATORY_CARE_PROVIDER_SITE_OTHER): Payer: Commercial Managed Care - HMO | Admitting: Family Medicine

## 2016-06-01 VITALS — BP 148/82 | HR 66 | Resp 16 | Ht 67.0 in | Wt 161.1 lb

## 2016-06-01 DIAGNOSIS — Z0000 Encounter for general adult medical examination without abnormal findings: Secondary | ICD-10-CM | POA: Diagnosis not present

## 2016-06-01 DIAGNOSIS — E1169 Type 2 diabetes mellitus with other specified complication: Secondary | ICD-10-CM

## 2016-06-01 MED ORDER — ATENOLOL-CHLORTHALIDONE 50-25 MG PO TABS: 1.0000 | ORAL_TABLET | Freq: Every day | ORAL | 1 refills | Status: DC

## 2016-06-01 MED ORDER — BENAZEPRIL HCL 40 MG PO TABS: 40.0000 mg | ORAL_TABLET | Freq: Every day | ORAL | 1 refills | Status: DC

## 2016-06-01 MED ORDER — CLONIDINE HCL 0.3 MG PO TABS: ORAL_TABLET | ORAL | 1 refills | Status: DC

## 2016-06-01 MED ORDER — LOVASTATIN 40 MG PO TABS: 40.0000 mg | ORAL_TABLET | Freq: Every day | ORAL | 1 refills | Status: DC

## 2016-06-01 NOTE — Patient Instructions (Addendum)
Annual physical in 3 month, call if you need me before  EXCELLENT lab report and wellness, keep on being responsible for your health, we  am here to help  Mammogram due in November  No med changes  Advance Directive Advance directives are the legal documents that allow you to make choices about your health care and medical treatment if you cannot speak for yourself. Advance directives are a way for you to communicate your wishes to family, friends, and health care providers. The specified people can then convey your decisions about end-of-life care to avoid confusion if you should become unable to communicate. Ideally, the process of discussing and writing advance directives should happen over time rather than making decisions all at once. Advance directives can be modified as your situation changes, and you can change your mind at any time, even after you have signed the advance directives. Each state has its own laws regarding advance directives. You may want to check with your health care provider, attorney, or state representative about the law in your state. Below are some examples of advance directives. LIVING WILL A living will is a set of instructions documenting your wishes about medical care when you cannot care for yourself. It is used if you become:  Terminally ill.  Incapacitated.  Unable to communicate.  Unable to make decisions. Items to consider in your living will include:  The use or non-use of life-sustaining equipment, such as dialysis machines and breathing machines (ventilators).  A do not resuscitate (DNR) order, which is the instruction not to use cardiopulmonary resuscitation (CPR) if breathing or heartbeat stops.  Tube feeding.  Withholding of food and fluids.  Comfort (palliative) care when the goal becomes comfort rather than a cure.  Organ and tissue donation. A living will does not give instructions about distribution of your money and property if you  should pass away. It is advisable to seek the expert advice of a lawyer in drawing up a will regarding your possessions. Decisions about taxes, beneficiaries, and asset distribution will be legally binding. This process can relieve your family and friends of any burdens surrounding disputes or questions that may come up about the allocation of your assets. DO NOT RESUSCITATE (DNR) A do not resuscitate (DNR) order is a request to not have CPR in the event that your heart stops beating or you stop breathing. Unless given other instructions, a health care provider will try to help any patient whose heart has stopped or who has stopped breathing.  HEALTH CARE PROXY AND DURABLE POWER OF ATTORNEY FOR HEALTH CARE A health care proxy is a person (agent) appointed to make medical decisions for you if you cannot. Generally, people choose someone they know well and trust to represent their preferences when they can no longer do so. You should be sure to ask this person for agreement to act as your agent. An agent may have to exercise judgment in the event of a medical decision for which your wishes are not known. The durable power of attorney for health care is the legal document that names your health care proxy. Once written, it should be:  Signed.  Notarized.  Dated.  Copied.  Witnessed.  Incorporated into your medical record. You may also want to appoint someone to manage your financial affairs if you cannot. This is called a durable power of attorney for finances. It is a separate legal document from the durable power of attorney for health care. You may choose the same  person or someone different from your health care proxy to act as your agent in financial matters.   This information is not intended to replace advice given to you by your health care provider. Make sure you discuss any questions you have with your health care provider.   Document Released: 01/11/2008 Document Revised: 10/09/2013  Document Reviewed: 02/21/2013 Elsevier Interactive Patient Education [employer].

## 2016-06-01 NOTE — Assessment & Plan Note (Signed)

## 2016-07-12 ENCOUNTER — Ambulatory Visit (INDEPENDENT_AMBULATORY_CARE_PROVIDER_SITE_OTHER): Payer: Commercial Managed Care - HMO

## 2016-07-12 DIAGNOSIS — Z23 Encounter for immunization: Secondary | ICD-10-CM

## 2016-08-21 DIAGNOSIS — E1169 Type 2 diabetes mellitus with other specified complication: Secondary | ICD-10-CM | POA: Diagnosis not present

## 2016-08-21 LAB — BASIC METABOLIC PANEL WITH GFR
BUN: 14 mg/dL (ref 7–25)
CHLORIDE: 104 mmol/L (ref 98–110)
CO2: 28 mmol/L (ref 20–31)
Calcium: 9.6 mg/dL (ref 8.6–10.4)
Creat: 1.08 mg/dL — ABNORMAL HIGH (ref 0.60–0.93)
GFR, EST AFRICAN AMERICAN: 60 mL/min (ref >=60)
GFR, Est Non African American: 52 mL/min — ABNORMAL LOW (ref >=60)
GLUCOSE: 141 mg/dL — AB (ref 65–99)
POTASSIUM: 3.9 mmol/L (ref 3.5–5.3)
Sodium: 139 mmol/L (ref 135–146)

## 2016-08-22 LAB — HEMOGLOBIN A1C
Hgb A1c MFr Bld: 6.5 % — ABNORMAL HIGH (ref <5.7)
Mean Plasma Glucose: 140 mg/dL

## 2016-08-26 ENCOUNTER — Telehealth: Payer: Self-pay | Admitting: Family Medicine

## 2016-08-26 ENCOUNTER — Encounter: Payer: Self-pay | Admitting: Family Medicine

## 2016-08-26 ENCOUNTER — Ambulatory Visit (INDEPENDENT_AMBULATORY_CARE_PROVIDER_SITE_OTHER): Payer: Commercial Managed Care - HMO | Admitting: Family Medicine

## 2016-08-26 VITALS — BP 144/84 | HR 65 | Resp 16 | Ht 67.0 in | Wt 159.0 lb

## 2016-08-26 DIAGNOSIS — E1140 Type 2 diabetes mellitus with diabetic neuropathy, unspecified: Secondary | ICD-10-CM

## 2016-08-26 DIAGNOSIS — Z1211 Encounter for screening for malignant neoplasm of colon: Secondary | ICD-10-CM

## 2016-08-26 DIAGNOSIS — E119 Type 2 diabetes mellitus without complications: Secondary | ICD-10-CM | POA: Diagnosis not present

## 2016-08-26 DIAGNOSIS — Z0000 Encounter for general adult medical examination without abnormal findings: Secondary | ICD-10-CM

## 2016-08-26 DIAGNOSIS — I10 Essential (primary) hypertension: Secondary | ICD-10-CM

## 2016-08-26 DIAGNOSIS — E785 Hyperlipidemia, unspecified: Secondary | ICD-10-CM

## 2016-08-26 LAB — POC HEMOCCULT BLD/STL (OFFICE/1-CARD/DIAGNOSTIC): Fecal Occult Blood, POC: NEGATIVE

## 2016-08-26 MED ORDER — AMLODIPINE BESYLATE 10 MG PO TABS: 10.0000 mg | ORAL_TABLET | Freq: Every day | ORAL | 1 refills | Status: DC

## 2016-08-26 MED ORDER — METFORMIN HCL 850 MG PO TABS: ORAL_TABLET | ORAL | 1 refills | Status: DC

## 2016-08-26 NOTE — Telephone Encounter (Signed)
Patient is asking about medication meloxican, is she still taking it

## 2016-08-26 NOTE — Telephone Encounter (Signed)
Addressed at ov

## 2016-08-26 NOTE — Progress Notes (Signed)
Mikayla Jenkins     MRN: [id number]      DOB: 1945-11-17  HPI: Patient is in for annual physical exam. No other health concerns are expressed or addressed at the visit. Recent labs,  are reviewed. Immunization is reviewed , and  updated if needed.   PE: Pleasant  female, alert and oriented x 3, in no cardio-pulmonary distress. Afebrile. HEENT No facial trauma or asymetry. Sinuses non tender.  Extra occullar muscles intact,External ears normal, tympanic membranes clear. Oropharynx moist, no exudate. Neck: supple, no adenopathy,JVD or thyromegaly.No bruits.  Chest: Clear to ascultation bilaterally.No crackles or wheezes. Non tender to palpation  Breast: No asymetry,no masses or lumps. No tenderness. No nipple discharge or inversion. No axillary or supraclavicular adenopathy  Cardiovascular system; Heart sounds normal,  S1 and  S2 ,no S3.  No murmur, or thrill. Apical beat not displaced Peripheral pulses normal.  Abdomen: Soft, non tender, no organomegaly or masses. No bruits. Bowel sounds normal. No guarding, tenderness or rebound.  Rectal:  Normal sphincter tone. No rectal mass. Guaiac negative stool.  GU: External genitalia normal female genitalia , normal female distribution of hair. No lesions. Urethral meatus normal in size, no  Prolapse, no lesions visibly  Present. Bladder non tender. Vagina pink and moist , with no visible lesions , discharge present . Adequate pelvic support no  cystocele or rectocele noted Cervix pink and appears healthy, no lesions or ulcerations noted, no discharge noted from os Uterus normal size, no adnexal masses, no cervical motion or adnexal tenderness.   Musculoskeletal exam: Full ROM of spine, hips , shoulders and knees. No deformity ,swelling or crepitus noted. No muscle wasting or atrophy.   Neurologic: Cranial nerves 2 to 12 intact. Power, tone ,sensation and reflexes normal throughout. No disturbance in gait. No  tremor.  Skin: Intact, no ulceration, erythema , scaling or rash noted. Pigmentation normal throughout  Psych; Normal mood and affect. Judgement and concentration normal   Assessment & Plan:  Annual physical exam Annual exam as documented. Counseling done  re healthy lifestyle involving commitment to 150 minutes exercise per week, heart healthy diet, and attaining healthy weight.The importance of adequate sleep also discussed. Regular seat belt use and home safety, is also discussed. Changes in health habits are decided on by the patient with goals and time frames  set for achieving them. Immunization and cancer screening needs are specifically addressed at this visit.   Type 2 diabetes mellitus Controlled, no change in medication Mikayla Jenkins is reminded of the importance of commitment to daily physical activity for 30 minutes or more, as able and the need to limit carbohydrate intake to 30 to 60 grams per meal to help with blood sugar control.   The need to take medication as prescribed, test blood sugar as directed, and to call between visits if there is a concern that blood sugar is uncontrolled is also discussed.   Mikayla Jenkins is reminded of the importance of daily foot exam, annual eye examination, and good blood sugar, blood pressure and cholesterol control.  Diabetic Labs Latest Ref Rng & Units 08/21/2016 05/22/2016 01/22/2016 09/18/2015 05/15/2015  HbA1c <5.7 % 6.5(H) 6.7(H) 7.0(H) 7.3(H) 7.0(H)  Microalbumin Not estab mg/dL - - - 0.3 -  Micro/Creat Ratio <30 mcg/mg creat - - - 2 -  Chol 125 - 200 mg/dL - 202(H) - 185 -  HDL >=46 mg/dL - 133 - 96 -  Calc LDL <130 mg/dL - 59 - 73 -  Triglycerides <150 mg/dL - 50 - 81 -  Creatinine 0.60 - 0.93 mg/dL 1.08(H) 1.09(H) 1.07(H) 0.98(H) 1.03(H)   BP/Weight 08/26/2016 06/01/2016 01/27/2016 09/25/2015 06/30/2015 6/72/8979 10/23/411  Systolic BP 643 837 793 968 864 847 207  Diastolic BP 84 82 80 64 84 61 82  Wt. (Lbs) 159 161.12 158 163 166.08  168 168  BMI 24.9 25.23 24.74 25.52 26.01 25.91 26.31   Foot/eye exam completion dates Latest Ref Rng & Units 08/26/2016 04/02/2016  Eye Exam No Retinopathy - No Retinopathy  Foot Form Completion - Done -

## 2016-08-26 NOTE — Assessment & Plan Note (Signed)
Controlled, no change in medication Mikayla Jenkins is reminded of the importance of commitment to daily physical activity for 30 minutes or more, as able and the need to limit carbohydrate intake to 30 to 60 grams per meal to help with blood sugar control.   The need to take medication as prescribed, test blood sugar as directed, and to call between visits if there is a concern that blood sugar is uncontrolled is also discussed.   Mikayla Jenkins is reminded of the importance of daily foot exam, annual eye examination, and good blood sugar, blood pressure and cholesterol control.  Diabetic Labs Latest Ref Rng & Units 08/21/2016 05/22/2016 01/22/2016 09/18/2015 05/15/2015  HbA1c <5.7 % 6.5(H) 6.7(H) 7.0(H) 7.3(H) 7.0(H)  Microalbumin Not estab mg/dL - - - 0.3 -  Micro/Creat Ratio <30 mcg/mg creat - - - 2 -  Chol 125 - 200 mg/dL - 202(H) - 185 -  HDL >=46 mg/dL - 133 - 96 -  Calc LDL <130 mg/dL - 59 - 73 -  Triglycerides <150 mg/dL - 50 - 81 -  Creatinine 0.60 - 0.93 mg/dL 1.08(H) 1.09(H) 1.07(H) 0.98(H) 1.03(H)   BP/Weight 08/26/2016 06/01/2016 01/27/2016 09/25/2015 06/30/2015 3/38/2505 12/25/7671  Systolic BP 419 379 024 097 353 299 242  Diastolic BP 84 82 80 64 84 61 82  Wt. (Lbs) 159 161.12 158 163 166.08 168 168  BMI 24.9 25.23 24.74 25.52 26.01 25.91 26.31   Foot/eye exam completion dates Latest Ref Rng & Units 08/26/2016 04/02/2016  Eye Exam No Retinopathy - No Retinopathy  Foot Form Completion - Done -

## 2016-08-26 NOTE — Addendum Note (Signed)
Addended by: Eual Fines on: 08/26/2016 01:00 PM   Modules accepted: Orders

## 2016-08-26 NOTE — Assessment & Plan Note (Signed)

## 2016-08-26 NOTE — Patient Instructions (Addendum)
F/u in 4 months, excellent labs and exam  Pls schedule mammogram  No changes in medication  Five day course of prednisone , low dose is sent to your pharmacy for use if needed for pain of inflammation   Fasting lipid, hBA1c, cmp and eGFr and microalb in 4 months  Thank you  for choosing  Primary Care. We consider it a privelige to serve you.  Delivering excellent health care in a caring and  compassionate way is our goal.  Partnering with you,  so that together we can achieve this goal is our strategy.

## 2016-08-27 ENCOUNTER — Other Ambulatory Visit: Payer: Self-pay | Admitting: Family Medicine

## 2016-08-27 ENCOUNTER — Encounter: Payer: Commercial Managed Care - HMO | Admitting: Family Medicine

## 2016-08-27 DIAGNOSIS — Z1231 Encounter for screening mammogram for malignant neoplasm of breast: Secondary | ICD-10-CM

## 2016-09-08 ENCOUNTER — Other Ambulatory Visit: Payer: Self-pay | Admitting: Family Medicine

## 2016-09-16 ENCOUNTER — Ambulatory Visit (HOSPITAL_COMMUNITY)
Admission: RE | Admit: 2016-09-16 | Discharge: 2016-09-16 | Disposition: A | Discharge status: Home / Self Care | Payer: Commercial Managed Care - HMO | Source: Ambulatory Visit | Attending: Family Medicine | Admitting: Family Medicine

## 2016-09-16 DIAGNOSIS — Z1231 Encounter for screening mammogram for malignant neoplasm of breast: Secondary | ICD-10-CM | POA: Diagnosis not present

## 2016-09-27 ENCOUNTER — Encounter: Payer: Self-pay | Admitting: Family Medicine

## 2016-09-27 LAB — HM DIABETES EYE EXAM

## 2016-10-07 ENCOUNTER — Telehealth: Payer: Self-pay | Admitting: Family Medicine

## 2016-10-07 ENCOUNTER — Other Ambulatory Visit: Payer: Self-pay | Admitting: Family Medicine

## 2016-10-07 MED ORDER — PREDNISONE 5 MG PO TABS: 5.0000 mg | ORAL_TABLET | Freq: Two times a day (BID) | ORAL | 0 refills | Status: DC

## 2016-10-07 MED ORDER — PREDNISONE 5 MG PO TABS: 5.0000 mg | ORAL_TABLET | Freq: Two times a day (BID) | ORAL | 0 refills | Status: AC

## 2016-10-07 NOTE — Telephone Encounter (Signed)
Please advise 

## 2016-10-07 NOTE — Telephone Encounter (Signed)
Patient aware.

## 2016-10-07 NOTE — Telephone Encounter (Signed)
Med re sent, did not see it was sent today, pls let her know

## 2016-10-07 NOTE — Telephone Encounter (Signed)
Mikayla Jenkins is now asking for the Rx of the Prednisone for 5 days that Dr. Moshe Cipro wanted to prescribe before , please advise?

## 2016-12-14 ENCOUNTER — Other Ambulatory Visit: Payer: Self-pay | Admitting: Family Medicine

## 2016-12-23 ENCOUNTER — Other Ambulatory Visit: Payer: Self-pay | Admitting: *Deleted

## 2016-12-23 ENCOUNTER — Other Ambulatory Visit: Payer: Self-pay | Admitting: Family Medicine

## 2016-12-23 DIAGNOSIS — I10 Essential (primary) hypertension: Secondary | ICD-10-CM

## 2016-12-23 DIAGNOSIS — E785 Hyperlipidemia, unspecified: Secondary | ICD-10-CM

## 2016-12-23 DIAGNOSIS — E1140 Type 2 diabetes mellitus with diabetic neuropathy, unspecified: Secondary | ICD-10-CM

## 2016-12-23 LAB — BASIC METABOLIC PANEL
BUN: 16 mg/dL (ref 7–25)
CHLORIDE: 102 mmol/L (ref 98–110)
CO2: 28 mmol/L (ref 20–31)
CREATININE: 0.94 mg/dL — AB (ref 0.60–0.93)
Calcium: 9.7 mg/dL (ref 8.6–10.4)
Glucose, Bld: 130 mg/dL — ABNORMAL HIGH (ref 65–99)
Potassium: 4 mmol/L (ref 3.5–5.3)
Sodium: 140 mmol/L (ref 135–146)

## 2016-12-23 LAB — HEMOGLOBIN A1C
Hgb A1c MFr Bld: 6.6 % — ABNORMAL HIGH (ref <5.7)
Mean Plasma Glucose: 143 mg/dL

## 2016-12-23 LAB — LIPID PANEL
CHOLESTEROL: 183 mg/dL (ref <200)
HDL: 111 mg/dL (ref >50)
LDL Cholesterol: 59 mg/dL (ref <100)
TRIGLYCERIDES: 66 mg/dL (ref <150)
Total CHOL/HDL Ratio: 1.6 Ratio (ref <5.0)
VLDL: 13 mg/dL (ref <30)

## 2016-12-23 NOTE — Addendum Note (Signed)
Addended by: Eual Fines on: 12/23/2016 11:41 AM   Modules accepted: Orders

## 2016-12-27 ENCOUNTER — Ambulatory Visit (INDEPENDENT_AMBULATORY_CARE_PROVIDER_SITE_OTHER): Payer: Commercial Managed Care - HMO | Admitting: Family Medicine

## 2016-12-27 ENCOUNTER — Encounter: Payer: Self-pay | Admitting: Family Medicine

## 2016-12-27 VITALS — BP 142/82 | HR 61 | Resp 16 | Ht 67.0 in | Wt 156.0 lb

## 2016-12-27 DIAGNOSIS — E785 Hyperlipidemia, unspecified: Secondary | ICD-10-CM

## 2016-12-27 DIAGNOSIS — N3001 Acute cystitis with hematuria: Secondary | ICD-10-CM | POA: Diagnosis not present

## 2016-12-27 DIAGNOSIS — N8110 Cystocele, unspecified: Secondary | ICD-10-CM | POA: Diagnosis not present

## 2016-12-27 DIAGNOSIS — N3091 Cystitis, unspecified with hematuria: Secondary | ICD-10-CM

## 2016-12-27 DIAGNOSIS — E118 Type 2 diabetes mellitus with unspecified complications: Secondary | ICD-10-CM

## 2016-12-27 DIAGNOSIS — M5431 Sciatica, right side: Secondary | ICD-10-CM | POA: Diagnosis not present

## 2016-12-27 DIAGNOSIS — I10 Essential (primary) hypertension: Secondary | ICD-10-CM

## 2016-12-27 LAB — HEPATIC FUNCTION PANEL
ALT: 11 U/L (ref 6–29)
AST: 16 U/L (ref 10–35)
Albumin: 4.3 g/dL (ref 3.6–5.1)
Alkaline Phosphatase: 76 U/L (ref 33–130)
BILIRUBIN DIRECT: 0.1 mg/dL (ref <=0.2)
BILIRUBIN INDIRECT: 0.4 mg/dL (ref 0.2–1.2)
TOTAL PROTEIN: 6.6 g/dL (ref 6.1–8.1)
Total Bilirubin: 0.5 mg/dL (ref 0.2–1.2)

## 2016-12-27 LAB — POCT URINALYSIS DIPSTICK
BILIRUBIN UA: NEGATIVE
GLUCOSE UA: NEGATIVE
KETONES UA: NEGATIVE
Nitrite, UA: NEGATIVE
Protein, UA: NEGATIVE
Spec Grav, UA: 1.015
Urobilinogen, UA: 0.2
pH, UA: 6.5

## 2016-12-27 MED ORDER — CIPROFLOXACIN HCL 500 MG PO TABS: 500.0000 mg | ORAL_TABLET | Freq: Two times a day (BID) | ORAL | 0 refills | Status: DC

## 2016-12-27 MED ORDER — PREDNISONE 5 MG (21) PO TBPK: 5.0000 mg | ORAL_TABLET | ORAL | 0 refills | Status: DC

## 2016-12-27 MED ORDER — HYDROCODONE-ACETAMINOPHEN 5-325 MG PO TABS: ORAL_TABLET | ORAL | 0 refills | Status: DC

## 2016-12-27 NOTE — Patient Instructions (Addendum)
Wellness visit with Mikayla Jenkins in 4 month, physical exam with MD Nov 10 . Call if you need me before  CONGRATS on excellent labs  For arthritis pain, take prednsione 5 mg one wice daily for next 3 days.( dose pack is prescribed but do not take like that  Daily for arthritis, massage / rub with effective cream, ( biofreeze is good), also good to take tylenol 500 mg one to two daily and absolutely continue regular exercise   Fasting lipid, cmp and EGFR, hBA1c, cBC and TSH first week in September  Three day antibiotic course prescribed as you appear to have a UTI, we will call later in the week if needed  Please work on good  health habits so that your health will improve. 1. Commitment to daily physical activity for 30 to 60  minutes, if you are able to do this.  2. Commitment to wise food choices. Aim for half of your  food intake to be vegetable and fruit, one quarter starchy foods, and one quarter protein. Try to eat on a regular schedule  3 meals per day, snacking between meals should be limited to vegetables or fruits or small portions of nuts. 64 ounces of water per day is generally recommended, unless you have specific health conditions, like heart failure or kidney failure where you will need to limit fluid intake.  3. Commitment to sufficient and a  good quality of physical and mental rest daily, generally between 6 to 8 hours per day.  WITH PERSISTANCE AND PERSEVERANCE, THE IMPOSSIBLE , BECOMES THE NORM!   Thank you  for choosing [facility]. We consider it a privelige to serve you.  Delivering excellent health care in a caring and  compassionate way is our goal.  Partnering with you,  so that together we can achieve this goal is our strategy.

## 2016-12-27 NOTE — Assessment & Plan Note (Signed)
Hyperlipidemia:Low fat diet discussed and encouraged.   Lipid Panel  Lab Results  Component Value Date   CHOL 183 12/23/2016   HDL 111 12/23/2016   LDLCALC 59 12/23/2016   TRIG 66 12/23/2016   CHOLHDL 1.6 12/23/2016    '. Controlled, no change in medication

## 2016-12-27 NOTE — Assessment & Plan Note (Signed)
Increased symptoms x 1 month, prednisone burst prescribed , advised regular topical rubs and tylenol, also hydrocodone script provided, thirty tabs for 12 months

## 2016-12-27 NOTE — Progress Notes (Signed)
GENIFER LAZENBY     MRN: [id number]      DOB: [birth year]   HPI Ms. Wingler is here for follow up and re-evaluation of chronic medical conditions, medication management and review of any available recent lab and radiology data.  Preventive health is updated, specifically  Cancer screening and Immunization.   Questions or concerns regarding consultations or procedures which the PT has had in the interim are  addressed. The PT denies any adverse reactions to current medications since the last visit.  C/o intermittent rigth sciatic pain and is requesting hydrocodone script for 1 year per routine, she actually uses at most 30 tablets. No recent trauma, thinks it is weather related  3 week h/o malodorous urine with frequency, denies flank pain.Has to wear incontinence pads and overnight diaper in past 1 month C/o discomfort and pressure with straining at stool in vaginal area Denies polyuria, polydipsia, blurred vision , or hypoglycemic episodes.  ROS Denies recent fever or chills. Denies sinus pressure, nasal congestion, ear pain or sore throat. Denies chest congestion, productive cough or wheezing. Denies chest pains, palpitations and leg swelling Denies abdominal pain, nausea, vomiting,diarrhea or constipation.   e. Denies joint pain, swelling and limitation in mobility. Denies headaches, seizures, numbness, or tingling. Denies depression, anxiety or insomnia. Denies skin break down or rash.   PE  BP (!) 142/82   Pulse 61   Resp 16   Ht 5\' 7"  (1.702 m)   Wt 156 lb (70.8 kg)   SpO2 100%   BMI 24.43 kg/m   Patient alert and oriented and in no cardiopulmonary distress.  HEENT: No facial asymmetry, EOMI,   oropharynx pink and moist.  Neck supple no JVD, no mass.  Chest: Clear to auscultation bilaterally.  CVS: S1, S2 no murmurs, no S3.Regular rate.  ABD: Soft non tender.   Ext: No edema  MS: decreased  ROM lumbar  Spine,adequate in houlders, hips and knees.  Skin: Intact,  no ulcerations or rash noted.  Psych: Good eye contact, normal affect. Memory intact not anxious or depressed appearing.  CNS: CN 2-12 intact, power,  normal throughout.no focal deficits noted.   Assessment & Plan  Essential hypertension Sub optimal though adequate control, no med change DASH diet and commitment to daily physical activity for a minimum of 30 minutes discussed and encouraged, as a part of hypertension management. The importance of attaining a healthy weight is also discussed.  BP/Weight 12/27/2016 08/26/2016 06/01/2016 01/27/2016 09/25/2015 06/30/2015 1/69/6789  Systolic BP 381 017 510 258 527 782 423  Diastolic BP 82 84 82 80 64 84 61  Wt. (Lbs) 156 159 161.12 158 163 166.08 168  BMI 24.43 24.9 25.23 24.74 25.52 26.01 25.91       Type 2 diabetes mellitus Controlled, no change in medication Ms. Acord is reminded of the importance of commitment to daily physical activity for 30 minutes or more, as able and the need to limit carbohydrate intake to 30 to 60 grams per meal to help with blood sugar control.   The need to take medication as prescribed, test blood sugar as directed, and to call between visits if there is a concern that blood sugar is uncontrolled is also discussed.   Ms. Balash is reminded of the importance of daily foot exam, annual eye examination, and good blood sugar, blood pressure and cholesterol control.  Diabetic Labs Latest Ref Rng & Units 12/23/2016 08/21/2016 05/22/2016 01/22/2016 09/18/2015  HbA1c <5.7 % 6.6(H) 6.5(H) 6.7(H) 7.0(H) 7.3(H)  Microalbumin Not estab mg/dL - - - - 0.3  Micro/Creat Ratio <30 mcg/mg creat - - - - 2  Chol <200 mg/dL 183 - 202(H) - 185  HDL >50 mg/dL 111 - 133 - 96  Calc LDL <100 mg/dL 59 - 59 - 73  Triglycerides <150 mg/dL 66 - 50 - 81  Creatinine 0.60 - 0.93 mg/dL 0.94(H) 1.08(H) 1.09(H) 1.07(H) 0.98(H)   BP/Weight 12/27/2016 08/26/2016 06/01/2016 01/27/2016 09/25/2015 06/30/2015 03/06/8021  Systolic BP 336 122 449 753 158 005 110    Diastolic BP 82 84 82 80 64 84 61  Wt. (Lbs) 156 159 161.12 158 163 166.08 168  BMI 24.43 24.9 25.23 24.74 25.52 26.01 25.91   Foot/eye exam completion dates Latest Ref Rng & Units 08/26/2016 04/02/2016  Eye Exam No Retinopathy - No Retinopathy  Foot Form Completion - Done -        Sciatica of right side Increased symptoms x 1 month, prednisone burst prescribed , advised regular topical rubs and tylenol, also hydrocodone script provided, thirty tabs for 12 months  Hyperlipidemia LDL goal <100 Hyperlipidemia:Low fat diet discussed and encouraged.   Lipid Panel  Lab Results  Component Value Date   CHOL 183 12/23/2016   HDL 111 12/23/2016   LDLCALC 59 12/23/2016   TRIG 66 12/23/2016   CHOLHDL 1.6 12/23/2016    '. Controlled, no change in medication   Female bladder prolapse Increased symptoms noted, no interest in intervention currently, will call back if needed  Cystitis with hematuria Symptomatic with abn UA, Cipro and c/s

## 2016-12-27 NOTE — Assessment & Plan Note (Signed)
Increased symptoms noted, no interest in intervention currently, will call back if needed

## 2016-12-27 NOTE — Assessment & Plan Note (Signed)
Symptomatic with abn UA, Cipro and c/s

## 2016-12-27 NOTE — Assessment & Plan Note (Signed)
Controlled, no change in medication Mikayla Jenkins is reminded of the importance of commitment to daily physical activity for 30 minutes or more, as able and the need to limit carbohydrate intake to 30 to 60 grams per meal to help with blood sugar control.   The need to take medication as prescribed, test blood sugar as directed, and to call between visits if there is a concern that blood sugar is uncontrolled is also discussed.   Mikayla Jenkins is reminded of the importance of daily foot exam, annual eye examination, and good blood sugar, blood pressure and cholesterol control.  Diabetic Labs Latest Ref Rng & Units 12/23/2016 08/21/2016 05/22/2016 01/22/2016 09/18/2015  HbA1c <5.7 % 6.6(H) 6.5(H) 6.7(H) 7.0(H) 7.3(H)  Microalbumin Not estab mg/dL - - - - 0.3  Micro/Creat Ratio <30 mcg/mg creat - - - - 2  Chol <200 mg/dL 183 - 202(H) - 185  HDL >50 mg/dL 111 - 133 - 96  Calc LDL <100 mg/dL 59 - 59 - 73  Triglycerides <150 mg/dL 66 - 50 - 81  Creatinine 0.60 - 0.93 mg/dL 0.94(H) 1.08(H) 1.09(H) 1.07(H) 0.98(H)   BP/Weight 12/27/2016 08/26/2016 06/01/2016 01/27/2016 09/25/2015 06/30/2015 0/86/7619  Systolic BP 509 326 712 458 099 833 825  Diastolic BP 82 84 82 80 64 84 61  Wt. (Lbs) 156 159 161.12 158 163 166.08 168  BMI 24.43 24.9 25.23 24.74 25.52 26.01 25.91   Foot/eye exam completion dates Latest Ref Rng & Units 08/26/2016 04/02/2016  Eye Exam No Retinopathy - No Retinopathy  Foot Form Completion - Done -

## 2016-12-27 NOTE — Assessment & Plan Note (Signed)
Sub optimal though adequate control, no med change DASH diet and commitment to daily physical activity for a minimum of 30 minutes discussed and encouraged, as a part of hypertension management. The importance of attaining a healthy weight is also discussed.  BP/Weight 12/27/2016 08/26/2016 06/01/2016 01/27/2016 09/25/2015 06/30/2015 7/84/7841  Systolic BP 282 081 388 719 597 471 855  Diastolic BP 82 84 82 80 64 84 61  Wt. (Lbs) 156 159 161.12 158 163 166.08 168  BMI 24.43 24.9 25.23 24.74 25.52 26.01 25.91

## 2016-12-28 ENCOUNTER — Other Ambulatory Visit (HOSPITAL_COMMUNITY)
Admission: RE | Admit: 2016-12-28 | Discharge: 2016-12-28 | Disposition: A | Discharge status: Home / Self Care | Payer: Medicare HMO | Source: Other Acute Inpatient Hospital | Attending: Family Medicine | Admitting: Family Medicine

## 2016-12-28 DIAGNOSIS — N3001 Acute cystitis with hematuria: Secondary | ICD-10-CM | POA: Insufficient documentation

## 2016-12-31 LAB — URINE CULTURE: Culture: >=100000 — AB

## 2017-01-25 ENCOUNTER — Other Ambulatory Visit: Payer: Self-pay | Admitting: Family Medicine

## 2017-02-23 ENCOUNTER — Other Ambulatory Visit: Payer: Self-pay | Admitting: Family Medicine

## 2017-03-08 ENCOUNTER — Other Ambulatory Visit: Payer: Self-pay | Admitting: Family Medicine

## 2017-04-11 ENCOUNTER — Telehealth: Payer: Self-pay | Admitting: Family Medicine

## 2017-04-11 DIAGNOSIS — I10 Essential (primary) hypertension: Secondary | ICD-10-CM

## 2017-04-11 DIAGNOSIS — E785 Hyperlipidemia, unspecified: Secondary | ICD-10-CM

## 2017-04-11 DIAGNOSIS — E118 Type 2 diabetes mellitus with unspecified complications: Secondary | ICD-10-CM

## 2017-05-02 ENCOUNTER — Ambulatory Visit (INDEPENDENT_AMBULATORY_CARE_PROVIDER_SITE_OTHER): Payer: Medicare HMO

## 2017-05-02 VITALS — BP 148/86 | HR 60 | Temp 98.4°F | Ht 67.0 in | Wt 151.0 lb

## 2017-05-02 DIAGNOSIS — Z0000 Encounter for general adult medical examination without abnormal findings: Secondary | ICD-10-CM

## 2017-05-02 NOTE — Progress Notes (Signed)
Subjective:   Mikayla Jenkins is a 71 y.o. female who presents for Medicare Annual (Subsequent) preventive examination.  Review of Systems:  Cardiac Risk Factors include: advanced age (>53men, >66 women);diabetes mellitus;hypertension;dyslipidemia     Objective:     Vitals: BP (!) 148/86   Pulse 60   Temp 98.4 F (36.9 C) (Oral)   Ht 5\' 7"  (1.702 m)   Wt 151 lb 0.6 oz (68.5 kg)   BMI 23.66 kg/m   Body mass index is 23.66 kg/m.   Tobacco History  Smoking Status  . Never Smoker  Smokeless Tobacco  . Never Used     Counseling given: Not Answered   Past Medical History:  Diagnosis Date  . Diabetes mellitus type II   . Hyperlipidemia   . Hypertension    Past Surgical History:  Procedure Laterality Date  . ABDOMINAL HYSTERECTOMY    . COLONOSCOPY N/A 06/13/2015   Procedure: COLONOSCOPY;  Surgeon: Rogene Houston, MD;  Location: [facility];  Service: Endoscopy;  Laterality: N/A;  930-rescheduled 8/26 @ 8:30am Ann to notify pt  . HERNIA REPAIR    . inguinal herniorrhapy right    . right eye surgery secondary to right eye weakness    . SALPINGOOPHORECTOMY    . TUBAL LIGATION     Family History  Problem Relation Age of Onset  . Pneumonia Brother        on continuous O2  . Stroke Brother   . Stroke Sister   . COPD Sister   . Diabetes Sister   . Stroke Sister   . Hypertension Mother   . Diabetes Mother   . Fibroids Daughter        uterine   History  Sexual Activity  . Sexual activity: Yes  . Birth control/ protection: Surgical    Outpatient Encounter Prescriptions as of 05/02/2017  Medication Sig  . amLODipine (NORVASC) 10 MG tablet TAKE 1 TABLET EVERY DAY  . aspirin (ASPIRIN LOW DOSE) 81 MG EC tablet Take 81 mg by mouth daily. Take 1 tablet by mouth once a day   . atenolol-chlorthalidone (TENORETIC) 50-25 MG tablet TAKE 1 TABLET EVERY DAY  . benazepril (LOTENSIN) 40 MG tablet TAKE 1 TABLET EVERY DAY  . Calcium Carbonate-Vitamin D (CALCIUM 600/VITAMIN D)  600-400 MG-UNIT per tablet Take 1 tablet by mouth daily.  . cloNIDine (CATAPRES) 0.3 MG tablet TAKE 1 AND 1/2 TABLETS AT BEDTIME  . HYDROcodone-acetaminophen (NORCO/VICODIN) 5-325 MG tablet One tablet at bedtime as needed, for excessive pain.  Thirty tablets to last for 12 months  . lovastatin (MEVACOR) 40 MG tablet TAKE 1 TABLET EVERY DAY  . metFORMIN (GLUCOPHAGE) 850 MG tablet TAKE 1 TABLET EVERY DAY WITH BREAKFAST  . Multiple Vitamin (MULTIVITAMIN) tablet Take 1 tablet by mouth daily.  . TRUE METRIX BLOOD GLUCOSE TEST test strip TEST BLOOD SUGAR ONE TIME DAILY  . [DISCONTINUED] ciprofloxacin (CIPRO) 500 MG tablet Take 1 tablet (500 mg total) by mouth 2 (two) times daily.  . [DISCONTINUED] predniSONE (STERAPRED UNI-PAK 21 TAB) 5 MG (21) TBPK tablet Take 1 tablet (5 mg total) by mouth as directed. Use as directed   No facility-administered encounter medications on file as of 05/02/2017.     Activities of Daily Living In your present state of health, do you have any difficulty performing the following activities: 05/02/2017  Hearing? N  Vision? N  Difficulty concentrating or making decisions? N  Walking or climbing stairs? N  Dressing or bathing? N  Doing errands, shopping? N  Preparing Food and eating ? N  Using the Toilet? N  In the past six months, have you accidently leaked urine? Y  Do you have problems with loss of bowel control? N  Managing your Medications? N  Managing your Finances? N  Housekeeping or managing your Housekeeping? N  Some recent data might be hidden    Patient Care Team: Fayrene Helper, MD as PCP - General    Assessment:    Exercise Activities and Dietary recommendations Current Exercise Habits: Structured exercise class, Type of exercise: Other - see comments;walking (Cardio fit class and silver sneakers), Time (Minutes): 45, Frequency (Times/Week): 4, Weekly Exercise (Minutes/Week): 180, Intensity: Mild, Exercise limited by: None identified  Goals     . Have 3 meals a day      Fall Risk Fall Risk  05/02/2017 06/01/2016 09/25/2015 01/06/2015 08/27/2013  Falls in the past year? No No No No No   Depression Screen PHQ 2/9 Scores 05/02/2017 01/27/2016 01/06/2015 08/27/2013  PHQ - 2 Score 0 0 0 0  PHQ- 9 Score - 0 - -     Cognitive Function: Normal   6CIT Screen 05/02/2017  What Year? 0 points  What month? 0 points  What time? 0 points  Count back from 20 0 points  Months in reverse 0 points  Repeat phrase 0 points  Total Score 0    Immunization History  Administered Date(s) Administered  . Influenza Split 08/11/2011, 08/23/2012  . Influenza,inj,Quad PF,36+ Mos 08/10/2013, 08/10/2014, 06/30/2015, 07/12/2016  . Pneumococcal Conjugate-13 05/07/2014  . Pneumococcal Polysaccharide-23 09/11/2007, 08/11/2011  . Tdap 04/15/2011   Screening Tests Health Maintenance  Topic Date Due  . INFLUENZA VACCINE  05/18/2017  . HEMOGLOBIN A1C  06/25/2017  . FOOT EXAM  08/26/2017  . OPHTHALMOLOGY EXAM  09/27/2017  . MAMMOGRAM  09/16/2018  . TETANUS/TDAP  04/14/2021  . COLONOSCOPY  06/12/2025  . DEXA SCAN  Completed  . Hepatitis C Screening  Completed  . PNA vac Low Risk Adult  Completed      Plan:   I have personally reviewed and noted the following in the patient's chart:   . Medical and social history . Use of alcohol, tobacco or illicit drugs  . Current medications and supplements . Functional ability and status . Nutritional status . Physical activity . Advanced directives . List of other physicians . Hospitalizations, surgeries, and ER visits in previous 12 months . Vitals . Screenings to include cognitive, depression, and falls . Referrals and appointments  In addition, I have reviewed and discussed with patient certain preventive protocols, quality metrics, and best practice recommendations. A written personalized care plan for preventive services as well as general preventive health recommendations were provided to  patient.     Stormy Fabian, LPN  [id number]

## 2017-05-02 NOTE — Patient Instructions (Signed)
[name] , Thank you for taking time to come for your Medicare Wellness Visit. I appreciate your ongoing commitment to your health goals. Please review the following plan we discussed and let me know if I can assist you in the future.   Screening recommendations/referrals: Colonoscopy: Up to date, next due 05/2025 Mammogram: Up to date, next due 09/2017 Bone Density: Up to date Diabetic Eye Exam: Up to date, next due 09/2017 Recommended yearly dental visit for hygiene and checkup  Vaccinations: Influenza vaccine: Up to date, next due 06/2017 Pneumococcal vaccine: Up to date Tdap vaccine: Up to date, next due 03/2021 Shingles vaccine: Done    Advanced directives: Advance directive discussed with you today. I have provided a copy for you to complete at home and have notarized. Once this is complete please bring a copy in to our office so we can scan it into your chart.  Next appointment: Follow up with Dr. Moshe Cipro for your complete physical on 08/29/2017 at 10:20 am. Follow up in 1 year for your annual wellness visit.   Preventive Care 55 Years and Older, Female Preventive care refers to lifestyle choices and visits with your health care provider that can promote health and wellness. What does preventive care include?  A yearly physical exam. This is also called an annual well check.  Dental exams once or twice a year.  Routine eye exams. Ask your health care provider how often you should have your eyes checked.  Personal lifestyle choices, including:  Daily care of your teeth and gums.  Regular physical activity.  Eating a healthy diet.  Avoiding tobacco and drug use.  Limiting alcohol use.  Practicing safe sex.  Taking low-dose aspirin every day.  Taking vitamin and mineral supplements as recommended by your health care provider. What happens during an annual well check? The services and screenings done by your health care provider during your annual well check will  depend on your age, overall health, lifestyle risk factors, and family history of disease. Counseling  Your health care provider may ask you questions about your:  Alcohol use.  Tobacco use.  Drug use.  Emotional well-being.  Home and relationship well-being.  Sexual activity.  Eating habits.  History of falls.  Memory and ability to understand (cognition).  Work and work Statistician.  Reproductive health. Screening  You may have the following tests or measurements:  Height, weight, and BMI.  Blood pressure.  Lipid and cholesterol levels. These may be checked every 5 years, or more frequently if you are over 52 years old.  Skin check.  Lung cancer screening. You may have this screening every year starting at age 90 if you have a 30-pack-year history of smoking and currently smoke or have quit within the past 15 years.  Fecal occult blood test (FOBT) of the stool. You may have this test every year starting at age 21.  Flexible sigmoidoscopy or colonoscopy. You may have a sigmoidoscopy every 5 years or a colonoscopy every 10 years starting at age 12.  Hepatitis C blood test.  Hepatitis B blood test.  Sexually transmitted disease (STD) testing.  Diabetes screening. This is done by checking your blood sugar (glucose) after you have not eaten for a while (fasting). You may have this done every 1-3 years.  Bone density scan. This is done to screen for osteoporosis. You may have this done starting at age 9.  Mammogram. This may be done every 1-2 years. Talk to your health care provider about  how often you should have regular mammograms. Talk with your health care provider about your test results, treatment options, and if necessary, the need for more tests. Vaccines  Your health care provider may recommend certain vaccines, such as:  Influenza vaccine. This is recommended every year.  Tetanus, diphtheria, and acellular pertussis (Tdap, Td) vaccine. You may need a  Td booster every 10 years.  Zoster vaccine. You may need this after age 25.  Pneumococcal 13-valent conjugate (PCV13) vaccine. One dose is recommended after age 39.  Pneumococcal polysaccharide (PPSV23) vaccine. One dose is recommended after age 61. Talk to your health care provider about which screenings and vaccines you need and how often you need them. This information is not intended to replace advice given to you by your health care provider. Make sure you discuss any questions you have with your health care provider. Document Released: 10/31/2015 Document Revised: 06/23/2016 Document Reviewed: 08/05/2015 Elsevier Interactive Patient Education  2017 [place] Prevention in the Home Falls can cause injuries. They can happen to people of all ages. There are many things you can do to make your home safe and to help prevent falls. What can I do on the outside of my home?  Regularly fix the edges of walkways and driveways and fix any cracks.  Remove anything that might make you trip as you walk through a door, such as a raised step or threshold.  Trim any bushes or trees on the path to your home.  Use bright outdoor lighting.  Clear any walking paths of anything that might make someone trip, such as rocks or tools.  Regularly check to see if handrails are loose or broken. Make sure that both sides of any steps have handrails.  Any raised decks and porches should have guardrails on the edges.  Have any leaves, snow, or ice cleared regularly.  Use sand or salt on walking paths during winter.  Clean up any spills in your garage right away. This includes oil or grease spills. What can I do in the bathroom?  Use night lights.  Install grab bars by the toilet and in the tub and shower. Do not use towel bars as grab bars.  Use non-skid mats or decals in the tub or shower.  If you need to sit down in the shower, use a plastic, non-slip stool.  Keep the floor dry. Clean  up any water that spills on the floor as soon as it happens.  Remove soap buildup in the tub or shower regularly.  Attach bath mats securely with double-sided non-slip rug tape.  Do not have throw rugs and other things on the floor that can make you trip. What can I do in the bedroom?  Use night lights.  Make sure that you have a light by your bed that is easy to reach.  Do not use any sheets or blankets that are too big for your bed. They should not hang down onto the floor.  Have a firm chair that has side arms. You can use this for support while you get dressed.  Do not have throw rugs and other things on the floor that can make you trip. What can I do in the kitchen?  Clean up any spills right away.  Avoid walking on wet floors.  Keep items that you use a lot in easy-to-reach places.  If you need to reach something above you, use a strong step stool that has a grab bar.  Keep  electrical cords out of the way.  Do not use floor polish or wax that makes floors slippery. If you must use wax, use non-skid floor wax.  Do not have throw rugs and other things on the floor that can make you trip. What can I do with my stairs?  Do not leave any items on the stairs.  Make sure that there are handrails on both sides of the stairs and use them. Fix handrails that are broken or loose. Make sure that handrails are as long as the stairways.  Check any carpeting to make sure that it is firmly attached to the stairs. Fix any carpet that is loose or worn.  Avoid having throw rugs at the top or bottom of the stairs. If you do have throw rugs, attach them to the floor with carpet tape.  Make sure that you have a light switch at the top of the stairs and the bottom of the stairs. If you do not have them, ask someone to add them for you. What else can I do to help prevent falls?  Wear shoes that:  Do not have high heels.  Have rubber bottoms.  Are comfortable and fit you well.  Are  closed at the toe. Do not wear sandals.  If you use a stepladder:  Make sure that it is fully opened. Do not climb a closed stepladder.  Make sure that both sides of the stepladder are locked into place.  Ask someone to hold it for you, if possible.  Clearly mark and make sure that you can see:  Any grab bars or handrails.  First and last steps.  Where the edge of each step is.  Use tools that help you move around (mobility aids) if they are needed. These include:  Canes.  Walkers.  Scooters.  Crutches.  Turn on the lights when you go into a dark area. Replace any light bulbs as soon as they burn out.  Set up your furniture so you have a clear path. Avoid moving your furniture around.  If any of your floors are uneven, fix them.  If there are any pets around you, be aware of where they are.  Review your medicines with your doctor. Some medicines can make you feel dizzy. This can increase your chance of falling. Ask your doctor what other things that you can do to help prevent falls. This information is not intended to replace advice given to you by your health care provider. Make sure you discuss any questions you have with your health care provider. Document Released: 07/31/2009 Document Revised: 03/11/2016 Document Reviewed: 11/08/2014 Elsevier Interactive Patient Education  2017 [employer].

## 2017-05-03 NOTE — Telephone Encounter (Signed)
Labs ordered.

## 2017-05-03 NOTE — Telephone Encounter (Signed)
-----   Message from Fayrene Helper, MD sent at 05/02/2017  8:28 PM EDT ----- Regarding: pls sched regular follow up early September and she will need fasting labs  3 to 5 days before , let her know  pls order these labs CBC, fasting lipid, cmp and EGR, hBA1C, microalb, Vit D Dx are t diabetes, hypertension and hyperlipidemia  ?? pls ask!~

## 2017-06-07 ENCOUNTER — Other Ambulatory Visit: Payer: Self-pay | Admitting: Family Medicine

## 2017-06-21 ENCOUNTER — Ambulatory Visit: Payer: Medicare HMO | Admitting: Family Medicine

## 2017-06-24 DIAGNOSIS — E118 Type 2 diabetes mellitus with unspecified complications: Secondary | ICD-10-CM | POA: Diagnosis not present

## 2017-06-24 DIAGNOSIS — I10 Essential (primary) hypertension: Secondary | ICD-10-CM | POA: Diagnosis not present

## 2017-06-24 DIAGNOSIS — D539 Nutritional anemia, unspecified: Secondary | ICD-10-CM | POA: Diagnosis not present

## 2017-06-24 DIAGNOSIS — E559 Vitamin D deficiency, unspecified: Secondary | ICD-10-CM | POA: Diagnosis not present

## 2017-06-24 DIAGNOSIS — E785 Hyperlipidemia, unspecified: Secondary | ICD-10-CM | POA: Diagnosis not present

## 2017-06-25 LAB — CBC
HEMATOCRIT: 34.3 % — AB (ref 35.0–45.0)
HEMOGLOBIN: 11.6 g/dL — AB (ref 11.7–15.5)
MCH: 30.9 pg (ref 27.0–33.0)
MCHC: 33.8 g/dL (ref 32.0–36.0)
MCV: 91.5 fL (ref 80.0–100.0)
MPV: 10.9 fL (ref 7.5–12.5)
Platelets: 243 10*3/uL (ref 140–400)
RBC: 3.75 10*6/uL — AB (ref 3.80–5.10)
RDW: 11.9 % (ref 11.0–15.0)
WBC: 4 10*3/uL (ref 3.8–10.8)

## 2017-06-25 LAB — COMPLETE METABOLIC PANEL WITH GFR
AG Ratio: 1.8 (calc) (ref 1.0–2.5)
ALT: 11 U/L (ref 6–29)
AST: 17 U/L (ref 10–35)
Albumin: 4.2 g/dL (ref 3.6–5.1)
Alkaline phosphatase (APISO): 73 U/L (ref 33–130)
BUN: 16 mg/dL (ref 7–25)
CALCIUM: 9.9 mg/dL (ref 8.6–10.4)
CO2: 29 mmol/L (ref 20–32)
CREATININE: 0.88 mg/dL (ref 0.60–0.93)
Chloride: 103 mmol/L (ref 98–110)
GFR, Est African American: 77 mL/min/{1.73_m2} (ref >=60)
GFR, Est Non African American: 66 mL/min/{1.73_m2} (ref >=60)
GLOBULIN: 2.3 g/dL (ref 1.9–3.7)
GLUCOSE: 134 mg/dL — AB (ref 65–99)
Potassium: 3.9 mmol/L (ref 3.5–5.3)
Sodium: 140 mmol/L (ref 135–146)
Total Bilirubin: 0.8 mg/dL (ref 0.2–1.2)
Total Protein: 6.5 g/dL (ref 6.1–8.1)

## 2017-06-25 LAB — HEMOGLOBIN A1C
EAG (MMOL/L): 8.1 (calc)
Hgb A1c MFr Bld: 6.7 % of total Hgb — ABNORMAL HIGH (ref <5.7)
Mean Plasma Glucose: 146 (calc)

## 2017-06-25 LAB — LIPID PANEL
CHOL/HDL RATIO: 1.7 (calc) (ref <5.0)
CHOLESTEROL: 200 mg/dL — AB (ref <200)
HDL: 115 mg/dL (ref >50)
LDL Cholesterol (Calc): 71 mg/dL (calc)
NON-HDL CHOLESTEROL (CALC): 85 mg/dL (ref <130)
Triglycerides: 65 mg/dL (ref <150)

## 2017-06-25 LAB — VITAMIN D 25 HYDROXY (VIT D DEFICIENCY, FRACTURES): Vit D, 25-Hydroxy: 53 ng/mL (ref 30–100)

## 2017-06-25 LAB — TSH: TSH: 1.08 mIU/L (ref 0.40–4.50)

## 2017-06-30 ENCOUNTER — Encounter: Payer: Self-pay | Admitting: Family Medicine

## 2017-06-30 ENCOUNTER — Ambulatory Visit (INDEPENDENT_AMBULATORY_CARE_PROVIDER_SITE_OTHER): Payer: Medicare HMO | Admitting: Family Medicine

## 2017-06-30 VITALS — HR 67 | Temp 98.8°F | Resp 16 | Ht 67.0 in | Wt 153.5 lb

## 2017-06-30 DIAGNOSIS — E785 Hyperlipidemia, unspecified: Secondary | ICD-10-CM | POA: Diagnosis not present

## 2017-06-30 DIAGNOSIS — D508 Other iron deficiency anemias: Secondary | ICD-10-CM

## 2017-06-30 DIAGNOSIS — I10 Essential (primary) hypertension: Secondary | ICD-10-CM | POA: Diagnosis not present

## 2017-06-30 MED ORDER — BENAZEPRIL HCL 40 MG PO TABS: 40.0000 mg | ORAL_TABLET | Freq: Every day | ORAL | 1 refills | Status: DC

## 2017-06-30 MED ORDER — LOVASTATIN 40 MG PO TABS: 40.0000 mg | ORAL_TABLET | Freq: Every day | ORAL | 1 refills | Status: DC

## 2017-06-30 MED ORDER — ATENOLOL-CHLORTHALIDONE 100-25 MG PO TABS: 1.0000 | ORAL_TABLET | Freq: Every day | ORAL | 1 refills | Status: DC

## 2017-06-30 MED ORDER — AMLODIPINE BESYLATE 10 MG PO TABS: 10.0000 mg | ORAL_TABLET | Freq: Every day | ORAL | 1 refills | Status: DC

## 2017-06-30 NOTE — Patient Instructions (Addendum)
Physical exam in 3 months, call if you need me sooner  Flu vaccine asap  Nurse BP check 2nd week in October  NEW dose tenortetic is 100/25 ONE daily, stop old dose once you get this (atenolol/chlorthalidone)  Start oTC slow iron (Slow Fe) every Mon, Wed, Fri and Sunday  CBC, and anemia panel, and chem7 and EGFR non fasting 1 week before next visit  Excellent blood sugar and kidney function, please reduyce fried and fatty foods   It is important that you exercise regularly at least 30 minutes 5 times a week. If you develop chest pain, have severe difficulty breathing, or feel very tired, stop exercising immediately and seek medical attention

## 2017-07-01 DIAGNOSIS — D649 Anemia, unspecified: Secondary | ICD-10-CM | POA: Insufficient documentation

## 2017-07-01 NOTE — Assessment & Plan Note (Signed)
Hyperlipidemia:Low fat diet discussed and encouraged.   Lipid Panel  Lab Results  Component Value Date   CHOL 200 (H) 06/24/2017   HDL 115 06/24/2017   LDLCALC 59 12/23/2016   TRIG 65 06/24/2017   CHOLHDL 1.7 06/24/2017   Controlled, no change in medication

## 2017-07-01 NOTE — Assessment & Plan Note (Signed)
Uncontrolled, increase dose of tenoretic, nursr BP check in 5 weeks DASH diet and commitment to daily physical activity for a minimum of 30 minutes discussed and encouraged, as a part of hypertension management. The importance of attaining a healthy weight is also discussed.  BP/Weight 06/30/2017 05/02/2017 12/27/2016 08/26/2016 06/01/2016 01/27/2016 64/05/4719  Systolic BP - 721 828 833 744 514 604  Diastolic BP - 86 82 84 82 80 64  Wt. (Lbs) 153.5 151.04 156 159 161.12 158 163  BMI 24.04 23.66 24.43 24.9 25.23 24.74 25.52

## 2017-07-01 NOTE — Assessment & Plan Note (Signed)
Controlled, no change in medication Mikayla Jenkins is reminded of the importance of commitment to daily physical activity for 30 minutes or more, as able and the need to limit carbohydrate intake to 30 to 60 grams per meal to help with blood sugar control.   The need to take medication as prescribed, test blood sugar as directed, and to call between visits if there is a concern that blood sugar is uncontrolled is also discussed.   Mikayla Jenkins is reminded of the importance of daily foot exam, annual eye examination, and good blood sugar, blood pressure and cholesterol control.  Diabetic Labs Latest Ref Rng & Units 06/24/2017 12/23/2016 08/21/2016 05/22/2016 01/22/2016  HbA1c <5.7 % of total Hgb 6.7(H) 6.6(H) 6.5(H) 6.7(H) 7.0(H)  Microalbumin Not estab mg/dL - - - - -  Micro/Creat Ratio <30 mcg/mg creat - - - - -  Chol <200 mg/dL 200(H) 183 - 202(H) -  HDL >50 mg/dL 115 111 - 133 -  Calc LDL <100 mg/dL - 59 - 59 -  Triglycerides <150 mg/dL 65 66 - 50 -  Creatinine 0.60 - 0.93 mg/dL 0.88 0.94(H) 1.08(H) 1.09(H) 1.07(H)   BP/Weight 06/30/2017 05/02/2017 12/27/2016 08/26/2016 06/01/2016 01/27/2016 29/02/2840  Systolic BP - 324 401 027 253 664 403  Diastolic BP - 86 82 84 82 80 64  Wt. (Lbs) 153.5 151.04 156 159 161.12 158 163  BMI 24.04 23.66 24.43 24.9 25.23 24.74 25.52   Foot/eye exam completion dates Latest Ref Rng & Units 08/26/2016 04/02/2016  Eye Exam No Retinopathy - No Retinopathy  Foot Form Completion - Done -

## 2017-07-01 NOTE — Assessment & Plan Note (Signed)
Trial of 4 times weekly OTC iron and re eval in 3 months

## 2017-07-01 NOTE — Progress Notes (Signed)
Mikayla Jenkins     MRN: [id number]      DOB: Aug 13, 1946   HPI Mikayla Jenkins is here for follow up and re-evaluation of chronic medical conditions, medication management and review of any available recent lab and radiology data.  Preventive health is updated, specifically  Cancer screening and Immunization.   . The PT denies any adverse reactions to current medications since the last visit.  There are no new concerns.  There are no specific complaints   ROS Denies recent fever or chills. Denies sinus pressure, nasal congestion, ear pain or sore throat. Denies chest congestion, productive cough or wheezing. Denies chest pains, palpitations and leg swelling Denies abdominal pain, nausea, vomiting,diarrhea or constipation.   Denies dysuria, frequency, hesitancy or incontinence. Denies joint pain, swelling and limitation in mobility. Denies headaches, seizures, numbness, or tingling. Denies depression, anxiety or insomnia. Denies skin break down or rash.   PE BP158/80  Pulse 67   Temp 98.8 F (37.1 C) (Other (Comment))   Resp 16   Ht 5\' 7"  (1.702 m)   Wt 153 lb 8 oz (69.6 kg)   SpO2 98%   BMI 24.04 kg/m   Patient alert and oriented and in no cardiopulmonary distress.  HEENT: No facial asymmetry, EOMI,   oropharynx pink and moist.  Neck supple no JVD, no mass.  Chest: Clear to auscultation bilaterally.  CVS: S1, S2 no murmurs, no S3.Regular rate.  ABD: Soft non tender.   Ext: No edema  MS: Adequate though reduced  ROM spine, normal in  shoulders, hips and knees.  Skin: Intact, no ulcerations or rash noted.  Psych: Good eye contact, normal affect. Memory intact not anxious or depressed appearing.  CNS: CN 2-12 intact, power,  normal throughout.no focal deficits noted.   Assessment & Plan  Essential hypertension Uncontrolled, increase dose of tenoretic, nursr BP check in 5 weeks DASH diet and commitment to daily physical activity for a minimum of 30 minutes  discussed and encouraged, as a part of hypertension management. The importance of attaining a healthy weight is also discussed.  BP/Weight 06/30/2017 05/02/2017 12/27/2016 08/26/2016 06/01/2016 01/27/2016 83/10/5174  Systolic BP - 160 737 106 269 485 462  Diastolic BP - 86 82 84 82 80 64  Wt. (Lbs) 153.5 151.04 156 159 161.12 158 163  BMI 24.04 23.66 24.43 24.9 25.23 24.74 25.52       Hyperlipidemia LDL goal <100 Hyperlipidemia:Low fat diet discussed and encouraged.   Lipid Panel  Lab Results  Component Value Date   CHOL 200 (H) 06/24/2017   HDL 115 06/24/2017   LDLCALC 59 12/23/2016   TRIG 65 06/24/2017   CHOLHDL 1.7 06/24/2017   Controlled, no change in medication     Type 2 diabetes mellitus Controlled, no change in medication Mikayla Jenkins is reminded of the importance of commitment to daily physical activity for 30 minutes or more, as able and the need to limit carbohydrate intake to 30 to 60 grams per meal to help with blood sugar control.   The need to take medication as prescribed, test blood sugar as directed, and to call between visits if there is a concern that blood sugar is uncontrolled is also discussed.   Mikayla Jenkins is reminded of the importance of daily foot exam, annual eye examination, and good blood sugar, blood pressure and cholesterol control.  Diabetic Labs Latest Ref Rng & Units 06/24/2017 12/23/2016 08/21/2016 05/22/2016 01/22/2016  HbA1c <5.7 % of total Hgb 6.7(H) 6.6(H) 6.5(H) 6.7(H)  7.0(H)  Microalbumin Not estab mg/dL - - - - -  Micro/Creat Ratio <30 mcg/mg creat - - - - -  Chol <200 mg/dL 200(H) 183 - 202(H) -  HDL >50 mg/dL 115 111 - 133 -  Calc LDL <100 mg/dL - 59 - 59 -  Triglycerides <150 mg/dL 65 66 - 50 -  Creatinine 0.60 - 0.93 mg/dL 0.88 0.94(H) 1.08(H) 1.09(H) 1.07(H)   BP/Weight 06/30/2017 05/02/2017 12/27/2016 08/26/2016 06/01/2016 01/27/2016 10/23/5798  Systolic BP - 634 949 447 395 844 171  Diastolic BP - 86 82 84 82 80 64  Wt. (Lbs) 153.5 151.04 156  159 161.12 158 163  BMI 24.04 23.66 24.43 24.9 25.23 24.74 25.52   Foot/eye exam completion dates Latest Ref Rng & Units 08/26/2016 04/02/2016  Eye Exam No Retinopathy - No Retinopathy  Foot Form Completion - Done -

## 2017-07-19 ENCOUNTER — Telehealth: Payer: Self-pay | Admitting: Family Medicine

## 2017-07-19 DIAGNOSIS — I10 Essential (primary) hypertension: Secondary | ICD-10-CM

## 2017-07-19 DIAGNOSIS — E785 Hyperlipidemia, unspecified: Secondary | ICD-10-CM

## 2017-07-19 MED ORDER — ATENOLOL-CHLORTHALIDONE 100-25 MG PO TABS: 1.0000 | ORAL_TABLET | Freq: Every day | ORAL | 1 refills | Status: DC

## 2017-07-19 NOTE — Telephone Encounter (Signed)
Patient would like atenolol-chlorthalidone called in @ [place] w/ 90 day supply ASAP because she only has a few left.  Please DO NOT call in @ [facility].

## 2017-08-02 ENCOUNTER — Ambulatory Visit (INDEPENDENT_AMBULATORY_CARE_PROVIDER_SITE_OTHER): Payer: Commercial Managed Care - HMO

## 2017-08-02 VITALS — BP 132/80

## 2017-08-02 DIAGNOSIS — Z23 Encounter for immunization: Secondary | ICD-10-CM | POA: Diagnosis not present

## 2017-08-22 DIAGNOSIS — E785 Hyperlipidemia, unspecified: Secondary | ICD-10-CM | POA: Diagnosis not present

## 2017-08-22 DIAGNOSIS — I10 Essential (primary) hypertension: Secondary | ICD-10-CM | POA: Diagnosis not present

## 2017-08-23 LAB — COMPLETE METABOLIC PANEL WITH GFR
AG RATIO: 1.6 (calc) (ref 1.0–2.5)
ALKALINE PHOSPHATASE (APISO): 80 U/L (ref 33–130)
ALT: 13 U/L (ref 6–29)
AST: 17 U/L (ref 10–35)
Albumin: 4.4 g/dL (ref 3.6–5.1)
BILIRUBIN TOTAL: 0.7 mg/dL (ref 0.2–1.2)
BUN/Creatinine Ratio: 17 (calc) (ref 6–22)
BUN: 19 mg/dL (ref 7–25)
CHLORIDE: 100 mmol/L (ref 98–110)
CO2: 31 mmol/L (ref 20–32)
Calcium: 10.3 mg/dL (ref 8.6–10.4)
Creat: 1.13 mg/dL — ABNORMAL HIGH (ref 0.60–0.93)
GFR, Est African American: 56 mL/min/{1.73_m2} — ABNORMAL LOW (ref >=60)
GFR, Est Non African American: 49 mL/min/{1.73_m2} — ABNORMAL LOW (ref >=60)
GLUCOSE: 177 mg/dL — AB (ref 65–139)
Globulin: 2.7 g/dL (calc) (ref 1.9–3.7)
POTASSIUM: 3.7 mmol/L (ref 3.5–5.3)
Sodium: 139 mmol/L (ref 135–146)
Total Protein: 7.1 g/dL (ref 6.1–8.1)

## 2017-08-23 LAB — RETICULOCYTES
ABS Retic: 64320 cells/uL (ref 20000–8000)
RETIC CT PCT: 1.6 %

## 2017-08-23 LAB — CBC
HCT: 36.4 % (ref 35.0–45.0)
Hemoglobin: 12.6 g/dL (ref 11.7–15.5)
MCH: 31.9 pg (ref 27.0–33.0)
MCHC: 34.6 g/dL (ref 32.0–36.0)
MCV: 92.2 fL (ref 80.0–100.0)
MPV: 10.7 fL (ref 7.5–12.5)
PLATELETS: 254 10*3/uL (ref 140–400)
RBC: 3.95 10*6/uL (ref 3.80–5.10)
RDW: 11.6 % (ref 11.0–15.0)
WBC: 6.4 10*3/uL (ref 3.8–10.8)

## 2017-08-23 LAB — FERRITIN: FERRITIN: 40 ng/mL (ref 20–288)

## 2017-08-23 LAB — IRON, TOTAL/TOTAL IRON BINDING CAP
%SAT: 32 % (ref 11–50)
Iron: 116 ug/dL (ref 45–160)
TIBC: 368 mcg/dL (calc) (ref 250–450)

## 2017-08-23 LAB — VITAMIN B12

## 2017-08-23 LAB — FOLATE: Folate: >24 ng/mL

## 2017-08-26 ENCOUNTER — Other Ambulatory Visit: Payer: Self-pay | Admitting: Family Medicine

## 2017-08-26 DIAGNOSIS — Z1231 Encounter for screening mammogram for malignant neoplasm of breast: Secondary | ICD-10-CM

## 2017-08-29 ENCOUNTER — Encounter: Payer: Self-pay | Admitting: Family Medicine

## 2017-08-29 ENCOUNTER — Ambulatory Visit (INDEPENDENT_AMBULATORY_CARE_PROVIDER_SITE_OTHER): Payer: Medicare HMO | Admitting: Family Medicine

## 2017-08-29 VITALS — BP 166/82 | HR 64 | Resp 16 | Ht 67.0 in | Wt 151.8 lb

## 2017-08-29 DIAGNOSIS — Z1211 Encounter for screening for malignant neoplasm of colon: Secondary | ICD-10-CM

## 2017-08-29 DIAGNOSIS — M5431 Sciatica, right side: Secondary | ICD-10-CM

## 2017-08-29 DIAGNOSIS — E785 Hyperlipidemia, unspecified: Secondary | ICD-10-CM

## 2017-08-29 DIAGNOSIS — E1121 Type 2 diabetes mellitus with diabetic nephropathy: Secondary | ICD-10-CM

## 2017-08-29 DIAGNOSIS — Z0000 Encounter for general adult medical examination without abnormal findings: Secondary | ICD-10-CM | POA: Diagnosis not present

## 2017-08-29 DIAGNOSIS — I10 Essential (primary) hypertension: Secondary | ICD-10-CM

## 2017-08-29 LAB — POC HEMOCCULT BLD/STL (OFFICE/1-CARD/DIAGNOSTIC): Fecal Occult Blood, POC: NEGATIVE

## 2017-08-29 MED ORDER — AMLODIPINE BESYLATE 10 MG PO TABS: 10.0000 mg | ORAL_TABLET | Freq: Every day | ORAL | 1 refills | Status: DC

## 2017-08-29 MED ORDER — BENAZEPRIL HCL 40 MG PO TABS: 40.0000 mg | ORAL_TABLET | Freq: Every day | ORAL | 1 refills | Status: DC

## 2017-08-29 MED ORDER — LOVASTATIN 40 MG PO TABS: 40.0000 mg | ORAL_TABLET | Freq: Every day | ORAL | 1 refills | Status: DC

## 2017-08-29 MED ORDER — METFORMIN HCL 850 MG PO TABS: ORAL_TABLET | ORAL | 1 refills | Status: DC

## 2017-08-29 MED ORDER — HYDROCODONE-ACETAMINOPHEN 5-325 MG PO TABS: ORAL_TABLET | ORAL | 0 refills | Status: DC

## 2017-08-29 NOTE — Patient Instructions (Addendum)
F/u in 4.5 months, call if you need me before  Labs for nex t visit  Fasting are lipid, cmp and EGFr and HBA1C   You are being referred  For epidural injection, pls call if not effective as far as relief is concerned   You are referred to cardiology because of your blood pressure and to further evaluate your heart  Thirty hydrocodone  Tabs prescribed for as needed use  Hope the pain is relieved shortly    Thanks for choosing [facility], we consider it a privelige to serve you.

## 2017-08-30 NOTE — Progress Notes (Signed)
    Mikayla Jenkins     MRN: [id number]      DOB: May 28, 1946  HPI: Patient is in for annual physical exam. Six month h/o disabling back pain radiating to RLE limiting ability to exercise , uncontrolled and worsening, ready to do "whatever needs to be done" as  Quality of life is poor Recent labs, are reviewed. Immunization is reviewed , and is up to   update   PE: BP (!) 166/82   Pulse 64   Resp 16   Ht 5\' 7"  (1.702 m)   Wt 151 lb 12.8 oz (68.9 kg)   SpO2 98%   BMI 23.78 kg/m   Pleasant  female, alert and oriented x 3, in no cardio-pulmonary distress.Pt in pain Afebrile. HEENT No facial trauma or asymetry. Sinuses non tender.  Extra occullar muscles intact, pupils equally reactive to light. External ears normal, tympanic membranes clear. Oropharynx moist, no exudate. Neck: supple, no adenopathy,JVD or thyromegaly.No bruits.  Chest: Clear to ascultation bilaterally.No crackles or wheezes. Non tender to palpation  Breast: No asymetry,no masses or lumps. No tenderness. No nipple discharge or inversion. No axillary or supraclavicular adenopathy  Cardiovascular system; Heart sounds normal,  S1 and  S2 ,no S3.  No murmur, or thrill. Apical beat not displaced Peripheral pulses normal.  Abdomen: Soft, non tender, no organomegaly or masses. No bruits. Bowel sounds normal. No guarding, tenderness or rebound.  Rectal:  Normal sphincter tone. No rectal mass. Guaiac negative stool.  GU: Not examined  Musculoskeletal exam:Decreased ROM lumbar spine  normal in shoulders and  knees. No deformity ,swelling or crepitus noted. No muscle wasting or atrophy.   Neurologic: Cranial nerves 2 to 12 intact. Power, tone ,sensation and reflexes normal throughout. No disturbance in gait. No tremor.  Skin: Intact, no ulceration, erythema , scaling or rash noted. Pigmentation normal throughout  Psych; Normal mood and affect. Judgement and concentration normal   Assessment &  Plan:  No problem-specific Assessment & Plan notes found for this encounter.

## 2017-08-30 NOTE — Assessment & Plan Note (Addendum)
Uncontrolled despite multipple medicatiponns,  needs cardiology evaluation for help with management ,  Needs echocardiogram , and also due to increased CV risk needs evaluation for this, will refer

## 2017-08-30 NOTE — Assessment & Plan Note (Addendum)
Annual exam as documented. . Immunization and cancer screening needs are specifically addressed at this visit.  

## 2017-08-30 NOTE — Assessment & Plan Note (Addendum)
6 month h/o progressively disabling lumbar pain radiating to RLE, has established severe arthritis and disc disease, has benefited in the past from epidural, will refer for same Limited amount of hydrocodone prescribed

## 2017-08-31 ENCOUNTER — Encounter: Payer: Self-pay | Admitting: Family Medicine

## 2017-08-31 NOTE — Assessment & Plan Note (Addendum)
Controlled, no change in medication Mikayla Jenkins is reminded of the importance of commitment to daily physical activity for 30 minutes or more, as able and the need to limit carbohydrate intake to 30 to 60 grams per meal to help with blood sugar control.   The need to take medication as prescribed, test blood sugar as directed, and to call between visits if there is a concern that blood sugar is uncontrolled is also discussed.   Mikayla Jenkins is reminded of the importance of daily foot exam, annual eye examination, and good blood sugar, blood pressure and cholesterol control.  Diabetic Labs Latest Ref Rng & Units 08/22/2017 06/24/2017 12/23/2016 08/21/2016 05/22/2016  HbA1c <5.7 % of total Hgb - 6.7(H) 6.6(H) 6.5(H) 6.7(H)  Microalbumin Not estab mg/dL - - - - -  Micro/Creat Ratio <30 mcg/mg creat - - - - -  Chol <200 mg/dL - 200(H) 183 - 202(H)  HDL >50 mg/dL - 115 111 - 133  Calc LDL <100 mg/dL - - 59 - 59  Triglycerides <150 mg/dL - 65 66 - 50  Creatinine 0.60 - 0.93 mg/dL 1.13(H) 0.88 0.94(H) 1.08(H) 1.09(H)   BP/Weight 08/29/2017 08/02/2017 06/30/2017 05/02/2017 12/27/2016 08/26/2016 05/29/7516  Systolic BP 001 749 - 449 675 916 384  Diastolic BP 82 80 - 86 82 84 82  Wt. (Lbs) 151.8 - 153.5 151.04 156 159 161.12  BMI 23.78 - 24.04 23.66 24.43 24.9 25.23   Foot/eye exam completion dates Latest Ref Rng & Units 08/29/2017 08/26/2016  Eye Exam No Retinopathy - -  Foot Form Completion - Done Done

## 2017-09-05 ENCOUNTER — Encounter: Payer: Self-pay | Admitting: Cardiovascular Disease

## 2017-09-05 ENCOUNTER — Ambulatory Visit: Payer: Medicare HMO | Admitting: Cardiovascular Disease

## 2017-09-05 VITALS — BP 168/86 | HR 66 | Ht 67.0 in | Wt 151.0 lb

## 2017-09-05 DIAGNOSIS — E785 Hyperlipidemia, unspecified: Secondary | ICD-10-CM

## 2017-09-05 DIAGNOSIS — E119 Type 2 diabetes mellitus without complications: Secondary | ICD-10-CM

## 2017-09-05 DIAGNOSIS — I10 Essential (primary) hypertension: Secondary | ICD-10-CM

## 2017-09-05 MED ORDER — SPIRONOLACTONE 25 MG PO TABS: 25.0000 mg | ORAL_TABLET | Freq: Every day | ORAL | 3 refills | Status: DC

## 2017-09-05 NOTE — Progress Notes (Signed)
CARDIOLOGY CONSULT NOTE  Patient ID: Mikayla Jenkins MRN: [id number] DOB/AGE: [birth year] 72 y.o.  Admit date: (Not on file) Primary Physician: Fayrene Helper, MD Referring Physician: Dr. Moshe Cipro  Reason for Consultation: Malignant hypertension  HPI: Mikayla Jenkins is a 71 y.o. female who is being seen today for the evaluation of malignant hypertension at the request of Fayrene Helper, MD.   Past medical history also includes dyslipidemia and type 2 diabetes mellitus.  Labs 08/22/17: BUN 19, creatinine 1.13.  Creatinine had been 0.88 on 06/24/17.  Sodium 139, potassium 3.7, hemoglobin 11.6.  Antihypertensive therapy currently includes amlodipine 10 mg, atenolol 100 mg, chlorthalidone 25 mg, benazepril 40 mg, and clonidine 0.3 mg.  ECG performed today which I personally interpreted demonstrated sinus rhythm with late R wave transition and diffuse precordial T wave abnormalities/inversions.  She is here with her eldest daughter.  She has another son and daughter.  Her other daughter lives in [place] and is a doctor of pharmacy.  The patient adheres to a fairly strict diet and avoid salt.  She has "excruciating right leg sciatica "and has previously received 2 epidural injections for this, the most recent one being in 2015.  She has been scheduled to undergo another one.  She finds it debilitating and difficult to walk.  She denies chest pain, palpitations, and shortness of breath.  She has had some mild right-sided headaches lately but nothing too bothersome.  She wears contact lenses and did have a recent change in her prescription.  Systolic blood pressures have been in the 160 range over the last several months.   Social History: She is married. She is here with her eldest daughter.  She has another son and daughter.  Her other daughter lives in [place] and is a doctor of pharmacy.  No Known Allergies  Current Outpatient Medications  Medication Sig Dispense Refill    . amLODipine (NORVASC) 10 MG tablet Take 1 tablet (10 mg total) daily by mouth. 90 tablet 1  . aspirin (ASPIRIN LOW DOSE) 81 MG EC tablet Take 81 mg by mouth daily. Take 1 tablet by mouth once a day     . atenolol-chlorthalidone (TENORETIC 100) 100-25 MG tablet Take 1 tablet by mouth daily. 90 tablet 1  . benazepril (LOTENSIN) 40 MG tablet Take 1 tablet (40 mg total) daily by mouth. 90 tablet 1  . Calcium Carbonate-Vitamin D (CALCIUM 600/VITAMIN D) 600-400 MG-UNIT per tablet Take 1 tablet by mouth daily.    . cloNIDine (CATAPRES) 0.3 MG tablet TAKE 1 AND 1/2 TABLETS AT BEDTIME 135 tablet 1  . HYDROcodone-acetaminophen (NORCO/VICODIN) 5-325 MG tablet One tablet at bedtime , as needed, for back and right leg pain 30 tablet 0  . lovastatin (MEVACOR) 40 MG tablet Take 1 tablet (40 mg total) daily by mouth. 90 tablet 1  . metFORMIN (GLUCOPHAGE) 850 MG tablet TAKE 1 TABLET EVERY DAY WITH BREAKFAST 90 tablet 1  . Multiple Vitamin (MULTIVITAMIN) tablet Take 1 tablet by mouth daily.    . TRUE METRIX BLOOD GLUCOSE TEST test strip TEST BLOOD SUGAR ONE TIME DAILY 100 each 1   No current facility-administered medications for this visit.     Past Medical History:  Diagnosis Date  . Diabetes mellitus type II   . Hyperlipidemia   . Hypertension     Past Surgical History:  Procedure Laterality Date  . ABDOMINAL HYSTERECTOMY    . COLONOSCOPY N/A 06/13/2015   Performed by Laural Golden,  Mechele Dawley, MD at [facility]  . HERNIA REPAIR    . inguinal herniorrhapy right    . right eye surgery secondary to right eye weakness    . SALPINGOOPHORECTOMY    . TUBAL LIGATION      Social History   Socioeconomic History  . Marital status: Married    Spouse name: Not on file  . Number of children: Not on file  . Years of education: Not on file  . Highest education level: Not on file  Social Needs  . Financial resource strain: Not on file  . Food insecurity - worry: Not on file  . Food insecurity - inability:  Not on file  . Transportation needs - medical: Not on file  . Transportation needs - non-medical: Not on file  Occupational History  . Not on file  Tobacco Use  . Smoking status: Never Smoker  . Smokeless tobacco: Never Used  Substance and Sexual Activity  . Alcohol use: No  . Drug use: No  . Sexual activity: Yes    Birth control/protection: Surgical  Other Topics Concern  . Not on file  Social History Narrative  . Not on file     No family history of premature CAD in 1st degree relatives.  Current Meds  Medication Sig  . amLODipine (NORVASC) 10 MG tablet Take 1 tablet (10 mg total) daily by mouth.  Marland Kitchen aspirin (ASPIRIN LOW DOSE) 81 MG EC tablet Take 81 mg by mouth daily. Take 1 tablet by mouth once a day   . atenolol-chlorthalidone (TENORETIC 100) 100-25 MG tablet Take 1 tablet by mouth daily.  . benazepril (LOTENSIN) 40 MG tablet Take 1 tablet (40 mg total) daily by mouth.  . Calcium Carbonate-Vitamin D (CALCIUM 600/VITAMIN D) 600-400 MG-UNIT per tablet Take 1 tablet by mouth daily.  . cloNIDine (CATAPRES) 0.3 MG tablet TAKE 1 AND 1/2 TABLETS AT BEDTIME  . HYDROcodone-acetaminophen (NORCO/VICODIN) 5-325 MG tablet One tablet at bedtime , as needed, for back and right leg pain  . lovastatin (MEVACOR) 40 MG tablet Take 1 tablet (40 mg total) daily by mouth.  . metFORMIN (GLUCOPHAGE) 850 MG tablet TAKE 1 TABLET EVERY DAY WITH BREAKFAST  . Multiple Vitamin (MULTIVITAMIN) tablet Take 1 tablet by mouth daily.  . TRUE METRIX BLOOD GLUCOSE TEST test strip TEST BLOOD SUGAR ONE TIME DAILY      Review of systems complete and found to be negative unless listed above in HPI    Physical exam Blood pressure (!) 168/86, pulse 66, height 5\' 7"  (1.702 m), weight 151 lb (68.5 kg), SpO2 97 %. General: NAD Neck: No JVD, no thyromegaly or thyroid nodule.  Lungs: Clear to auscultation bilaterally with normal respiratory effort. CV: Nondisplaced PMI. Regular rate and rhythm, normal S1/S2, no  S3/S4, no murmur.  No peripheral edema.  No carotid bruit.    Abdomen: Soft, nontender, no distention.  Skin: Intact without lesions or rashes.  Neurologic: Alert and oriented x 3.  Psych: Normal affect. Extremities: No clubbing or cyanosis.  HEENT: Normal.   ECG: Most recent ECG reviewed.   Labs: Lab Results  Component Value Date/Time   K 3.7 08/22/2017 02:26 PM   BUN 19 08/22/2017 02:26 PM   CREATININE 1.13 (H) 08/22/2017 02:26 PM   ALT 13 08/22/2017 02:26 PM   TSH 1.08 06/24/2017 11:34 AM   HGB 12.6 08/22/2017 02:26 PM     Lipids: Lab Results  Component Value Date/Time   LDLCALC 59 12/23/2016 11:41 AM  CHOL 200 (H) 06/24/2017 11:34 AM   TRIG 65 06/24/2017 11:34 AM   HDL 115 06/24/2017 11:34 AM        ASSESSMENT AND PLAN:  1.  Malignant hypertension: Blood pressure remains markedly elevated with multiple medications.  This is likely being exacerbated by excruciating right-sided back and leg pain due to sciatica, for which she is being scheduled to undergo an epidural injection.  ECG is abnormal and I suspect she likely has some degree of left ventricular hypertrophy given her long-standing hypertension.  I will obtain an echocardiogram to evaluate cardiac structure and function.  I will obtain renal artery Dopplers to evaluate for renal artery stenosis.  I will also start spironolactone 25 mg daily and check a basic metabolic panel within 5 days.  2.  Dyslipidemia: Continue statin therapy.  3.  Type 2 diabetes mellitus: Currently on metformin.    Disposition: Follow up in 6-8 weeks   Signed: Kate Sable, M.D., F.A.C.C.  09/05/2017, 10:31 AM

## 2017-09-05 NOTE — Patient Instructions (Addendum)
Your physician recommends that you schedule a follow-up appointment in: 6-8 weeks with Dr.Koneswaran     START Spironolactone 25 mg every day    Get lab work :BMET  5 days after starting Spironolactone    Your physician has requested that you have an echocardiogram. Echocardiography is a painless test that uses sound waves to create images of your heart. It provides your doctor with information about the size and shape of your heart and how well your heart's chambers and valves are working. This procedure takes approximately one hour. There are no restrictions for this procedure.     Your physician has requested that you have a renal artery duplex. During this test, an ultrasound is used to evaluate blood flow to the kidneys. Allow one hour for this exam. Do not eat after midnight the day before and avoid carbonated beverages. Take your medications as you usually do.   Thank you for choosing [facility] !

## 2017-09-06 ENCOUNTER — Telehealth: Payer: Self-pay | Admitting: Cardiovascular Disease

## 2017-09-06 ENCOUNTER — Ambulatory Visit (HOSPITAL_COMMUNITY)
Admission: RE | Admit: 2017-09-06 | Discharge: 2017-09-06 | Disposition: A | Discharge status: Home / Self Care | Payer: Medicare HMO | Source: Ambulatory Visit | Attending: Cardiovascular Disease | Admitting: Cardiovascular Disease

## 2017-09-06 ENCOUNTER — Encounter (INDEPENDENT_AMBULATORY_CARE_PROVIDER_SITE_OTHER): Payer: Self-pay

## 2017-09-06 DIAGNOSIS — I10 Essential (primary) hypertension: Secondary | ICD-10-CM | POA: Insufficient documentation

## 2017-09-06 DIAGNOSIS — E785 Hyperlipidemia, unspecified: Secondary | ICD-10-CM | POA: Diagnosis not present

## 2017-09-06 DIAGNOSIS — E119 Type 2 diabetes mellitus without complications: Secondary | ICD-10-CM | POA: Insufficient documentation

## 2017-09-06 LAB — ECHOCARDIOGRAM COMPLETE
AVLVOTPG: 3 mmHg
CHL CUP STROKE VOLUME: 46 mL
E decel time: 289 msec
E/e' ratio: 5.04
FS: 34 % (ref 28–44)
IVS/LV PW RATIO, ED: 1.01
LA diam end sys: 33 mm
LA diam index: 1.83 cm/m2
LA vol index: 23.1 mL/m2
LA vol: 41.7 mL
LASIZE: 33 mm
LAVOLA4C: 46 mL
LV PW d: 10.1 mm — AB (ref 0.6–1.1)
LV SIMPSON'S DISK: 69
LV TDI E'LATERAL: 8.38
LV dias vol index: 37 mL/m2
LV e' LATERAL: 8.38 cm/s
LVDIAVOL: 68 mL (ref 46–106)
LVEEAVG: 5.04
LVEEMED: 5.04
LVOT SV: 72 mL
LVOT VTI: 22.8 cm
LVOT area: 3.14 cm2
LVOT peak vel: 87.1 cm/s
LVOTD: 20 mm
LVSYSVOL: 21 mL
LVSYSVOLIN: 12 mL/m2
Lateral S' vel: 10.1 cm/s
MV Dec: 289
MVPKAVEL: 86.7 m/s
MVPKEVEL: 42.2 m/s
TAPSE: 16.7 mm
TDI e' medial: 6.31

## 2017-09-06 NOTE — Telephone Encounter (Signed)
Pre-cert Verification for the following procedure   Renal artery Duplex scheduled for 09-14-17

## 2017-09-06 NOTE — Progress Notes (Signed)
*  PRELIMINARY RESULTS* Echocardiogram 2D Echocardiogram has been performed.  Mikayla Jenkins 09/06/2017, 10:14 AM

## 2017-09-12 ENCOUNTER — Other Ambulatory Visit (HOSPITAL_COMMUNITY)
Admission: RE | Admit: 2017-09-12 | Discharge: 2017-09-12 | Disposition: A | Discharge status: Home / Self Care | Payer: Medicare HMO | Source: Ambulatory Visit | Attending: Cardiovascular Disease | Admitting: Cardiovascular Disease

## 2017-09-12 DIAGNOSIS — I10 Essential (primary) hypertension: Secondary | ICD-10-CM | POA: Insufficient documentation

## 2017-09-12 LAB — BASIC METABOLIC PANEL
Anion gap: 10 (ref 5–15)
BUN: 26 mg/dL — ABNORMAL HIGH (ref 6–20)
CALCIUM: 10.5 mg/dL — AB (ref 8.9–10.3)
CHLORIDE: 99 mmol/L — AB (ref 101–111)
CO2: 27 mmol/L (ref 22–32)
CREATININE: 1.06 mg/dL — AB (ref 0.44–1.00)
GFR calc Af Amer: 59 mL/min — ABNORMAL LOW (ref >60)
GFR calc non Af Amer: 51 mL/min — ABNORMAL LOW (ref >60)
GLUCOSE: 153 mg/dL — AB (ref 65–99)
Potassium: 4 mmol/L (ref 3.5–5.1)
Sodium: 136 mmol/L (ref 135–145)

## 2017-09-14 ENCOUNTER — Ambulatory Visit (INDEPENDENT_AMBULATORY_CARE_PROVIDER_SITE_OTHER): Payer: Medicare HMO

## 2017-09-14 DIAGNOSIS — I10 Essential (primary) hypertension: Secondary | ICD-10-CM | POA: Diagnosis not present

## 2017-09-19 ENCOUNTER — Ambulatory Visit (HOSPITAL_COMMUNITY)
Admission: RE | Admit: 2017-09-19 | Discharge: 2017-09-19 | Disposition: A | Discharge status: Home / Self Care | Payer: Medicare HMO | Source: Ambulatory Visit | Attending: Family Medicine | Admitting: Family Medicine

## 2017-09-19 DIAGNOSIS — Z1231 Encounter for screening mammogram for malignant neoplasm of breast: Secondary | ICD-10-CM | POA: Insufficient documentation

## 2017-09-20 ENCOUNTER — Ambulatory Visit (INDEPENDENT_AMBULATORY_CARE_PROVIDER_SITE_OTHER): Payer: Medicare HMO

## 2017-09-20 ENCOUNTER — Ambulatory Visit (INDEPENDENT_AMBULATORY_CARE_PROVIDER_SITE_OTHER): Payer: Medicare HMO | Admitting: Physical Medicine and Rehabilitation

## 2017-09-20 ENCOUNTER — Encounter (INDEPENDENT_AMBULATORY_CARE_PROVIDER_SITE_OTHER): Payer: Self-pay | Admitting: Physical Medicine and Rehabilitation

## 2017-09-20 VITALS — BP 153/74 | HR 60 | Temp 98.1°F

## 2017-09-20 DIAGNOSIS — M48062 Spinal stenosis, lumbar region with neurogenic claudication: Secondary | ICD-10-CM | POA: Diagnosis not present

## 2017-09-20 DIAGNOSIS — M25551 Pain in right hip: Secondary | ICD-10-CM

## 2017-09-20 DIAGNOSIS — M1611 Unilateral primary osteoarthritis, right hip: Secondary | ICD-10-CM

## 2017-09-20 DIAGNOSIS — M47816 Spondylosis without myelopathy or radiculopathy, lumbar region: Secondary | ICD-10-CM | POA: Diagnosis not present

## 2017-09-20 DIAGNOSIS — M5416 Radiculopathy, lumbar region: Secondary | ICD-10-CM

## 2017-09-20 MED ORDER — LIDOCAINE HCL (PF) 1 % IJ SOLN
2.0000 mL | Freq: Once | INTRAMUSCULAR | Status: AC
  Administered 2017-09-20: 2 mL

## 2017-09-20 MED ORDER — METHYLPREDNISOLONE ACETATE 80 MG/ML IJ SUSP
80.0000 mg | Freq: Once | INTRAMUSCULAR | Status: AC
  Administered 2017-09-20: 80 mg

## 2017-09-20 NOTE — Patient Instructions (Signed)

## 2017-09-20 NOTE — Progress Notes (Deleted)
C/o low back pain and pain into Right leg pain.  + Driver, - Blood thinner, - Dye allergy

## 2017-09-20 NOTE — Progress Notes (Deleted)
Mikayla Jenkins - 71 y.o. female MRN [id number]  Date of birth: [birth year]  Office Visit Note: Visit Date: 09/20/2017 PCP: Fayrene Helper, MD Referred by: Fayrene Helper, MD  Subjective: Chief Complaint  Patient presents with  . Lower Back - Pain   HPI: HPI  ROS Otherwise per HPI.  Assessment & Plan: Visit Diagnoses:  1. Lumbar radiculopathy     Plan: No additional findings.   Meds & Orders:  Meds ordered this encounter  Medications  . lidocaine (PF) (XYLOCAINE) 1 % injection 2 mL  . methylPREDNISolone acetate (DEPO-MEDROL) injection 80 mg    Orders Placed This Encounter  Procedures  . XR C-ARM NO REPORT  . Epidural Steroid injection    Follow-up: No Follow-up on file.   Procedures: Large Joint Inj: R hip joint on 09/20/2017 2:42 PM Indications: diagnostic evaluation and pain Details: 22 G 3.5 in needle, fluoroscopy-guided anterior approach  Arthrogram: No  Medications: 80 mg triamcinolone acetonide 40 MG/ML; 3 mL bupivacaine 0.5 % Outcome: tolerated well, no immediate complications  There was excellent flow of contrast producing a partial arthrogram of the hip. The patient did have relief of symptoms during the anesthetic phase of the injection. Procedure, treatment alternatives, risks and benefits explained, specific risks discussed. Consent was given by the patient. Immediately prior to procedure a time out was called to verify the correct patient, procedure, equipment, support staff and site/side marked as required. Patient was prepped and draped in the usual sterile fashion.      No notes on file   Clinical History: No specialty comments available.  She reports that  has never smoked. she has never used smokeless tobacco.  Recent Labs    12/23/16 1141 06/24/17 1134  HGBA1C 6.6* 6.7*    Objective:  VS:  HT:    WT:   BMI:     BP:   HR: bpm  TEMP: ( )  RESP:  Physical Exam  Ortho Exam Imaging: No results found.  Past  Medical/Family/Surgical/Social History: Medications & Allergies reviewed per EMR Patient Active Problem List   Diagnosis Date Noted  . Anemia 07/01/2017  . Female bladder prolapse 06/30/2015  . Annual physical exam 05/12/2014  . Lipoma of abdominal wall 01/24/2013  . Sciatica of right side 04/26/2012  . ALLERGIC RHINITIS, SEASONAL 04/13/2009  . Type 2 diabetes with nephropathy ([place]) 01/10/2008  . Hyperlipidemia LDL goal <100 01/10/2008  . Malignant hypertension 01/10/2008   Past Medical History:  Diagnosis Date  . Diabetes mellitus type II   . Hyperlipidemia   . Hypertension    Family History  Problem Relation Age of Onset  . Pneumonia Brother        on continuous O2  . Stroke Brother   . Stroke Sister   . COPD Sister   . Diabetes Sister   . Stroke Sister   . Hypertension Mother   . Diabetes Mother   . Fibroids Daughter        uterine   Past Surgical History:  Procedure Laterality Date  . ABDOMINAL HYSTERECTOMY    . COLONOSCOPY N/A 06/13/2015   Procedure: COLONOSCOPY;  Surgeon: Rogene Houston, MD;  Location: [facility];  Service: Endoscopy;  Laterality: N/A;  930-rescheduled 8/26 @ 8:30am Ann to notify pt  . HERNIA REPAIR    . inguinal herniorrhapy right    . right eye surgery secondary to right eye weakness    . SALPINGOOPHORECTOMY    . TUBAL LIGATION  Social History   Occupational History  . Not on file  Tobacco Use  . Smoking status: Never Smoker  . Smokeless tobacco: Never Used  Substance and Sexual Activity  . Alcohol use: No  . Drug use: No  . Sexual activity: Yes    Birth control/protection: Surgical

## 2017-09-21 ENCOUNTER — Encounter (INDEPENDENT_AMBULATORY_CARE_PROVIDER_SITE_OTHER): Payer: Self-pay | Admitting: Physical Medicine and Rehabilitation

## 2017-09-21 DIAGNOSIS — M25551 Pain in right hip: Secondary | ICD-10-CM | POA: Diagnosis not present

## 2017-09-21 DIAGNOSIS — M48062 Spinal stenosis, lumbar region with neurogenic claudication: Secondary | ICD-10-CM | POA: Diagnosis not present

## 2017-09-21 DIAGNOSIS — M5416 Radiculopathy, lumbar region: Secondary | ICD-10-CM | POA: Diagnosis not present

## 2017-09-21 DIAGNOSIS — M47816 Spondylosis without myelopathy or radiculopathy, lumbar region: Secondary | ICD-10-CM | POA: Diagnosis not present

## 2017-09-21 MED ORDER — BUPIVACAINE HCL 0.5 % IJ SOLN
3.0000 mL | INTRAMUSCULAR | Status: AC | PRN
  Administered 2017-09-21: 3 mL via INTRA_ARTICULAR

## 2017-09-21 MED ORDER — TRIAMCINOLONE ACETONIDE 40 MG/ML IJ SUSP
80.0000 mg | INTRAMUSCULAR | Status: AC | PRN
  Administered 2017-09-21: 80 mg via INTRA_ARTICULAR

## 2017-09-21 NOTE — Progress Notes (Signed)
Mikayla Jenkins - 71 y.o. female MRN [id number]  Date of birth: April 09, 1946  Office Visit Note: Visit Date: 09/20/2017 PCP: Fayrene Helper, MD Referred by: Fayrene Helper, MD  Subjective: Chief Complaint  Patient presents with  . Lower Back - Pain  . Right Leg - Pain  . Right Hip - Pain   HPI: Mikayla Jenkins is a 71 year old female referred here by her primary care physician Dr. Tula Nakayama.  The patient is present with 1 of her daughters and then later on in the interview we did meet her other daughter who is a TEFL teacher.  She comes in today with worsening right hip and leg pain.  She has a history of similar complaints many years ago in fact has an MRI of the lumbar spine from 2013.  This was reviewed again with him today.  At the time she had epidural injection that gave her quite a bit of relief and I think she had this on 2 occasions may be a year or so later.  MRI findings were consistent with lumbar spondylosis and facet arthropathy with moderate stenosis at L4-5.  She reports no specific injury just worsening right hip and leg pain over the last several months.  Is progressively worsened despite use of medications and activity modification.  She is not had any recent injections.  She has not had recent physical therapy.  She denies any numbness tingling or paresthesia.  Her pain is really located anterior laterally the upper hip region and will refer down to the knee anterolaterally.  She has no posterior pain.  No left-sided complaints.  Pain is worse with standing and ambulating and movement.  She gets some pain getting out of the car.  She has been walking with a limp to that side as well.  She is not noted any focal weakness or bowel or bladder issues.  She has not had any unintended weight loss.  No fevers chills or night sweats.  She has never had her hips evaluated her x-rays of her hips that she knows of.  We did check the imaging system and did not see any images  of the pelvis or hips.  She has had no other real orthopedic complaints.  Her case is complicated with diabetes which seems to be under decent control.    Review of Systems  Constitutional: Negative for chills, fever, malaise/fatigue and weight loss.  HENT: Negative for hearing loss and sinus pain.   Eyes: Negative for blurred vision, double vision and photophobia.  Respiratory: Negative for cough and shortness of breath.   Cardiovascular: Negative for chest pain, palpitations and leg swelling.  Gastrointestinal: Negative for abdominal pain, nausea and vomiting.  Genitourinary: Negative for flank pain.  Musculoskeletal: Positive for back pain and joint pain. Negative for myalgias.  Skin: Negative for itching and rash.  Neurological: Negative for tremors, focal weakness and weakness.  Endo/Heme/Allergies: Negative.   Psychiatric/Behavioral: Negative for depression.  All other systems reviewed and are negative.  Otherwise per HPI.  Assessment & Plan: Visit Diagnoses:  1. Pain of right hip joint   2. Unilateral primary osteoarthritis, right hip   3. Lumbar radiculopathy   4. Spinal stenosis of lumbar region with neurogenic claudication   5. Spondylosis without myelopathy or radiculopathy, lumbar region     Plan: Findings:  Chronic history of back pain with radicular pain some years ago that was relieved with epidural injection at least on 2 occasions.  MRI from 2013  shows facet arthropathy at multiple levels with moderate multifactorial stenosis at L4-5.  However, today on evaluation she was having more pain in the right hip and groin anteriorly which would refer down to the knee without numbness or tingling.  This pain was worse with standing and ambulating and getting out of the car.  This seemed to be very consistent with a hip pathology.  We did obtain x-rays of the pelvis and hip and it does show significant hip arthritis on the right.  Ended up completing a diagnostic intra-articular  hip injection with fluoroscopic guidance found relief during the anesthetic phase.  We also reviewed her MRI with her and talked about epidural injections and the differences in symptoms at times.  They can mimic each other obviously.  At this point were going to see how she does with the injection.  We talked with her and both daughters at length about medications as well as injections for treatment of both conditions.  Depending on the length of time that she receives relief with the hip injection we could obviously repeat this at times if it were intermittent.  There is no long-term issue with this as long as they are very infrequent.  Alternatively if it is short-lived but does very well she may wish to consider seeing Dr. Ninfa Linden in our office for management and evaluation of her hip.  In terms of her back she may have this flareup at some time and I think it would be just a different quality and character of pain.  She obviously probably would do well with epidural injection.  At her age level and level of stenosis a few years ago it could have worsened to a degree look at repeating an MRI at some point if that should return.  She would likely do well with physical therapy depending on results as well.  She will continue with current medications.  Greater than 50% of this visit (total duration of visit was 45 minutes) was spent in counseling and coordination of care discussing above with both daughters.  One daughter is a doctor of pharmacology with specific questions about nerve blocks and anesthetics and I hope we can answer those questions adequately.    Meds & Orders:  Meds ordered this encounter  Medications  . lidocaine (PF) (XYLOCAINE) 1 % injection 2 mL  . methylPREDNISolone acetate (DEPO-MEDROL) injection 80 mg    Orders Placed This Encounter  Procedures  . Large Joint Inj: R hip joint  . XR C-ARM NO REPORT  . XR HIP UNILAT W OR W/O PELVIS 2-3 VIEWS RIGHT  . Epidural Steroid injection      Follow-up: Return in about 4 weeks (around 10/18/2017).   Procedures: Large Joint Inj: R hip joint on 09/21/2017 6:18 AM Indications: diagnostic evaluation and pain Details: 22 G 3.5 in needle, fluoroscopy-guided anterior approach  Arthrogram: No  Medications: 3 mL bupivacaine 0.5 %; 80 mg triamcinolone acetonide 40 MG/ML Outcome: tolerated well, no immediate complications  There was excellent flow of contrast producing a partial arthrogram of the hip. The patient did have relief of symptoms during the anesthetic phase of the injection. Procedure, treatment alternatives, risks and benefits explained, specific risks discussed. Consent was given by the patient. Immediately prior to procedure a time out was called to verify the correct patient, procedure, equipment, support staff and site/side marked as required. Patient was prepped and draped in the usual sterile fashion.      No notes on file  Clinical History: IMPRESSION: Technically successful first lumbar interlaminar epidural injection at L4-L5 on the RIGHT.   Electronically Signed   By: Dereck Ligas M.D.   On: 05/21/2014 13:13  MRI LUMBAR SPINE WITHOUT CONTRAST  Technique: Multiplanar and multiecho pulse sequences of the lumbar spine were obtained without intravenous contrast.  Comparison: Radiograph 05/03/2012.  Findings: There is a moderate levoconvex lumbar scoliosis better demonstrated on the recent plain films. Five lumbar type vertebral bodies are present. Vertebral body height is preserved. The spinal cord terminates posterior to the L1 vertebra. The paraspinal soft tissues are within normal limits. There is heterogenous marrow with loss of normal fatty marrow signal, which is a nonspecific finding. It is commonly associated with anemia, chronic disease, obesity, cigarette smoking. T10-T11 shows degenerated disc with broad-based bulging. This level is only partially visualized on sagittal images.  T11-T12 appears normal. T12-L1 shows shallow disc bulge.  L1-L2: Left facet arthrosis without stenosis.  L2-L3: Disc desiccation and mild bulging. Mild left facet arthrosis. No stenosis.  L3-L4: Disc desiccation with bulging. Facet arthrosis and mild ligamentum flavum redundancy. Foramina and lateral recesses patent. There is mild flattening of the ventral thecal sac. No compressive stenosis.  L4-L5: Moderate central stenosis is present. Bilateral lateral recess stenosis associated with severe facet arthrosis and central disc protrusion. Probable small sequestered disc fragment in the midline. Grade 1 anterolisthesis of L4 on L5 measures 3 mm. Uncoverage of the disc contributes to stenosis. Left foramen patent. Mild right foraminal stenosis. Bilateral facet effusions are present compatible with active facet arthritis.  L5-S1: Severely degenerated disc. Grade 1 anterolisthesis measuring 5 mm, apparently facet mediated. Central canal and lateral recesses appear patent. Severe bilateral facet arthritis is present. Despite the degenerative changes, the foramina appear adequately patent.  IMPRESSION:  1. L4-L5 moderate central stenosis associated with anterolisthesis, central disc protrusion with probable small sequestered fragment and severe facet arthritis. Lateral recess stenosis potentially affects both descending L5 nerves. 2. L5-S1 severe degenerative disc and facet disease without stenosis. Bilateral L5-S1 facet arthritis. 3. Moderate levoconvex lumbar scoliosis with degenerative facet disease at other levels.  She reports that  has never smoked. she has never used smokeless tobacco.  Recent Labs    12/23/16 1141 06/24/17 1134  HGBA1C 6.6* 6.7*    Objective:  VS:  HT:    WT:   BMI:     BP:(!) 153/74  HR:60bpm  TEMP:98.1 F (36.7 C)( )  RESP:98 % Physical Exam  Constitutional: She is oriented to person, place, and time. She appears well-developed and  well-nourished. No distress.  HENT:  Head: Normocephalic and atraumatic.  Nose: Nose normal.  Mouth/Throat: Oropharynx is clear and moist.  Eyes: Conjunctivae are normal. Pupils are equal, round, and reactive to light.  Neck: Normal range of motion. Neck supple.  Cardiovascular: Normal rate and intact distal pulses.  Pulmonary/Chest: Effort normal. No respiratory distress.  Abdominal: She exhibits no distension. There is no guarding.  Musculoskeletal:  Patient is slow to rise from a seated position.  She does have some pain with extension of the lumbar spine.  Rotation of the left hip shows free moving left hip and internal and external rotation.  Rotating the right hip shows very stiff internal rotation with pain in the groin at end range.  With hip flexion and external rotation she gets pretty significant pain in the area that has been bothering her and this does reproduce her pain.  She has good distal strength without clonus.  No real pain  over the greater trochanters.  Neurological: She is alert and oriented to person, place, and time. She exhibits normal muscle tone. Coordination normal.  Skin: Skin is warm. No rash noted. No erythema.  Psychiatric: She has a normal mood and affect. Her behavior is normal.  Nursing note and vitals reviewed.   Ortho Exam Imaging: Xr C-arm No Report  Result Date: 09/20/2017 Please see Notes or Procedures tab for imaging impression.  Xr Hip Unilat W Or W/o Pelvis 2-3 Views Right  Result Date: 09/21/2017 Standing [facility] pelvis and right hip x-rays show severe osteoarthritis of the right hip with only mild findings on the left.  There is no fractures or dislocations.  Sacroiliac joints are well maintained without sclerosis.  No other real discrete findings   Past Medical/Family/Surgical/Social History: Medications & Allergies reviewed per EMR Patient Active Problem List   Diagnosis Date Noted  . Anemia 07/01/2017  . Female bladder prolapse 06/30/2015    . Annual physical exam 05/12/2014  . Lipoma of abdominal wall 01/24/2013  . Sciatica of right side 04/26/2012  . ALLERGIC RHINITIS, SEASONAL 04/13/2009  . Type 2 diabetes with nephropathy ([place]) 01/10/2008  . Hyperlipidemia LDL goal <100 01/10/2008  . Malignant hypertension 01/10/2008   Past Medical History:  Diagnosis Date  . Diabetes mellitus type II   . Hyperlipidemia   . Hypertension    Family History  Problem Relation Age of Onset  . Pneumonia Brother        on continuous O2  . Stroke Brother   . Stroke Sister   . COPD Sister   . Diabetes Sister   . Stroke Sister   . Hypertension Mother   . Diabetes Mother   . Fibroids Daughter        uterine   Past Surgical History:  Procedure Laterality Date  . ABDOMINAL HYSTERECTOMY    . COLONOSCOPY N/A 06/13/2015   Procedure: COLONOSCOPY;  Surgeon: Rogene Houston, MD;  Location: [facility];  Service: Endoscopy;  Laterality: N/A;  930-rescheduled 8/26 @ 8:30am Ann to notify pt  . HERNIA REPAIR    . inguinal herniorrhapy right    . right eye surgery secondary to right eye weakness    . SALPINGOOPHORECTOMY    . TUBAL LIGATION     Social History   Occupational History  . Not on file  Tobacco Use  . Smoking status: Never Smoker  . Smokeless tobacco: Never Used  Substance and Sexual Activity  . Alcohol use: No  . Drug use: No  . Sexual activity: Yes    Birth control/protection: Surgical

## 2017-10-24 ENCOUNTER — Ambulatory Visit: Payer: Medicare HMO | Admitting: Cardiovascular Disease

## 2017-11-01 ENCOUNTER — Other Ambulatory Visit (HOSPITAL_COMMUNITY)
Admission: RE | Admit: 2017-11-01 | Discharge: 2017-11-01 | Disposition: A | Discharge status: Home / Self Care | Payer: Medicare HMO | Source: Ambulatory Visit | Attending: Cardiovascular Disease | Admitting: Cardiovascular Disease

## 2017-11-01 ENCOUNTER — Encounter: Payer: Self-pay | Admitting: Cardiovascular Disease

## 2017-11-01 ENCOUNTER — Ambulatory Visit: Payer: Medicare HMO | Admitting: Cardiovascular Disease

## 2017-11-01 VITALS — BP 140/76 | HR 63 | Ht 67.0 in | Wt 150.0 lb

## 2017-11-01 DIAGNOSIS — D649 Anemia, unspecified: Secondary | ICD-10-CM

## 2017-11-01 DIAGNOSIS — E785 Hyperlipidemia, unspecified: Secondary | ICD-10-CM

## 2017-11-01 DIAGNOSIS — I10 Essential (primary) hypertension: Secondary | ICD-10-CM | POA: Diagnosis not present

## 2017-11-01 DIAGNOSIS — E119 Type 2 diabetes mellitus without complications: Secondary | ICD-10-CM

## 2017-11-01 LAB — CBC
HCT: 38.7 % (ref 36.0–46.0)
Hemoglobin: 12.7 g/dL (ref 12.0–15.0)
MCH: 31 pg (ref 26.0–34.0)
MCHC: 32.8 g/dL (ref 30.0–36.0)
MCV: 94.4 fL (ref 78.0–100.0)
PLATELETS: 264 10*3/uL (ref 150–400)
RBC: 4.1 MIL/uL (ref 3.87–5.11)
RDW: 12.8 % (ref 11.5–15.5)
WBC: 7.5 10*3/uL (ref 4.0–10.5)

## 2017-11-01 LAB — BASIC METABOLIC PANEL
ANION GAP: 13 (ref 5–15)
BUN: 26 mg/dL — ABNORMAL HIGH (ref 6–20)
CALCIUM: 10.2 mg/dL (ref 8.9–10.3)
CHLORIDE: 96 mmol/L — AB (ref 101–111)
CO2: 25 mmol/L (ref 22–32)
Creatinine, Ser: 1.11 mg/dL — ABNORMAL HIGH (ref 0.44–1.00)
GFR calc Af Amer: 56 mL/min — ABNORMAL LOW (ref >60)
GFR calc non Af Amer: 48 mL/min — ABNORMAL LOW (ref >60)
GLUCOSE: 151 mg/dL — AB (ref 65–99)
POTASSIUM: 3.9 mmol/L (ref 3.5–5.1)
Sodium: 134 mmol/L — ABNORMAL LOW (ref 135–145)

## 2017-11-01 NOTE — Progress Notes (Signed)
SUBJECTIVE: The patient presents for follow-up of malignant hypertension.  Echocardiogram on 09/06/17 demonstrated normal left ventricular systolic function, LVEF 66-29%, mild LVH, and grade 1 diastolic dysfunction.  There is no evidence of renal artery stenosis by renal arterial Dopplers on 09/14/17.  She is here today with her daughter from [place] who is a doctor of pharmacy and works for a [employer] in [place] that makes oncology pharmaceuticals.  She does a lot of patient education.  The patient denies any symptoms of chest pain, palpitations, shortness of breath, lightheadedness, dizziness, leg swelling, orthopnea, PND, and syncope.  She initially experienced calf cramping after starting spironolactone which subsequently resolved.  GFR was 59 mL/min on 09/12/17.  BUN 26, creatinine 1.06, sodium 136, potassium 4.  Her daughter has some concerns about the patient being on 2 diuretics.  We spoke about adequate hydration.   Social History: She is married. She has a son and 2 daughters.  Her younger daughter lives in [place] and is a doctor of pharmacy.   Review of Systems: As per "subjective", otherwise negative.  No Known Allergies  Current Outpatient Medications  Medication Sig Dispense Refill  . amLODipine (NORVASC) 10 MG tablet Take 1 tablet (10 mg total) daily by mouth. 90 tablet 1  . aspirin (ASPIRIN LOW DOSE) 81 MG EC tablet Take 81 mg by mouth daily. Take 1 tablet by mouth once a day     . atenolol-chlorthalidone (TENORETIC 100) 100-25 MG tablet Take 1 tablet by mouth daily. 90 tablet 1  . benazepril (LOTENSIN) 40 MG tablet Take 1 tablet (40 mg total) daily by mouth. 90 tablet 1  . Calcium Carbonate-Vitamin D (CALCIUM 600/VITAMIN D) 600-400 MG-UNIT per tablet Take 1 tablet by mouth daily.    . cloNIDine (CATAPRES) 0.3 MG tablet TAKE 1 AND 1/2 TABLETS AT BEDTIME 135 tablet 1  . HYDROcodone-acetaminophen (NORCO/VICODIN) 5-325 MG tablet One tablet at bedtime , as  needed, for back and right leg pain 30 tablet 0  . lovastatin (MEVACOR) 40 MG tablet Take 1 tablet (40 mg total) daily by mouth. 90 tablet 1  . metFORMIN (GLUCOPHAGE) 850 MG tablet TAKE 1 TABLET EVERY DAY WITH BREAKFAST 90 tablet 1  . Multiple Vitamin (MULTIVITAMIN) tablet Take 1 tablet by mouth daily.    Marland Kitchen spironolactone (ALDACTONE) 25 MG tablet Take 1 tablet (25 mg total) daily by mouth. 90 tablet 3  . TRUE METRIX BLOOD GLUCOSE TEST test strip TEST BLOOD SUGAR ONE TIME DAILY 100 each 1   No current facility-administered medications for this visit.     Past Medical History:  Diagnosis Date  . Diabetes mellitus type II   . Hyperlipidemia   . Hypertension     Past Surgical History:  Procedure Laterality Date  . ABDOMINAL HYSTERECTOMY    . COLONOSCOPY N/A 06/13/2015   Procedure: COLONOSCOPY;  Surgeon: Rogene Houston, MD;  Location: [facility];  Service: Endoscopy;  Laterality: N/A;  930-rescheduled 8/26 @ 8:30am Ann to notify pt  . HERNIA REPAIR    . inguinal herniorrhapy right    . right eye surgery secondary to right eye weakness    . SALPINGOOPHORECTOMY    . TUBAL LIGATION      Social History   Socioeconomic History  . Marital status: Married    Spouse name: Not on file  . Number of children: Not on file  . Years of education: Not on file  . Highest education level: Not on file  Social  Needs  . Financial resource strain: Not on file  . Food insecurity - worry: Not on file  . Food insecurity - inability: Not on file  . Transportation needs - medical: Not on file  . Transportation needs - non-medical: Not on file  Occupational History  . Not on file  Tobacco Use  . Smoking status: Never Smoker  . Smokeless tobacco: Never Used  Substance and Sexual Activity  . Alcohol use: No  . Drug use: No  . Sexual activity: Yes    Birth control/protection: Surgical  Other Topics Concern  . Not on file  Social History Narrative  . Not on file     Vitals:   11/01/17  1354  BP: 140/76  Pulse: 63  SpO2: 97%  Weight: 150 lb (68 kg)  Height: 5\' 7"  (1.702 m)    Wt Readings from Last 3 Encounters:  11/01/17 150 lb (68 kg)  09/05/17 151 lb (68.5 kg)  08/29/17 151 lb 12.8 oz (68.9 kg)     PHYSICAL EXAM General: NAD HEENT: Normal. Neck: No JVD, no thyromegaly. Lungs: Clear to auscultation bilaterally with normal respiratory effort. CV: Regular rate and rhythm, normal S1/S2, no S3/S4, no murmur. No pretibial or periankle edema.  No carotid bruit.   Abdomen: Soft, nontender, no distention.  Neurologic: Alert and oriented.  Psych: Normal affect. Skin: Normal. Musculoskeletal: No gross deformities.    ECG: Most recent ECG reviewed.   Labs: Lab Results  Component Value Date/Time   K 4.0 09/12/2017 11:42 AM   BUN 26 (H) 09/12/2017 11:42 AM   CREATININE 1.06 (H) 09/12/2017 11:42 AM   CREATININE 1.13 (H) 08/22/2017 02:26 PM   ALT 13 08/22/2017 02:26 PM   TSH 1.08 06/24/2017 11:34 AM   HGB 12.6 08/22/2017 02:26 PM     Lipids: Lab Results  Component Value Date/Time   LDLCALC 59 12/23/2016 11:41 AM   CHOL 200 (H) 06/24/2017 11:34 AM   TRIG 65 06/24/2017 11:34 AM   HDL 115 06/24/2017 11:34 AM       ASSESSMENT AND PLAN:  1.  Malignant hypertension: Blood pressure is now under much better control after the addition of spironolactone 25 mg daily.  I will check a basic metabolic panel to assess renal function.  I will check a CBC at the request of her daughter. I suspect blood pressure is also better controlled after she received an epidural injection for right leg sciatica. Left ventricular systolic function is normal with grade 1 diastolic dysfunction as detailed above. She will be maintained on amlodipine 10 mg, atenolol 100 mg, chlorthalidone 25 mg, benazepril 40 mg, clonidine 0.3 mg every afternoon, and spironolactone 25 mg daily.  2.  Dyslipidemia: Continue statin therapy.  3.  Type 2 diabetes mellitus: Currently on metformin.  4.   Anemia: Hemoglobin 12.6 on 08/22/17.  It had been 11.6 about 4 months ago.  Her daughter requests a repeat CBC which I will order.   Disposition: Follow up 3-4 months   Kate Sable, M.D., F.A.C.C.

## 2017-11-01 NOTE — Patient Instructions (Signed)
Your physician wants you to follow-up in:3-4 months with Dr.Koneswaran  Your physician recommends that you continue on your current medications as directed. Please refer to the Current Medication list given to you today.   If you need a refill on your cardiac medications before your next appointment, please call your pharmacy.   Please get lab work :CBC,BMET    No tests ordered today.      Thank you for choosing [facility] !

## 2017-11-29 ENCOUNTER — Other Ambulatory Visit: Payer: Self-pay | Admitting: Family Medicine

## 2017-12-01 ENCOUNTER — Other Ambulatory Visit: Payer: Self-pay

## 2017-12-01 MED ORDER — BENAZEPRIL HCL 40 MG PO TABS: 40.0000 mg | ORAL_TABLET | Freq: Every day | ORAL | 1 refills | Status: DC

## 2018-01-05 DIAGNOSIS — E118 Type 2 diabetes mellitus with unspecified complications: Secondary | ICD-10-CM | POA: Diagnosis not present

## 2018-01-05 DIAGNOSIS — E785 Hyperlipidemia, unspecified: Secondary | ICD-10-CM | POA: Diagnosis not present

## 2018-01-06 LAB — HEMOGLOBIN A1C
Hgb A1c MFr Bld: 6.9 % of total Hgb — ABNORMAL HIGH (ref <5.7)
Mean Plasma Glucose: 151 (calc)
eAG (mmol/L): 8.4 (calc)

## 2018-01-06 LAB — COMPLETE METABOLIC PANEL WITH GFR
AG Ratio: 1.8 (calc) (ref 1.0–2.5)
ALBUMIN MSPROF: 4.5 g/dL (ref 3.6–5.1)
ALKALINE PHOSPHATASE (APISO): 80 U/L (ref 33–130)
ALT: 12 U/L (ref 6–29)
AST: 20 U/L (ref 10–35)
BILIRUBIN TOTAL: 0.7 mg/dL (ref 0.2–1.2)
BUN / CREAT RATIO: 19 (calc) (ref 6–22)
BUN: 20 mg/dL (ref 7–25)
CO2: 28 mmol/L (ref 20–32)
CREATININE: 1.05 mg/dL — AB (ref 0.60–0.93)
Calcium: 9.9 mg/dL (ref 8.6–10.4)
Chloride: 102 mmol/L (ref 98–110)
GFR, Est African American: 61 mL/min/{1.73_m2} (ref >=60)
GFR, Est Non African American: 53 mL/min/{1.73_m2} — ABNORMAL LOW (ref >=60)
GLOBULIN: 2.5 g/dL (ref 1.9–3.7)
Glucose, Bld: 136 mg/dL — ABNORMAL HIGH (ref 65–99)
Potassium: 4.2 mmol/L (ref 3.5–5.3)
SODIUM: 139 mmol/L (ref 135–146)
Total Protein: 7 g/dL (ref 6.1–8.1)

## 2018-01-06 LAB — LIPID PANEL
Cholesterol: 183 mg/dL (ref <200)
HDL: 90 mg/dL (ref >50)
LDL Cholesterol (Calc): 80 mg/dL (calc)
NON-HDL CHOLESTEROL (CALC): 93 mg/dL (ref <130)
TRIGLYCERIDES: 58 mg/dL (ref <150)
Total CHOL/HDL Ratio: 2 (calc) (ref <5.0)

## 2018-01-09 ENCOUNTER — Other Ambulatory Visit: Payer: Self-pay

## 2018-01-09 ENCOUNTER — Encounter: Payer: Self-pay | Admitting: Family Medicine

## 2018-01-09 ENCOUNTER — Ambulatory Visit (INDEPENDENT_AMBULATORY_CARE_PROVIDER_SITE_OTHER): Payer: Medicare HMO | Admitting: Family Medicine

## 2018-01-09 VITALS — BP 126/68 | HR 57 | Ht 67.5 in | Wt 147.1 lb

## 2018-01-09 DIAGNOSIS — E1121 Type 2 diabetes mellitus with diabetic nephropathy: Secondary | ICD-10-CM

## 2018-01-09 DIAGNOSIS — E785 Hyperlipidemia, unspecified: Secondary | ICD-10-CM

## 2018-01-09 DIAGNOSIS — I10 Essential (primary) hypertension: Secondary | ICD-10-CM | POA: Diagnosis not present

## 2018-01-09 DIAGNOSIS — M5431 Sciatica, right side: Secondary | ICD-10-CM

## 2018-01-09 MED ORDER — METFORMIN HCL ER 500 MG PO TB24: 500.0000 mg | ORAL_TABLET | Freq: Every day | ORAL | 3 refills | Status: DC

## 2018-01-09 MED ORDER — ATENOLOL-CHLORTHALIDONE 100-25 MG PO TABS: 1.0000 | ORAL_TABLET | Freq: Every day | ORAL | 1 refills | Status: DC

## 2018-01-09 MED ORDER — LOVASTATIN 40 MG PO TABS: 40.0000 mg | ORAL_TABLET | Freq: Every day | ORAL | 1 refills | Status: DC

## 2018-01-09 NOTE — Patient Instructions (Addendum)
Wellness with nurse end July or mid August  Physical with mD in end October , call if you need me before\  Excellent labs and BP !!!!  Reduce metformin to 500 mg one daily , this is being sent today  Non fast cBC, chem 7 and EGFR and HBA1C 1 week before wellness visit Thanks for choosing [facility], we consider it a privelige to serve you.

## 2018-01-09 NOTE — Assessment & Plan Note (Addendum)
Over corrected with weight losss , reduce metformin dose Mikayla Jenkins is reminded of the importance of commitment to daily physical activity for 30 minutes or more, as able and the need to limit carbohydrate intake to 30 to 60 grams per meal to help with blood sugar control.   The need to take medication as prescribed, test blood sugar as directed, and to call between visits if there is a concern that blood sugar is uncontrolled is also discussed.   Mikayla Jenkins is reminded of the importance of daily foot exam, annual eye examination, and good blood sugar, blood pressure and cholesterol control.  Diabetic Labs Latest Ref Rng & Units 01/05/2018 11/01/2017 09/12/2017 08/22/2017 06/24/2017  HbA1c <5.7 % of total Hgb 6.9(H) - - - 6.7(H)  Microalbumin Not estab mg/dL - - - - -  Micro/Creat Ratio <30 mcg/mg creat - - - - -  Chol <200 mg/dL 183 - - - 200(H)  HDL >50 mg/dL 90 - - - 115  Calc LDL mg/dL (calc) 80 - - - 71  Triglycerides <150 mg/dL 58 - - - 65  Creatinine 0.60 - 0.93 mg/dL 1.05(H) 1.11(H) 1.06(H) 1.13(H) 0.88   BP/Weight 01/09/2018 11/01/2017 09/20/2017 09/05/2017 08/29/2017 08/02/2017 7/70/3403  Systolic BP 524 818 590 931 121 624 -  Diastolic BP 68 76 74 86 82 80 -  Wt. (Lbs) 147.12 150 - 151 151.8 - 153.5  BMI 22.7 23.49 - 23.65 23.78 - 24.04   Foot/eye exam completion dates Latest Ref Rng & Units 08/29/2017 08/26/2016  Eye Exam No Retinopathy - -  Foot Form Completion - Done Done

## 2018-01-15 ENCOUNTER — Encounter: Payer: Self-pay | Admitting: Family Medicine

## 2018-01-15 NOTE — Assessment & Plan Note (Signed)
Hyperlipidemia:Low fat diet discussed and encouraged.   Lipid Panel  Lab Results  Component Value Date   CHOL 183 01/05/2018   HDL 90 01/05/2018   LDLCALC 80 01/05/2018   TRIG 58 01/05/2018   CHOLHDL 2.0 01/05/2018   Controlled, no change in medication

## 2018-01-15 NOTE — Assessment & Plan Note (Signed)
No current or recent flare

## 2018-01-15 NOTE — Progress Notes (Signed)
Mikayla Jenkins     MRN: [id number]      DOB: 22-Nov-1945   HPI [name] is here for follow up and re-evaluation of chronic medical conditions, medication management and review of any available recent lab and radiology data.  Preventive health is updated, specifically  Cancer screening and Immunization.   Questions or concerns regarding consultations or procedures which the PT has had in the interim are  addressed. The PT denies any adverse reactions to current medications since the last visit.  C/o weight loss , states she needs to eat more and intends to do so Denies polyuria, polydipsia, blurred vision , or hypoglycemic episodes.   ROS Denies recent fever or chills. Denies sinus pressure, nasal congestion, ear pain or sore throat. Denies chest congestion, productive cough or wheezing. Denies chest pains, palpitations and leg swelling Denies abdominal pain, nausea, vomiting,diarrhea or constipation.   Denies dysuria, frequency, hesitancy or incontinence. Denies uncontrolled joint pain, swelling and limitation in mobility. Denies headaches, seizures, numbness, or tingling. Denies depression, anxiety or insomnia. Denies skin break down or rash.   PE  BP 126/68   Pulse (!) 57   Ht 5' 7.5" (1.715 m)   Wt 147 lb 1.9 oz (66.7 kg)   SpO2 99%   BMI 22.70 kg/m   Patient alert and oriented and in no cardiopulmonary distress.  HEENT: No facial asymmetry, EOMI,   oropharynx pink and moist.  Neck supple no JVD, no mass.  Chest: Clear to auscultation bilaterally.  CVS: S1, S2 no murmurs, no S3.Regular rate.  ABD: Soft non tender.   Ext: No edema  MS: Adequate though reduced  ROM spine, shoulders, hips and knees.  Skin: Intact, no ulcerations or rash noted.  Psych: Good eye contact, normal affect. Memory intact not anxious or depressed appearing.  CNS: CN 2-12 intact, power,  normal throughout.no focal deficits noted.   Assessment & Plan  Type 2 diabetes with nephropathy  ([place]) Over corrected with weight losss , reduce metformin dose [name] is reminded of the importance of commitment to daily physical activity for 30 minutes or more, as able and the need to limit carbohydrate intake to 30 to 60 grams per meal to help with blood sugar control.   The need to take medication as prescribed, test blood sugar as directed, and to call between visits if there is a concern that blood sugar is uncontrolled is also discussed.   [name] is reminded of the importance of daily foot exam, annual eye examination, and good blood sugar, blood pressure and cholesterol control.  Diabetic Labs Latest Ref Rng & Units 01/05/2018 11/01/2017 09/12/2017 08/22/2017 06/24/2017  HbA1c <5.7 % of total Hgb 6.9(H) - - - 6.7(H)  Microalbumin Not estab mg/dL - - - - -  Micro/Creat Ratio <30 mcg/mg creat - - - - -  Chol <200 mg/dL 183 - - - 200(H)  HDL >50 mg/dL 90 - - - 115  Calc LDL mg/dL (calc) 80 - - - 71  Triglycerides <150 mg/dL 58 - - - 65  Creatinine 0.60 - 0.93 mg/dL 1.05(H) 1.11(H) 1.06(H) 1.13(H) 0.88   BP/Weight 01/09/2018 11/01/2017 09/20/2017 09/05/2017 08/29/2017 08/02/2017 9/48/5462  Systolic BP 703 500 938 182 993 716 -  Diastolic BP 68 76 74 86 82 80 -  Wt. (Lbs) 147.12 150 - 151 151.8 - 153.5  BMI 22.7 23.49 - 23.65 23.78 - 24.04   Foot/eye exam completion dates Latest Ref Rng & Units 08/29/2017 08/26/2016  Eye Exam No Retinopathy - -  Foot Form Completion - Done Done        Malignant hypertension Controlled, no change in medication DASH diet and commitment to daily physical activity for a minimum of 30 minutes discussed and encouraged, as a part of hypertension management. The importance of attaining a healthy weight is also discussed.  BP/Weight 01/09/2018 11/01/2017 09/20/2017 09/05/2017 08/29/2017 08/02/2017 8/32/5498  Systolic BP 264 158 309 407 680 881 -  Diastolic BP 68 76 74 86 82 80 -  Wt. (Lbs) 147.12 150 - 151 151.8 - 153.5  BMI 22.7 23.49 - 23.65 23.78  - 24.04       Hyperlipidemia LDL goal <100 Hyperlipidemia:Low fat diet discussed and encouraged.   Lipid Panel  Lab Results  Component Value Date   CHOL 183 01/05/2018   HDL 90 01/05/2018   LDLCALC 80 01/05/2018   TRIG 58 01/05/2018   CHOLHDL 2.0 01/05/2018   Controlled, no change in medication    Sciatica of right side No current or recent flare

## 2018-01-15 NOTE — Assessment & Plan Note (Signed)
Controlled, no change in medication DASH diet and commitment to daily physical activity for a minimum of 30 minutes discussed and encouraged, as a part of hypertension management. The importance of attaining a healthy weight is also discussed.  BP/Weight 01/09/2018 11/01/2017 09/20/2017 09/05/2017 08/29/2017 08/02/2017 5/97/4163  Systolic BP 845 364 680 321 224 825 -  Diastolic BP 68 76 74 86 82 80 -  Wt. (Lbs) 147.12 150 - 151 151.8 - 153.5  BMI 22.7 23.49 - 23.65 23.78 - 24.04

## 2018-02-22 ENCOUNTER — Other Ambulatory Visit: Payer: Self-pay | Admitting: Family Medicine

## 2018-03-01 ENCOUNTER — Encounter: Payer: Self-pay | Admitting: Cardiovascular Disease

## 2018-03-01 ENCOUNTER — Ambulatory Visit: Payer: Medicare HMO | Admitting: Cardiovascular Disease

## 2018-03-01 VITALS — BP 144/84 | HR 57 | Ht 67.0 in | Wt 151.0 lb

## 2018-03-01 DIAGNOSIS — E119 Type 2 diabetes mellitus without complications: Secondary | ICD-10-CM | POA: Diagnosis not present

## 2018-03-01 DIAGNOSIS — D649 Anemia, unspecified: Secondary | ICD-10-CM | POA: Diagnosis not present

## 2018-03-01 DIAGNOSIS — I10 Essential (primary) hypertension: Secondary | ICD-10-CM

## 2018-03-01 DIAGNOSIS — E785 Hyperlipidemia, unspecified: Secondary | ICD-10-CM

## 2018-03-01 NOTE — Progress Notes (Signed)
SUBJECTIVE: The patient presents for follow-up of resistant hypertension.  Echocardiogram on 09/06/17 demonstrated normal left ventricular systolic function, LVEF 08-65%, mild LVH, and grade 1 diastolic dysfunction.  There was no evidence of renal artery stenosis by renal arterial Dopplers on 09/14/17.  She is here today with her daughter from [place] who is a doctor of pharmacy and works for a [employer] in [place] that makes oncology pharmaceuticals.  She does a lot of patient education.  The patient denies chest pain, palpitations, leg swelling, dizziness, and shortness of breath.  Primary complaints relate to left hip pain and lower back pain with sciatica.     Social History: She is married.She has a son and 2 daughters. Her younger daughter lives in [place] and is a doctor of pharmacy and works for a [employer] in [place] that makes oncology pharmaceuticals. The patient has 7 grandchildren altogether.      Review of Systems: As per "subjective", otherwise negative.  No Known Allergies  Current Outpatient Medications  Medication Sig Dispense Refill  . amLODipine (NORVASC) 10 MG tablet TAKE 1 TABLET EVERY DAY 90 tablet 1  . aspirin (ASPIRIN LOW DOSE) 81 MG EC tablet Take 81 mg by mouth daily. Take 1 tablet by mouth once a day     . atenolol-chlorthalidone (TENORETIC 100) 100-25 MG tablet Take 1 tablet by mouth daily. 90 tablet 1  . benazepril (LOTENSIN) 40 MG tablet Take 1 tablet (40 mg total) by mouth daily. 90 tablet 1  . Calcium Carbonate-Vitamin D (CALCIUM 600/VITAMIN D) 600-400 MG-UNIT per tablet Take 1 tablet by mouth daily.    . cloNIDine (CATAPRES) 0.3 MG tablet TAKE 1 AND 1/2 TABLETS AT BEDTIME 135 tablet 1  . HYDROcodone-acetaminophen (NORCO/VICODIN) 5-325 MG tablet One tablet at bedtime , as needed, for back and right leg pain 30 tablet 0  . lovastatin (MEVACOR) 40 MG tablet Take 1 tablet (40 mg total) by mouth daily. 90 tablet 1  . metFORMIN  (GLUCOPHAGE-XR) 500 MG 24 hr tablet Take 1 tablet (500 mg total) by mouth daily with breakfast. 90 tablet 3  . Multiple Vitamin (MULTIVITAMIN) tablet Take 1 tablet by mouth daily.    Marland Kitchen spironolactone (ALDACTONE) 25 MG tablet Take 25 mg by mouth daily.    . TRUE METRIX BLOOD GLUCOSE TEST test strip TEST BLOOD SUGAR ONE TIME DAILY 100 each 1   No current facility-administered medications for this visit.     Past Medical History:  Diagnosis Date  . Diabetes mellitus type II   . Hyperlipidemia   . Hypertension     Past Surgical History:  Procedure Laterality Date  . ABDOMINAL HYSTERECTOMY    . COLONOSCOPY N/A 06/13/2015   Procedure: COLONOSCOPY;  Surgeon: Rogene Houston, MD;  Location[facility];  Service: Endoscopy;  Laterality: N/A;  930-rescheduled 8/26 @ 8:30am Ann to notify pt  . HERNIA REPAIR    . inguinal herniorrhapy right    . right eye surgery secondary to right eye weakness    . SALPINGOOPHORECTOMY    . TUBAL LIGATION      Social History   Socioeconomic History  . Marital status: Married    Spouse name: Not on file  . Number of children: Not on file  . Years of education: Not on file  . Highest education level: Not on file  Occupational History  . Not on file  Social Needs  . Financial resource strain: Not on file  . Food insecurity:  Worry: Not on file    Inability: Not on file  . Transportation needs:    Medical: Not on file    Non-medical: Not on file  Tobacco Use  . Smoking status: Never Smoker  . Smokeless tobacco: Never Used  Substance and Sexual Activity  . Alcohol use: No  . Drug use: No  . Sexual activity: Yes    Birth control/protection: Surgical  Lifestyle  . Physical activity:    Days per week: Not on file    Minutes per session: Not on file  . Stress: Not on file  Relationships  . Social connections:    Talks on phone: Not on file    Gets together: Not on file    Attends religious service: Not on file    Active member of club or  organization: Not on file    Attends meetings of clubs or organizations: Not on file    Relationship status: Not on file  . Intimate partner violence:    Fear of current or ex partner: Not on file    Emotionally abused: Not on file    Physically abused: Not on file    Forced sexual activity: Not on file  Other Topics Concern  . Not on file  Social History Narrative  . Not on file     Vitals:   03/01/18 1053  BP: (!) 144/84  Pulse: (!) 57  SpO2: 99%  Weight: 151 lb (68.5 kg)  Height: 5\' 7"  (1.702 m)    Wt Readings from Last 3 Encounters:  03/01/18 151 lb (68.5 kg)  01/09/18 147 lb 1.9 oz (66.7 kg)  11/01/17 150 lb (68 kg)     PHYSICAL EXAM General: NAD HEENT: Normal. Neck: No JVD, no thyromegaly. Lungs: Clear to auscultation bilaterally with normal respiratory effort. CV: Regular rate and rhythm, normal S1/S2, no S3/S4, no murmur. No pretibial or periankle edema.  No carotid bruit.   Abdomen: Soft, nontender, no distention.  Neurologic: Alert and oriented.  Psych: Normal affect. Skin: Normal. Musculoskeletal: No gross deformities.    ECG: Most recent ECG reviewed.   Labs: Lab Results  Component Value Date/Time   K 4.2 01/05/2018 11:33 AM   BUN 20 01/05/2018 11:33 AM   CREATININE 1.05 (H) 01/05/2018 11:33 AM   ALT 12 01/05/2018 11:33 AM   TSH 1.08 06/24/2017 11:34 AM   HGB 12.7 11/01/2017 02:54 PM     Lipids: Lab Results  Component Value Date/Time   LDLCALC 80 01/05/2018 11:33 AM   CHOL 183 01/05/2018 11:33 AM   TRIG 58 01/05/2018 11:33 AM   HDL 90 01/05/2018 11:33 AM       ASSESSMENT AND PLAN:  1. Resistant hypertension: Blood pressure is mildly elevated today but has been well controlled at home.  Left ventricular systolic function is normal with grade 1 diastolic dysfunction as detailed above. She will be maintained on amlodipine 10 mg, atenolol 100 mg, chlorthalidone 25 mg, benazepril 40 mg, clonidine 0.3 mg every afternoon, and  spironolactone 25 mg daily. GFR 61 on 01/05/2018.  2. Dyslipidemia: Continue statin therapy.  3. Type 2 diabetes mellitus: Currently on metformin.  4.  Anemia: Hemoglobin 12.6 on 08/22/17.    It was 12.7 on 11/01/2017.    Disposition: Follow up 6 months   Kate Sable, M.D., F.A.C.C.

## 2018-03-01 NOTE — Patient Instructions (Signed)

## 2018-03-14 ENCOUNTER — Other Ambulatory Visit: Payer: Self-pay

## 2018-03-14 MED ORDER — SPIRONOLACTONE 25 MG PO TABS: 25.0000 mg | ORAL_TABLET | Freq: Every day | ORAL | 3 refills | Status: DC

## 2018-03-14 NOTE — Telephone Encounter (Signed)
refilled aldactone 25 mg per fax request

## 2018-03-20 ENCOUNTER — Other Ambulatory Visit: Payer: Self-pay | Admitting: Family Medicine

## 2018-03-22 ENCOUNTER — Other Ambulatory Visit: Payer: Self-pay

## 2018-03-22 MED ORDER — SPIRONOLACTONE 25 MG PO TABS: 25.0000 mg | ORAL_TABLET | Freq: Every day | ORAL | 3 refills | Status: DC

## 2018-05-02 ENCOUNTER — Telehealth: Payer: Self-pay | Admitting: Family Medicine

## 2018-05-02 NOTE — Telephone Encounter (Signed)
[place] [phone]  Patient brought by a letter stating that her pharmacy has not heard from [facility] in over a month for the request of accuchek meter & test strips. She says they told her they have requested this several times with no response. She has not checked her blood sugar in over a month. Please advise. Cb#: [phone]

## 2018-05-03 ENCOUNTER — Other Ambulatory Visit: Payer: Self-pay

## 2018-05-03 MED ORDER — GLUCOSE BLOOD VI STRP: ORAL_STRIP | 1 refills | Status: DC

## 2018-05-03 MED ORDER — BLOOD GLUCOSE METER KIT: PACK | 0 refills | Status: DC

## 2018-05-03 NOTE — Telephone Encounter (Signed)
Forwarding to dusty

## 2018-05-03 NOTE — Telephone Encounter (Signed)
I forwarded this message to nurse yesterday for Dr.Simpson to answer, but it was forwarded back to me.

## 2018-05-03 NOTE — Telephone Encounter (Signed)
Script faxed to [employer]

## 2018-05-08 DIAGNOSIS — E1121 Type 2 diabetes mellitus with diabetic nephropathy: Secondary | ICD-10-CM | POA: Diagnosis not present

## 2018-05-09 LAB — BASIC METABOLIC PANEL WITH GFR
BUN/Creatinine Ratio: 22 (calc) (ref 6–22)
BUN: 32 mg/dL — AB (ref 7–25)
CALCIUM: 10.2 mg/dL (ref 8.6–10.4)
CHLORIDE: 101 mmol/L (ref 98–110)
CO2: 28 mmol/L (ref 20–32)
Creat: 1.47 mg/dL — ABNORMAL HIGH (ref 0.60–0.93)
GFR, Est African American: 41 mL/min/{1.73_m2} — ABNORMAL LOW (ref >=60)
GFR, Est Non African American: 35 mL/min/{1.73_m2} — ABNORMAL LOW (ref >=60)
GLUCOSE: 151 mg/dL — AB (ref 65–139)
POTASSIUM: 4.5 mmol/L (ref 3.5–5.3)
Sodium: 136 mmol/L (ref 135–146)

## 2018-05-15 ENCOUNTER — Ambulatory Visit: Payer: Medicare HMO

## 2018-05-15 ENCOUNTER — Telehealth: Payer: Self-pay

## 2018-05-15 DIAGNOSIS — Z780 Asymptomatic menopausal state: Secondary | ICD-10-CM

## 2018-05-15 NOTE — Telephone Encounter (Signed)
Bone density ordered.

## 2018-05-16 ENCOUNTER — Ambulatory Visit (INDEPENDENT_AMBULATORY_CARE_PROVIDER_SITE_OTHER): Payer: Medicare HMO

## 2018-05-16 ENCOUNTER — Other Ambulatory Visit: Payer: Self-pay | Admitting: Family Medicine

## 2018-05-16 ENCOUNTER — Telehealth: Payer: Self-pay

## 2018-05-16 VITALS — BP 136/80 | HR 60 | Resp 16 | Ht 67.0 in | Wt 152.0 lb

## 2018-05-16 DIAGNOSIS — D649 Anemia, unspecified: Secondary | ICD-10-CM | POA: Diagnosis not present

## 2018-05-16 DIAGNOSIS — Z0000 Encounter for general adult medical examination without abnormal findings: Secondary | ICD-10-CM

## 2018-05-16 DIAGNOSIS — E1121 Type 2 diabetes mellitus with diabetic nephropathy: Secondary | ICD-10-CM | POA: Diagnosis not present

## 2018-05-16 DIAGNOSIS — I10 Essential (primary) hypertension: Secondary | ICD-10-CM

## 2018-05-16 MED ORDER — HYDROCODONE-ACETAMINOPHEN 5-325 MG PO TABS: ORAL_TABLET | ORAL | 0 refills | Status: DC

## 2018-05-16 NOTE — Progress Notes (Signed)
Subjective:   Mikayla Jenkins is a 72 y.o. female who presents for Medicare Annual (Subsequent) preventive examination.  Review of Systems:   Cardiac Risk Factors include: advanced age (>31mn, >>43women);diabetes mellitus;dyslipidemia;hypertension     Objective:     Vitals: BP 136/80   Pulse 60   Resp 16   Ht 5' 7"  (1.702 m)   Wt 152 lb (68.9 kg)   SpO2 98%   BMI 23.81 kg/m   Body mass index is 23.81 kg/m.  Advanced Directives 05/16/2018 05/02/2017 06/13/2015  Does Patient Have a Medical Advance Directive? Yes No No  Does patient want to make changes to medical advance directive? Yes (ED - Information included in AVS) - -  Would patient like information on creating a medical advance directive? - Yes (MAU/Ambulatory/Procedural Areas - Information given) No - patient declined information    Tobacco Social History   Tobacco Use  Smoking Status Never Smoker  Smokeless Tobacco Never Used     Counseling given: Not Answered   Clinical Intake:  Pre-visit preparation completed: Yes  Pain : 0-10 Pain Score: 8  Pain Type: Chronic pain Pain Location: Back Pain Orientation: Lower Pain Frequency: Intermittent     Nutritional Status: BMI of 19-24  Normal Diabetes: Yes CBG done?: No Did pt. bring in CBG monitor from home?: No  How often do you need to have someone help you when you read instructions, pamphlets, or other written materials from your doctor or pharmacy?: 1 - Never What is the last grade level you completed in school?: 12th grade   Interpreter Needed?: No  Information entered by :: Mikayla Brines LPN   Past Medical History:  Diagnosis Date  . Diabetes mellitus type II   . Hyperlipidemia   . Hypertension    Past Surgical History:  Procedure Laterality Date  . ABDOMINAL HYSTERECTOMY    . COLONOSCOPY N/A 06/13/2015   Procedure: COLONOSCOPY;  Surgeon: NRogene Houston MD;  [facility];  Service: Endoscopy;  Laterality: N/A;  930-rescheduled 8/26  @ 8:30am Ann to notify pt  . HERNIA REPAIR    . inguinal herniorrhapy right    . right eye surgery secondary to right eye weakness    . SALPINGOOPHORECTOMY    . TUBAL LIGATION     Family History  Problem Relation Age of Onset  . Pneumonia Brother        on continuous O2  . Stroke Brother   . Stroke Sister   . COPD Sister   . Diabetes Sister   . Stroke Sister   . Hypertension Mother   . Diabetes Mother   . Fibroids Daughter        uterine   Social History   Socioeconomic History  . Marital status: Married    Spouse name: Not on file  . Number of children: Not on file  . Years of education: Not on file  . Highest education level: High school graduate  Occupational History  . Not on file  Social Needs  . Financial resource strain: Not hard at all  . Food insecurity:    Worry: Never true    Inability: Never true  . Transportation needs:    Medical: No    Non-medical: No  Tobacco Use  . Smoking status: Never Smoker  . Smokeless tobacco: Never Used  Substance and Sexual Activity  . Alcohol use: No  . Drug use: No  . Sexual activity: Yes    Birth control/protection: Surgical  Lifestyle  . Physical activity:    Days per week: 4 days    Minutes per session: 50 min  . Stress: Not at all  Relationships  . Social connections:    Talks on phone: More than three times a week    Gets together: More than three times a week    Attends religious service: More than 4 times per year    Active member of club or organization: Yes    Attends meetings of clubs or organizations: More than 4 times per year    Relationship status: Married  Other Topics Concern  . Not on file  Social History Narrative  . Not on file    Outpatient Encounter Medications as of 05/16/2018  Medication Sig  . amLODipine (NORVASC) 10 MG tablet TAKE 1 TABLET EVERY DAY  . aspirin (ASPIRIN LOW DOSE) 81 MG EC tablet Take 81 mg by mouth daily. Take 1 tablet by mouth once a day   . atenolol-chlorthalidone  (TENORETIC 100) 100-25 MG tablet Take 1 tablet by mouth daily.  . benazepril (LOTENSIN) 40 MG tablet Take 1 tablet (40 mg total) by mouth daily.  . blood glucose meter kit and supplies Dispense based on patient and insurance preference. Use up to four times daily as directed. (FOR ICD-10 E10.9, E11.9).  . Calcium Carbonate-Vitamin D (CALCIUM 600/VITAMIN D) 600-400 MG-UNIT per tablet Take 1 tablet by mouth daily.  . cloNIDine (CATAPRES) 0.3 MG tablet TAKE 1 AND 1/2 TABLETS AT BEDTIME  . glucose blood (TRUE METRIX BLOOD GLUCOSE TEST) test strip TEST BLOOD SUGAR ONE TIME DAILY  . HYDROcodone-acetaminophen (NORCO/VICODIN) 5-325 MG tablet One tablet at bedtime , as needed, for back and right leg pain  . lovastatin (MEVACOR) 40 MG tablet Take 1 tablet (40 mg total) by mouth daily.  . metFORMIN (GLUCOPHAGE-XR) 500 MG 24 hr tablet Take 1 tablet (500 mg total) by mouth daily with breakfast.  . Multiple Vitamin (MULTIVITAMIN) tablet Take 1 tablet by mouth daily.  Marland Kitchen spironolactone (ALDACTONE) 25 MG tablet Take 1 tablet (25 mg total) by mouth daily.   No facility-administered encounter medications on file as of 05/16/2018.     Activities of Daily Living In your present state of health, do you have any difficulty performing the following activities: 05/16/2018  Hearing? N  Vision? N  Difficulty concentrating or making decisions? N  Walking or climbing stairs? N  Comment has to take her time with her sciatica pain   Dressing or bathing? N  Doing errands, shopping? N  Preparing Food and eating ? N  Using the Toilet? N  In the past six months, have you accidently leaked urine? N  Do you have problems with loss of bowel control? N  Managing your Medications? N  Managing your Finances? N  Housekeeping or managing your Housekeeping? N  Some recent data might be hidden    Patient Care Team: Fayrene Helper, MD as PCP - General Herminio Commons, MD as PCP - Cardiology (Cardiology)      Assessment:   This is a routine wellness examination for Mikayla Jenkins.  Exercise Activities and Dietary recommendations Current Exercise Habits: Structured exercise class([facility] classes at [facility]), Type of exercise: Other - see comments, Time (Minutes): 45, Frequency (Times/Week): 4, Weekly Exercise (Minutes/Week): 180, Intensity: Moderate, Exercise limited by: orthopedic condition(s)  Goals    None      Fall Risk Fall Risk  05/16/2018 01/09/2018 05/02/2017 06/01/2016 09/25/2015  Falls in the past year? No No  No No No   Is the patient's home free of loose throw rugs in walkways, pet beds, electrical cords, etc?   yes      Grab bars in the bathroom? yes      Handrails on the stairs?   yes      Adequate lighting?   yes  Timed Get Up and Go performed:   Depression Screen PHQ 2/9 Scores 05/16/2018 01/09/2018 05/02/2017 01/27/2016  PHQ - 2 Score 0 0 0 0  PHQ- 9 Score - - - 0     Cognitive Function     6CIT Screen 05/16/2018 05/02/2017  What Year? 0 points 0 points  What month? 0 points 0 points  What time? 0 points 0 points  Count back from 20 0 points 0 points  Months in reverse 0 points 0 points  Repeat phrase 2 points 0 points  Total Score 2 0    Immunization History  Administered Date(s) Administered  . Influenza Split 08/11/2011, 08/23/2012  . Influenza,inj,Quad PF,6+ Mos 08/10/2013, 08/10/2014, 06/30/2015, 07/12/2016, 08/02/2017  . Pneumococcal Conjugate-13 05/07/2014  . Pneumococcal Polysaccharide-23 09/11/2007, 08/11/2011  . Tdap 04/15/2011    Qualifies for Shingles Vaccine? Will ask insurance   Screening Tests Health Maintenance  Topic Date Due  . OPHTHALMOLOGY EXAM  09/27/2017  . INFLUENZA VACCINE  05/18/2018  . HEMOGLOBIN A1C  07/08/2018  . FOOT EXAM  08/31/2018  . MAMMOGRAM  09/20/2019  . TETANUS/TDAP  04/14/2021  . COLONOSCOPY  06/12/2025  . DEXA SCAN  Completed  . Hepatitis C Screening  Completed  . PNA vac Low Risk Adult  Completed    Cancer  Screenings: Lung: Low Dose CT Chest recommended if Age 97-80 years, 30 pack-year currently smoking OR have quit w/in 15years. Patient does not qualify. Breast:  Up to date on Mammogram? Yes   Up to date of Bone Density/Dexa? Yes referral entered  Colorectal: done  Additional Screenings:  Hepatitis C Screening:      Plan:     I have personally reviewed and noted the following in the patient's chart:   . Medical and social history . Use of alcohol, tobacco or illicit drugs  . Current medications and supplements . Functional ability and status . Nutritional status . Physical activity . Advanced directives . List of other physicians . Hospitalizations, surgeries, and ER visits in previous 12 months . Vitals . Screenings to include cognitive, depression, and falls . Referrals and appointments  In addition, I have reviewed and discussed with patient certain preventive protocols, quality metrics, and best practice recommendations. A written personalized care plan for preventive services as well as general preventive health recommendations were provided to patient.     Kate Sable, LPN, LPN  [id number]/[id number]

## 2018-05-16 NOTE — Telephone Encounter (Signed)
Medication is sent in and labs are re ordered that were missed, may have to come in sooner

## 2018-05-16 NOTE — Patient Instructions (Signed)
Mikayla Jenkins , Thank you for taking time to come for your Medicare Wellness Visit. I appreciate your ongoing commitment to your health goals. Please review the following plan we discussed and let me know if I can assist you in the future.    SCHEDULE BONE DENSITY AT CHECKOUT- AM APPT   Screening recommendations/referrals: Colonoscopy: done Mammogram: done Bone Density: need to be scheduled  Recommended yearly ophthalmology/optometry visit for glaucoma screening and checkup Recommended yearly dental visit for hygiene and checkup  Vaccinations: Influenza vaccine: Due In Sept  Pneumococcal vaccine: Done Tdap vaccine: Done Shingles vaccine: ask insurance if shingrix is covered    Advanced directives: given   Conditions/risks identified: done   Next appointment: 08/14/18 10:20am   Preventive Care 73 Years and Older, Female Preventive care refers to lifestyle choices and visits with your health care provider that can promote health and wellness. What does preventive care include?  A yearly physical exam. This is also called an annual well check.  Dental exams once or twice a year.  Routine eye exams. Ask your health care provider how often you should have your eyes checked.  Personal lifestyle choices, including:  Daily care of your teeth and gums.  Regular physical activity.  Eating a healthy diet.  Avoiding tobacco and drug use.  Limiting alcohol use.  Practicing safe sex.  Taking low-dose aspirin every day.  Taking vitamin and mineral supplements as recommended by your health care provider. What happens during an annual well check? The services and screenings done by your health care provider during your annual well check will depend on your age, overall health, lifestyle risk factors, and family history of disease. Counseling  Your health care provider may ask you questions about your:  Alcohol use.  Tobacco use.  Drug use.  Emotional well-being.  Home  and relationship well-being.  Sexual activity.  Eating habits.  History of falls.  Memory and ability to understand (cognition).  Work and work Statistician.  Reproductive health. Screening  You may have the following tests or measurements:  Height, weight, and BMI.  Blood pressure.  Lipid and cholesterol levels. These may be checked every 5 years, or more frequently if you are over 8 years old.  Skin check.  Lung cancer screening. You may have this screening every year starting at age 73 if you have a 30-pack-year history of smoking and currently smoke or have quit within the past 15 years.  Fecal occult blood test (FOBT) of the stool. You may have this test every year starting at age 73.  Flexible sigmoidoscopy or colonoscopy. You may have a sigmoidoscopy every 5 years or a colonoscopy every 10 years starting at age 73.  Hepatitis C blood test.  Hepatitis B blood test.  Sexually transmitted disease (STD) testing.  Diabetes screening. This is done by checking your blood sugar (glucose) after you have not eaten for a while (fasting). You may have this done every 1-3 years.  Bone density scan. This is done to screen for osteoporosis. You may have this done starting at age 73.  Mammogram. This may be done every 1-2 years. Talk to your health care provider about how often you should have regular mammograms. Talk with your health care provider about your test results, treatment options, and if necessary, the need for more tests. Vaccines  Your health care provider may recommend certain vaccines, such as:  Influenza vaccine. This is recommended every year.  Tetanus, diphtheria, and acellular pertussis (Tdap, Td) vaccine. You  may need a Td booster every 10 years.  Zoster vaccine. You may need this after age 73.  Pneumococcal 13-valent conjugate (PCV13) vaccine. One dose is recommended after age 73.  Pneumococcal polysaccharide (PPSV23) vaccine. One dose is recommended  after age 73. Talk to your health care provider about which screenings and vaccines you need and how often you need them. This information is not intended to replace advice given to you by your health care provider. Make sure you discuss any questions you have with your health care provider. Document Released: 10/31/2015 Document Revised: 06/23/2016 Document Reviewed: 08/05/2015 Elsevier Interactive Patient Education  2017 [employer] Prevention in the Home Falls can cause injuries. They can happen to people of all ages. There are many things you can do to make your home safe and to help prevent falls. What can I do on the outside of my home?  Regularly fix the edges of walkways and driveways and fix any cracks.  Remove anything that might make you trip as you walk through a door, such as a raised step or threshold.  Trim any bushes or trees on the path to your home.  Use bright outdoor lighting.  Clear any walking paths of anything that might make someone trip, such as rocks or tools.  Regularly check to see if handrails are loose or broken. Make sure that both sides of any steps have handrails.  Any raised decks and porches should have guardrails on the edges.  Have any leaves, snow, or ice cleared regularly.  Use sand or salt on walking paths during winter.  Clean up any spills in your garage right away. This includes oil or grease spills. What can I do in the bathroom?  Use night lights.  Install grab bars by the toilet and in the tub and shower. Do not use towel bars as grab bars.  Use non-skid mats or decals in the tub or shower.  If you need to sit down in the shower, use a plastic, non-slip stool.  Keep the floor dry. Clean up any water that spills on the floor as soon as it happens.  Remove soap buildup in the tub or shower regularly.  Attach bath mats securely with double-sided non-slip rug tape.  Do not have throw rugs and other things on the floor  that can make you trip. What can I do in the bedroom?  Use night lights.  Make sure that you have a light by your bed that is easy to reach.  Do not use any sheets or blankets that are too big for your bed. They should not hang down onto the floor.  Have a firm chair that has side arms. You can use this for support while you get dressed.  Do not have throw rugs and other things on the floor that can make you trip. What can I do in the kitchen?  Clean up any spills right away.  Avoid walking on wet floors.  Keep items that you use a lot in easy-to-reach places.  If you need to reach something above you, use a strong step stool that has a grab bar.  Keep electrical cords out of the way.  Do not use floor polish or wax that makes floors slippery. If you must use wax, use non-skid floor wax.  Do not have throw rugs and other things on the floor that can make you trip. What can I do with my stairs?  Do not leave any items  on the stairs.  Make sure that there are handrails on both sides of the stairs and use them. Fix handrails that are broken or loose. Make sure that handrails are as long as the stairways.  Check any carpeting to make sure that it is firmly attached to the stairs. Fix any carpet that is loose or worn.  Avoid having throw rugs at the top or bottom of the stairs. If you do have throw rugs, attach them to the floor with carpet tape.  Make sure that you have a light switch at the top of the stairs and the bottom of the stairs. If you do not have them, ask someone to add them for you. What else can I do to help prevent falls?  Wear shoes that:  Do not have high heels.  Have rubber bottoms.  Are comfortable and fit you well.  Are closed at the toe. Do not wear sandals.  If you use a stepladder:  Make sure that it is fully opened. Do not climb a closed stepladder.  Make sure that both sides of the stepladder are locked into place.  Ask someone to hold it  for you, if possible.  Clearly mark and make sure that you can see:  Any grab bars or handrails.  First and last steps.  Where the edge of each step is.  Use tools that help you move around (mobility aids) if they are needed. These include:  Canes.  Walkers.  Scooters.  Crutches.  Turn on the lights when you go into a dark area. Replace any light bulbs as soon as they burn out.  Set up your furniture so you have a clear path. Avoid moving your furniture around.  If any of your floors are uneven, fix them.  If there are any pets around you, be aware of where they are.  Review your medicines with your doctor. Some medicines can make you feel dizzy. This can increase your chance of falling. Ask your doctor what other things that you can do to help prevent falls. This information is not intended to replace advice given to you by your health care provider. Make sure you discuss any questions you have with your health care provider. Document Released: 07/31/2009 Document Revised: 03/11/2016 Document Reviewed: 11/08/2014 Elsevier Interactive Patient Education  2017 [employer].

## 2018-05-16 NOTE — Telephone Encounter (Signed)
At wellness appt pt requested a refill of her hydrocodone that she uses only when needed for severe sciatica pain. Last refill was over 6 months ago. States she only uses it when absolutely needed. Wants it sent to [facility] [place]

## 2018-05-17 LAB — CBC
HCT: 34.6 % — ABNORMAL LOW (ref 35.0–45.0)
Hemoglobin: 11.6 g/dL — ABNORMAL LOW (ref 11.7–15.5)
MCH: 31.5 pg (ref 27.0–33.0)
MCHC: 33.5 g/dL (ref 32.0–36.0)
MCV: 94 fL (ref 80.0–100.0)
MPV: 10.6 fL (ref 7.5–12.5)
PLATELETS: 230 10*3/uL (ref 140–400)
RBC: 3.68 10*6/uL — AB (ref 3.80–5.10)
RDW: 11.5 % (ref 11.0–15.0)
WBC: 6.2 10*3/uL (ref 3.8–10.8)

## 2018-05-17 LAB — COMPLETE METABOLIC PANEL WITH GFR
AG RATIO: 2 (calc) (ref 1.0–2.5)
ALBUMIN MSPROF: 4.5 g/dL (ref 3.6–5.1)
ALT: 11 U/L (ref 6–29)
AST: 18 U/L (ref 10–35)
Alkaline phosphatase (APISO): 81 U/L (ref 33–130)
BILIRUBIN TOTAL: 0.6 mg/dL (ref 0.2–1.2)
BUN / CREAT RATIO: 25 (calc) — AB (ref 6–22)
BUN: 32 mg/dL — ABNORMAL HIGH (ref 7–25)
CALCIUM: 9.7 mg/dL (ref 8.6–10.4)
CHLORIDE: 99 mmol/L (ref 98–110)
CO2: 26 mmol/L (ref 20–32)
Creat: 1.3 mg/dL — ABNORMAL HIGH (ref 0.60–0.93)
GFR, EST AFRICAN AMERICAN: 47 mL/min/{1.73_m2} — AB (ref >=60)
GFR, EST NON AFRICAN AMERICAN: 41 mL/min/{1.73_m2} — AB (ref >=60)
Globulin: 2.3 g/dL (calc) (ref 1.9–3.7)
Glucose, Bld: 173 mg/dL — ABNORMAL HIGH (ref 65–139)
POTASSIUM: 4.2 mmol/L (ref 3.5–5.3)
Sodium: 135 mmol/L (ref 135–146)
Total Protein: 6.8 g/dL (ref 6.1–8.1)

## 2018-05-17 LAB — HEMOGLOBIN A1C
HEMOGLOBIN A1C: 6.9 %{Hb} — AB (ref <5.7)
MEAN PLASMA GLUCOSE: 151 (calc)
eAG (mmol/L): 8.4 (calc)

## 2018-05-17 LAB — MICROALBUMIN, URINE: Microalb, Ur: 0.4 mg/dL

## 2018-05-25 ENCOUNTER — Ambulatory Visit (HOSPITAL_COMMUNITY)
Admission: RE | Admit: 2018-05-25 | Discharge: 2018-05-25 | Disposition: A | Discharge status: Home / Self Care | Payer: Medicare HMO | Source: Ambulatory Visit | Attending: Family Medicine | Admitting: Family Medicine

## 2018-05-25 DIAGNOSIS — M8580 Other specified disorders of bone density and structure, unspecified site: Secondary | ICD-10-CM | POA: Diagnosis not present

## 2018-05-25 DIAGNOSIS — Z13820 Encounter for screening for osteoporosis: Secondary | ICD-10-CM | POA: Diagnosis not present

## 2018-05-25 DIAGNOSIS — M8588 Other specified disorders of bone density and structure, other site: Secondary | ICD-10-CM | POA: Diagnosis not present

## 2018-05-25 DIAGNOSIS — Z780 Asymptomatic menopausal state: Secondary | ICD-10-CM | POA: Diagnosis not present

## 2018-06-01 ENCOUNTER — Other Ambulatory Visit: Payer: Self-pay | Admitting: Family Medicine

## 2018-07-04 ENCOUNTER — Other Ambulatory Visit: Payer: Self-pay | Admitting: Family Medicine

## 2018-07-04 DIAGNOSIS — I10 Essential (primary) hypertension: Secondary | ICD-10-CM

## 2018-07-04 DIAGNOSIS — E785 Hyperlipidemia, unspecified: Secondary | ICD-10-CM

## 2018-08-01 DIAGNOSIS — Z0100 Encounter for examination of eyes and vision without abnormal findings: Secondary | ICD-10-CM | POA: Diagnosis not present

## 2018-08-01 DIAGNOSIS — H5200 Hypermetropia, unspecified eye: Secondary | ICD-10-CM | POA: Diagnosis not present

## 2018-08-01 LAB — HM DIABETES EYE EXAM

## 2018-08-14 ENCOUNTER — Ambulatory Visit (INDEPENDENT_AMBULATORY_CARE_PROVIDER_SITE_OTHER): Payer: Medicare HMO | Admitting: Family Medicine

## 2018-08-14 ENCOUNTER — Encounter: Payer: Self-pay | Admitting: Family Medicine

## 2018-08-14 VITALS — BP 138/78 | HR 57 | Resp 12 | Ht 67.5 in | Wt 149.0 lb

## 2018-08-14 DIAGNOSIS — E1121 Type 2 diabetes mellitus with diabetic nephropathy: Secondary | ICD-10-CM

## 2018-08-14 DIAGNOSIS — M5431 Sciatica, right side: Secondary | ICD-10-CM

## 2018-08-14 DIAGNOSIS — E785 Hyperlipidemia, unspecified: Secondary | ICD-10-CM

## 2018-08-14 DIAGNOSIS — Z1231 Encounter for screening mammogram for malignant neoplasm of breast: Secondary | ICD-10-CM

## 2018-08-14 DIAGNOSIS — I10 Essential (primary) hypertension: Secondary | ICD-10-CM | POA: Diagnosis not present

## 2018-08-14 DIAGNOSIS — R29898 Other symptoms and signs involving the musculoskeletal system: Secondary | ICD-10-CM

## 2018-08-14 DIAGNOSIS — Z23 Encounter for immunization: Secondary | ICD-10-CM | POA: Diagnosis not present

## 2018-08-14 MED ORDER — GABAPENTIN 100 MG PO CAPS: 100.0000 mg | ORAL_CAPSULE | Freq: Every day | ORAL | 3 refills | Status: DC

## 2018-08-14 NOTE — Patient Instructions (Addendum)
F/U in early January, call if you need me before  Flu vaccine today  Microalb , fasting lipid, cmp and EGFr, jhBA1C and TSH next week, ( [facility])   you will be referred for MRI of lumbar spine, and will have neurosurgeon of your choice eval you in consultation, since pain is constant at 10 and  You note weakness of RLE  Mammogram to be scheduled for 12/04 or after   We will send for eye exam 10/15 at [facility], [place]  New is gabapentin one at bedtime for nerve pain

## 2018-08-14 NOTE — Progress Notes (Signed)
Mikayla Jenkins     MRN: [id number]      DOB: Sep 04, 1946   HPI Mikayla Jenkins is here for follow up and re-evaluation of chronic medical conditions, medication management and review of any available recent lab and radiology data.  Preventive health is updated, specifically  Cancer screening and Immunization.   Questions or concerns regarding consultations or procedures which the PT has had in the interim are  addressed. The PT denies any adverse reactions to current medications since the last visit.  C/o worsened and uncontrolled back pain radiating to RLE, no position of comfort, and extremely painful once she puts weight on the RLE, also weakness in the right leg and numbness , denies incontinence of stool or urine Denies polyuria, polydipsia, blurred vision , or hypoglycemic episodes.  ROS Denies recent fever or chills. Denies sinus pressure, nasal congestion, ear pain or sore throat. Denies chest congestion, productive cough or wheezing. Denies chest pains, palpitations and leg swelling Denies abdominal pain, nausea, vomiting,diarrhea or constipation.   Denies dysuria, frequency, hesitancy or incontinence.  Denies headaches, seizures, . Denies depression, anxiety, has some  Insomnia.due to pain in LE Denies skin break down or rash.   Jenkins  BP 138/78   Pulse (!) 57   Resp 12   Ht 5' 7.5" (1.715 m)   Wt 149 lb (67.6 kg)   SpO2 99% Comment: room air  BMI 22.99 kg/m    Patient alert and oriented and in no cardiopulmonary distress.Pt in pain   HEENT: No facial asymmetry, EOMI,   oropharynx pink and moist.  Neck supple no JVD, no mass.  Chest: Clear to auscultation bilaterally.  CVS: S1, S2 no murmurs, no S3.Regular rate.  ABD: Soft non tender.   Ext: No edema  ER:[id number]  ROM lumbar spine adequate in shoulders, hips and knees.  Skin: Intact, no ulcerations or rash noted.  Psych: Good eye contact, normal affect. Memory intact not anxious or depressed appearing.  CNS: CN  2-12 intact,grade 4  Power and reduced sensation in RLE, otherwise normal   Assessment & Plan  Malignant hypertension Adequate control, no med change DASH diet and commitment to daily physical activity for a minimum of 30 minutes discussed and encouraged, as a part of hypertension management. The importance of attaining a healthy weight is also discussed.  BP/Weight 08/14/2018 05/16/2018 03/01/2018 01/09/2018 11/01/2017 09/20/2017 85/63/1497  Systolic BP 026 378 588 502 774 128 786  Diastolic BP 78 80 84 68 76 74 86  Wt. (Lbs) 149 152 151 147.12 150 - 151  BMI 22.99 23.81 23.65 22.7 23.49 - 23.65       Type 2 diabetes with nephropathy ([place]) Mikayla Jenkins is reminded of the importance of commitment to daily physical activity for 30 minutes or more, as able and the need to limit carbohydrate intake to 30 to 60 grams per meal to help with blood sugar control.   The need to take medication as prescribed, test blood sugar as directed, and to call between visits if there is a concern that blood sugar is uncontrolled is also discussed.   Mikayla Jenkins is reminded of the importance of daily foot exam, annual eye examination, and good blood sugar, blood pressure and cholesterol control. Controlled, no change in medication   Diabetic Labs Latest Ref Rng & Units 05/16/2018 05/08/2018 01/05/2018 11/01/2017 09/12/2017  HbA1c <5.7 % of total Hgb 6.9(H) - 6.9(H) - -  Microalbumin mg/dL 0.4 - - - -  Micro/Creat Ratio <30  mcg/mg creat - - - - -  Chol <200 mg/dL - - 183 - -  HDL >50 mg/dL - - 90 - -  Calc LDL mg/dL (calc) - - 80 - -  Triglycerides <150 mg/dL - - 58 - -  Creatinine 0.60 - 0.93 mg/dL 1.30(H) 1.47(H) 1.05(H) 1.11(H) 1.06(H)   BP/Weight 08/14/2018 05/16/2018 03/01/2018 01/09/2018 11/01/2017 09/20/2017 09/81/1914  Systolic BP 782 956 213 086 578 469 629  Diastolic BP 78 80 84 68 76 74 86  Wt. 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

## 2018-08-20 NOTE — Assessment & Plan Note (Signed)
After obtaining informed consent, the vaccine is  administered by LPN.  

## 2018-08-20 NOTE — Assessment & Plan Note (Signed)
Mikayla Jenkins is reminded of the importance of commitment to daily physical activity for 30 minutes or more, as able and the need to limit carbohydrate intake to 30 to 60 grams per meal to help with blood sugar control.   The need to take medication as prescribed, test blood sugar as directed, and to call between visits if there is a concern that blood sugar is uncontrolled is also discussed.   Mikayla Jenkins is reminded of the importance of daily foot exam, annual eye examination, and good blood sugar, blood pressure and cholesterol control. Controlled, no change in medication   Diabetic Labs Latest Ref Rng & Units 05/16/2018 05/08/2018 01/05/2018 11/01/2017 09/12/2017  HbA1c <5.7 % of total Hgb 6.9(H) - 6.9(H) - -  Microalbumin mg/dL 0.4 - - - -  Micro/Creat Ratio <30 mcg/mg creat - - - - -  Chol <200 mg/dL - - 183 - -  HDL >50 mg/dL - - 90 - -  Calc LDL mg/dL (calc) - - 80 - -  Triglycerides <150 mg/dL - - 58 - -  Creatinine 0.60 - 0.93 mg/dL 1.30(H) 1.47(H) 1.05(H) 1.11(H) 1.06(H)   BP/Weight 08/14/2018 05/16/2018 03/01/2018 01/09/2018 11/01/2017 09/20/2017 28/83/3744  Systolic BP 514 604 799 872 158 727 618  Diastolic BP 78 80 84 68 76 74 86  Wt. (Lbs) 149 152 151 147.12 150 - 151  BMI 22.99 23.81 23.65 22.7 23.49 - 23.65   Foot/eye exam completion dates Latest Ref Rng & Units 08/01/2018 08/29/2017  Eye Exam No Retinopathy No Retinopathy -  Foot Form Completion - - Done

## 2018-08-20 NOTE — Assessment & Plan Note (Addendum)
Adequate control, no med change DASH diet and commitment to daily physical activity for a minimum of 30 minutes discussed and encouraged, as a part of hypertension management. The importance of attaining a healthy weight is also discussed.  BP/Weight 08/14/2018 05/16/2018 03/01/2018 01/09/2018 11/01/2017 09/20/2017 01/06/2247  Systolic BP 250 037 048 889 169 450 388  Diastolic BP 78 80 84 68 76 74 86  Wt. (Lbs) 149 152 151 147.12 150 - 151  BMI 22.99 23.81 23.65 22.7 23.49 - 23.65

## 2018-08-20 NOTE — Assessment & Plan Note (Signed)
Increased, uncontrolled and disabling pain, constant at A 10, WITH Rle WEAKNESS AND NUMBNESS, already has established disc and arthritic disease of lumbar spine, refer for MRI and neurosurgery or spine surgery eval based on pt preference

## 2018-08-21 DIAGNOSIS — E785 Hyperlipidemia, unspecified: Secondary | ICD-10-CM | POA: Diagnosis not present

## 2018-08-21 DIAGNOSIS — E1121 Type 2 diabetes mellitus with diabetic nephropathy: Secondary | ICD-10-CM | POA: Diagnosis not present

## 2018-08-21 DIAGNOSIS — I10 Essential (primary) hypertension: Secondary | ICD-10-CM | POA: Diagnosis not present

## 2018-08-21 DIAGNOSIS — Z0000 Encounter for general adult medical examination without abnormal findings: Secondary | ICD-10-CM | POA: Diagnosis not present

## 2018-08-22 ENCOUNTER — Encounter: Payer: Self-pay | Admitting: Family Medicine

## 2018-08-22 LAB — LIPID PANEL
CHOL/HDL RATIO: 2.1 (calc) (ref <5.0)
Cholesterol: 185 mg/dL (ref <200)
HDL: 90 mg/dL (ref >50)
LDL CHOLESTEROL (CALC): 81 mg/dL
NON-HDL CHOLESTEROL (CALC): 95 mg/dL (ref <130)
TRIGLYCERIDES: 68 mg/dL (ref <150)

## 2018-08-22 LAB — COMPLETE METABOLIC PANEL WITH GFR
AG Ratio: 1.8 (calc) (ref 1.0–2.5)
ALKALINE PHOSPHATASE (APISO): 72 U/L (ref 33–130)
ALT: 13 U/L (ref 6–29)
AST: 18 U/L (ref 10–35)
Albumin: 4.4 g/dL (ref 3.6–5.1)
BILIRUBIN TOTAL: 0.8 mg/dL (ref 0.2–1.2)
BUN/Creatinine Ratio: 19 (calc) (ref 6–22)
BUN: 24 mg/dL (ref 7–25)
CHLORIDE: 101 mmol/L (ref 98–110)
CO2: 28 mmol/L (ref 20–32)
Calcium: 10.2 mg/dL (ref 8.6–10.4)
Creat: 1.27 mg/dL — ABNORMAL HIGH (ref 0.60–0.93)
GFR, Est African American: 48 mL/min/{1.73_m2} — ABNORMAL LOW (ref >=60)
GFR, Est Non African American: 42 mL/min/{1.73_m2} — ABNORMAL LOW (ref >=60)
GLUCOSE: 147 mg/dL — AB (ref 65–99)
Globulin: 2.4 g/dL (calc) (ref 1.9–3.7)
Potassium: 4.4 mmol/L (ref 3.5–5.3)
Sodium: 138 mmol/L (ref 135–146)
Total Protein: 6.8 g/dL (ref 6.1–8.1)

## 2018-08-22 LAB — HEMOGLOBIN A1C
HEMOGLOBIN A1C: 7 %{Hb} — AB (ref <5.7)
Mean Plasma Glucose: 154 (calc)
eAG (mmol/L): 8.5 (calc)

## 2018-08-22 LAB — TSH: TSH: 0.87 mIU/L (ref 0.40–4.50)

## 2018-08-23 ENCOUNTER — Other Ambulatory Visit: Payer: Self-pay | Admitting: Family Medicine

## 2018-08-23 ENCOUNTER — Ambulatory Visit (HOSPITAL_COMMUNITY)
Admission: RE | Admit: 2018-08-23 | Discharge: 2018-08-23 | Disposition: A | Discharge status: Home / Self Care | Payer: Medicare HMO | Source: Ambulatory Visit | Attending: Family Medicine | Admitting: Family Medicine

## 2018-08-23 DIAGNOSIS — M419 Scoliosis, unspecified: Secondary | ICD-10-CM | POA: Diagnosis not present

## 2018-08-23 DIAGNOSIS — M48061 Spinal stenosis, lumbar region without neurogenic claudication: Secondary | ICD-10-CM | POA: Insufficient documentation

## 2018-08-23 DIAGNOSIS — M5126 Other intervertebral disc displacement, lumbar region: Secondary | ICD-10-CM | POA: Insufficient documentation

## 2018-08-23 DIAGNOSIS — R29898 Other symptoms and signs involving the musculoskeletal system: Secondary | ICD-10-CM | POA: Insufficient documentation

## 2018-08-23 DIAGNOSIS — M4316 Spondylolisthesis, lumbar region: Secondary | ICD-10-CM | POA: Diagnosis not present

## 2018-08-23 DIAGNOSIS — Z1231 Encounter for screening mammogram for malignant neoplasm of breast: Secondary | ICD-10-CM

## 2018-08-23 DIAGNOSIS — M545 Low back pain: Secondary | ICD-10-CM | POA: Diagnosis not present

## 2018-08-23 DIAGNOSIS — M5431 Sciatica, right side: Secondary | ICD-10-CM | POA: Insufficient documentation

## 2018-08-25 ENCOUNTER — Telehealth: Payer: Self-pay

## 2018-08-25 DIAGNOSIS — M48061 Spinal stenosis, lumbar region without neurogenic claudication: Secondary | ICD-10-CM

## 2018-08-25 NOTE — Telephone Encounter (Signed)
-----   Message from Fayrene Helper, MD sent at 08/23/2018 12:23 PM EST ----- Your recent MRI shows that  the arthritis in your back is more severe , please let us know who you want to be referred to as a spine / neurosurgeon so that you Lucianne Lei be referred for consultation ans to what is the best option for you to enjoy an improved quality of life (pls refer pt if she has already decided, dx is spinal stenosis,I will sign, thanks)

## 2018-08-28 ENCOUNTER — Other Ambulatory Visit: Payer: Self-pay | Admitting: Family Medicine

## 2018-09-21 ENCOUNTER — Ambulatory Visit (HOSPITAL_COMMUNITY)
Admission: RE | Admit: 2018-09-21 | Discharge: 2018-09-21 | Disposition: A | Discharge status: Home / Self Care | Payer: Medicare HMO | Source: Ambulatory Visit | Attending: Family Medicine | Admitting: Family Medicine

## 2018-09-21 DIAGNOSIS — Z1231 Encounter for screening mammogram for malignant neoplasm of breast: Secondary | ICD-10-CM | POA: Diagnosis not present

## 2018-09-22 ENCOUNTER — Other Ambulatory Visit: Payer: Self-pay | Admitting: Family Medicine

## 2018-09-22 DIAGNOSIS — R928 Other abnormal and inconclusive findings on diagnostic imaging of breast: Secondary | ICD-10-CM

## 2018-09-26 ENCOUNTER — Ambulatory Visit (HOSPITAL_COMMUNITY): Payer: Medicare HMO

## 2018-09-26 ENCOUNTER — Encounter (HOSPITAL_COMMUNITY): Payer: Self-pay

## 2018-09-26 ENCOUNTER — Ambulatory Visit (HOSPITAL_COMMUNITY)
Admission: RE | Admit: 2018-09-26 | Discharge: 2018-09-26 | Disposition: A | Discharge status: Home / Self Care | Payer: Medicare HMO | Source: Ambulatory Visit | Attending: Family Medicine | Admitting: Family Medicine

## 2018-09-26 DIAGNOSIS — R922 Inconclusive mammogram: Secondary | ICD-10-CM | POA: Diagnosis not present

## 2018-09-26 DIAGNOSIS — R928 Other abnormal and inconclusive findings on diagnostic imaging of breast: Secondary | ICD-10-CM | POA: Insufficient documentation

## 2018-10-19 ENCOUNTER — Telehealth: Payer: Self-pay | Admitting: *Deleted

## 2018-10-19 ENCOUNTER — Ambulatory Visit (INDEPENDENT_AMBULATORY_CARE_PROVIDER_SITE_OTHER): Payer: Medicare HMO | Admitting: Family Medicine

## 2018-10-19 ENCOUNTER — Encounter: Payer: Self-pay | Admitting: Family Medicine

## 2018-10-19 VITALS — BP 134/80 | HR 57 | Resp 15 | Ht 68.0 in | Wt 150.1 lb

## 2018-10-19 DIAGNOSIS — Z0000 Encounter for general adult medical examination without abnormal findings: Secondary | ICD-10-CM

## 2018-10-19 DIAGNOSIS — E785 Hyperlipidemia, unspecified: Secondary | ICD-10-CM

## 2018-10-19 DIAGNOSIS — M5431 Sciatica, right side: Secondary | ICD-10-CM

## 2018-10-19 DIAGNOSIS — I10 Essential (primary) hypertension: Secondary | ICD-10-CM

## 2018-10-19 DIAGNOSIS — M5442 Lumbago with sciatica, left side: Secondary | ICD-10-CM | POA: Diagnosis not present

## 2018-10-19 DIAGNOSIS — G8929 Other chronic pain: Secondary | ICD-10-CM | POA: Diagnosis not present

## 2018-10-19 DIAGNOSIS — E1121 Type 2 diabetes mellitus with diabetic nephropathy: Secondary | ICD-10-CM

## 2018-10-19 MED ORDER — BENAZEPRIL HCL 40 MG PO TABS: 40.0000 mg | ORAL_TABLET | Freq: Every day | ORAL | 1 refills | Status: DC

## 2018-10-19 MED ORDER — ATENOLOL-CHLORTHALIDONE 100-25 MG PO TABS: 1.0000 | ORAL_TABLET | Freq: Every day | ORAL | 1 refills | Status: DC

## 2018-10-19 MED ORDER — METFORMIN HCL ER 500 MG PO TB24: 500.0000 mg | ORAL_TABLET | Freq: Every day | ORAL | 3 refills | Status: DC

## 2018-10-19 MED ORDER — HYDROCODONE-ACETAMINOPHEN 5-325 MG PO TABS: ORAL_TABLET | ORAL | 0 refills | Status: DC

## 2018-10-19 MED ORDER — LOVASTATIN 40 MG PO TABS: 40.0000 mg | ORAL_TABLET | Freq: Every day | ORAL | 1 refills | Status: DC

## 2018-10-19 MED ORDER — CLONIDINE HCL 0.3 MG PO TABS: ORAL_TABLET | ORAL | 1 refills | Status: DC

## 2018-10-19 NOTE — Telephone Encounter (Signed)
Called patient to let her know about appt at [facility] and Neurosurgery on Oct 27, 2018 at 2:30.

## 2018-10-19 NOTE — Patient Instructions (Addendum)
F/u first week in April, call if you need me sooner  You are referred to pain clinic,urgently, office will call this pm with info  limited hydrocodone prescribed for pain , stop gabapentin as not effective  HBA1c, cmp and eGFR non fasting 1 week before  April appt 

## 2018-10-19 NOTE — Progress Notes (Signed)
Mikayla Jenkins     MRN: [id number]      DOB: 1946/03/24  HPI: Patient is in for annual physical exam. Uncontrolled back and RLE pain rated at a 10, states gabapentin of no benefit , and made her feel " drunk" , hence discontinued Denies polyuria, polydipsia, blurred vision , or hypoglycemic episodes.   Recent labs, are reviewed. Immunization is reviewed , and  updated if needed.   PE: BP 134/80   Pulse (!) 57   Resp 15   Ht 5\' 8"  (1.727 m)   Wt 150 lb 1.9 oz (68.1 kg)   SpO2 98%   BMI 22.83 kg/m   Pleasant  female, alert and oriented x 3, in no cardio-pulmonary distress.Patient in pain  Afebrile. HEENT No facial trauma or asymetry. Sinuses non tender.  Extra occullar muscles intact, pupils equally reactive to light. External ears normal, tympanic membranes clear. Oropharynx moist, no exudate. Neck: supple, no adenopathy,JVD or thyromegaly.No bruits.  Chest: Clear to ascultation bilaterally.No crackles or wheezes. Non tender to palpation  Breast: No asymetry,no masses or lumps. No tenderness. No nipple discharge or inversion. No axillary or supraclavicular adenopathy  Cardiovascular system; Heart sounds normal,  S1 and  S2 ,no S3.  No murmur, or thrill. Apical beat not displaced Peripheral pulses normal.  Abdomen: Soft, non tender, no organomegaly or masses. No bruits. Bowel sounds normal. No guarding, tenderness or rebound.     Musculoskeletal exam: Markedly reduced  ROM of spine, hips ,adequate in  shoulders and knees. No deformity ,swelling or crepitus noted. LLE muscle wasting noted  Neurologic: Cranial nerves 2 to 12 intact. Decreased Power in LLE  tone ,sensation and reflexes normal throughout.  disturbance in gait. No tremor.  Skin: Intact, no ulceration, erythema , scaling or rash noted. Pigmentation normal throughout  Psych; Tearful and anxious mood due to pain. Judgement and concentration normal   Assessment & Plan:  Annual physical  exam Annual exam as documented. Counseling done  re healthy lifestyle involving commitment to 150 minutes exercise per week, heart healthy diet, and attaining healthy weight.The importance of adequate sleep also discussed. Regular seat belt use and home safety, is also discussed. Changes in health habits are decided on by the patient with goals and time frames  set for achieving them. Immunization and cancer screening needs are specifically addressed at this visit.   Sciatica of right side Uncontrolled and debilitating. Follow through on referral to pain clinic asap Limited hydrocodone prescribed and pt re educated re importance of limiting use and ependence , which she does Stop gabapentin as ineffective  Type 2 diabetes with nephropathy ([place]) Controlled, no change in medication Ms. Mix is reminded of the importance of commitment to daily physical activity for 30 minutes or more, as able and the need to limit carbohydrate intake to 30 to 60 grams per meal to help with blood sugar control.   The need to take medication as prescribed, test blood sugar as directed, and to call between visits if there is a concern that blood sugar is uncontrolled is also discussed.   [name] is reminded of the importance of daily foot exam, annual eye examination, and good blood sugar, blood pressure and cholesterol control.  Diabetic Labs Latest Ref Rng & Units 08/21/2018 05/16/2018 05/08/2018 01/05/2018 11/01/2017  HbA1c <5.7 % of total Hgb 7.0(H) 6.9(H) - 6.9(H) -  Microalbumin mg/dL - 0.4 - - -  Micro/Creat Ratio <30 mcg/mg creat - - - - -  Chol <200 mg/dL 185 - - 183 -  HDL >50 mg/dL 90 - - 90 -  Calc LDL mg/dL (calc) 81 - - 80 -  Triglycerides <150 mg/dL 68 - - 58 -  Creatinine 0.60 - 0.93 mg/dL 1.27(H) 1.30(H) 1.47(H) 1.05(H) 1.11(H)   BP/Weight 10/19/2018 08/14/2018 05/16/2018 03/01/2018 01/09/2018 11/01/2017 02/15/8334  Systolic BP 825 189 842 103 128 118 867  Diastolic BP 80 78 80 84 68 76 74  Wt.  (Lbs) 150.12 149 152 151 147.12 150 -  BMI 22.83 22.99 23.81 23.65 22.7 23.49 -   Foot/eye exam completion dates Latest Ref Rng & Units 10/19/2018 08/01/2018  Eye Exam No Retinopathy - No Retinopathy  Foot Form Completion - Done -

## 2018-10-20 NOTE — Assessment & Plan Note (Signed)
Controlled, no change in medication Ms. Yuhasz is reminded of the importance of commitment to daily physical activity for 30 minutes or more, as able and the need to limit carbohydrate intake to 30 to 60 grams per meal to help with blood sugar control.   The need to take medication as prescribed, test blood sugar as directed, and to call between visits if there is a concern that blood sugar is uncontrolled is also discussed.   Ms. Flam is reminded of the importance of daily foot exam, annual eye examination, and good blood sugar, blood pressure and cholesterol control.  Diabetic Labs Latest Ref Rng & Units 08/21/2018 05/16/2018 05/08/2018 01/05/2018 11/01/2017  HbA1c <5.7 % of total Hgb 7.0(H) 6.9(H) - 6.9(H) -  Microalbumin mg/dL - 0.4 - - -  Micro/Creat Ratio <30 mcg/mg creat - - - - -  Chol <200 mg/dL 185 - - 183 -  HDL >50 mg/dL 90 - - 90 -  Calc LDL mg/dL (calc) 81 - - 80 -  Triglycerides <150 mg/dL 68 - - 58 -  Creatinine 0.60 - 0.93 mg/dL 1.27(H) 1.30(H) 1.47(H) 1.05(H) 1.11(H)   BP/Weight 10/19/2018 08/14/2018 05/16/2018 03/01/2018 01/09/2018 11/01/2017 15/06/4584  Systolic BP 929 244 628 638 177 116 579  Diastolic BP 80 78 80 84 68 76 74  Wt. (Lbs) 150.12 149 152 151 147.12 150 -  BMI 22.83 22.99 23.81 23.65 22.7 23.49 -   Foot/eye exam completion dates Latest Ref Rng & Units 10/19/2018 08/01/2018  Eye Exam No Retinopathy - No Retinopathy  Foot Form Completion - Done -

## 2018-10-20 NOTE — Assessment & Plan Note (Signed)

## 2018-10-20 NOTE — Assessment & Plan Note (Signed)
Uncontrolled and debilitating. Follow through on referral to pain clinic asap Limited hydrocodone prescribed and pt re educated re importance of limiting use and ependence , which she does Stop gabapentin as ineffective

## 2018-10-27 DIAGNOSIS — M419 Scoliosis, unspecified: Secondary | ICD-10-CM | POA: Diagnosis not present

## 2018-10-27 DIAGNOSIS — M5416 Radiculopathy, lumbar region: Secondary | ICD-10-CM | POA: Diagnosis not present

## 2018-11-03 ENCOUNTER — Other Ambulatory Visit: Payer: Self-pay | Admitting: Neurological Surgery

## 2018-11-03 DIAGNOSIS — M5416 Radiculopathy, lumbar region: Secondary | ICD-10-CM

## 2018-11-08 ENCOUNTER — Ambulatory Visit
Admission: RE | Admit: 2018-11-08 | Discharge: 2018-11-08 | Disposition: A | Discharge status: Home / Self Care | Payer: Medicare HMO | Source: Ambulatory Visit | Attending: Neurological Surgery | Admitting: Neurological Surgery

## 2018-11-08 DIAGNOSIS — M5416 Radiculopathy, lumbar region: Secondary | ICD-10-CM

## 2018-11-08 DIAGNOSIS — M48061 Spinal stenosis, lumbar region without neurogenic claudication: Secondary | ICD-10-CM | POA: Diagnosis not present

## 2018-11-08 MED ORDER — IOPAMIDOL (ISOVUE-M 200) INJECTION 41%
1.0000 mL | Freq: Once | INTRAMUSCULAR | Status: AC
  Administered 2018-11-08: 1 mL via EPIDURAL

## 2018-11-08 MED ORDER — METHYLPREDNISOLONE ACETATE 40 MG/ML INJ SUSP (RADIOLOG
120.0000 mg | Freq: Once | INTRAMUSCULAR | Status: AC
  Administered 2018-11-08: 120 mg via EPIDURAL

## 2018-11-08 NOTE — Discharge Instructions (Signed)

## 2018-11-24 ENCOUNTER — Other Ambulatory Visit: Payer: Self-pay | Admitting: Neurological Surgery

## 2018-11-24 DIAGNOSIS — M5416 Radiculopathy, lumbar region: Secondary | ICD-10-CM

## 2018-11-29 ENCOUNTER — Ambulatory Visit
Admission: RE | Admit: 2018-11-29 | Discharge: 2018-11-29 | Disposition: A | Discharge status: Home / Self Care | Payer: Medicare HMO | Source: Ambulatory Visit | Attending: Neurological Surgery | Admitting: Neurological Surgery

## 2018-11-29 DIAGNOSIS — M5416 Radiculopathy, lumbar region: Secondary | ICD-10-CM | POA: Diagnosis not present

## 2018-11-29 MED ORDER — IOPAMIDOL (ISOVUE-M 200) INJECTION 41%
1.0000 mL | Freq: Once | INTRAMUSCULAR | Status: AC
  Administered 2018-11-29: 1 mL via EPIDURAL

## 2018-11-29 MED ORDER — METHYLPREDNISOLONE ACETATE 40 MG/ML INJ SUSP (RADIOLOG
120.0000 mg | Freq: Once | INTRAMUSCULAR | Status: AC
  Administered 2018-11-29: 120 mg via EPIDURAL

## 2018-11-29 NOTE — Discharge Instructions (Signed)

## 2019-01-17 DIAGNOSIS — I10 Essential (primary) hypertension: Secondary | ICD-10-CM | POA: Diagnosis not present

## 2019-01-17 DIAGNOSIS — E1121 Type 2 diabetes mellitus with diabetic nephropathy: Secondary | ICD-10-CM | POA: Diagnosis not present

## 2019-01-18 LAB — COMPLETE METABOLIC PANEL WITH GFR
AG Ratio: 1.9 (calc) (ref 1.0–2.5)
ALT: 13 U/L (ref 6–29)
AST: 17 U/L (ref 10–35)
Albumin: 4.4 g/dL (ref 3.6–5.1)
Alkaline phosphatase (APISO): 64 U/L (ref 37–153)
BUN/Creatinine Ratio: 18 (calc) (ref 6–22)
BUN: 28 mg/dL — AB (ref 7–25)
CO2: 27 mmol/L (ref 20–32)
CREATININE: 1.58 mg/dL — AB (ref 0.60–0.93)
Calcium: 10 mg/dL (ref 8.6–10.4)
Chloride: 104 mmol/L (ref 98–110)
GFR, Est African American: 37 mL/min/{1.73_m2} — ABNORMAL LOW (ref >=60)
GFR, Est Non African American: 32 mL/min/{1.73_m2} — ABNORMAL LOW (ref >=60)
Globulin: 2.3 g/dL (calc) (ref 1.9–3.7)
Glucose, Bld: 158 mg/dL — ABNORMAL HIGH (ref 65–99)
Potassium: 4.3 mmol/L (ref 3.5–5.3)
Sodium: 139 mmol/L (ref 135–146)
Total Bilirubin: 0.8 mg/dL (ref 0.2–1.2)
Total Protein: 6.7 g/dL (ref 6.1–8.1)

## 2019-01-18 LAB — HEMOGLOBIN A1C
Hgb A1c MFr Bld: 8.4 % of total Hgb — ABNORMAL HIGH (ref <5.7)
Mean Plasma Glucose: 194 (calc)
eAG (mmol/L): 10.8 (calc)

## 2019-01-19 ENCOUNTER — Other Ambulatory Visit: Payer: Self-pay | Admitting: Family Medicine

## 2019-01-19 MED ORDER — GLIPIZIDE ER 2.5 MG PO TB24: 2.5000 mg | ORAL_TABLET | Freq: Every day | ORAL | 3 refills | Status: DC

## 2019-01-19 NOTE — Progress Notes (Signed)
Glipizide?

## 2019-01-25 ENCOUNTER — Other Ambulatory Visit: Payer: Self-pay

## 2019-01-25 ENCOUNTER — Encounter: Payer: Self-pay | Admitting: Family Medicine

## 2019-01-25 ENCOUNTER — Ambulatory Visit (INDEPENDENT_AMBULATORY_CARE_PROVIDER_SITE_OTHER): Payer: Medicare HMO | Admitting: Family Medicine

## 2019-01-25 VITALS — BP 138/72 | Ht 68.0 in | Wt 151.0 lb

## 2019-01-25 DIAGNOSIS — M5431 Sciatica, right side: Secondary | ICD-10-CM

## 2019-01-25 DIAGNOSIS — E1322 Other specified diabetes mellitus with diabetic chronic kidney disease: Secondary | ICD-10-CM

## 2019-01-25 DIAGNOSIS — E1122 Type 2 diabetes mellitus with diabetic chronic kidney disease: Secondary | ICD-10-CM | POA: Diagnosis not present

## 2019-01-25 DIAGNOSIS — I129 Hypertensive chronic kidney disease with stage 1 through stage 4 chronic kidney disease, or unspecified chronic kidney disease: Secondary | ICD-10-CM | POA: Diagnosis not present

## 2019-01-25 DIAGNOSIS — I10 Essential (primary) hypertension: Secondary | ICD-10-CM

## 2019-01-25 DIAGNOSIS — E785 Hyperlipidemia, unspecified: Secondary | ICD-10-CM | POA: Diagnosis not present

## 2019-01-25 DIAGNOSIS — N184 Chronic kidney disease, stage 4 (severe): Secondary | ICD-10-CM

## 2019-01-25 DIAGNOSIS — E1365 Other specified diabetes mellitus with hyperglycemia: Principal | ICD-10-CM

## 2019-01-25 DIAGNOSIS — IMO0002 Reserved for concepts with insufficient information to code with codable children: Secondary | ICD-10-CM

## 2019-01-25 DIAGNOSIS — E1165 Type 2 diabetes mellitus with hyperglycemia: Secondary | ICD-10-CM | POA: Diagnosis not present

## 2019-01-25 MED ORDER — LINAGLIPTIN 5 MG PO TABS: 5.0000 mg | ORAL_TABLET | Freq: Every day | ORAL | 5 refills | Status: DC

## 2019-01-25 NOTE — Addendum Note (Signed)
Addended by: Eual Fines on: 01/25/2019 11:29 AM   Modules accepted: Orders

## 2019-01-25 NOTE — Progress Notes (Signed)
Virtual Visit via Telephone Note  I connected with Earlyne Iba on 01/25/19 at 10:20 AM EDT by telephone and verified that I am speaking with the correct person using two identifiers.   I discussed the limitations, risks, security and privacy concerns of performing an evaluation and management service by telephone and the availability of in person appointments. I also discussed with the patient that there may be a patient responsible charge related to this service. The patient expressed understanding and agreed to proceed. I am in my home and the patient is at her  House. Visual contact though desired is not possible   History of Present Illness: F/u chronic problems and lab review Reports marked improvement in back and leg pain with epidurals. Was aware that blood sugar was high, over 200 even, but thought she could work through this on her own, unfortunately blood sugar now uncontrolled with deterioration in kidney function. She is highly motivated to correcting this and understands the ability and the need to call in with concerns Denies recent fever or chills. Denies sinus pressure, nasal congestion, ear pain or sore throat. Denies chest congestion, productive cough or wheezing. Denies chest pains, palpitations and leg swelling Denies abdominal pain, nausea, vomiting,diarrhea or constipation.   Denies dysuria, frequency, hesitancy or incontinence. Denies uncontrolled joint pain, swelling and limitation in mobility. Denies headaches, seizures, numbness, or tingling. Denies depression, anxiety or insomnia. Denies skin break down or rash.       Observations/Objective: BP 138/72   Ht 5\' 8"  (1.727 m)   Wt 151 lb (68.5 kg)   BMI 22.96 kg/m    Assessment and Plan: Malignant hypertension Controlled, no change in medication DASH diet and commitment to daily physical activity for a minimum of 30 minutes discussed and encouraged, as a part of hypertension management. The importance of  attaining a healthy weight is also discussed.  BP/Weight 01/25/2019 11/29/2018 11/08/2018 10/19/2018 08/14/2018 05/16/2018 3/41/9622  Systolic BP 297 989 211 941 740 814 481  Diastolic BP 72 65 79 80 78 80 84  Wt. (Lbs) 151 - - 150.12 149 152 151  BMI 22.96 - - 22.83 22.99 23.81 23.65       Uncontrolled secondary diabetes mellitus with stage 4 CKD (GFR 15-29) (HCC) Marked deterioration due to recent epidural. Discontinue metformin, add tradgenta and continue ow dose glipizide Updated lab needed at/ before next visit. Ms. Hegler is reminded of the importance of commitment to daily physical activity for 30 minutes or more, as able and the need to limit carbohydrate intake to 30 to 60 grams per meal to help with blood sugar control.   The need to take medication as prescribed, test blood sugar as directed, and to call between visits if there is a concern that blood sugar is uncontrolled is also discussed.   Ms. Boutin is reminded of the importance of daily foot exam, annual eye examination, and good blood sugar, blood pressure and cholesterol control.  Diabetic Labs Latest Ref Rng & Units 01/17/2019 08/21/2018 05/16/2018 05/08/2018 01/05/2018  HbA1c <5.7 % of total Hgb 8.4(H) 7.0(H) 6.9(H) - 6.9(H)  Microalbumin mg/dL - - 0.4 - -  Micro/Creat Ratio <30 mcg/mg creat - - - - -  Chol <200 mg/dL - 185 - - 183  HDL >50 mg/dL - 90 - - 90  Calc LDL mg/dL (calc) - 81 - - 80  Triglycerides <150 mg/dL - 68 - - 58  Creatinine 0.60 - 0.93 mg/dL 1.58(H) 1.27(H) 1.30(H) 1.47(H) 1.05(H)  BP/Weight 01/25/2019 11/29/2018 11/08/2018 10/19/2018 08/14/2018 05/16/2018 2/83/6629  Systolic BP 476 546 503 546 568 127 517  Diastolic BP 72 65 79 80 78 80 84  Wt. (Lbs) 151 - - 150.12 149 152 151  BMI 22.96 - - 22.83 22.99 23.81 23.65   Foot/eye exam completion dates Latest Ref Rng & Units 10/19/2018 08/01/2018  Eye Exam No Retinopathy - No Retinopathy  Foot Form Completion - Done -        Hyperlipidemia LDL goal  <100 Hyperlipidemia:Low fat diet discussed and encouraged.   Lipid Panel  Lab Results  Component Value Date   CHOL 185 08/21/2018   HDL 90 08/21/2018   LDLCALC 81 08/21/2018   TRIG 68 08/21/2018   CHOLHDL 2.1 08/21/2018   Updated lab needed at/ before next visit.     Sciatica of right side Marked improvement with epidural injections earlier this year    Follow Up Instructions:    I discussed the assessment and treatment plan with the patient. The patient was provided an opportunity to ask questions and all were answered. The patient agreed with the plan and demonstrated an understanding of the instructions.   The patient was advised to call back or seek an in-person evaluation if the symptoms worsen or if the condition fails to improve as anticipated.  I provided 30  minutes of non-face-to-face time during this encounter.   Tula Nakayama, MD

## 2019-01-25 NOTE — Assessment & Plan Note (Signed)
Marked improvement with epidural injections earlier this year

## 2019-01-25 NOTE — Patient Instructions (Addendum)
F/U in 3. 5 months, ( August) call if you need me before Wellness with Nurse due in July please schedule  Please STOP metformin, and also commit to 64 ounces water daily  New for your diabetes is glipizide 2.5 mg every morning and I have also added tradgenta 5 mg one daily today  Please test and record fasting blood sugar every morning, goal is 100 to 140  Please eat on a regular schedule, and commit to 30 minutes  exercise inside your home every day  Thankful that back pain is 100 % better with epidural injection  Please get fasting lipid, cmp and EGFR last week in May, please drink water the day that you get labs done [facility])  Non fasting HBa1C, chem 7 and EGFr5 days before your August visit [facility])  Social distancing.Please limit the number of times you leave your home, and the number of people you come into contact with Wear face mask when you do go out Frequent hand washing with soap and water Keeping your hands off of your face. These 4 practices will help to keep both you and your community healthy during this time. Please practice them faithfully!  Thanks for choosing [facility], we consider it a privelige to serve you.

## 2019-01-25 NOTE — Assessment & Plan Note (Signed)
Marked deterioration due to recent epidural. Discontinue metformin, add tradgenta and continue ow dose glipizide Updated lab needed at/ before next visit. Mikayla Jenkins is reminded of the importance of commitment to daily physical activity for 30 minutes or more, as able and the need to limit carbohydrate intake to 30 to 60 grams per meal to help with blood sugar control.   The need to take medication as prescribed, test blood sugar as directed, and to call between visits if there is a concern that blood sugar is uncontrolled is also discussed.   Mikayla Jenkins is reminded of the importance of daily foot exam, annual eye examination, and good blood sugar, blood pressure and cholesterol control.  Diabetic Labs Latest Ref Rng & Units 01/17/2019 08/21/2018 05/16/2018 05/08/2018 01/05/2018  HbA1c <5.7 % of total Hgb 8.4(H) 7.0(H) 6.9(H) - 6.9(H)  Microalbumin mg/dL - - 0.4 - -  Micro/Creat Ratio <30 mcg/mg creat - - - - -  Chol <200 mg/dL - 185 - - 183  HDL >50 mg/dL - 90 - - 90  Calc LDL mg/dL (calc) - 81 - - 80  Triglycerides <150 mg/dL - 68 - - 58  Creatinine 0.60 - 0.93 mg/dL 1.58(H) 1.27(H) 1.30(H) 1.47(H) 1.05(H)   BP/Weight 01/25/2019 11/29/2018 11/08/2018 10/19/2018 08/14/2018 05/16/2018 07/16/2445  Systolic BP 286 381 771 165 790 383 338  Diastolic BP 72 65 79 80 78 80 84  Wt. (Lbs) 151 - - 150.12 149 152 151  BMI 22.96 - - 22.83 22.99 23.81 23.65   Foot/eye exam completion dates Latest Ref Rng & Units 10/19/2018 08/01/2018  Eye Exam No Retinopathy - No Retinopathy  Foot Form Completion - Done -

## 2019-01-25 NOTE — Assessment & Plan Note (Signed)
Controlled, no change in medication DASH diet and commitment to daily physical activity for a minimum of 30 minutes discussed and encouraged, as a part of hypertension management. The importance of attaining a healthy weight is also discussed.  BP/Weight 01/25/2019 11/29/2018 11/08/2018 10/19/2018 08/14/2018 05/16/2018 3/35/4562  Systolic BP 563 893 734 287 681 157 262  Diastolic BP 72 65 79 80 78 80 84  Wt. (Lbs) 151 - - 150.12 149 152 151  BMI 22.96 - - 22.83 22.99 23.81 23.65

## 2019-01-25 NOTE — Assessment & Plan Note (Signed)
Hyperlipidemia:Low fat diet discussed and encouraged.   Lipid Panel  Lab Results  Component Value Date   CHOL 185 08/21/2018   HDL 90 08/21/2018   LDLCALC 81 08/21/2018   TRIG 68 08/21/2018   CHOLHDL 2.1 08/21/2018   Updated lab needed at/ before next visit.

## 2019-03-16 DIAGNOSIS — E785 Hyperlipidemia, unspecified: Secondary | ICD-10-CM | POA: Diagnosis not present

## 2019-03-16 DIAGNOSIS — N184 Chronic kidney disease, stage 4 (severe): Secondary | ICD-10-CM | POA: Diagnosis not present

## 2019-03-16 DIAGNOSIS — E1365 Other specified diabetes mellitus with hyperglycemia: Secondary | ICD-10-CM | POA: Diagnosis not present

## 2019-03-16 DIAGNOSIS — E1322 Other specified diabetes mellitus with diabetic chronic kidney disease: Secondary | ICD-10-CM | POA: Diagnosis not present

## 2019-03-17 ENCOUNTER — Encounter: Payer: Self-pay | Admitting: Family Medicine

## 2019-03-17 LAB — COMPLETE METABOLIC PANEL WITH GFR
AG Ratio: 2.1 (calc) (ref 1.0–2.5)
ALT: 11 U/L (ref 6–29)
AST: 16 U/L (ref 10–35)
Albumin: 4.4 g/dL (ref 3.6–5.1)
Alkaline phosphatase (APISO): 66 U/L (ref 37–153)
BUN/Creatinine Ratio: 20 (calc) (ref 6–22)
BUN: 27 mg/dL — ABNORMAL HIGH (ref 7–25)
CO2: 25 mmol/L (ref 20–32)
Calcium: 10.1 mg/dL (ref 8.6–10.4)
Chloride: 102 mmol/L (ref 98–110)
Creat: 1.33 mg/dL — ABNORMAL HIGH (ref 0.60–0.93)
GFR, Est African American: 46 mL/min/{1.73_m2} — ABNORMAL LOW (ref >=60)
GFR, Est Non African American: 40 mL/min/{1.73_m2} — ABNORMAL LOW (ref >=60)
Globulin: 2.1 g/dL (calc) (ref 1.9–3.7)
Glucose, Bld: 134 mg/dL — ABNORMAL HIGH (ref 65–99)
Potassium: 4.4 mmol/L (ref 3.5–5.3)
Sodium: 138 mmol/L (ref 135–146)
Total Bilirubin: 0.8 mg/dL (ref 0.2–1.2)
Total Protein: 6.5 g/dL (ref 6.1–8.1)

## 2019-03-17 LAB — LIPID PANEL
Cholesterol: 192 mg/dL (ref <200)
HDL: 97 mg/dL (ref >=50)
LDL Cholesterol (Calc): 81 mg/dL (calc)
Non-HDL Cholesterol (Calc): 95 mg/dL (calc) (ref <130)
Total CHOL/HDL Ratio: 2 (calc) (ref <5.0)
Triglycerides: 63 mg/dL (ref <150)

## 2019-04-18 ENCOUNTER — Other Ambulatory Visit: Payer: Self-pay

## 2019-04-18 MED ORDER — GLIPIZIDE ER 2.5 MG PO TB24: 2.5000 mg | ORAL_TABLET | Freq: Every day | ORAL | 3 refills | Status: DC

## 2019-05-07 ENCOUNTER — Other Ambulatory Visit: Payer: Self-pay | Admitting: Family Medicine

## 2019-05-14 ENCOUNTER — Ambulatory Visit (INDEPENDENT_AMBULATORY_CARE_PROVIDER_SITE_OTHER): Payer: Medicare HMO | Admitting: Family Medicine

## 2019-05-14 ENCOUNTER — Other Ambulatory Visit: Payer: Self-pay

## 2019-05-14 ENCOUNTER — Encounter: Payer: Self-pay | Admitting: Family Medicine

## 2019-05-14 VITALS — BP 126/70 | HR 60 | Resp 12 | Ht 67.5 in | Wt 159.0 lb

## 2019-05-14 DIAGNOSIS — Z1231 Encounter for screening mammogram for malignant neoplasm of breast: Secondary | ICD-10-CM | POA: Diagnosis not present

## 2019-05-14 DIAGNOSIS — N184 Chronic kidney disease, stage 4 (severe): Secondary | ICD-10-CM | POA: Diagnosis not present

## 2019-05-14 DIAGNOSIS — E559 Vitamin D deficiency, unspecified: Secondary | ICD-10-CM

## 2019-05-14 DIAGNOSIS — E1122 Type 2 diabetes mellitus with diabetic chronic kidney disease: Secondary | ICD-10-CM

## 2019-05-14 DIAGNOSIS — E785 Hyperlipidemia, unspecified: Secondary | ICD-10-CM

## 2019-05-14 DIAGNOSIS — I129 Hypertensive chronic kidney disease with stage 1 through stage 4 chronic kidney disease, or unspecified chronic kidney disease: Secondary | ICD-10-CM | POA: Diagnosis not present

## 2019-05-14 DIAGNOSIS — I10 Essential (primary) hypertension: Secondary | ICD-10-CM | POA: Diagnosis not present

## 2019-05-14 NOTE — Assessment & Plan Note (Signed)
Hyperlipidemia:Low fat diet discussed and encouraged.   Lipid Panel  Lab Results  Component Value Date   CHOL 192 03/16/2019   HDL 97 03/16/2019   LDLCALC 81 03/16/2019   TRIG 63 03/16/2019   CHOLHDL 2.0 03/16/2019   Controlled, no change in medication

## 2019-05-14 NOTE — Patient Instructions (Signed)
Annual exam with MD Jan 3 or after, call if you need me sooner please  Please schedule mammogram at checkout due in December  Please call and come for your flu vaccine in September  Keep wellness this week  New lab order for end August ( 3rd or 4th week) draw, HBA1C, CBC, chem 7 and EGFR, Vit D and microalb, I will message you with the results  It is important that you exercise regularly at least 30 minutes 5 times a week. If you develop chest pain, have severe difficulty breathing, or feel very tired, stop exercising immediately and seek medical attention

## 2019-05-14 NOTE — Assessment & Plan Note (Signed)
Controlled, no change in medication DASH diet and commitment to daily physical activity for a minimum of 30 minutes discussed and encouraged, as a part of hypertension management. The importance of attaining a healthy weight is also discussed.  BP/Weight 05/14/2019 01/25/2019 11/29/2018 11/08/2018 10/19/2018 08/14/2018 4/45/8483  Systolic BP 507 573 225 672 091 980 221  Diastolic BP 70 72 65 79 80 78 80  Wt. (Lbs) 159 151 - - 150.12 149 152  BMI 24.54 22.96 - - 22.83 22.99 23.81

## 2019-05-14 NOTE — Assessment & Plan Note (Signed)
Mikayla Jenkins is reminded of the importance of commitment to daily physical activity for 30 minutes or more, as able and the need to limit carbohydrate intake to 30 to 60 grams per meal to help with blood sugar control.   The need to take medication as prescribed, test blood sugar as directed, and to call between visits if there is a concern that blood sugar is uncontrolled is also discussed.   Mikayla Jenkins is reminded of the importance of daily foot exam, annual eye examination, and good blood sugar, blood pressure and cholesterol control. Uncontrolled , updated lab inn August  Diabetic Labs Latest Ref Rng & Units 03/16/2019 01/17/2019 08/21/2018 05/16/2018 05/08/2018  HbA1c <5.7 % of total Hgb - 8.4(H) 7.0(H) 6.9(H) -  Microalbumin mg/dL - - - 0.4 -  Micro/Creat Ratio <30 mcg/mg creat - - - - -  Chol <200 mg/dL 192 - 185 - -  HDL > OR = 50 mg/dL 97 - 90 - -  Calc LDL mg/dL (calc) 81 - 81 - -  Triglycerides <150 mg/dL 63 - 68 - -  Creatinine 0.60 - 0.93 mg/dL 1.33(H) 1.58(H) 1.27(H) 1.30(H) 1.47(H)   BP/Weight 05/14/2019 01/25/2019 11/29/2018 11/08/2018 10/19/2018 08/14/2018 5/36/6440  Systolic BP 347 425 956 387 564 332 951  Diastolic BP 70 72 65 79 80 78 80  Wt. (Lbs) 159 151 - - 150.12 149 152  BMI 24.54 22.96 - - 22.83 22.99 23.81   Foot/eye exam completion dates Latest Ref Rng & Units 10/19/2018 08/01/2018  Eye Exam No Retinopathy - No Retinopathy  Foot Form Completion - Done -

## 2019-05-14 NOTE — Progress Notes (Signed)
Mikayla Jenkins     MRN: [id number]      DOB: 10-07-1946   HPI Mikayla Jenkins is here for follow up and re-evaluation of chronic medical conditions, medication management and review of any available recent lab and radiology data.  Preventive health is updated, specifically  Cancer screening and Immunization.   Questions or concerns regarding consultations or procedures which the PT has had in the interim are  Addressed.Reports continued success with epidural for pain management The PT denies any adverse reactions to current medications since the last visit.  Not exercising as regularly as in the pas in the past 1 month , will resume .Denies polyuria, polydipsia, blurred vision , or hypoglycemic episodes.    ROS Denies recent fever or chills. Denies sinus pressure, nasal congestion, ear pain or sore throat. Denies chest congestion, productive cough or wheezing. Denies chest pains, palpitations and leg swelling Denies abdominal pain, nausea, vomiting,diarrhea or constipation.   Denies dysuria, frequency, hesitancy or incontinence. Denies uncontrolled  joint pain, swelling and limitation in mobility. Denies headaches, seizures, numbness, or tingling. Denies depression, anxiety or insomnia. Denies skin break down or rash.   PE  BP 126/70   Pulse 60   Resp 12   Ht 5' 7.5" (1.715 m)   Wt 159 lb (72.1 kg)   SpO2 97%   BMI 24.54 kg/m   Patient alert and oriented and in no cardiopulmonary distress.  HEENT: No facial asymmetry, EOMI,   oropharynx pink and moist.  Neck supple no JVD, no mass.  Chest: Clear to auscultation bilaterally.  CVS: S1, S2 no murmurs, no S3.Regular rate.  ABD: Soft non tender.   Ext: No edema  MS: Adequate though reduced  ROM spine, shoulders, hips and knees.  Skin: Intact, no ulcerations or rash noted.  Psych: Good eye contact, normal affect. Memory intact not anxious or depressed appearing.  CNS: CN 2-12 intact, power,  normal throughout.no focal  deficits noted.   Assessment & Plan  Malignant hypertension Controlled, no change in medication DASH diet and commitment to daily physical activity for a minimum of 30 minutes discussed and encouraged, as a part of hypertension management. The importance of attaining a healthy weight is also discussed.  BP/Weight 05/14/2019 01/25/2019 11/29/2018 11/08/2018 10/19/2018 08/14/2018 7/65/4650  Systolic BP 354 656 812 751 700 174 944  Diastolic BP 70 72 65 79 80 78 80  Wt. (Lbs) 159 151 - - 150.12 149 152  BMI 24.54 22.96 - - 22.83 22.99 23.81       Hyperlipidemia LDL goal <100 Hyperlipidemia:Low fat diet discussed and encouraged.   Lipid Panel  Lab Results  Component Value Date   CHOL 192 03/16/2019   HDL 97 03/16/2019   LDLCALC 81 03/16/2019   TRIG 63 03/16/2019   CHOLHDL 2.0 03/16/2019   Controlled, no change in medication     Type 2 DM with CKD stage 4 and hypertension ([place]) Mikayla Jenkins is reminded of the importance of commitment to daily physical activity for 30 minutes or more, as able and the need to limit carbohydrate intake to 30 to 60 grams per meal to help with blood sugar control.   The need to take medication as prescribed, test blood sugar as directed, and to call between visits if there is a concern that blood sugar is uncontrolled is also discussed.   Mikayla Jenkins is reminded of the importance of daily foot exam, annual eye examination, and good blood sugar, blood pressure and cholesterol control.  Uncontrolled , updated lab inn August  Diabetic Labs Latest Ref Rng & Units 03/16/2019 01/17/2019 08/21/2018 05/16/2018 05/08/2018  HbA1c <5.7 % of total Hgb - 8.4(H) 7.0(H) 6.9(H) -  Microalbumin mg/dL - - - 0.4 -  Micro/Creat Ratio <30 mcg/mg creat - - - - -  Chol <200 mg/dL 192 - 185 - -  HDL > OR = 50 mg/dL 97 - 90 - -  Calc LDL mg/dL (calc) 81 - 81 - -  Triglycerides <150 mg/dL 63 - 68 - -  Creatinine 0.60 - 0.93 mg/dL 1.33(H) 1.58(H) 1.27(H) 1.30(H) 1.47(H)   BP/Weight  05/14/2019 01/25/2019 11/29/2018 11/08/2018 10/19/2018 08/14/2018 5/99/3570  Systolic BP 177 939 030 092 330 076 226  Diastolic BP 70 72 65 79 80 78 80  Wt. (Lbs) 159 151 - - 150.12 149 152  BMI 24.54 22.96 - - 22.83 22.99 23.81   Foot/eye exam completion dates Latest Ref Rng & Units 10/19/2018 08/01/2018  Eye Exam No Retinopathy - No Retinopathy  Foot Form Completion - Done -

## 2019-05-18 ENCOUNTER — Ambulatory Visit: Payer: Medicare HMO | Admitting: Family Medicine

## 2019-05-21 ENCOUNTER — Ambulatory Visit: Payer: Medicare HMO

## 2019-05-22 ENCOUNTER — Ambulatory Visit (INDEPENDENT_AMBULATORY_CARE_PROVIDER_SITE_OTHER): Payer: Medicare HMO | Admitting: Family Medicine

## 2019-05-22 ENCOUNTER — Other Ambulatory Visit: Payer: Self-pay

## 2019-05-22 ENCOUNTER — Encounter: Payer: Self-pay | Admitting: Family Medicine

## 2019-05-22 VITALS — BP 118/64 | HR 61 | Temp 97.8°F | Resp 12 | Ht 67.5 in | Wt 159.0 lb

## 2019-05-22 DIAGNOSIS — Z0000 Encounter for general adult medical examination without abnormal findings: Secondary | ICD-10-CM | POA: Diagnosis not present

## 2019-05-22 DIAGNOSIS — E1121 Type 2 diabetes mellitus with diabetic nephropathy: Secondary | ICD-10-CM | POA: Diagnosis not present

## 2019-05-22 MED ORDER — ACCU-CHEK AVIVA PLUS VI STRP: ORAL_STRIP | 1 refills | Status: DC

## 2019-05-22 NOTE — Progress Notes (Signed)
Subjective:   Mikayla Jenkins is a 73 y.o. female who presents for Medicare Annual (Subsequent) preventive examination.  Review of Systems:    Cardiac Risk Factors include: advanced age (>37mn, >>76women);diabetes mellitus;dyslipidemia;hypertension     Objective:     Vitals: BP 118/64   Pulse 61   Temp 97.8 F (36.6 C) (Oral)   Resp 12   Ht 5' 7.5" (1.715 m)   Wt 159 lb (72.1 kg)   SpO2 98%   BMI 24.54 kg/m   Body mass index is 24.54 kg/m.  Advanced Directives 05/16/2018 05/02/2017 06/13/2015  Does Patient Have a Medical Advance Directive? Yes No No  Does patient want to make changes to medical advance directive? Yes (ED - Information included in AVS) - -  Would patient like information on creating a medical advance directive? - Yes (MAU/Ambulatory/Procedural Areas - Information given) No - patient declined information    Tobacco Social History   Tobacco Use  Smoking Status Never Smoker  Smokeless Tobacco Never Used     Counseling given: Yes   Clinical Intake:  Pre-visit preparation completed: Yes  Pain : No/denies pain Pain Score: 0-No pain     BMI - recorded: 24.54 Nutritional Status: BMI of 19-24  Normal Nutritional Risks: None Diabetes: Yes CBG done?: No Did pt. bring in CBG monitor from home?: No  How often do you need to have someone help you when you read instructions, pamphlets, or other written materials from your doctor or pharmacy?: 1 - Never What is the last grade level you completed in school?: 12  Interpreter Needed?: No     Past Medical History:  Diagnosis Date  . Diabetes mellitus type II   . Hyperlipidemia   . Hypertension    Past Surgical History:  Procedure Laterality Date  . ABDOMINAL HYSTERECTOMY    . COLONOSCOPY N/A 06/13/2015   Procedure: COLONOSCOPY;  Surgeon: NRogene Houston MD;  Location: [facility];  Service: Endoscopy;  Laterality: N/A;  930-rescheduled 8/26 @ 8:30am Ann to notify pt  . HERNIA REPAIR    . inguinal  herniorrhapy right    . right eye surgery secondary to right eye weakness    . SALPINGOOPHORECTOMY    . TUBAL LIGATION     Family History  Problem Relation Age of Onset  . Pneumonia Brother        on continuous O2  . Stroke Brother   . Stroke Sister   . COPD Sister   . Diabetes Sister   . Stroke Sister   . Hypertension Mother   . Diabetes Mother   . Fibroids Daughter        uterine   Social History   Socioeconomic History  . Marital status: Married    Spouse name: Not on file  . Number of children: Not on file  . Years of education: Not on file  . Highest education level: High school graduate  Occupational History  . Not on file  Social Needs  . Financial resource strain: Not hard at all  . Food insecurity    Worry: Never true    Inability: Never true  . Transportation needs    Medical: No    Non-medical: No  Tobacco Use  . Smoking status: Never Smoker  . Smokeless tobacco: Never Used  Substance and Sexual Activity  . Alcohol use: No  . Drug use: No  . Sexual activity: Yes    Birth control/protection: Surgical  Lifestyle  . Physical  activity    Days per week: 4 days    Minutes per session: 50 min  . Stress: Not at all  Relationships  . Social connections    Talks on phone: More than three times a week    Gets together: More than three times a week    Attends religious service: More than 4 times per year    Active member of club or organization: Yes    Attends meetings of clubs or organizations: More than 4 times per year    Relationship status: Married  Other Topics Concern  . Not on file  Social History Narrative  . Not on file    Outpatient Encounter Medications as of 05/22/2019  Medication Sig  . ACCU-CHEK AVIVA PLUS test strip TEST BLOOD SUGAR UP TO FOUR TIMES DAILY AS DIRECTED  . amLODipine (NORVASC) 10 MG tablet TAKE 1 TABLET EVERY DAY  . aspirin (ASPIRIN LOW DOSE) 81 MG EC tablet Take 81 mg by mouth daily. Take 1 tablet by mouth once a day   .  atenolol-chlorthalidone (TENORETIC) 100-25 MG tablet Take 1 tablet by mouth daily.  . benazepril (LOTENSIN) 40 MG tablet TAKE 1 TABLET EVERY DAY  . blood glucose meter kit and supplies Dispense based on patient and insurance preference. Use up to four times daily as directed. (FOR ICD-10 E10.9, E11.9).  . Calcium Carbonate-Vitamin D (CALCIUM 600/VITAMIN D) 600-400 MG-UNIT per tablet Take 1 tablet by mouth daily.  . cloNIDine (CATAPRES) 0.3 MG tablet TAKE 1 AND 1/2 TABLETS AT BEDTIME  . glipiZIDE (GLUCOTROL XL) 2.5 MG 24 hr tablet Take 1 tablet (2.5 mg total) by mouth daily with breakfast.  . HYDROcodone-acetaminophen (NORCO/VICODIN) 5-325 MG tablet Take one tablet at bedtime, as needed, for back and right leg pain  . linagliptin (TRADJENTA) 5 MG TABS tablet Take 1 tablet (5 mg total) by mouth daily.  Marland Kitchen lovastatin (MEVACOR) 40 MG tablet TAKE 1 TABLET EVERY DAY  . Multiple Vitamin (MULTIVITAMIN) tablet Take 1 tablet by mouth daily.  Marland Kitchen spironolactone (ALDACTONE) 25 MG tablet Take 1 tablet (25 mg total) by mouth daily.   No facility-administered encounter medications on file as of 05/22/2019.     Activities of Daily Living In your present state of health, do you have any difficulty performing the following activities: 05/22/2019  Hearing? N  Vision? N  Difficulty concentrating or making decisions? N  Walking or climbing stairs? Y  Comment climbing stairs  Dressing or bathing? N  Doing errands, shopping? N  Preparing Food and eating ? N  Using the Toilet? N  In the past six months, have you accidently leaked urine? N  Do you have problems with loss of bowel control? N  Managing your Medications? N  Managing your Finances? N  Housekeeping or managing your Housekeeping? N  Some recent data might be hidden    Patient Care Team: Fayrene Helper, MD as PCP - General Herminio Commons, MD as PCP - Cardiology (Cardiology)    Assessment:   This is a routine wellness examination for [facility].   Exercise Activities and Dietary recommendations Current Exercise Habits: Home exercise routine, Type of exercise: walking, Time (Minutes): 30, Frequency (Times/Week): 4, Weekly Exercise (Minutes/Week): 120, Intensity: Mild, Exercise limited by: None identified  Goals    . Have 3 meals a day       Fall Risk Fall Risk  05/22/2019 05/14/2019 01/25/2019 08/14/2018 05/16/2018  Falls in the past year? 0 0 0 No No  Number  falls in past yr: - - 0 - -  Injury with Fall? 0 0 0 - -   Is the patient's home free of loose throw rugs in walkways, pet beds, electrical cords, etc?   yes      Grab bars in the bathroom? no      Handrails on the stairs?   no stairs      Adequate lighting?   yes    Depression Screen PHQ 2/9 Scores 05/22/2019 05/14/2019 01/25/2019 08/14/2018  PHQ - 2 Score 0 0 0 0  PHQ- 9 Score - - - -     Cognitive Function     6CIT Screen 05/22/2019 05/16/2018 05/02/2017  What Year? 0 points 0 points 0 points  What month? 0 points 0 points 0 points  What time? 0 points 0 points 0 points  Count back from 20 0 points 0 points 0 points  Months in reverse 0 points 0 points 0 points  Repeat phrase 0 points 2 points 0 points  Total Score 0 2 0    Immunization History  Administered Date(s) Administered  . Influenza Split 08/11/2011, 08/23/2012  . Influenza, High Dose Seasonal PF 08/14/2018  . Influenza,inj,Quad PF,6+ Mos 08/10/2013, 08/10/2014, 06/30/2015, 07/12/2016, 08/02/2017  . Pneumococcal Conjugate-13 05/07/2014  . Pneumococcal Polysaccharide-23 09/11/2007, 08/11/2011  . Tdap 04/15/2011    Qualifies for Shingles Vaccine? Got at pharm, unsure of year   Screening Tests Health Maintenance  Topic Date Due  . INFLUENZA VACCINE  05/19/2019  . HEMOGLOBIN A1C  07/19/2019  . OPHTHALMOLOGY EXAM  08/02/2019  . FOOT EXAM  10/21/2019  . MAMMOGRAM  09/21/2020  . TETANUS/TDAP  04/14/2021  . COLONOSCOPY  06/12/2025  . DEXA SCAN  Completed  . Hepatitis C Screening  Completed  . PNA vac  Low Risk Adult  Completed    Cancer Screenings: Lung: Low Dose CT Chest recommended if Age 69-80 years, 30 pack-year currently smoking OR have quit w/in 15years. Patient does not qualify. Breast:  Up to date on Mammogram? Yes   Up to date of Bone Density/Dexa? Yes Colorectal:  Due 2026  Additional Screenings:   Hepatitis C Screening: Completed     Plan:      1. Encounter for Medicare annual wellness exam  I have personally reviewed and noted the following in the patient's chart:   . Medical and social history . Use of alcohol, tobacco or illicit drugs  . Current medications and supplements . Functional ability and status . Nutritional status . Physical activity . Advanced directives . List of other physicians . Hospitalizations, surgeries, and ER visits in previous 12 months . Vitals . Screenings to include cognitive, depression, and falls . Referrals and appointments  In addition, I have reviewed and discussed with patient certain preventive protocols, quality metrics, and best practice recommendations. A written personalized care plan for preventive services as well as general preventive health recommendations were provided to patient.     Perlie Mayo, NP  05/22/2019

## 2019-05-22 NOTE — Patient Instructions (Signed)
Mikayla Jenkins , Thank you for taking time to come for your Medicare Wellness Visit. I appreciate your ongoing commitment to your health goals. Please review the following plan we discussed and let me know if I can assist you in the future.   Please continue to practice social distancing to keep you, your family, and our community safe.  If you must go out, please wear a Mask and practice good handwashing.  Screening recommendations/referrals: Colonoscopy: Due 2026 Mammogram: Dec 2020 Bone Density: Completed Recommended yearly ophthalmology/optometry visit for glaucoma screening and checkup Recommended yearly dental visit for hygiene and checkup  Vaccinations: Influenza vaccine: Due Fall 2020 Pneumococcal vaccine: Completed Tdap vaccine: Due 2022 Shingles vaccine: [place] Shingles  Advanced directives: You declined this today  Conditions/risks identified: Falls   Next appointment: 10/23/2019  Preventive Care 74 Years and Older, Female Preventive care refers to lifestyle choices and visits with your health care provider that can promote health and wellness. What does preventive care include?  A yearly physical exam. This is also called an annual well check.  Dental exams once or twice a year.  Routine eye exams. Ask your health care provider how often you should have your eyes checked.  Personal lifestyle choices, including:  Daily care of your teeth and gums.  Regular physical activity.  Eating a healthy diet.  Avoiding tobacco and drug use.  Limiting alcohol use.  Practicing safe sex.  Taking low-dose aspirin every day.  Taking vitamin and mineral supplements as recommended by your health care provider. What happens during an annual well check? The services and screenings done by your health care provider during your annual well check will depend on your age, overall health, lifestyle risk factors, and family history of disease. Counseling  Your health care provider may  ask you questions about your:  Alcohol use.  Tobacco use.  Drug use.  Emotional well-being.  Home and relationship well-being.  Sexual activity.  Eating habits.  History of falls.  Memory and ability to understand (cognition).  Work and work Statistician.  Reproductive health. Screening  You may have the following tests or measurements:  Height, weight, and BMI.  Blood pressure.  Lipid and cholesterol levels. These may be checked every 5 years, or more frequently if you are over 42 years old.  Skin check.  Lung cancer screening. You may have this screening every year starting at age 74 if you have a 30-pack-year history of smoking and currently smoke or have quit within the past 15 years.  Fecal occult blood test (FOBT) of the stool. You may have this test every year starting at age 74.  Flexible sigmoidoscopy or colonoscopy. You may have a sigmoidoscopy every 5 years or a colonoscopy every 10 years starting at age 74.  Hepatitis C blood test.  Hepatitis B blood test.  Sexually transmitted disease (STD) testing.  Diabetes screening. This is done by checking your blood sugar (glucose) after you have not eaten for a while (fasting). You may have this done every 1-3 years.  Bone density scan. This is done to screen for osteoporosis. You may have this done starting at age 50.  Mammogram. This may be done every 1-2 years. Talk to your health care provider about how often you should have regular mammograms. Talk with your health care provider about your test results, treatment options, and if necessary, the need for more tests. Vaccines  Your health care provider may recommend certain vaccines, such as:  Influenza vaccine. This is recommended  every year.  Tetanus, diphtheria, and acellular pertussis (Tdap, Td) vaccine. You may need a Td booster every 10 years.  Zoster vaccine. You may need this after age 74.  Pneumococcal 13-valent conjugate (PCV13) vaccine. One  dose is recommended after age 56.  Pneumococcal polysaccharide (PPSV23) vaccine. One dose is recommended after age 74. Talk to your health care provider about which screenings and vaccines you need and how often you need them. This information is not intended to replace advice given to you by your health care provider. Make sure you discuss any questions you have with your health care provider. Document Released: 10/31/2015 Document Revised: 06/23/2016 Document Reviewed: 08/05/2015 Elsevier Interactive Patient Education  2017 [place] Prevention in the Home Falls can cause injuries. They can happen to people of all ages. There are many things you can do to make your home safe and to help prevent falls. What can I do on the outside of my home?  Regularly fix the edges of walkways and driveways and fix any cracks.  Remove anything that might make you trip as you walk through a door, such as a raised step or threshold.  Trim any bushes or trees on the path to your home.  Use bright outdoor lighting.  Clear any walking paths of anything that might make someone trip, such as rocks or tools.  Regularly check to see if handrails are loose or broken. Make sure that both sides of any steps have handrails.  Any raised decks and porches should have guardrails on the edges.  Have any leaves, snow, or ice cleared regularly.  Use sand or salt on walking paths during winter.  Clean up any spills in your garage right away. This includes oil or grease spills. What can I do in the bathroom?  Use night lights.  Install grab bars by the toilet and in the tub and shower. Do not use towel bars as grab bars.  Use non-skid mats or decals in the tub or shower.  If you need to sit down in the shower, use a plastic, non-slip stool.  Keep the floor dry. Clean up any water that spills on the floor as soon as it happens.  Remove soap buildup in the tub or shower regularly.  Attach bath  mats securely with double-sided non-slip rug tape.  Do not have throw rugs and other things on the floor that can make you trip. What can I do in the bedroom?  Use night lights.  Make sure that you have a light by your bed that is easy to reach.  Do not use any sheets or blankets that are too big for your bed. They should not hang down onto the floor.  Have a firm chair that has side arms. You can use this for support while you get dressed.  Do not have throw rugs and other things on the floor that can make you trip. What can I do in the kitchen?  Clean up any spills right away.  Avoid walking on wet floors.  Keep items that you use a lot in easy-to-reach places.  If you need to reach something above you, use a strong step stool that has a grab bar.  Keep electrical cords out of the way.  Do not use floor polish or wax that makes floors slippery. If you must use wax, use non-skid floor wax.  Do not have throw rugs and other things on the floor that can make you trip. What  can I do with my stairs?  Do not leave any items on the stairs.  Make sure that there are handrails on both sides of the stairs and use them. Fix handrails that are broken or loose. Make sure that handrails are as long as the stairways.  Check any carpeting to make sure that it is firmly attached to the stairs. Fix any carpet that is loose or worn.  Avoid having throw rugs at the top or bottom of the stairs. If you do have throw rugs, attach them to the floor with carpet tape.  Make sure that you have a light switch at the top of the stairs and the bottom of the stairs. If you do not have them, ask someone to add them for you. What else can I do to help prevent falls?  Wear shoes that:  Do not have high heels.  Have rubber bottoms.  Are comfortable and fit you well.  Are closed at the toe. Do not wear sandals.  If you use a stepladder:  Make sure that it is fully opened. Do not climb a closed  stepladder.  Make sure that both sides of the stepladder are locked into place.  Ask someone to hold it for you, if possible.  Clearly mark and make sure that you can see:  Any grab bars or handrails.  First and last steps.  Where the edge of each step is.  Use tools that help you move around (mobility aids) if they are needed. These include:  Canes.  Walkers.  Scooters.  Crutches.  Turn on the lights when you go into a dark area. Replace any light bulbs as soon as they burn out.  Set up your furniture so you have a clear path. Avoid moving your furniture around.  If any of your floors are uneven, fix them.  If there are any pets around you, be aware of where they are.  Review your medicines with your doctor. Some medicines can make you feel dizzy. This can increase your chance of falling. Ask your doctor what other things that you can do to help prevent falls. This information is not intended to replace advice given to you by your health care provider. Make sure you discuss any questions you have with your health care provider. Document Released: 07/31/2009 Document Revised: 03/11/2016 Document Reviewed: 11/08/2014 Elsevier Interactive Patient Education  2017 [employer].

## 2019-05-28 ENCOUNTER — Other Ambulatory Visit: Payer: Self-pay

## 2019-05-28 MED ORDER — LINAGLIPTIN 5 MG PO TABS: 5.0000 mg | ORAL_TABLET | Freq: Every day | ORAL | 1 refills | Status: DC

## 2019-06-06 ENCOUNTER — Other Ambulatory Visit: Payer: Self-pay | Admitting: Family Medicine

## 2019-06-06 DIAGNOSIS — E785 Hyperlipidemia, unspecified: Secondary | ICD-10-CM

## 2019-06-06 DIAGNOSIS — I10 Essential (primary) hypertension: Secondary | ICD-10-CM

## 2019-06-14 ENCOUNTER — Other Ambulatory Visit: Payer: Self-pay

## 2019-06-14 DIAGNOSIS — E559 Vitamin D deficiency, unspecified: Secondary | ICD-10-CM | POA: Diagnosis not present

## 2019-06-14 DIAGNOSIS — I129 Hypertensive chronic kidney disease with stage 1 through stage 4 chronic kidney disease, or unspecified chronic kidney disease: Secondary | ICD-10-CM | POA: Diagnosis not present

## 2019-06-14 DIAGNOSIS — E1122 Type 2 diabetes mellitus with diabetic chronic kidney disease: Secondary | ICD-10-CM | POA: Diagnosis not present

## 2019-06-14 DIAGNOSIS — I10 Essential (primary) hypertension: Secondary | ICD-10-CM | POA: Diagnosis not present

## 2019-06-14 DIAGNOSIS — N184 Chronic kidney disease, stage 4 (severe): Secondary | ICD-10-CM | POA: Diagnosis not present

## 2019-06-14 MED ORDER — SPIRONOLACTONE 25 MG PO TABS: 25.0000 mg | ORAL_TABLET | Freq: Every day | ORAL | 3 refills | Status: DC

## 2019-06-15 LAB — CBC
HCT: 33.1 % — ABNORMAL LOW (ref 35.0–45.0)
Hemoglobin: 11.2 g/dL — ABNORMAL LOW (ref 11.7–15.5)
MCH: 31.8 pg (ref 27.0–33.0)
MCHC: 33.8 g/dL (ref 32.0–36.0)
MCV: 94 fL (ref 80.0–100.0)
MPV: 11 fL (ref 7.5–12.5)
Platelets: 211 10*3/uL (ref 140–400)
RBC: 3.52 10*6/uL — ABNORMAL LOW (ref 3.80–5.10)
RDW: 11.9 % (ref 11.0–15.0)
WBC: 5.4 10*3/uL (ref 3.8–10.8)

## 2019-06-15 LAB — BASIC METABOLIC PANEL WITH GFR
BUN/Creatinine Ratio: 23 (calc) — ABNORMAL HIGH (ref 6–22)
BUN: 39 mg/dL — ABNORMAL HIGH (ref 7–25)
CO2: 27 mmol/L (ref 20–32)
Calcium: 9.8 mg/dL (ref 8.6–10.4)
Chloride: 105 mmol/L (ref 98–110)
Creat: 1.73 mg/dL — ABNORMAL HIGH (ref 0.60–0.93)
GFR, Est African American: 33 mL/min/{1.73_m2} — ABNORMAL LOW (ref >=60)
GFR, Est Non African American: 29 mL/min/{1.73_m2} — ABNORMAL LOW (ref >=60)
Glucose, Bld: 133 mg/dL — ABNORMAL HIGH (ref 65–99)
Potassium: 4.6 mmol/L (ref 3.5–5.3)
Sodium: 138 mmol/L (ref 135–146)

## 2019-06-15 LAB — MICROALBUMIN, URINE: Microalb, Ur: <0.2 mg/dL

## 2019-06-15 LAB — HEMOGLOBIN A1C
Hgb A1c MFr Bld: 6.2 % of total Hgb — ABNORMAL HIGH (ref <5.7)
Mean Plasma Glucose: 131 (calc)
eAG (mmol/L): 7.3 (calc)

## 2019-06-15 LAB — VITAMIN D 25 HYDROXY (VIT D DEFICIENCY, FRACTURES): Vit D, 25-Hydroxy: 45 ng/mL (ref 30–100)

## 2019-06-20 ENCOUNTER — Other Ambulatory Visit: Payer: Self-pay | Admitting: Family Medicine

## 2019-06-20 MED ORDER — GLIPIZIDE ER 2.5 MG PO TB24: ORAL_TABLET | ORAL | 1 refills | Status: DC

## 2019-06-21 ENCOUNTER — Telehealth: Payer: Self-pay

## 2019-06-21 DIAGNOSIS — I10 Essential (primary) hypertension: Secondary | ICD-10-CM

## 2019-06-21 DIAGNOSIS — IMO0002 Reserved for concepts with insufficient information to code with codable children: Secondary | ICD-10-CM

## 2019-06-21 DIAGNOSIS — E1322 Other specified diabetes mellitus with diabetic chronic kidney disease: Secondary | ICD-10-CM

## 2019-06-21 NOTE — Telephone Encounter (Signed)
CHEM 7 ENTERED FOR OCT DRAW CHEM 7 AND A1C ENTERED FOR DEC DRAW. WILL MAIL

## 2019-07-02 ENCOUNTER — Other Ambulatory Visit: Payer: Self-pay

## 2019-07-02 ENCOUNTER — Ambulatory Visit (INDEPENDENT_AMBULATORY_CARE_PROVIDER_SITE_OTHER): Payer: Medicare HMO

## 2019-07-02 DIAGNOSIS — Z23 Encounter for immunization: Secondary | ICD-10-CM | POA: Diagnosis not present

## 2019-07-23 DIAGNOSIS — I10 Essential (primary) hypertension: Secondary | ICD-10-CM | POA: Diagnosis not present

## 2019-07-24 ENCOUNTER — Encounter: Payer: Self-pay | Admitting: Family Medicine

## 2019-07-24 LAB — BASIC METABOLIC PANEL WITH GFR
BUN/Creatinine Ratio: 17 (calc) (ref 6–22)
BUN: 25 mg/dL (ref 7–25)
CO2: 28 mmol/L (ref 20–32)
Calcium: 10.1 mg/dL (ref 8.6–10.4)
Chloride: 102 mmol/L (ref 98–110)
Creat: 1.49 mg/dL — ABNORMAL HIGH (ref 0.60–0.93)
GFR, Est African American: 40 mL/min/{1.73_m2} — ABNORMAL LOW (ref >=60)
GFR, Est Non African American: 34 mL/min/{1.73_m2} — ABNORMAL LOW (ref >=60)
Glucose, Bld: 139 mg/dL (ref 65–139)
Potassium: 4.5 mmol/L (ref 3.5–5.3)
Sodium: 139 mmol/L (ref 135–146)

## 2019-08-25 ENCOUNTER — Other Ambulatory Visit: Payer: Self-pay | Admitting: Family Medicine

## 2019-08-27 ENCOUNTER — Telehealth: Payer: Self-pay

## 2019-08-27 NOTE — Telephone Encounter (Signed)
Please call [facility] at [phone] to rush a prescription of Tradjenta

## 2019-08-28 ENCOUNTER — Other Ambulatory Visit: Payer: Self-pay

## 2019-08-28 MED ORDER — GLIPIZIDE ER 2.5 MG PO TB24: ORAL_TABLET | ORAL | 1 refills | Status: DC

## 2019-08-29 NOTE — Telephone Encounter (Signed)
Could not get anyone on the number provided. Will try again later

## 2019-09-03 ENCOUNTER — Other Ambulatory Visit: Payer: Self-pay

## 2019-09-03 ENCOUNTER — Telehealth: Payer: Self-pay | Admitting: *Deleted

## 2019-09-03 MED ORDER — LINAGLIPTIN 5 MG PO TABS: 5.0000 mg | ORAL_TABLET | Freq: Every day | ORAL | 1 refills | Status: DC

## 2019-09-03 NOTE — Telephone Encounter (Signed)
Jarrett Soho with [facility] pharmacy called needing a new prescription for tradjenta faxed to [phone]

## 2019-09-03 NOTE — Telephone Encounter (Signed)
Faxed rx again with note to send out urgently because patient is out

## 2019-09-03 NOTE — Telephone Encounter (Signed)
rx faxed with note that patient needs urgently

## 2019-09-24 ENCOUNTER — Ambulatory Visit (HOSPITAL_COMMUNITY)
Admission: RE | Admit: 2019-09-24 | Discharge: 2019-09-24 | Disposition: A | Discharge status: Home / Self Care | Payer: Medicare HMO | Source: Ambulatory Visit | Attending: Family Medicine | Admitting: Family Medicine

## 2019-09-24 ENCOUNTER — Other Ambulatory Visit: Payer: Self-pay

## 2019-09-24 DIAGNOSIS — Z1231 Encounter for screening mammogram for malignant neoplasm of breast: Secondary | ICD-10-CM | POA: Insufficient documentation

## 2019-09-24 DIAGNOSIS — I10 Essential (primary) hypertension: Secondary | ICD-10-CM | POA: Diagnosis not present

## 2019-09-24 DIAGNOSIS — N184 Chronic kidney disease, stage 4 (severe): Secondary | ICD-10-CM | POA: Diagnosis not present

## 2019-09-24 DIAGNOSIS — E1322 Other specified diabetes mellitus with diabetic chronic kidney disease: Secondary | ICD-10-CM | POA: Diagnosis not present

## 2019-09-24 DIAGNOSIS — E1365 Other specified diabetes mellitus with hyperglycemia: Secondary | ICD-10-CM | POA: Diagnosis not present

## 2019-09-25 ENCOUNTER — Encounter: Payer: Self-pay | Admitting: Family Medicine

## 2019-09-25 LAB — HEMOGLOBIN A1C
Hgb A1c MFr Bld: 6.8 % of total Hgb — ABNORMAL HIGH (ref <5.7)
Mean Plasma Glucose: 148 (calc)
eAG (mmol/L): 8.2 (calc)

## 2019-09-25 LAB — BASIC METABOLIC PANEL WITH GFR
BUN/Creatinine Ratio: 18 (calc) (ref 6–22)
BUN: 29 mg/dL — ABNORMAL HIGH (ref 7–25)
CO2: 27 mmol/L (ref 20–32)
Calcium: 10.2 mg/dL (ref 8.6–10.4)
Chloride: 102 mmol/L (ref 98–110)
Creat: 1.64 mg/dL — ABNORMAL HIGH (ref 0.60–0.93)
GFR, Est African American: 35 mL/min/{1.73_m2} — ABNORMAL LOW (ref >=60)
GFR, Est Non African American: 30 mL/min/{1.73_m2} — ABNORMAL LOW (ref >=60)
Glucose, Bld: 190 mg/dL — ABNORMAL HIGH (ref 65–139)
Potassium: 4.1 mmol/L (ref 3.5–5.3)
Sodium: 138 mmol/L (ref 135–146)

## 2019-10-02 ENCOUNTER — Other Ambulatory Visit: Payer: Self-pay

## 2019-10-02 ENCOUNTER — Encounter: Payer: Self-pay | Admitting: Family Medicine

## 2019-10-02 ENCOUNTER — Ambulatory Visit (INDEPENDENT_AMBULATORY_CARE_PROVIDER_SITE_OTHER): Payer: Medicare HMO | Admitting: Family Medicine

## 2019-10-02 VITALS — BP 132/74 | HR 60 | Temp 96.8°F | Resp 15 | Ht 67.0 in | Wt 166.1 lb

## 2019-10-02 DIAGNOSIS — M5441 Lumbago with sciatica, right side: Secondary | ICD-10-CM | POA: Diagnosis not present

## 2019-10-02 DIAGNOSIS — N183 Chronic kidney disease, stage 3 unspecified: Secondary | ICD-10-CM | POA: Diagnosis not present

## 2019-10-02 DIAGNOSIS — E1122 Type 2 diabetes mellitus with diabetic chronic kidney disease: Secondary | ICD-10-CM

## 2019-10-02 DIAGNOSIS — E785 Hyperlipidemia, unspecified: Secondary | ICD-10-CM | POA: Diagnosis not present

## 2019-10-02 DIAGNOSIS — N184 Chronic kidney disease, stage 4 (severe): Secondary | ICD-10-CM

## 2019-10-02 DIAGNOSIS — Z1211 Encounter for screening for malignant neoplasm of colon: Secondary | ICD-10-CM | POA: Diagnosis not present

## 2019-10-02 DIAGNOSIS — G8929 Other chronic pain: Secondary | ICD-10-CM

## 2019-10-02 DIAGNOSIS — I129 Hypertensive chronic kidney disease with stage 1 through stage 4 chronic kidney disease, or unspecified chronic kidney disease: Secondary | ICD-10-CM | POA: Diagnosis not present

## 2019-10-02 DIAGNOSIS — I10 Essential (primary) hypertension: Secondary | ICD-10-CM | POA: Diagnosis not present

## 2019-10-02 MED ORDER — GLIPIZIDE ER 2.5 MG PO TB24: ORAL_TABLET | ORAL | 1 refills | Status: DC

## 2019-10-02 MED ORDER — HYDROCODONE-ACETAMINOPHEN 5-325 MG PO TABS: ORAL_TABLET | ORAL | 0 refills | Status: DC

## 2019-10-02 MED ORDER — BENAZEPRIL HCL 40 MG PO TABS: 40.0000 mg | ORAL_TABLET | Freq: Every day | ORAL | 1 refills | Status: DC

## 2019-10-02 MED ORDER — LOVASTATIN 40 MG PO TABS: 40.0000 mg | ORAL_TABLET | Freq: Every day | ORAL | 1 refills | Status: DC

## 2019-10-02 NOTE — Patient Instructions (Addendum)
Please change annual physical exam in office with MD to week of March 22.Cancewl January appt  Please stop tradgenta  Nurse to arrange cologuard test today  Please get fasting lipid, cmp and eGFr , HBA1C and TSH week of March 15  Careful not to fall, limited amount of hydrocodone presc ribed  You are being referred to a Nephrologist since you have chronic kidney disease, this will be benfivial to your overall health, we will call with appt info and try our best to stay local, [place] first, then [place] if possible  Thanks for choosing [facility], we consider it a privelige to serve you.

## 2019-10-03 ENCOUNTER — Encounter: Payer: Self-pay | Admitting: Family Medicine

## 2019-10-03 DIAGNOSIS — N1832 Chronic kidney disease, stage 3b: Secondary | ICD-10-CM | POA: Insufficient documentation

## 2019-10-03 DIAGNOSIS — N183 Chronic kidney disease, stage 3 unspecified: Secondary | ICD-10-CM | POA: Insufficient documentation

## 2019-10-03 NOTE — Assessment & Plan Note (Signed)
Hyperlipidemia:Low fat diet discussed and encouraged.   Lipid Panel  Lab Results  Component Value Date   CHOL 192 03/16/2019   HDL 97 03/16/2019   LDLCALC 81 03/16/2019   TRIG 63 03/16/2019   CHOLHDL 2.0 03/16/2019   Controlled, no change in medication    

## 2019-10-03 NOTE — Assessment & Plan Note (Signed)
Controlled, no change in medication DASH diet and commitment to daily physical activity for a minimum of 30 minutes discussed and encouraged, as a part of hypertension management. The importance of attaining a healthy weight is also discussed.  BP/Weight 10/02/2019 05/22/2019 05/14/2019 01/25/2019 11/29/2018 5/92/9244 03/19/8637  Systolic BP 177 116 579 [phone]  Diastolic BP 74 64 70 72 65 79 80  Wt. (Lbs) 166.12 159 159 151 - - 150.12  BMI 26.02 24.54 24.54 22.96 - - 22.83

## 2019-10-03 NOTE — Progress Notes (Signed)
Mikayla Jenkins     MRN: [id number]      DOB: Jun 10, 1946   HPI Mikayla Jenkins is here for follow up and re-evaluation of chronic medical conditions, medication management and review of any available recent lab and radiology data.  Preventive health is updated, specifically  Cancer screening and Immunization.    The Mikayla Jenkins denies any adverse reactions to current medications since the last visit.  C/o high cost of tragenta nnow that she is in the donut hole   ROS Denies recent fever or chills. Denies sinus pressure, nasal congestion, ear pain or sore throat. Denies chest congestion, productive cough or wheezing. Denies chest pains, palpitations and leg swelling Denies abdominal pain, nausea, vomiting,diarrhea or constipation.   Denies dysuria, frequency, hesitancy or incontinence. C/o chronic  joint pain, and limitation in mobility.recurren sciatic flares , no aggravating factor, denies on limited hydrocodone Denies headaches, seizures, numbness, or tingling. Denies depression, anxiety or insomnia. Denies skin break down or rash.   PE  BP 132/74   Pulse 60   Temp (!) 96.8 F (36 C) (Temporal)   Resp 15   Ht 5\' 7"  (1.702 m)   Wt 166 lb 1.9 oz (75.4 kg)   SpO2 98%   BMI 26.02 kg/m   Patient alert and oriented and in no cardiopulmonary distress.  HEENT: No facial asymmetry, EOMI,     Neck supple .  Chest: Clear to auscultation bilaterally.  CVS: S1, S2 no murmurs, no S3.Regular rate.  ABD: Soft non tender.   Ext: No edema  MS: decreased  ROM spine, shoulders, hips and knees.  Skin: Intact, no ulcerations or rash noted.  Psych: Good eye contact, normal affect. Memory intact not anxious or depressed appearing.  CNS: CN 2-12 intact, power,  normal throughout.no focal deficits noted.   Assessment & Plan  *Malignant hypertension Controlled, no change in medication DASH diet and commitment to daily physical activity for a minimum of 30 minutes discussed and encouraged, as a  part of hypertension management. The importance of attaining a healthy weight is also discussed.  BP/Weight 10/02/2019 05/22/2019 05/14/2019 01/25/2019 11/29/2018 3/66/4403 01/22/4258  Systolic BP 563 875 643 329 518 841 660  Diastolic BP 74 64 70 72 65 79 80  Wt. (Lbs) 166.12 159 159 151 - - 150.12  BMI 26.02 24.54 24.54 22.96 - - 22.83       Type 2 DM with CKD stage 4 and hypertension ([place]) Mikayla Jenkins is reminded of the importance of commitment to daily physical activity for 30 minutes or more, as able and the need to limit carbohydrate intake to 30 to 60 grams per meal to help with blood sugar control.   The need to take medication as prescribed, test blood sugar as directed, and to call between visits if there is a concern that blood sugar is uncontrolled is also discussed.   Mikayla Jenkins is reminded of the importance of daily foot exam, annual eye examination, and good blood sugar, blood pressure and cholesterol control.  Diabetic Labs Latest Ref Rng & Units 09/24/2019 07/23/2019 06/14/2019 03/16/2019 01/17/2019  HbA1c <5.7 % of total Hgb 6.8(H) - 6.2(H) - 8.4(H)  Microalbumin mg/dL - - <0.2 - -  Micro/Creat Ratio <30 mcg/mg creat - - - - -  Chol <200 mg/dL - - - 192 -  HDL > OR = 50 mg/dL - - - 97 -  Calc LDL mg/dL (calc) - - - 81 -  Triglycerides <150 mg/dL - - -  63 -  Creatinine 0.60 - 0.93 mg/dL 1.64(H) 1.49(H) 1.73(H) 1.33(H) 1.58(H)   BP/Weight 10/02/2019 05/22/2019 05/14/2019 01/25/2019 11/29/2018 1/57/2620 12/20/5972  Systolic BP 163 845 364 680 321 224 825  Diastolic BP 74 64 70 72 65 79 80  Wt. (Lbs) 166.12 159 159 151 - - 150.12  BMI 26.02 24.54 24.54 22.96 - - 22.83   Foot/eye exam completion dates Latest Ref Rng & Units 10/19/2018 08/01/2018  Eye Exam No Retinopathy - No Retinopathy  Foot Form Completion - Done -        Pain in back Intermittent flares, will prescribe limited hydrocodone for uncontrolled flares  Hyperlipidemia LDL goal <100 Hyperlipidemia:Low fat diet discussed  and encouraged.   Lipid Panel  Lab Results  Component Value Date   CHOL 192 03/16/2019   HDL 97 03/16/2019   LDLCALC 81 03/16/2019   TRIG 63 03/16/2019   CHOLHDL 2.0 03/16/2019   Controlled, no change in medication     CKD (chronic kidney disease) stage 3, GFR 30-59 ml/min Refer to nephrology for management, underlying pathology id malignant HTN and type  2 diabetes

## 2019-10-03 NOTE — Assessment & Plan Note (Signed)
Refer to nephrology for management, underlying pathology id malignant HTN and type  2 diabetes

## 2019-10-03 NOTE — Assessment & Plan Note (Signed)
Intermittent flares, will prescribe limited hydrocodone for uncontrolled flares

## 2019-10-03 NOTE — Assessment & Plan Note (Signed)
Mikayla Jenkins is reminded of the importance of commitment to daily physical activity for 30 minutes or more, as able and the need to limit carbohydrate intake to 30 to 60 grams per meal to help with blood sugar control.   The need to take medication as prescribed, test blood sugar as directed, and to call between visits if there is a concern that blood sugar is uncontrolled is also discussed.   Mikayla Jenkins is reminded of the importance of daily foot exam, annual eye examination, and good blood sugar, blood pressure and cholesterol control.  Diabetic Labs Latest Ref Rng & Units 09/24/2019 07/23/2019 06/14/2019 03/16/2019 01/17/2019  HbA1c <5.7 % of total Hgb 6.8(H) - 6.2(H) - 8.4(H)  Microalbumin mg/dL - - <0.2 - -  Micro/Creat Ratio <30 mcg/mg creat - - - - -  Chol <200 mg/dL - - - 192 -  HDL > OR = 50 mg/dL - - - 97 -  Calc LDL mg/dL (calc) - - - 81 -  Triglycerides <150 mg/dL - - - 63 -  Creatinine 0.60 - 0.93 mg/dL 1.64(H) 1.49(H) 1.73(H) 1.33(H) 1.58(H)   BP/Weight 10/02/2019 05/22/2019 05/14/2019 01/25/2019 11/29/2018 1/44/3154 0/0/8676  Systolic BP 195 093 267 124 580 998 338  Diastolic BP 74 64 70 72 65 79 80  Wt. (Lbs) 166.12 159 159 151 - - 150.12  BMI 26.02 24.54 24.54 22.96 - - 22.83   Foot/eye exam completion dates Latest Ref Rng & Units 10/19/2018 08/01/2018  Eye Exam No Retinopathy - No Retinopathy  Foot Form Completion - Done -

## 2019-10-09 DIAGNOSIS — N189 Chronic kidney disease, unspecified: Secondary | ICD-10-CM | POA: Insufficient documentation

## 2019-10-10 DIAGNOSIS — N189 Chronic kidney disease, unspecified: Secondary | ICD-10-CM | POA: Diagnosis not present

## 2019-10-10 DIAGNOSIS — I129 Hypertensive chronic kidney disease with stage 1 through stage 4 chronic kidney disease, or unspecified chronic kidney disease: Secondary | ICD-10-CM | POA: Diagnosis not present

## 2019-10-10 DIAGNOSIS — D638 Anemia in other chronic diseases classified elsewhere: Secondary | ICD-10-CM | POA: Diagnosis not present

## 2019-10-10 DIAGNOSIS — E1122 Type 2 diabetes mellitus with diabetic chronic kidney disease: Secondary | ICD-10-CM | POA: Diagnosis not present

## 2019-10-10 DIAGNOSIS — I5032 Chronic diastolic (congestive) heart failure: Secondary | ICD-10-CM | POA: Diagnosis not present

## 2019-10-14 LAB — COLOGUARD: Cologuard: NEGATIVE

## 2019-10-17 ENCOUNTER — Other Ambulatory Visit (HOSPITAL_COMMUNITY): Payer: Self-pay | Admitting: Nephrology

## 2019-10-17 DIAGNOSIS — E1122 Type 2 diabetes mellitus with diabetic chronic kidney disease: Secondary | ICD-10-CM

## 2019-10-23 ENCOUNTER — Encounter: Payer: Medicare HMO | Admitting: Family Medicine

## 2019-10-24 ENCOUNTER — Other Ambulatory Visit: Payer: Self-pay

## 2019-10-24 ENCOUNTER — Ambulatory Visit (HOSPITAL_COMMUNITY)
Admission: RE | Admit: 2019-10-24 | Discharge: 2019-10-24 | Disposition: A | Discharge status: Home / Self Care | Payer: Medicare HMO | Source: Ambulatory Visit | Attending: Nephrology | Admitting: Nephrology

## 2019-10-24 DIAGNOSIS — N186 End stage renal disease: Secondary | ICD-10-CM | POA: Diagnosis not present

## 2019-10-24 DIAGNOSIS — I120 Hypertensive chronic kidney disease with stage 5 chronic kidney disease or end stage renal disease: Secondary | ICD-10-CM | POA: Diagnosis not present

## 2019-10-24 DIAGNOSIS — E1122 Type 2 diabetes mellitus with diabetic chronic kidney disease: Secondary | ICD-10-CM | POA: Diagnosis not present

## 2019-10-24 DIAGNOSIS — Z1211 Encounter for screening for malignant neoplasm of colon: Secondary | ICD-10-CM | POA: Diagnosis not present

## 2019-10-29 ENCOUNTER — Telehealth: Payer: Self-pay

## 2019-10-29 DIAGNOSIS — Z1211 Encounter for screening for malignant neoplasm of colon: Secondary | ICD-10-CM

## 2019-10-29 NOTE — Telephone Encounter (Signed)
-----   Message from Fayrene Helper, MD sent at 10/25/2019  4:18 PM EST ----- Regarding: will need to re submit cologuard, message states could not be processed, thanks

## 2019-11-16 ENCOUNTER — Emergency Department (HOSPITAL_COMMUNITY)
Admission: EM | Admit: 2019-11-16 | Discharge: 2019-11-17 | Disposition: A | Discharge status: Home / Self Care | Payer: Medicare HMO | Attending: Emergency Medicine | Admitting: Emergency Medicine

## 2019-11-16 ENCOUNTER — Emergency Department (HOSPITAL_COMMUNITY): Payer: Medicare HMO

## 2019-11-16 ENCOUNTER — Other Ambulatory Visit: Payer: Self-pay

## 2019-11-16 ENCOUNTER — Encounter (HOSPITAL_COMMUNITY): Payer: Self-pay | Admitting: *Deleted

## 2019-11-16 DIAGNOSIS — Z7984 Long term (current) use of oral hypoglycemic drugs: Secondary | ICD-10-CM | POA: Diagnosis not present

## 2019-11-16 DIAGNOSIS — I129 Hypertensive chronic kidney disease with stage 1 through stage 4 chronic kidney disease, or unspecified chronic kidney disease: Secondary | ICD-10-CM | POA: Insufficient documentation

## 2019-11-16 DIAGNOSIS — R0789 Other chest pain: Secondary | ICD-10-CM

## 2019-11-16 DIAGNOSIS — R079 Chest pain, unspecified: Secondary | ICD-10-CM | POA: Diagnosis not present

## 2019-11-16 DIAGNOSIS — N183 Chronic kidney disease, stage 3 unspecified: Secondary | ICD-10-CM | POA: Insufficient documentation

## 2019-11-16 DIAGNOSIS — E1122 Type 2 diabetes mellitus with diabetic chronic kidney disease: Secondary | ICD-10-CM | POA: Diagnosis not present

## 2019-11-16 DIAGNOSIS — Z79899 Other long term (current) drug therapy: Secondary | ICD-10-CM | POA: Insufficient documentation

## 2019-11-16 DIAGNOSIS — Z7982 Long term (current) use of aspirin: Secondary | ICD-10-CM | POA: Diagnosis not present

## 2019-11-16 HISTORY — DX: Disorder of kidney and ureter, unspecified: N28.9

## 2019-11-16 LAB — BASIC METABOLIC PANEL
Anion gap: 13 (ref 5–15)
BUN: 28 mg/dL — ABNORMAL HIGH (ref 8–23)
CO2: 25 mmol/L (ref 22–32)
Calcium: 10.7 mg/dL — ABNORMAL HIGH (ref 8.9–10.3)
Chloride: 100 mmol/L (ref 98–111)
Creatinine, Ser: 1.49 mg/dL — ABNORMAL HIGH (ref 0.44–1.00)
GFR calc Af Amer: 40 mL/min — ABNORMAL LOW (ref >60)
GFR calc non Af Amer: 34 mL/min — ABNORMAL LOW (ref >60)
Glucose, Bld: 147 mg/dL — ABNORMAL HIGH (ref 70–99)
Potassium: 4 mmol/L (ref 3.5–5.1)
Sodium: 138 mmol/L (ref 135–145)

## 2019-11-16 LAB — CBG MONITORING, ED: Glucose-Capillary: 135 mg/dL — ABNORMAL HIGH (ref 70–99)

## 2019-11-16 LAB — CBC
HCT: 41.6 % (ref 36.0–46.0)
Hemoglobin: 13.5 g/dL (ref 12.0–15.0)
MCH: 31.6 pg (ref 26.0–34.0)
MCHC: 32.5 g/dL (ref 30.0–36.0)
MCV: 97.4 fL (ref 80.0–100.0)
Platelets: 226 10*3/uL (ref 150–400)
RBC: 4.27 MIL/uL (ref 3.87–5.11)
RDW: 12.2 % (ref 11.5–15.5)
WBC: 6.2 10*3/uL (ref 4.0–10.5)
nRBC: 0 % (ref 0.0–0.2)

## 2019-11-16 LAB — TROPONIN I (HIGH SENSITIVITY)
Troponin I (High Sensitivity): 6 ng/L (ref <18)
Troponin I (High Sensitivity): 7 ng/L (ref <18)

## 2019-11-16 MED ORDER — ASPIRIN 81 MG PO CHEW
324.0000 mg | CHEWABLE_TABLET | Freq: Once | ORAL | Status: AC
  Administered 2019-11-16: 324 mg via ORAL
  Filled 2019-11-16: qty 4

## 2019-11-16 NOTE — ED Notes (Signed)
Spoke with pt daughter, [name]- informed of lab results and a/w 2nd troponin result, unable to say whether pt will be admitted or not at this time, informed pt has phone beside bed if she would like to call and speak with pt

## 2019-11-16 NOTE — ED Triage Notes (Signed)
Pt c/o mid center chest pain that started last night,

## 2019-11-16 NOTE — ED Notes (Signed)
Pts Daughter [name] called to check on Pts status and get updates. Informed this RN that Pts spouse is still waiting in the ED parking lot. RN informed family lab work is still in process and no further updates are available at this time. Pt resting comfortably in room and per family request was asked if she could call them from her cellphone to update them on how she is feeling.

## 2019-11-16 NOTE — ED Provider Notes (Addendum)
[facility] EMERGENCY DEPARTMENT Provider Note   CSN: [id number] Arrival date & time: 11/16/19  1915     History Chief Complaint  Patient presents with  . Chest Pain    Mikayla Jenkins is a 74 y.o. female with a history of diabetes, hyperlipidemia, hypertension and chronic renal insufficiency presenting with a 24-hour history of left-sided chest discomfort.  She describes an aching sensation across to her left chest and breast region which started last night while she was lying down to sleep.  She described having intermittent episodes of pain throughout the night and today it has been more constant throughout the day.  This evening before arriving here she felt weak and shaky and had to sit down in her kitchen while she was trying to prepare a meal.  She has had no shortness of breath, palpitations, diaphoresis, nausea or vomiting with these episodes.  There is no radiation of pain.  She took a baby aspirin this morning when she woke.  She has found no alleviators for symptoms.  HPI  HPI: A 74 year old patient with a history of treated diabetes, hypertension and hypercholesterolemia presents for evaluation of chest pain. Initial onset of pain was more than 6 hours ago. The patient's chest pain is described as heaviness/pressure/tightness and is not worse with exertion. The patient's chest pain is middle- or left-sided, is not well-localized, is not sharp and does not radiate to the arms/jaw/neck. The patient does not complain of nausea and denies diaphoresis. The patient has no history of stroke, has no history of peripheral artery disease, has not smoked in the past 90 days, has no relevant family history of coronary artery disease (first degree relative at less than age 70) and does not have an elevated BMI (>=30).   Past Medical History:  Diagnosis Date  . Diabetes mellitus type II   . Hyperlipidemia   . Hypertension   . Renal disorder     Patient Active Problem List   Diagnosis Date  Noted  . CKD (chronic kidney disease) stage 3, GFR 30-59 ml/min 10/03/2019  . Type 2 DM with CKD stage 4 and hypertension ([place]) 01/25/2019  . Anemia 07/01/2017  . Female bladder prolapse 06/30/2015  . Lipoma of abdominal wall 01/24/2013  . Pain in back 05/15/2012  . Low back pain with left-sided sciatica 04/26/2012  . ALLERGIC RHINITIS, SEASONAL 04/13/2009  . Hyperlipidemia LDL goal <100 01/10/2008  . Malignant hypertension 01/10/2008    Past Surgical History:  Procedure Laterality Date  . ABDOMINAL HYSTERECTOMY    . COLONOSCOPY N/A 06/13/2015   Procedure: COLONOSCOPY;  Surgeon: Rogene Houston, MD;  Location[facility];  Service: Endoscopy;  Laterality: N/A;  930-rescheduled 8/26 @ 8:30am Ann to notify pt  . HERNIA REPAIR    . inguinal herniorrhapy right    . right eye surgery secondary to right eye weakness    . SALPINGOOPHORECTOMY    . TUBAL LIGATION       OB History   No obstetric history on file.     Family History  Problem Relation Age of Onset  . Pneumonia Brother        on continuous O2  . Stroke Brother   . Stroke Sister   . COPD Sister   . Diabetes Sister   . Stroke Sister   . Hypertension Mother   . Diabetes Mother   . Fibroids Daughter        uterine    Social History   Tobacco Use  .  Smoking status: Never Smoker  . Smokeless tobacco: Never Used  Substance Use Topics  . Alcohol use: No  . Drug use: No    Home Medications Prior to Admission medications   Medication Sig Start Date End Date Taking? Authorizing Provider  amLODipine (NORVASC) 10 MG tablet TAKE 1 TABLET EVERY DAY 08/26/19  Yes Fayrene Helper, MD  aspirin (ASPIRIN LOW DOSE) 81 MG EC tablet Take 81 mg by mouth daily. Take 1 tablet by mouth once a day    Yes [provider]  atenolol-chlorthalidone (TENORETIC) 100-25 MG tablet TAKE 1 TABLET EVERY DAY 06/07/19  Yes Fayrene Helper, MD  benazepril (LOTENSIN) 40 MG tablet Take 1 tablet (40 mg total) by mouth daily.  10/02/19  Yes Fayrene Helper, MD  blood glucose meter kit and supplies Dispense based on patient and insurance preference. Use up to four times daily as directed. (FOR ICD-10 E10.9, E11.9). 05/03/18  Yes Fayrene Helper, MD  Calcium Carbonate-Vitamin D (CALCIUM 600/VITAMIN D) 600-400 MG-UNIT per tablet Take 1 tablet by mouth daily.   Yes [provider]  cloNIDine (CATAPRES) 0.3 MG tablet TAKE 1 AND 1/2 TABLETS AT BEDTIME Patient taking differently: See admin instructions. TAKE 1 AND 1/2 TABLETS AT BEDTIME 06/07/19  Yes Fayrene Helper, MD  glipiZIDE (GLUCOTROL XL) 2.5 MG 24 hr tablet Take one tablet with breakfast every Tuesday, Thursday and Saturday , by mouth 10/02/19  Yes Fayrene Helper, MD  glucose blood (ACCU-CHEK AVIVA PLUS) test strip TEST BLOOD SUGAR UP TO FOUR TIMES DAILY AS DIRECTED 05/22/19  Yes Perlie Mayo, NP  lovastatin (MEVACOR) 40 MG tablet Take 1 tablet (40 mg total) by mouth daily. 10/02/19  Yes Fayrene Helper, MD  Multiple Vitamin (MULTIVITAMIN) tablet Take 1 tablet by mouth daily.   Yes [provider]  spironolactone (ALDACTONE) 25 MG tablet Take 1 tablet (25 mg total) by mouth daily. 06/14/19  Yes Fayrene Helper, MD    Allergies    Metformin and related  Review of Systems   Review of Systems  Constitutional: Negative for diaphoresis and fever.  HENT: Negative for congestion and sore throat.   Eyes: Negative.   Respiratory: Negative for chest tightness and shortness of breath.   Cardiovascular: Positive for chest pain. Negative for palpitations and leg swelling.  Gastrointestinal: Negative for abdominal pain, nausea and vomiting.  Genitourinary: Negative.   Musculoskeletal: Negative for arthralgias, joint swelling and neck pain.  Skin: Negative.  Negative for rash and wound.  Neurological: Negative for dizziness, weakness, light-headedness, numbness and headaches.  Psychiatric/Behavioral: Negative.     Physical Exam  Updated Vital Signs BP (!) 143/64   Pulse (!) 54   Temp 98.2 F (36.8 C)   Resp 17   Ht 5' 8"  (1.727 m)   Wt 72.6 kg   SpO2 100%   BMI 24.33 kg/m   Physical Exam Vitals and nursing note reviewed.  Constitutional:      Appearance: She is well-developed.  HENT:     Head: Normocephalic and atraumatic.  Eyes:     Conjunctiva/sclera: Conjunctivae normal.  Cardiovascular:     Rate and Rhythm: Normal rate and regular rhythm.     Heart sounds: Normal heart sounds.  Pulmonary:     Effort: Pulmonary effort is normal.     Breath sounds: Normal breath sounds. No wheezing.  Chest:     Chest wall: No tenderness.  Abdominal:     General: Bowel sounds are normal.  Palpations: Abdomen is soft.     Tenderness: There is no abdominal tenderness.  Musculoskeletal:        General: Normal range of motion.     Cervical back: Normal range of motion.     Right lower leg: No tenderness. No edema.     Left lower leg: No tenderness. No edema.  Skin:    General: Skin is warm and dry.  Neurological:     Mental Status: She is alert.     ED Results / Procedures / Treatments   Labs (all labs ordered are listed, but only abnormal results are displayed) Labs Reviewed  BASIC METABOLIC PANEL - Abnormal; Notable for the following components:      Result Value   Glucose, Bld 147 (*)    BUN 28 (*)    Creatinine, Ser 1.49 (*)    Calcium 10.7 (*)    GFR calc non Af Amer 34 (*)    GFR calc Af Amer 40 (*)    All other components within normal limits  CBG MONITORING, ED - Abnormal; Notable for the following components:   Glucose-Capillary 135 (*)    All other components within normal limits  CBC  TROPONIN I (HIGH SENSITIVITY)  TROPONIN I (HIGH SENSITIVITY)    EKG EKG Interpretation  Date/Time:  Friday November 16 2019 19:33:58 EST Ventricular Rate:  59 PR Interval:    QRS Duration: 114 QT Interval:  430 QTC Calculation: 426 R Axis:   -7 Text Interpretation: Sinus rhythm Incomplete  right bundle branch block Probable left ventricular hypertrophy Confirmed by Veryl Speak [phone]) on 11/16/2019 7:39:01 PM   Radiology DG Chest Portable 1 View  Result Date: 11/16/2019 CLINICAL DATA:  Chest pain. EXAM: PORTABLE CHEST 1 VIEW COMPARISON:  November 20, 2009 FINDINGS: Stable cardiomegaly. The hila and mediastinum are normal. No pneumothorax. No nodule or mass. No focal infiltrate. IMPRESSION: No active disease. Electronically Signed   By: Dorise Bullion III M.D   On: 11/16/2019 21:48    Procedures Procedures (including critical care time)  Medications Ordered in ED Medications  aspirin chewable tablet 324 mg (324 mg Oral Given 11/16/19 2151)    ED Course  I have reviewed the triage vital signs and the nursing notes.  Pertinent labs & imaging results that were available during my care of the patient were reviewed by me and considered in my medical decision making (see chart for details).    MDM Rules/Calculators/A&P                    Heart Score 6, initial troponin 6, second 7, so stable delta troponins.      Pt with atypical chest pain, asymptomatic here.  Labs, ekg and cxr reviewed and discussed with pt.   Also discussed with Dr. Roxanne Mins prior to dc home. Pt with no signs of cardiac stress, doubt unstable angina, VSS, no hypoxia or sob, doubt PE.  Most likely MSK source.  No burning quality to pain, denies GERD sx.  Plan close f/u with pcp this week.   Final Clinical Impression(s) / ED Diagnoses Final diagnoses:  Atypical chest pain    Rx / DC Orders ED Discharge Orders    None       Landis Martins 11/17/19 0115    Evalee Jefferson, PA-C 49/75/30 0511    Delora Fuel, MD 11/29/15 [phone]

## 2019-11-17 NOTE — Discharge Instructions (Addendum)
Your lab tests, chest xray and ekg tracing tonight is reassuring regarding your symptoms.  This does not appear to be from a heart attack or any lung problems as the source of your pain.  However, it would be wise to have a close followup with your primary MD this week.  She may recommend other tests if your symptoms return or become more persistent.

## 2019-11-19 DIAGNOSIS — D638 Anemia in other chronic diseases classified elsewhere: Secondary | ICD-10-CM | POA: Diagnosis not present

## 2019-11-19 DIAGNOSIS — E559 Vitamin D deficiency, unspecified: Secondary | ICD-10-CM | POA: Diagnosis not present

## 2019-11-19 DIAGNOSIS — Z79899 Other long term (current) drug therapy: Secondary | ICD-10-CM | POA: Diagnosis not present

## 2019-11-19 DIAGNOSIS — E1122 Type 2 diabetes mellitus with diabetic chronic kidney disease: Secondary | ICD-10-CM | POA: Diagnosis not present

## 2019-11-19 DIAGNOSIS — I129 Hypertensive chronic kidney disease with stage 1 through stage 4 chronic kidney disease, or unspecified chronic kidney disease: Secondary | ICD-10-CM | POA: Diagnosis not present

## 2019-11-19 DIAGNOSIS — I5032 Chronic diastolic (congestive) heart failure: Secondary | ICD-10-CM | POA: Diagnosis not present

## 2019-11-19 DIAGNOSIS — N189 Chronic kidney disease, unspecified: Secondary | ICD-10-CM | POA: Diagnosis not present

## 2019-11-28 DIAGNOSIS — N189 Chronic kidney disease, unspecified: Secondary | ICD-10-CM | POA: Diagnosis not present

## 2019-11-28 DIAGNOSIS — I5032 Chronic diastolic (congestive) heart failure: Secondary | ICD-10-CM | POA: Diagnosis not present

## 2019-11-28 DIAGNOSIS — E1122 Type 2 diabetes mellitus with diabetic chronic kidney disease: Secondary | ICD-10-CM | POA: Diagnosis not present

## 2019-11-28 DIAGNOSIS — I129 Hypertensive chronic kidney disease with stage 1 through stage 4 chronic kidney disease, or unspecified chronic kidney disease: Secondary | ICD-10-CM | POA: Diagnosis not present

## 2019-11-28 DIAGNOSIS — Z79899 Other long term (current) drug therapy: Secondary | ICD-10-CM | POA: Diagnosis not present

## 2019-12-05 ENCOUNTER — Other Ambulatory Visit: Payer: Self-pay

## 2019-12-05 ENCOUNTER — Other Ambulatory Visit: Payer: Self-pay | Admitting: Family Medicine

## 2019-12-05 ENCOUNTER — Telehealth: Payer: Self-pay | Admitting: *Deleted

## 2019-12-05 DIAGNOSIS — E1121 Type 2 diabetes mellitus with diabetic nephropathy: Secondary | ICD-10-CM

## 2019-12-05 MED ORDER — CLONIDINE HCL 0.3 MG PO TABS: ORAL_TABLET | ORAL | 1 refills | Status: DC

## 2019-12-05 MED ORDER — ACCU-CHEK AVIVA PLUS VI STRP: ORAL_STRIP | 1 refills | Status: DC

## 2019-12-05 NOTE — Telephone Encounter (Signed)
Prescription send to [facility]

## 2019-12-05 NOTE — Telephone Encounter (Signed)
Pt needs a temp script of clonidine called in to [facility] in [place] as the pharmacy she uses is on back order and she is out of the medication

## 2019-12-18 ENCOUNTER — Telehealth: Payer: Self-pay

## 2019-12-18 NOTE — Telephone Encounter (Signed)

## 2019-12-22 ENCOUNTER — Other Ambulatory Visit: Payer: Self-pay | Admitting: Family Medicine

## 2019-12-22 DIAGNOSIS — E785 Hyperlipidemia, unspecified: Secondary | ICD-10-CM

## 2019-12-22 DIAGNOSIS — I10 Essential (primary) hypertension: Secondary | ICD-10-CM

## 2019-12-24 ENCOUNTER — Encounter: Payer: Self-pay | Admitting: Cardiovascular Disease

## 2019-12-24 ENCOUNTER — Telehealth (INDEPENDENT_AMBULATORY_CARE_PROVIDER_SITE_OTHER): Payer: Medicare HMO | Admitting: Cardiovascular Disease

## 2019-12-24 VITALS — BP 136/64 | HR 52 | Ht 67.0 in | Wt 167.0 lb

## 2019-12-24 DIAGNOSIS — N183 Chronic kidney disease, stage 3 unspecified: Secondary | ICD-10-CM | POA: Diagnosis not present

## 2019-12-24 DIAGNOSIS — I10 Essential (primary) hypertension: Secondary | ICD-10-CM

## 2019-12-24 DIAGNOSIS — I129 Hypertensive chronic kidney disease with stage 1 through stage 4 chronic kidney disease, or unspecified chronic kidney disease: Secondary | ICD-10-CM | POA: Diagnosis not present

## 2019-12-24 DIAGNOSIS — E785 Hyperlipidemia, unspecified: Secondary | ICD-10-CM

## 2019-12-24 DIAGNOSIS — E119 Type 2 diabetes mellitus without complications: Secondary | ICD-10-CM

## 2019-12-24 DIAGNOSIS — E1122 Type 2 diabetes mellitus with diabetic chronic kidney disease: Secondary | ICD-10-CM | POA: Diagnosis not present

## 2019-12-24 NOTE — Patient Instructions (Signed)

## 2019-12-24 NOTE — Progress Notes (Signed)
Virtual Visit via Telephone Note   This visit type was conducted due to national recommendations for restrictions regarding the COVID-19 Pandemic (e.g. social distancing) in an effort to limit this patient's exposure and mitigate transmission in our community.  Due to her co-morbid illnesses, this patient is at least at moderate risk for complications without adequate follow up.  This format is felt to be most appropriate for this patient at this time.  The patient did not have access to video technology/had technical difficulties with video requiring transitioning to audio format only (telephone).  All issues noted in this document were discussed and addressed.  No physical exam could be performed with this format.  Please refer to the patient's chart for her  consent to telehealth for [facility].   The patient was identified using 2 identifiers.  Date:  12/24/2019   ID:  Mikayla Jenkins, DOB October 21, 1945, MRN [id number]  Patient Location: Home Provider Location: Home  PCP:  Fayrene Helper, MD  Cardiologist:  Kate Sable, MD  Electrophysiologist:  None   Evaluation Performed:  Follow-Up Visit  Chief Complaint:  HTN  History of Present Illness:    Mikayla Jenkins is a 74 y.o. female with resistant hypertension.  She was evaluated for atypical chest pain in the ER on 11/16/19.  HS troponins were normal.  Creatinine 1.49 on 11/16/19.  CXR showed no active disease.  The patient denies any symptoms of chest pain, palpitations, shortness of breath, lightheadedness, dizziness, leg swelling, orthopnea, PND, and syncope.  She drinks a lot of water.  Social History: She is married.She has a son and2daughters. Heryoungerdaughter lives in [place] and is a doctor of pharmacy and works for [employer]. The patient has 7 grandchildren altogether.    Past Medical History:  Diagnosis Date  . Diabetes mellitus type II   .  Hyperlipidemia   . Hypertension   . Renal disorder    Past Surgical History:  Procedure Laterality Date  . ABDOMINAL HYSTERECTOMY    . COLONOSCOPY N/A 06/13/2015   Procedure: COLONOSCOPY;  Surgeon: Rogene Houston, MD;  Location: [facility];  Service: Endoscopy;  Laterality: N/A;  930-rescheduled 8/26 @ 8:30am Ann to notify pt  . HERNIA REPAIR    . inguinal herniorrhapy right    . right eye surgery secondary to right eye weakness    . SALPINGOOPHORECTOMY    . TUBAL LIGATION       Current Meds  Medication Sig  . amLODipine (NORVASC) 10 MG tablet TAKE 1 TABLET EVERY DAY  . aspirin (ASPIRIN LOW DOSE) 81 MG EC tablet Take 81 mg by mouth daily. Take 1 tablet by mouth once a day   . atenolol-chlorthalidone (TENORETIC) 100-25 MG tablet TAKE 1 TABLET EVERY DAY  . benazepril (LOTENSIN) 40 MG tablet Take 1 tablet (40 mg total) by mouth daily.  . blood glucose meter kit and supplies Dispense based on patient and insurance preference. Use up to four times daily as directed. (FOR ICD-10 E10.9, E11.9).  . Calcium Carbonate-Vitamin D (CALCIUM 600/VITAMIN D) 600-400 MG-UNIT per tablet Take 1 tablet by mouth daily.  . cloNIDine (CATAPRES) 0.3 MG tablet Take 1 and 1/2 tablets by mouth at bedtime  . glipiZIDE (GLUCOTROL XL) 2.5 MG 24 hr tablet Take one tablet with breakfast every Tuesday, Thursday and Saturday , by mouth  . glucose blood (ACCU-CHEK AVIVA PLUS) test strip TEST BLOOD SUGAR UP TO FOUR TIMES DAILY AS DIRECTED  . HYDROcodone-acetaminophen (  NORCO/VICODIN) 5-325 MG tablet Take 1 tablet by mouth as needed for moderate pain.  Marland Kitchen lovastatin (MEVACOR) 40 MG tablet Take 1 tablet (40 mg total) by mouth daily.  . Multiple Vitamin (MULTIVITAMIN) tablet Take 1 tablet by mouth daily.  Marland Kitchen spironolactone (ALDACTONE) 25 MG tablet Take 1 tablet (25 mg total) by mouth daily. (Patient taking differently: Take 12.5 mg by mouth every other day. )     Allergies:   Metformin and related   Social History    Tobacco Use  . Smoking status: Never Smoker  . Smokeless tobacco: Never Used  Substance Use Topics  . Alcohol use: No  . Drug use: No     Family Hx: The patient's family history includes COPD in her sister; Diabetes in her mother and sister; Fibroids in her daughter; Hypertension in her mother; Pneumonia in her brother; Stroke in her brother, sister, and sister.  ROS:   Please see the history of present illness.     All other systems reviewed and are negative.   Prior CV studies:   The following studies were reviewed today:  Echocardiogram on 09/06/17 demonstrated normal left ventricular systolic function, LVEF 40-97%, mild LVH, and grade 1 diastolic dysfunction.  There was no evidence of renal artery stenosis by renal arterial Dopplers on 09/14/17.  Labs/Other Tests and Data Reviewed:    EKG:  An ECG dated 11/16/19 was personally reviewed today and demonstrated:  Sinus bradycardia, 59 bpm, incomplete RBBB, nonspecific T wave abnormalities  Recent Labs: 03/16/2019: ALT 11 11/16/2019: BUN 28; Creatinine, Ser 1.49; Hemoglobin 13.5; Platelets 226; Potassium 4.0; Sodium 138   Recent Lipid Panel Lab Results  Component Value Date/Time   CHOL 192 03/16/2019 11:13 AM   TRIG 63 03/16/2019 11:13 AM   HDL 97 03/16/2019 11:13 AM   CHOLHDL 2.0 03/16/2019 11:13 AM   LDLCALC 81 03/16/2019 11:13 AM    Wt Readings from Last 3 Encounters:  12/24/19 167 lb (75.8 kg)  11/16/19 160 lb (72.6 kg)  10/02/19 166 lb 1.9 oz (75.4 kg)     Objective:    Vital Signs:  BP 136/64   Pulse (!) 52   Ht 5' 7"  (1.702 m)   Wt 167 lb (75.8 kg)   BMI 26.16 kg/m    VITAL SIGNS:  reviewed  ASSESSMENT & PLAN:    1. Resistant hypertension:Blood pressure is normal.  Left ventricular systolic function is normal with grade 1 diastolic dysfunction as detailed above.  2. Dyslipidemia: Continue statin therapy.  3. Type 2 diabetes mellitus: Currently on metformin.  4. CKD stage III:  Creatinine 1.49 on 11/16/19. Follows with nephrology.    COVID-19 Education: The signs and symptoms of COVID-19 were discussed with the patient and how to seek care for testing (follow up with PCP or arrange E-visit).  The importance of social distancing was discussed today.  Time:   Today, I have spent 5 minutes with the patient with telehealth technology discussing the above problems.     Medication Adjustments/Labs and Tests Ordered: Current medicines are reviewed at length with the patient today.  Concerns regarding medicines are outlined above.   Tests Ordered: No orders of the defined types were placed in this encounter.   Medication Changes: No orders of the defined types were placed in this encounter.   Follow Up:  Virtual Visit  in 1 year(s)  Signed, Kate Sable, MD  12/24/2019 10:32 AM    [place]

## 2019-12-26 DIAGNOSIS — E119 Type 2 diabetes mellitus without complications: Secondary | ICD-10-CM | POA: Diagnosis not present

## 2019-12-26 LAB — HM DIABETES EYE EXAM

## 2020-01-04 DIAGNOSIS — I129 Hypertensive chronic kidney disease with stage 1 through stage 4 chronic kidney disease, or unspecified chronic kidney disease: Secondary | ICD-10-CM | POA: Diagnosis not present

## 2020-01-04 DIAGNOSIS — E785 Hyperlipidemia, unspecified: Secondary | ICD-10-CM | POA: Diagnosis not present

## 2020-01-04 DIAGNOSIS — I10 Essential (primary) hypertension: Secondary | ICD-10-CM | POA: Diagnosis not present

## 2020-01-04 DIAGNOSIS — N184 Chronic kidney disease, stage 4 (severe): Secondary | ICD-10-CM | POA: Diagnosis not present

## 2020-01-04 DIAGNOSIS — E1122 Type 2 diabetes mellitus with diabetic chronic kidney disease: Secondary | ICD-10-CM | POA: Diagnosis not present

## 2020-01-05 LAB — COMPLETE METABOLIC PANEL WITH GFR
AG Ratio: 1.9 (calc) (ref 1.0–2.5)
ALT: 12 U/L (ref 6–29)
AST: 19 U/L (ref 10–35)
Albumin: 4.5 g/dL (ref 3.6–5.1)
Alkaline phosphatase (APISO): 84 U/L (ref 37–153)
BUN/Creatinine Ratio: 19 (calc) (ref 6–22)
BUN: 28 mg/dL — ABNORMAL HIGH (ref 7–25)
CO2: 29 mmol/L (ref 20–32)
Calcium: 10.2 mg/dL (ref 8.6–10.4)
Chloride: 100 mmol/L (ref 98–110)
Creat: 1.51 mg/dL — ABNORMAL HIGH (ref 0.60–0.93)
GFR, Est African American: 39 mL/min/{1.73_m2} — ABNORMAL LOW (ref >=60)
GFR, Est Non African American: 34 mL/min/{1.73_m2} — ABNORMAL LOW (ref >=60)
Globulin: 2.4 g/dL (calc) (ref 1.9–3.7)
Glucose, Bld: 148 mg/dL — ABNORMAL HIGH (ref 65–99)
Potassium: 4.5 mmol/L (ref 3.5–5.3)
Sodium: 137 mmol/L (ref 135–146)
Total Bilirubin: 0.8 mg/dL (ref 0.2–1.2)
Total Protein: 6.9 g/dL (ref 6.1–8.1)

## 2020-01-05 LAB — HEMOGLOBIN A1C
Hgb A1c MFr Bld: 7.5 % of total Hgb — ABNORMAL HIGH (ref <5.7)
Mean Plasma Glucose: 169 (calc)
eAG (mmol/L): 9.3 (calc)

## 2020-01-05 LAB — LIPID PANEL
Cholesterol: 205 mg/dL — ABNORMAL HIGH (ref <200)
HDL: 94 mg/dL (ref >=50)
LDL Cholesterol (Calc): 96 mg/dL (calc)
Non-HDL Cholesterol (Calc): 111 mg/dL (calc) (ref <130)
Total CHOL/HDL Ratio: 2.2 (calc) (ref <5.0)
Triglycerides: 66 mg/dL (ref <150)

## 2020-01-05 LAB — TSH: TSH: 1.82 mIU/L (ref 0.40–4.50)

## 2020-01-08 ENCOUNTER — Encounter: Payer: Medicare HMO | Admitting: Family Medicine

## 2020-01-10 NOTE — Addendum Note (Signed)
Addended by: Eual Fines on: 01/10/2020 04:07 PM   Modules accepted: Orders

## 2020-02-27 DIAGNOSIS — E1122 Type 2 diabetes mellitus with diabetic chronic kidney disease: Secondary | ICD-10-CM | POA: Diagnosis not present

## 2020-02-27 DIAGNOSIS — I5032 Chronic diastolic (congestive) heart failure: Secondary | ICD-10-CM | POA: Diagnosis not present

## 2020-02-27 DIAGNOSIS — E559 Vitamin D deficiency, unspecified: Secondary | ICD-10-CM | POA: Diagnosis not present

## 2020-02-27 DIAGNOSIS — I129 Hypertensive chronic kidney disease with stage 1 through stage 4 chronic kidney disease, or unspecified chronic kidney disease: Secondary | ICD-10-CM | POA: Diagnosis not present

## 2020-02-27 DIAGNOSIS — N189 Chronic kidney disease, unspecified: Secondary | ICD-10-CM | POA: Diagnosis not present

## 2020-02-27 DIAGNOSIS — Z79899 Other long term (current) drug therapy: Secondary | ICD-10-CM | POA: Diagnosis not present

## 2020-03-05 DIAGNOSIS — E1122 Type 2 diabetes mellitus with diabetic chronic kidney disease: Secondary | ICD-10-CM | POA: Diagnosis not present

## 2020-03-05 DIAGNOSIS — N189 Chronic kidney disease, unspecified: Secondary | ICD-10-CM | POA: Diagnosis not present

## 2020-03-05 DIAGNOSIS — Z79899 Other long term (current) drug therapy: Secondary | ICD-10-CM | POA: Diagnosis not present

## 2020-03-05 DIAGNOSIS — I129 Hypertensive chronic kidney disease with stage 1 through stage 4 chronic kidney disease, or unspecified chronic kidney disease: Secondary | ICD-10-CM | POA: Diagnosis not present

## 2020-03-05 DIAGNOSIS — I5032 Chronic diastolic (congestive) heart failure: Secondary | ICD-10-CM | POA: Diagnosis not present

## 2020-03-05 DIAGNOSIS — D638 Anemia in other chronic diseases classified elsewhere: Secondary | ICD-10-CM | POA: Diagnosis not present

## 2020-03-22 ENCOUNTER — Other Ambulatory Visit: Payer: Self-pay | Admitting: Family Medicine

## 2020-03-28 DIAGNOSIS — I129 Hypertensive chronic kidney disease with stage 1 through stage 4 chronic kidney disease, or unspecified chronic kidney disease: Secondary | ICD-10-CM | POA: Diagnosis not present

## 2020-03-28 DIAGNOSIS — E1122 Type 2 diabetes mellitus with diabetic chronic kidney disease: Secondary | ICD-10-CM | POA: Diagnosis not present

## 2020-03-28 DIAGNOSIS — N184 Chronic kidney disease, stage 4 (severe): Secondary | ICD-10-CM | POA: Diagnosis not present

## 2020-03-29 LAB — BASIC METABOLIC PANEL WITH GFR
BUN/Creatinine Ratio: 14 (calc) (ref 6–22)
BUN: 24 mg/dL (ref 7–25)
CO2: 26 mmol/L (ref 20–32)
Calcium: 10.3 mg/dL (ref 8.6–10.4)
Chloride: 99 mmol/L (ref 98–110)
Creat: 1.75 mg/dL — ABNORMAL HIGH (ref 0.60–0.93)
GFR, Est African American: 33 mL/min/{1.73_m2} — ABNORMAL LOW (ref >=60)
GFR, Est Non African American: 28 mL/min/{1.73_m2} — ABNORMAL LOW (ref >=60)
Glucose, Bld: 200 mg/dL — ABNORMAL HIGH (ref 65–139)
Potassium: 4.6 mmol/L (ref 3.5–5.3)
Sodium: 134 mmol/L — ABNORMAL LOW (ref 135–146)

## 2020-03-29 LAB — HEMOGLOBIN A1C
Hgb A1c MFr Bld: 7 % of total Hgb — ABNORMAL HIGH (ref <5.7)
Mean Plasma Glucose: 154 (calc)
eAG (mmol/L): 8.5 (calc)

## 2020-04-07 ENCOUNTER — Other Ambulatory Visit: Payer: Self-pay

## 2020-04-07 ENCOUNTER — Ambulatory Visit (INDEPENDENT_AMBULATORY_CARE_PROVIDER_SITE_OTHER): Payer: Medicare HMO | Admitting: Family Medicine

## 2020-04-07 ENCOUNTER — Other Ambulatory Visit: Payer: Self-pay | Admitting: *Deleted

## 2020-04-07 ENCOUNTER — Encounter: Payer: Self-pay | Admitting: Family Medicine

## 2020-04-07 VITALS — BP 138/78 | HR 60 | Temp 98.0°F | Resp 18 | Ht 67.5 in | Wt 161.8 lb

## 2020-04-07 DIAGNOSIS — N184 Chronic kidney disease, stage 4 (severe): Secondary | ICD-10-CM | POA: Diagnosis not present

## 2020-04-07 DIAGNOSIS — G8929 Other chronic pain: Secondary | ICD-10-CM

## 2020-04-07 DIAGNOSIS — Z0000 Encounter for general adult medical examination without abnormal findings: Secondary | ICD-10-CM

## 2020-04-07 DIAGNOSIS — M5441 Lumbago with sciatica, right side: Secondary | ICD-10-CM

## 2020-04-07 DIAGNOSIS — E785 Hyperlipidemia, unspecified: Secondary | ICD-10-CM

## 2020-04-07 DIAGNOSIS — I129 Hypertensive chronic kidney disease with stage 1 through stage 4 chronic kidney disease, or unspecified chronic kidney disease: Secondary | ICD-10-CM

## 2020-04-07 DIAGNOSIS — M5431 Sciatica, right side: Secondary | ICD-10-CM

## 2020-04-07 DIAGNOSIS — I10 Essential (primary) hypertension: Secondary | ICD-10-CM

## 2020-04-07 DIAGNOSIS — E1122 Type 2 diabetes mellitus with diabetic chronic kidney disease: Secondary | ICD-10-CM

## 2020-04-07 MED ORDER — AMLODIPINE BESYLATE 10 MG PO TABS: 10.0000 mg | ORAL_TABLET | Freq: Every day | ORAL | 3 refills | Status: DC

## 2020-04-07 MED ORDER — GLIPIZIDE ER 2.5 MG PO TB24: ORAL_TABLET | ORAL | 1 refills | Status: DC

## 2020-04-07 NOTE — Assessment & Plan Note (Signed)
Increased and uncontrolled, has benefited in the past from epidural will refer

## 2020-04-07 NOTE — Patient Instructions (Addendum)
F/u with MD first week in November, call if you need me sooner  Call and come for flu vaccine in August  We will  obtain eye exam from earlier this year  Foot exam today qualifies you for diabetic shoes   You are referred for epidural injection (s) for right sciatica   Great blood pressure and blood sugar control, no med changes, keep it up   Please get microalb, fasting lipid, cmp and eGFr, hBA1C last week in October  Thanks for choosing [facility], we consider it a privelige to serve you.

## 2020-04-07 NOTE — Progress Notes (Signed)
Mikayla Jenkins     MRN: [id number]      DOB: [birth year]  HPI: Patient is in for annual physical exam. C/o ongoing disabling right sciatica, and requests referral for epidural as this has been helpful in the past. Recent labs, are reviewed. Immunization is reviewed , and  Is up to date   PE: BP 138/78 (BP Location: Left Arm, Patient Position: Sitting, Cuff Size: Normal)   Pulse 60   Temp 98 F (36.7 C) (Oral)   Resp 18   Ht 5' 7.5" (1.715 m)   Wt 161 lb 12.8 oz (73.4 kg)   SpO2 99%   BMI 24.97 kg/m   Pleasant  female, alert and oriented x 3, in no cardio-pulmonary distress. Afebrile. HEENT No facial trauma or asymetry. Sinuses non tender.  Extra occullar muscles intact.. External ears normal, . Neck: supple, no adenopathy,JVD or thyromegaly.No bruits.  Chest: Clear to ascultation bilaterally.No crackles or wheezes. Non tender to palpation  Breast: No asymetry,no masses or lumps. No tenderness. No nipple discharge or inversion. No axillary or supraclavicular adenopathy  Cardiovascular system; Heart sounds normal,  S1 and  S2 ,no S3.  No murmur, or thrill. Apical beat not displaced Peripheral pulses normal.  Abdomen: Soft, non tender, no organomegaly or masses. No bruits. Bowel sounds normal. No guarding, tenderness or rebound.   GU: Not examined, asymptomatic   Musculoskeletal exam: Decreased  ROM of lumbar spine,adequate in  hips , shoulders and knees. No deformity ,swelling or crepitus noted. No muscle wasting or atrophy.   Neurologic: Cranial nerves 2 to 12 intact. Grade 4 power and reduced tone in RLE.  disturbance in gait. No tremor.  Skin: Intact, no ulceration, erythema , scaling or rash noted. Pigmentation normal throughout  Psych; Normal mood and affect. Judgement and concentration normal   Assessment & Plan:  Annual physical exam Annual exam as documented. Counseling done  re healthy lifestyle involving commitment to 150 minutes  exercise per week, heart healthy diet, and attaining healthy weight.The importance of adequate sleep also discussed. Regular seat belt use and home safety, is also discussed. Changes in health habits are decided on by the patient with goals and time frames  set for achieving them. Immunization and cancer screening needs are specifically addressed at this visit.   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 is reminded of the importance of commitment to daily physical activity for 30 minutes or more, as able and the need to limit carbohydrate intake to 30 to 60 grams per meal to help with blood sugar control.   The need to take medication as prescribed, test blood sugar as directed, and to call between visits if there is a concern that blood sugar is uncontrolled is also discussed.   [name] is reminded of the importance of daily foot exam, annual eye examination, and good blood sugar, blood pressure and cholesterol control.  Diabetic Labs Latest Ref Rng & Units 03/28/2020 01/04/2020 11/16/2019 09/24/2019 07/23/2019  HbA1c <5.7 % of total Hgb 7.0(H) 7.5(H) - 6.8(H) -  Microalbumin mg/dL - - - - -  Micro/Creat Ratio <30 mcg/mg creat - - - - -  Chol <200 mg/dL - 205(H) - - -  HDL > OR = 50 mg/dL - 94 - - -  Calc LDL mg/dL (calc) - 96 - - -  Triglycerides <150 mg/dL - 66 - - -  Creatinine 0.60 - 0.93 mg/dL 1.75(H) 1.51(H) 1.49(H) 1.64(H) 1.49(H)   BP/Weight 04/07/2020 12/24/2019 11/17/2019 11/16/2019 10/02/2019 05/22/2019 06/21/91  Systolic BP 330 076 226 - 333 545 625  Diastolic BP 78 64 64 - 74 64 70  Wt. (Lbs) 161.8 167 - 160 166.12 159 159  BMI 24.97 26.16 - 24.33 26.02 24.54 24.54   Foot/eye exam completion dates Latest Ref Rng & Units 04/07/2020 10/19/2018  Eye Exam No Retinopathy - -  Foot Form Completion - Done Done

## 2020-04-07 NOTE — Assessment & Plan Note (Signed)

## 2020-04-11 ENCOUNTER — Encounter: Payer: Self-pay | Admitting: Family Medicine

## 2020-04-11 NOTE — Assessment & Plan Note (Signed)
Controlled, no change in medication Mikayla Jenkins is reminded of the importance of commitment to daily physical activity for 30 minutes or more, as able and the need to limit carbohydrate intake to 30 to 60 grams per meal to help with blood sugar control.   The need to take medication as prescribed, test blood sugar as directed, and to call between visits if there is a concern that blood sugar is uncontrolled is also discussed.   Mikayla Jenkins is reminded of the importance of daily foot exam, annual eye examination, and good blood sugar, blood pressure and cholesterol control.  Diabetic Labs Latest Ref Rng & Units 03/28/2020 01/04/2020 11/16/2019 09/24/2019 07/23/2019  HbA1c <5.7 % of total Hgb 7.0(H) 7.5(H) - 6.8(H) -  Microalbumin mg/dL - - - - -  Micro/Creat Ratio <30 mcg/mg creat - - - - -  Chol <200 mg/dL - 205(H) - - -  HDL > OR = 50 mg/dL - 94 - - -  Calc LDL mg/dL (calc) - 96 - - -  Triglycerides <150 mg/dL - 66 - - -  Creatinine 0.60 - 0.93 mg/dL 1.75(H) 1.51(H) 1.49(H) 1.64(H) 1.49(H)   BP/Weight 04/07/2020 12/24/2019 11/17/2019 11/16/2019 10/02/2019 05/22/2019 11/07/9756  Systolic BP 832 549 826 - 415 830 940  Diastolic BP 78 64 64 - 74 64 70  Wt. (Lbs) 161.8 167 - 160 166.12 159 159  BMI 24.97 26.16 - 24.33 26.02 24.54 24.54   Foot/eye exam completion dates Latest Ref Rng & Units 04/07/2020 10/19/2018  Eye Exam No Retinopathy - -  Foot Form Completion - Done Done

## 2020-04-16 ENCOUNTER — Other Ambulatory Visit: Payer: Self-pay

## 2020-04-16 ENCOUNTER — Ambulatory Visit
Admission: RE | Admit: 2020-04-16 | Discharge: 2020-04-16 | Disposition: A | Discharge status: Home / Self Care | Payer: Medicare HMO | Source: Ambulatory Visit | Attending: Family Medicine | Admitting: Family Medicine

## 2020-04-16 DIAGNOSIS — M5431 Sciatica, right side: Secondary | ICD-10-CM

## 2020-04-16 DIAGNOSIS — M545 Low back pain: Secondary | ICD-10-CM | POA: Diagnosis not present

## 2020-04-16 MED ORDER — METHYLPREDNISOLONE ACETATE 40 MG/ML INJ SUSP (RADIOLOG
120.0000 mg | Freq: Once | INTRAMUSCULAR | Status: AC
  Administered 2020-04-16: 120 mg via EPIDURAL

## 2020-04-16 MED ORDER — IOPAMIDOL (ISOVUE-M 200) INJECTION 41%
1.0000 mL | Freq: Once | INTRAMUSCULAR | Status: AC
  Administered 2020-04-16: 1 mL via EPIDURAL

## 2020-04-16 NOTE — Discharge Instructions (Signed)

## 2020-04-24 ENCOUNTER — Encounter: Payer: Medicare HMO | Admitting: Family Medicine

## 2020-05-22 ENCOUNTER — Other Ambulatory Visit: Payer: Self-pay

## 2020-05-22 ENCOUNTER — Encounter: Payer: Self-pay | Admitting: Family Medicine

## 2020-05-22 ENCOUNTER — Encounter: Payer: Self-pay | Admitting: *Deleted

## 2020-05-22 ENCOUNTER — Telehealth (INDEPENDENT_AMBULATORY_CARE_PROVIDER_SITE_OTHER): Payer: Medicare HMO | Admitting: Family Medicine

## 2020-05-22 VITALS — BP 143/74 | Ht 67.5 in | Wt 161.0 lb

## 2020-05-22 DIAGNOSIS — Z0000 Encounter for general adult medical examination without abnormal findings: Secondary | ICD-10-CM

## 2020-05-22 NOTE — Patient Instructions (Signed)
Mikayla Jenkins , Thank you for taking time to come for your Medicare Wellness Visit. I appreciate your ongoing commitment to your health goals. Please review the following plan we discussed and let me know if I can assist you in the future.   Please continue to practice social distancing to keep you, your family, and our community safe.  If you must go out, please wear a Mask and practice good handwashing.  Screening recommendations/referrals: Colonoscopy: up to date Mammogram: up to date Bone Density: up to date Recommended yearly ophthalmology/optometry visit for glaucoma screening and checkup Recommended yearly dental visit for hygiene and checkup  Vaccinations: up to date    Advanced directives: no paperwork  Conditions/risks identified: Falls   Next appointment: 08/18/2020  Preventive Care 18 Years and Older, Female Preventive care refers to lifestyle choices and visits with your health care provider that can promote health and wellness. What does preventive care include?  A yearly physical exam. This is also called an annual well check.  Dental exams once or twice a year.  Routine eye exams. Ask your health care provider how often you should have your eyes checked.  Personal lifestyle choices, including:  Daily care of your teeth and gums.  Regular physical activity.  Eating a healthy diet.  Avoiding tobacco and drug use.  Limiting alcohol use.  Practicing safe sex.  Taking low-dose aspirin every day.  Taking vitamin and mineral supplements as recommended by your health care provider. What happens during an annual well check? The services and screenings done by your health care provider during your annual well check will depend on your age, overall health, lifestyle risk factors, and family history of disease. Counseling  Your health care provider may ask you questions about your:  Alcohol use.  Tobacco use.  Drug use.  Emotional well-being.  Home and  relationship well-being.  Sexual activity.  Eating habits.  History of falls.  Memory and ability to understand (cognition).  Work and work Statistician.  Reproductive health. Screening  You may have the following tests or measurements:  Height, weight, and BMI.  Blood pressure.  Lipid and cholesterol levels. These may be checked every 5 years, or more frequently if you are over 70 years old.  Skin check.  Lung cancer screening. You may have this screening every year starting at age 75 if you have a 30-pack-year history of smoking and currently smoke or have quit within the past 15 years.  Fecal occult blood test (FOBT) of the stool. You may have this test every year starting at age 49.  Flexible sigmoidoscopy or colonoscopy. You may have a sigmoidoscopy every 5 years or a colonoscopy every 10 years starting at age 68.  Hepatitis C blood test.  Hepatitis B blood test.  Sexually transmitted disease (STD) testing.  Diabetes screening. This is done by checking your blood sugar (glucose) after you have not eaten for a while (fasting). You may have this done every 1-3 years.  Bone density scan. This is done to screen for osteoporosis. You may have this done starting at age 60.  Mammogram. This may be done every 1-2 years. Talk to your health care provider about how often you should have regular mammograms. Talk with your health care provider about your test results, treatment options, and if necessary, the need for more tests. Vaccines  Your health care provider may recommend certain vaccines, such as:  Influenza vaccine. This is recommended every year.  Tetanus, diphtheria, and acellular pertussis (Tdap,  Td) vaccine. You may need a Td booster every 10 years.  Zoster vaccine. You may need this after age 31.  Pneumococcal 13-valent conjugate (PCV13) vaccine. One dose is recommended after age 13.  Pneumococcal polysaccharide (PPSV23) vaccine. One dose is recommended after  age 49. Talk to your health care provider about which screenings and vaccines you need and how often you need them. This information is not intended to replace advice given to you by your health care provider. Make sure you discuss any questions you have with your health care provider. Document Released: 10/31/2015 Document Revised: 06/23/2016 Document Reviewed: 08/05/2015 Elsevier Interactive Patient Education  2017 [employer] Prevention in the Home Falls can cause injuries. They can happen to people of all ages. There are many things you can do to make your home safe and to help prevent falls. What can I do on the outside of my home?  Regularly fix the edges of walkways and driveways and fix any cracks.  Remove anything that might make you trip as you walk through a door, such as a raised step or threshold.  Trim any bushes or trees on the path to your home.  Use bright outdoor lighting.  Clear any walking paths of anything that might make someone trip, such as rocks or tools.  Regularly check to see if handrails are loose or broken. Make sure that both sides of any steps have handrails.  Any raised decks and porches should have guardrails on the edges.  Have any leaves, snow, or ice cleared regularly.  Use sand or salt on walking paths during winter.  Clean up any spills in your garage right away. This includes oil or grease spills. What can I do in the bathroom?  Use night lights.  Install grab bars by the toilet and in the tub and shower. Do not use towel bars as grab bars.  Use non-skid mats or decals in the tub or shower.  If you need to sit down in the shower, use a plastic, non-slip stool.  Keep the floor dry. Clean up any water that spills on the floor as soon as it happens.  Remove soap buildup in the tub or shower regularly.  Attach bath mats securely with double-sided non-slip rug tape.  Do not have throw rugs and other things on the floor that can  make you trip. What can I do in the bedroom?  Use night lights.  Make sure that you have a light by your bed that is easy to reach.  Do not use any sheets or blankets that are too big for your bed. They should not hang down onto the floor.  Have a firm chair that has side arms. You can use this for support while you get dressed.  Do not have throw rugs and other things on the floor that can make you trip. What can I do in the kitchen?  Clean up any spills right away.  Avoid walking on wet floors.  Keep items that you use a lot in easy-to-reach places.  If you need to reach something above you, use a strong step stool that has a grab bar.  Keep electrical cords out of the way.  Do not use floor polish or wax that makes floors slippery. If you must use wax, use non-skid floor wax.  Do not have throw rugs and other things on the floor that can make you trip. What can I do with my stairs?  Do not  leave any items on the stairs.  Make sure that there are handrails on both sides of the stairs and use them. Fix handrails that are broken or loose. Make sure that handrails are as long as the stairways.  Check any carpeting to make sure that it is firmly attached to the stairs. Fix any carpet that is loose or worn.  Avoid having throw rugs at the top or bottom of the stairs. If you do have throw rugs, attach them to the floor with carpet tape.  Make sure that you have a light switch at the top of the stairs and the bottom of the stairs. If you do not have them, ask someone to add them for you. What else can I do to help prevent falls?  Wear shoes that:  Do not have high heels.  Have rubber bottoms.  Are comfortable and fit you well.  Are closed at the toe. Do not wear sandals.  If you use a stepladder:  Make sure that it is fully opened. Do not climb a closed stepladder.  Make sure that both sides of the stepladder are locked into place.  Ask someone to hold it for you,  if possible.  Clearly mark and make sure that you can see:  Any grab bars or handrails.  First and last steps.  Where the edge of each step is.  Use tools that help you move around (mobility aids) if they are needed. These include:  Canes.  Walkers.  Scooters.  Crutches.  Turn on the lights when you go into a dark area. Replace any light bulbs as soon as they burn out.  Set up your furniture so you have a clear path. Avoid moving your furniture around.  If any of your floors are uneven, fix them.  If there are any pets around you, be aware of where they are.  Review your medicines with your doctor. Some medicines can make you feel dizzy. This can increase your chance of falling. Ask your doctor what other things that you can do to help prevent falls. This information is not intended to replace advice given to you by your health care provider. Make sure you discuss any questions you have with your health care provider. Document Released: 07/31/2009 Document Revised: 03/11/2016 Document Reviewed: 11/08/2014 Elsevier Interactive Patient Education  2017 [employer].

## 2020-05-22 NOTE — Progress Notes (Signed)
Subjective:   Mikayla Jenkins is a 74 y.o. female who presents for Medicare Annual (Subsequent) preventive examination.  Location of Patient: Home Location of Provider: Telephone Consent was obtain for visit to be over via telehealth.  I verified that I am speaking with the correct person using two identifiers.   Review of Systems    yes Cardiac Risk Factors include: none     Objective:    Today's Vitals   05/22/20 0943 05/22/20 0953 05/22/20 1010  BP: (!) 143/74    Weight: 161 lb (73 kg)    Height: 5' 7.5" (1.715 m)    PainSc: 0-No pain 0-No pain 0-No pain   Body mass index is 24.84 kg/m.  Advanced Directives 05/22/2020 11/16/2019 05/16/2018 05/02/2017 06/13/2015  Does Patient Have a Medical Advance Directive? No No Yes No No  Does patient want to make changes to medical advance directive? - - Yes (ED - Information included in AVS) - -  Would patient like information on creating a medical advance directive? No - Patient declined - - Yes (MAU/Ambulatory/Procedural Areas - Information given) No - patient declined information    Current Medications (verified) Outpatient Encounter Medications as of 05/22/2020  Medication Sig   amLODipine (NORVASC) 10 MG tablet Take 1 tablet (10 mg total) by mouth daily.   aspirin (ASPIRIN LOW DOSE) 81 MG EC tablet Take 81 mg by mouth daily. Take 1 tablet by mouth once a day    atenolol-chlorthalidone (TENORETIC) 100-25 MG tablet TAKE 1 TABLET EVERY DAY   benazepril (LOTENSIN) 40 MG tablet TAKE 1 TABLET EVERY DAY   blood glucose meter kit and supplies Dispense based on patient and insurance preference. Use up to four times daily as directed. (FOR ICD-10 E10.9, E11.9).   Calcium Carbonate-Vitamin D (CALCIUM 600/VITAMIN D) 600-400 MG-UNIT per tablet Take 1 tablet by mouth daily.   cloNIDine (CATAPRES) 0.3 MG tablet Take 1 and 1/2 tablets by mouth at bedtime   glipiZIDE (GLUCOTROL XL) 2.5 MG 24 hr tablet Take one tablet with breakfast every  Tuesday, Thursday and Saturday , by mouth   glucose blood (ACCU-CHEK AVIVA PLUS) test strip TEST BLOOD SUGAR UP TO FOUR TIMES DAILY AS DIRECTED   HYDROcodone-acetaminophen (NORCO/VICODIN) 5-325 MG tablet Take 1 tablet by mouth as needed for moderate pain.   lovastatin (MEVACOR) 40 MG tablet Take 1 tablet (40 mg total) by mouth daily.   Multiple Vitamin (MULTIVITAMIN) tablet Take 1 tablet by mouth daily.   spironolactone (ALDACTONE) 25 MG tablet Take 1 tablet (25 mg total) by mouth daily. (Patient taking differently: Take 12.5 mg by mouth every other day. )   No facility-administered encounter medications on file as of 05/22/2020.    Allergies (verified) Metformin and related   History: Past Medical History:  Diagnosis Date   Diabetes mellitus type II    Hyperlipidemia    Hypertension    Renal disorder    Past Surgical History:  Procedure Laterality Date   ABDOMINAL HYSTERECTOMY     COLONOSCOPY N/A 06/13/2015   Procedure: COLONOSCOPY;  Surgeon: Rogene Houston, MD;  Location: [facility];  Service: Endoscopy;  Laterality: N/A;  930-rescheduled 8/26 @ 8:30am Ann to notify pt   HERNIA REPAIR     inguinal herniorrhapy right     right eye surgery secondary to right eye weakness     SALPINGOOPHORECTOMY     TUBAL LIGATION     Family History  Problem Relation Age of Onset   Pneumonia Brother  on continuous O2   Stroke Brother    Stroke Sister    COPD Sister    Diabetes Sister    Stroke Sister    Hypertension Mother    Diabetes Mother    Fibroids Daughter        uterine   Social History   Socioeconomic History   Marital status: Married    Spouse name: Not on file   Number of children: Not on file   Years of education: Not on file   Highest education level: High school graduate  Occupational History   Not on file  Tobacco Use   Smoking status: Never Smoker   Smokeless tobacco: Never Used  Vaping Use   Vaping Use: Never used   Substance and Sexual Activity   Alcohol use: No   Drug use: No   Sexual activity: Yes    Birth control/protection: Surgical  Other Topics Concern   Not on file  Social History Narrative   Not on file   Social Determinants of Health   Financial Resource Strain: Low Risk    Difficulty of Paying Living Expenses: Not hard at all  Food Insecurity: No Food Insecurity   Worried About Charity fundraiser in the Last Year: Never true   [place] in the Last Year: Never true  Transportation Needs: No Transportation Needs   Lack of Transportation (Medical): No   Lack of Transportation (Non-Medical): No  Physical Activity: Insufficiently Active   Days of Exercise per Week: 3 days   Minutes of Exercise per Session: 20 min  Stress: No Stress Concern Present   Feeling of Stress : Not at all  Social Connections: Moderately Integrated   Frequency of Communication with Friends and Family: More than three times a week   Frequency of Social Gatherings with Friends and Family: More than three times a week   Attends Religious Services: More than 4 times per year   Active Member of [employer] or Organizations: No   Attends Music therapist: Never   Marital Status: Married    Tobacco Counseling Counseling given: Not Answered   Clinical Intake:  Pre-visit preparation completed: Yes  Pain : No/denies pain Pain Score: 0-No pain     Nutritional Status: BMI of 19-24  Normal Nutritional Risks: None Diabetes: Yes CBG done?: No Did pt. bring in CBG monitor from home?: No  How often do you need to have someone help you when you read instructions, pamphlets, or other written materials from your doctor or pharmacy?: 1 - Never What is the last grade level you completed in school?: [place]  Diabetic?yes  Interpreter Needed?: No      Activities of Daily Living In your present state of health, do you have any difficulty performing the following  activities: 05/22/2020  Hearing? N  Vision? N  Difficulty concentrating or making decisions? N  Walking or climbing stairs? N  Dressing or bathing? N  Doing errands, shopping? N  Preparing Food and eating ? N  Using the Toilet? N  In the past six months, have you accidently leaked urine? N  Do you have problems with loss of bowel control? N  Managing your Medications? N  Managing your Finances? N  Housekeeping or managing your Housekeeping? N  Some recent data might be hidden    Patient Care Team: Fayrene Helper, MD as PCP - General Herminio Commons, MD (Inactive) as PCP - Cardiology (Cardiology)  Indicate any recent  Medical Services you may have received from other than [facility] providers in the past year (date may be approximate).     Assessment:   This is a routine wellness examination for [facility].  Hearing/Vision screen No exam data present  Dietary issues and exercise activities discussed: Current Exercise Habits: Home exercise routine, Time (Minutes): 20, Frequency (Times/Week): 3, Weekly Exercise (Minutes/Week): 60, Intensity: Mild, Exercise limited by: None identified  Goals     Have 3 meals a day      Depression Screen PHQ 2/9 Scores 05/22/2020 04/07/2020 05/22/2019 05/14/2019 01/25/2019 08/14/2018 05/16/2018  PHQ - 2 Score 0 0 0 0 0 0 0  PHQ- 9 Score - - - - - - -    Fall Risk Fall Risk  05/22/2020 04/07/2020 10/02/2019 05/22/2019 05/14/2019  Falls in the past year? 0 0 0 0 0  Number falls in past yr: 0 0 0 - -  Injury with Fall? 0 0 0 0 0  Risk for fall due to : No Fall Risks - - - -  Follow up Falls evaluation completed Falls evaluation completed - - -    Any stairs in or around the home? No  If so, are there any without handrails? No  Home free of loose throw rugs in walkways, pet beds, electrical cords, etc? Yes  Adequate lighting in your home to reduce risk of falls? Yes   ASSISTIVE DEVICES UTILIZED TO PREVENT FALLS:  Life alert? Yes  Use of a cane, walker  or w/c? No  Grab bars in the bathroom? No  Shower chair or bench in shower? No  Elevated toilet seat or a handicapped toilet? No   TIMED UP AND GO:  Was the test performed? No .  Length of time to ambulate 10 feet: NA sec.     Cognitive Function:     6CIT Screen 05/22/2020 05/22/2019 05/16/2018 05/02/2017  What Year? 0 points 0 points 0 points 0 points  What month? 0 points 0 points 0 points 0 points  What time? 0 points 0 points 0 points 0 points  Count back from 20 0 points 0 points 0 points 0 points  Months in reverse 0 points 0 points 0 points 0 points  Repeat phrase 0 points 0 points 2 points 0 points  Total Score 0 0 2 0    Immunizations Immunization History  Administered Date(s) Administered   Fluad Quad(high Dose 65+) 07/02/2019   Influenza Split 08/11/2011, 08/23/2012   Influenza, High Dose Seasonal PF 08/14/2018   Influenza,inj,Quad PF,6+ Mos 08/10/2013, 08/10/2014, 06/30/2015, 07/12/2016, 08/02/2017   Moderna SARS-COVID-2 Vaccination 01/22/2020, 02/19/2020   Pneumococcal Conjugate-13 05/07/2014   Pneumococcal Polysaccharide-23 09/11/2007, 08/11/2011   Tdap 04/15/2011    TDAP status: Up to date Flu Vaccine status: Up to date Pneumococcal vaccine status: Up to date Covid-19 vaccine status: Completed vaccines  Qualifies for Shingles Vaccine? Yes   Zostavax completed Yes   Shingrix Completed?: Yes  Screening Tests Health Maintenance  Topic Date Due   INFLUENZA VACCINE  05/18/2020   HEMOGLOBIN A1C  09/27/2020   OPHTHALMOLOGY EXAM  12/25/2020   FOOT EXAM  04/11/2021   TETANUS/TDAP  04/14/2021   MAMMOGRAM  09/23/2021   COLONOSCOPY  06/12/2025   DEXA SCAN  Completed   COVID-19 Vaccine  Completed   Hepatitis C Screening  Completed   PNA vac Low Risk Adult  Completed    Health Maintenance  Health Maintenance Due  Topic Date Due   INFLUENZA VACCINE  05/18/2020  Colorectal cancer screening: Completed 06-12-20. Repeat every 10  years Mammogram status: Completed 09-24-19. Repeat every year Bone Density status: Completed 05-25-18. Results reflect: Bone density results: NORMAL. Repeat every 5 years.  Lung Cancer Screening: (Low Dose CT Chest recommended if Age 27-80 years, 30 pack-year currently smoking OR have quit w/in 15years.) does not qualify.   Lung Cancer Screening Referral: no  Additional Screening:  Hepatitis C Screening: does not qualify;   Vision Screening: Recommended annual ophthalmology exams for early detection of glaucoma and other disorders of the eye. Is the patient up to date with their annual eye exam?  Yes  Who is the provider or what is the name of the office in which the patient attends annual eye exams? Dr Jorja Loa [facility]  If pt is not established with a provider, would they like to be referred to a provider to establish care? No .   Dental Screening: Recommended annual dental exams for proper oral hygiene  Community Resource Referral / Chronic Care Management: CRR required this visit?  No   CCM required this visit?  No      Plan:     1. Encounter for Medicare annual wellness exam   I have personally reviewed and noted the following in the patients chart:    Medical and social history  Use of alcohol, tobacco or illicit drugs   Current medications and supplements  Functional ability and status  Nutritional status  Physical activity  Advanced directives  List of other physicians  Hospitalizations, surgeries, and ER visits in previous 12 months  Vitals  Screenings to include cognitive, depression, and falls  Referrals and appointments  In addition, I have reviewed and discussed with patient certain preventive protocols, quality metrics, and best practice recommendations. A written personalized care plan for preventive services as well as general preventive health recommendations were provided to patient.     Perlie Mayo, NP   05/22/2020   I  provided 20 minutes of non-face-to-face time during this encounter.

## 2020-05-30 ENCOUNTER — Other Ambulatory Visit: Payer: Self-pay | Admitting: Family Medicine

## 2020-05-30 DIAGNOSIS — I10 Essential (primary) hypertension: Secondary | ICD-10-CM

## 2020-05-30 DIAGNOSIS — E785 Hyperlipidemia, unspecified: Secondary | ICD-10-CM

## 2020-06-04 DIAGNOSIS — I5032 Chronic diastolic (congestive) heart failure: Secondary | ICD-10-CM | POA: Diagnosis not present

## 2020-06-04 DIAGNOSIS — E1122 Type 2 diabetes mellitus with diabetic chronic kidney disease: Secondary | ICD-10-CM | POA: Diagnosis not present

## 2020-06-04 DIAGNOSIS — N189 Chronic kidney disease, unspecified: Secondary | ICD-10-CM | POA: Diagnosis not present

## 2020-06-04 DIAGNOSIS — I129 Hypertensive chronic kidney disease with stage 1 through stage 4 chronic kidney disease, or unspecified chronic kidney disease: Secondary | ICD-10-CM | POA: Diagnosis not present

## 2020-06-04 DIAGNOSIS — D638 Anemia in other chronic diseases classified elsewhere: Secondary | ICD-10-CM | POA: Diagnosis not present

## 2020-06-04 DIAGNOSIS — Z79899 Other long term (current) drug therapy: Secondary | ICD-10-CM | POA: Diagnosis not present

## 2020-06-11 DIAGNOSIS — N189 Chronic kidney disease, unspecified: Secondary | ICD-10-CM | POA: Diagnosis not present

## 2020-06-11 DIAGNOSIS — I129 Hypertensive chronic kidney disease with stage 1 through stage 4 chronic kidney disease, or unspecified chronic kidney disease: Secondary | ICD-10-CM | POA: Diagnosis not present

## 2020-06-11 DIAGNOSIS — D638 Anemia in other chronic diseases classified elsewhere: Secondary | ICD-10-CM | POA: Diagnosis not present

## 2020-06-11 DIAGNOSIS — E1122 Type 2 diabetes mellitus with diabetic chronic kidney disease: Secondary | ICD-10-CM | POA: Diagnosis not present

## 2020-06-11 DIAGNOSIS — I5032 Chronic diastolic (congestive) heart failure: Secondary | ICD-10-CM | POA: Diagnosis not present

## 2020-08-13 DIAGNOSIS — I10 Essential (primary) hypertension: Secondary | ICD-10-CM | POA: Diagnosis not present

## 2020-08-13 DIAGNOSIS — N184 Chronic kidney disease, stage 4 (severe): Secondary | ICD-10-CM | POA: Diagnosis not present

## 2020-08-13 DIAGNOSIS — I129 Hypertensive chronic kidney disease with stage 1 through stage 4 chronic kidney disease, or unspecified chronic kidney disease: Secondary | ICD-10-CM | POA: Diagnosis not present

## 2020-08-13 DIAGNOSIS — E1122 Type 2 diabetes mellitus with diabetic chronic kidney disease: Secondary | ICD-10-CM | POA: Diagnosis not present

## 2020-08-13 DIAGNOSIS — E785 Hyperlipidemia, unspecified: Secondary | ICD-10-CM | POA: Diagnosis not present

## 2020-08-14 LAB — LIPID PANEL
Cholesterol: 209 mg/dL — ABNORMAL HIGH (ref <200)
HDL: 100 mg/dL (ref >=50)
LDL Cholesterol (Calc): 89 mg/dL (calc)
Non-HDL Cholesterol (Calc): 109 mg/dL (calc) (ref <130)
Total CHOL/HDL Ratio: 2.1 (calc) (ref <5.0)
Triglycerides: 107 mg/dL (ref <150)

## 2020-08-14 LAB — COMPLETE METABOLIC PANEL WITH GFR
AG Ratio: 1.7 (calc) (ref 1.0–2.5)
ALT: 14 U/L (ref 6–29)
AST: 20 U/L (ref 10–35)
Albumin: 4.5 g/dL (ref 3.6–5.1)
Alkaline phosphatase (APISO): 79 U/L (ref 37–153)
BUN/Creatinine Ratio: 14 (calc) (ref 6–22)
BUN: 20 mg/dL (ref 7–25)
CO2: 26 mmol/L (ref 20–32)
Calcium: 10.1 mg/dL (ref 8.6–10.4)
Chloride: 101 mmol/L (ref 98–110)
Creat: 1.38 mg/dL — ABNORMAL HIGH (ref 0.60–0.93)
GFR, Est African American: 43 mL/min/{1.73_m2} — ABNORMAL LOW (ref >=60)
GFR, Est Non African American: 37 mL/min/{1.73_m2} — ABNORMAL LOW (ref >=60)
Globulin: 2.6 g/dL (calc) (ref 1.9–3.7)
Glucose, Bld: 159 mg/dL — ABNORMAL HIGH (ref 65–99)
Potassium: 4.4 mmol/L (ref 3.5–5.3)
Sodium: 137 mmol/L (ref 135–146)
Total Bilirubin: 0.8 mg/dL (ref 0.2–1.2)
Total Protein: 7.1 g/dL (ref 6.1–8.1)

## 2020-08-14 LAB — MICROALBUMIN, URINE: Microalb, Ur: 0.6 mg/dL

## 2020-08-14 LAB — HEMOGLOBIN A1C
Hgb A1c MFr Bld: 7.3 % of total Hgb — ABNORMAL HIGH (ref <5.7)
Mean Plasma Glucose: 163 (calc)
eAG (mmol/L): 9 (calc)

## 2020-08-18 ENCOUNTER — Ambulatory Visit (INDEPENDENT_AMBULATORY_CARE_PROVIDER_SITE_OTHER): Payer: Medicare HMO | Admitting: Family Medicine

## 2020-08-18 ENCOUNTER — Encounter: Payer: Self-pay | Admitting: Family Medicine

## 2020-08-18 ENCOUNTER — Other Ambulatory Visit: Payer: Self-pay

## 2020-08-18 VITALS — BP 128/76 | HR 82 | Resp 16 | Wt 162.0 lb

## 2020-08-18 DIAGNOSIS — E1121 Type 2 diabetes mellitus with diabetic nephropathy: Secondary | ICD-10-CM | POA: Diagnosis not present

## 2020-08-18 DIAGNOSIS — E785 Hyperlipidemia, unspecified: Secondary | ICD-10-CM

## 2020-08-18 DIAGNOSIS — E559 Vitamin D deficiency, unspecified: Secondary | ICD-10-CM

## 2020-08-18 DIAGNOSIS — I10 Essential (primary) hypertension: Secondary | ICD-10-CM | POA: Diagnosis not present

## 2020-08-18 DIAGNOSIS — Z23 Encounter for immunization: Secondary | ICD-10-CM

## 2020-08-18 DIAGNOSIS — M1611 Unilateral primary osteoarthritis, right hip: Secondary | ICD-10-CM | POA: Diagnosis not present

## 2020-08-18 MED ORDER — SPIRONOLACTONE 25 MG PO TABS: ORAL_TABLET | ORAL | 3 refills | Status: DC

## 2020-08-18 NOTE — Assessment & Plan Note (Signed)
uncontrolled needs to reduce fried and fatty foods Hyperlipidemia:Low fat diet discussed and encouraged.   Lipid Panel  Lab Results  Component Value Date   CHOL 209 (H) 08/13/2020   HDL 100 08/13/2020   LDLCALC 89 08/13/2020   TRIG 107 08/13/2020   CHOLHDL 2.1 08/13/2020

## 2020-08-18 NOTE — Assessment & Plan Note (Signed)
Controlled, no change in medication DASH diet and commitment to daily physical activity for a minimum of 30 minutes discussed and encouraged, as a part of hypertension management. The importance of attaining a healthy weight is also discussed.  BP/Weight 08/18/2020 05/22/2020 04/16/2020 04/07/2020 12/24/2019 11/17/2019 0/41/5930  Systolic BP 123 799 094 000 505 678 -  Diastolic BP 76 74 74 78 64 64 -  Wt. (Lbs) 162 161 - 161.8 167 - 160  BMI 25 24.84 - 24.97 26.16 - 24.33

## 2020-08-18 NOTE — Assessment & Plan Note (Signed)
Controlled, no change in medication Mikayla Jenkins is reminded of the importance of commitment to daily physical activity for 30 minutes or more, as able and the need to limit carbohydrate intake to 30 to 60 grams per meal to help with blood sugar control.   The need to take medication as prescribed, test blood sugar as directed, and to call between visits if there is a concern that blood sugar is uncontrolled is also discussed.   Mikayla Jenkins is reminded of the importance of daily foot exam, annual eye examination, and good blood sugar, blood pressure and cholesterol control.  Diabetic Labs Latest Ref Rng & Units 08/13/2020 03/28/2020 01/04/2020 11/16/2019 09/24/2019  HbA1c <5.7 % of total Hgb 7.3(H) 7.0(H) 7.5(H) - 6.8(H)  Microalbumin mg/dL 0.6 - - - -  Micro/Creat Ratio <30 mcg/mg creat - - - - -  Chol <200 mg/dL 209(H) - 205(H) - -  HDL > OR = 50 mg/dL 100 - 94 - -  Calc LDL mg/dL (calc) 89 - 96 - -  Triglycerides <150 mg/dL 107 - 66 - -  Creatinine 0.60 - 0.93 mg/dL 1.38(H) 1.75(H) 1.51(H) 1.49(H) 1.64(H)   BP/Weight 08/18/2020 05/22/2020 04/16/2020 04/07/2020 12/24/2019 11/17/2019 4/84/0397  Systolic BP 953 692 230 097 949 971 -  Diastolic BP 76 74 74 78 64 64 -  Wt. (Lbs) 162 161 - 161.8 167 - 160  BMI 25 24.84 - 24.97 26.16 - 24.33   Foot/eye exam completion dates Latest Ref Rng & Units 04/07/2020 12/26/2019  Eye Exam No Retinopathy - No Retinopathy  Foot Form Completion - Done -

## 2020-08-18 NOTE — Assessment & Plan Note (Signed)
Increased pain and stiffness of right hip worse in past 6 months, established severe osteo since 09/2017, refer to Ortho

## 2020-08-18 NOTE — Progress Notes (Signed)
Mikayla Jenkins     MRN: [id number]      DOB: 03/27/1946   HPI Mikayla Jenkins is here for follow up and re-evaluation of chronic medical conditions, medication management and review of any available recent lab and radiology data.  Preventive health is updated, specifically  Cancer screening and Immunization.   C/o increased and worsening right hip pain and stiffness needs to see Orthopedics The PT denies any adverse reactions to current medications since the last visit.  Denies polyuria, polydipsia, blurred vision , or hypoglycemic episodes.   ROS Denies recent fever or chills. Denies sinus pressure, nasal congestion, ear pain or sore throat. Denies chest congestion, productive cough or wheezing. Denies chest pains, palpitations and leg swelling Denies abdominal pain, nausea, vomiting,diarrhea or constipation.   Denies dysuria, frequency, hesitancy or incontinence.  Denies headaches, seizures, numbness, or tingling. Denies depression, anxiety or insomnia. Denies skin break down or rash.   PE  BP 128/76   Pulse 82   Resp 16   Wt 162 lb (73.5 kg)   SpO2 97%   BMI 25.00 kg/m   Patient alert and oriented and in no cardiopulmonary distress.  HEENT: No facial asymmetry, EOMI,     Neck supple .  Chest: Clear to auscultation bilaterally.  CVS: S1, S2 no murmurs, no S3.Regular rate.  ABD: Soft non tender.   Ext: No edema  GE:ZMOQHUTML ROM spine,  And right hip, normal in  knees.  Skin: Intact, no ulcerations or rash noted.  Psych: Good eye contact, normal affect. Memory intact not anxious or depressed appearing.  CNS: CN 2-12 intact, power,  normal throughout.no focal deficits noted.   Assessment & Plan  Osteoarthritis of right hip Increased pain and stiffness of right hip worse in past 6 months, established severe osteo since 09/2017, refer to Ortho   Malignant hypertension Controlled, no change in medication DASH diet and commitment to daily physical activity for a  minimum of 30 minutes discussed and encouraged, as a part of hypertension management. The importance of attaining a healthy weight is also discussed.  BP/Weight 08/18/2020 05/22/2020 04/16/2020 04/07/2020 12/24/2019 11/17/2019 4/65/0354  Systolic BP 656 812 751 700 174 944 -  Diastolic BP 76 74 74 78 64 64 -  Wt. (Lbs) 162 161 - 161.8 167 - 160  BMI 25 24.84 - 24.97 26.16 - 24.33       Hyperlipidemia LDL goal <100 uncontrolled needs to reduce fried and fatty foods Hyperlipidemia:Low fat diet discussed and encouraged.   Lipid Panel  Lab Results  Component Value Date   CHOL 209 (H) 08/13/2020   HDL 100 08/13/2020   LDLCALC 89 08/13/2020   TRIG 107 08/13/2020   CHOLHDL 2.1 08/13/2020       Type 2 diabetes mellitus with nephropathy (HCC) Controlled, no change in medication Mikayla Jenkins is reminded of the importance of commitment to daily physical activity for 30 minutes or more, as able and the need to limit carbohydrate intake to 30 to 60 grams per meal to help with blood sugar control.   The need to take medication as prescribed, test blood sugar as directed, and to call between visits if there is a concern that blood sugar is uncontrolled is also discussed.   Mikayla Jenkins is reminded of the importance of daily foot exam, annual eye examination, and good blood sugar, blood pressure and cholesterol control.  Diabetic Labs Latest Ref Rng & Units 08/13/2020 03/28/2020 01/04/2020 11/16/2019 09/24/2019  HbA1c <5.7 % of total  Hgb 7.3(H) 7.0(H) 7.5(H) - 6.8(H)  Microalbumin mg/dL 0.6 - - - -  Micro/Creat Ratio <30 mcg/mg creat - - - - -  Chol <200 mg/dL 209(H) - 205(H) - -  HDL > OR = 50 mg/dL 100 - 94 - -  Calc LDL mg/dL (calc) 89 - 96 - -  Triglycerides <150 mg/dL 107 - 66 - -  Creatinine 0.60 - 0.93 mg/dL 1.38(H) 1.75(H) 1.51(H) 1.49(H) 1.64(H)   BP/Weight 08/18/2020 05/22/2020 04/16/2020 04/07/2020 12/24/2019 11/17/2019 04/25/6282  Systolic BP 662 947 654 650 354 656 -  Diastolic BP 76 74 74 78 64  64 -  Wt. (Lbs) 162 161 - 161.8 167 - 160  BMI 25 24.84 - 24.97 26.16 - 24.33   Foot/eye exam completion dates Latest Ref Rng & Units 04/07/2020 12/26/2019  Eye Exam No Retinopathy - No Retinopathy  Foot Form Completion - Done -

## 2020-08-18 NOTE — Patient Instructions (Addendum)
F/U in office with MD in mid  March, call iof you need me sooner  Flu vaccine today  Congrats on excellent blood pressure, blood sugar and work on lipids,  You are referred to Dr Ernestina Patches, we will get the appointment for you and notify you  Fasting lipid, cmp and eGFr, Vit d, cBC and TSH 3 days before next visit  It is important that you exercise regularly at least 30 minutes 5 times a week. If you develop chest pain, have severe difficulty breathing, or feel very tired, stop exercising immediately and seek medical attention  Think about what you will eat, plan ahead. Choose " clean, green, fresh or frozen" over canned, processed or packaged foods which are more sugary, salty and fatty. 70 to 75% of food eaten should be vegetables and fruit. Three meals at set times with snacks allowed between meals, but they must be fruit or vegetables. Aim to eat over a 12 hour period , example 7 am to 7 pm, and STOP after  your last meal of the day. Drink water,generally about 64 ounces per day, no other drink is as healthy. Fruit juice is best enjoyed in a healthy way, by EATING the fruit. Thanks for choosing [facility], we consider it a privelige to serve you.

## 2020-08-20 ENCOUNTER — Other Ambulatory Visit (HOSPITAL_COMMUNITY): Payer: Self-pay | Admitting: Family Medicine

## 2020-08-20 DIAGNOSIS — Z1231 Encounter for screening mammogram for malignant neoplasm of breast: Secondary | ICD-10-CM

## 2020-09-01 ENCOUNTER — Other Ambulatory Visit: Payer: Self-pay | Admitting: Family Medicine

## 2020-09-15 ENCOUNTER — Telehealth: Payer: Self-pay

## 2020-09-15 ENCOUNTER — Other Ambulatory Visit: Payer: Self-pay

## 2020-09-15 MED ORDER — CLONIDINE HCL 0.3 MG PO TABS: ORAL_TABLET | ORAL | 1 refills | Status: DC

## 2020-09-15 MED ORDER — CLONIDINE HCL 0.3 MG PO TABS
ORAL_TABLET | ORAL | 0 refills | Status: DC
Start: 2020-09-15 — End: 2021-03-12

## 2020-09-15 NOTE — Telephone Encounter (Signed)
Please send a couple of days worth to get her thru until she receives the rx from [facility]  Please send to [facility] in [place]   Please call to advise [phone]

## 2020-09-15 NOTE — Telephone Encounter (Signed)
Temp supply sent

## 2020-09-16 ENCOUNTER — Other Ambulatory Visit: Payer: Self-pay

## 2020-09-16 ENCOUNTER — Ambulatory Visit (INDEPENDENT_AMBULATORY_CARE_PROVIDER_SITE_OTHER): Payer: Medicare HMO | Admitting: Physical Medicine and Rehabilitation

## 2020-09-16 ENCOUNTER — Encounter: Payer: Self-pay | Admitting: Physical Medicine and Rehabilitation

## 2020-09-16 ENCOUNTER — Ambulatory Visit: Payer: Self-pay

## 2020-09-16 VITALS — BP 152/80 | HR 67

## 2020-09-16 DIAGNOSIS — M25551 Pain in right hip: Secondary | ICD-10-CM

## 2020-09-16 DIAGNOSIS — M1611 Unilateral primary osteoarthritis, right hip: Secondary | ICD-10-CM

## 2020-09-16 DIAGNOSIS — M4316 Spondylolisthesis, lumbar region: Secondary | ICD-10-CM

## 2020-09-16 DIAGNOSIS — M48061 Spinal stenosis, lumbar region without neurogenic claudication: Secondary | ICD-10-CM

## 2020-09-16 NOTE — Progress Notes (Signed)
Right groin pain. Sometimes has difficulty walking. Numeric Pain Rating Scale and Functional Assessment Average Pain 9   In the last MONTH (on 0-10 scale) has pain interfered with the following?  1. General activity like being  able to carry out your everyday physical activities such as walking, climbing stairs, carrying groceries, or moving a chair?  Rating(8)    -Dye Allergies.

## 2020-09-16 NOTE — Progress Notes (Signed)
Mikayla Jenkins - 74 y.o. female MRN [id number]  Date of birth: 05/25/1946  Office Visit Note: Visit Date: 09/16/2020 PCP: Fayrene Helper, MD Referred by: Fayrene Helper, MD  Subjective: Chief Complaint  Patient presents with  . Right Hip - Pain   HPI: Mikayla Jenkins is a 74 y.o. female who comes in today For evaluation management of worsening severe right hip and groin pain at the request of her primary care physician Dr. Tula Nakayama.  I last saw the patient in 2018 and that note can be reviewed in the chart.  At the time she was having issues with her lower spine with a history of stenosis and epidural injections performed mainly at [facility] imaging.  At that time when we saw her she was having pretty classic symptoms of intra-articular hip pathology.  We did obtain x-rays at the time that showed severe osteoarthritis of the right hip.  Given that her symptoms are very consistent with that we did provide intra-articular anesthetic hip arthrogram using fluoroscopic guidance and she got excellent relief.  She reports good relief with that type of symptoms up until just the last several months.  She is here with her husband and son I believe.  He provides a lot of the history as well.  She endorses 9 out of 10 right hip and groin pain worse with going from sit to stand and with walking.  She has a lot of difficulty getting in and out of the car and going up and down steps.  She denies any posterior lateral pain or paresthesias in the right leg or left leg.  She denies any focal weakness.  Her case is complicated by history of type 2 diabetes with last hemoglobin A1c of 7.3 in October of this year..  She has not had any recent imaging of the hip.  By way of brief review looking through the chart and talking with the patient it appears that she has had several epidural injections the past year.  There were 2 right L4 transforaminal epidural steroid injections performed at the request of  Deatra Ina, MD.  He is a Publishing rights manager at [facility] [facility].  It looks like she also had updated MRI of the lumbar spine performed prior to this through her primary care physician.  I did review that today and is reviewed below.  Compared to when I saw her last she has worsening severe stenosis at L4-5.  She went on to have an interlaminar injection performed at this level at [place].  Currently right now she has no radicular complaints but pretty classic hip symptoms on the right.  She has had no recent falls or trauma.  Multiple questions were asked by her family and these were all answered today.  Review of Systems  Musculoskeletal: Positive for back pain and joint pain.  All other systems reviewed and are negative.  Otherwise per HPI.  Assessment & Plan: Visit Diagnoses:    ICD-10-CM   1. Pain in right hip  M25.551 XR C-ARM NO REPORT  2. Unilateral primary osteoarthritis, right hip  M16.11   3. Spinal stenosis of lumbar region without neurogenic claudication  M48.061   4. Spondylolisthesis of lumbar region  M43.16      Plan: Findings:  History of severe osteoarthritis of the right hip with good results from intra-articular injection performed in 2018 in our office.  She reports worsening symptoms over the last several months consistent with return  of this pain.  Plan today would be to repeat this injection diagnostically and hopefully therapeutically.  As of note after completing the injection she did have good relief during the anesthetic phase of the injection.  Should her symptoms return fairly quickly I would have her see Dr. Jean Rosenthal in the office for consideration of hip arthroplasty.  Discussed with the family and the patient today about hip arthroplasty.  Fluoroscopic survey of the hip shows again severe osteoarthritis with some worsening since last time I saw her but still severe.  Essentially end-stage osteoarthritis.  Exam is  interesting that she does get some hip movement with rotation although it does cause pain.  I do not feel there is any symptoms today related to her lumbar spine but clearly she has a lot of lumbar spine issues as well.  Those are currently being managed through her primary care physician and [facility]    Meds & Orders: No orders of the defined types were placed in this encounter.   Orders Placed This Encounter  Procedures  . Large Joint Inj: R hip joint  . XR C-ARM NO REPORT    Follow-up: Return if symptoms worsen or fail to improve.   Procedures: Large Joint Inj: R hip joint on 09/16/2020 11:39 AM Indications: diagnostic evaluation and pain Details: 22 G 3.5 in needle, fluoroscopy-guided anterior approach  Arthrogram: No  Medications: 4 mL bupivacaine 0.25 %; 60 mg triamcinolone acetonide 40 MG/ML Outcome: tolerated well, no immediate complications  There was excellent flow of contrast producing a partial arthrogram of the hip. The patient did have relief of symptoms during the anesthetic phase of the injection. Procedure, treatment alternatives, risks and benefits explained, specific risks discussed. Consent was given by the patient. Immediately prior to procedure a time out was called to verify the correct patient, procedure, equipment, support staff and site/side marked as required. Patient was prepped and draped in the usual sterile fashion.      No notes on file   Clinical History: MRI LUMBAR SPINE WITHOUT CONTRAST  TECHNIQUE: Multiplanar, multisequence MR imaging of the lumbar spine was performed. No intravenous contrast was administered.  COMPARISON:  MRI lumbar spine 05/15/2012.  FINDINGS: Segmentation: The patient's rudimentary disc material at S1-2. Numbering scheme is same as that used on the prior MRI.  Alignment: Convex left scoliosis is again seen. Facet mediated 0.7 cm anterolisthesis L4 on L5 has increased from 0.4 cm  on the prior MRI. 0.5 cm anterolisthesis L5 on S1 is unchanged.  Vertebrae:  No fracture or worrisome lesion.  Conus medullaris and cauda equina: Conus extends to the L1 level. Conus and cauda equina appear normal.  Paraspinal and other soft tissues: Negative.  Disc levels:  T11-12 is imaged in the sagittal plane only and negative.  T12-L1: Tiny right paracentral protrusion without stenosis.  L1-2: Negative.  L2-3: Negative.  L3-4: Moderate bilateral facet degenerative disease and a shallow disc bulge. No stenosis.  L4-5: Bilateral facet degenerative change is worse on the right. The disc is uncovered with a shallow bulge. Broad-based right paracentral protrusion seen on the prior examination has regressed. Moderate to moderately severe central canal stenosis is present and there is narrowing in the right subarticular recess. Anterolisthesis and disc cause moderately severe right foraminal narrowing. The left foramen is open. Right foraminal narrowing has worsened since the prior examination but right lateral recess stenosis has improved.  L5-S1: The facet joints are ankylosed. Marked loss of disc space height  is present but there is no bulge or protrusion. The central canal and foramina are open.  IMPRESSION: 0.7 cm facet mediated anterolisthesis L4 on L5 is increased from 0.4 cm on the prior examination. There is moderate to moderately severe central canal stenosis and moderately severe right foraminal narrowing at this level. Right foraminal narrowing appears worse than on the prior examination. Narrowing in the right subarticular recess is improved due to regression of a right paracentral protrusion seen on the prior MRI.  Convex left scoliosis.   Electronically Signed   By: Inge Rise M.D.   On: 08/23/2018 11:40   She reports that she has never smoked. She has never used smokeless tobacco.  Recent Labs    01/04/20 1224 03/28/20 1352  08/13/20 1316  HGBA1C 7.5* 7.0* 7.3*    Objective:  VS:  HT:    WT:   BMI:     BP:(!) 152/80  HR:67bpm  TEMP: ( )  RESP:  Physical Exam Constitutional:      General: She is not in acute distress.    Appearance: Normal appearance. She is not ill-appearing.  HENT:     Head: Normocephalic and atraumatic.     Right Ear: External ear normal.     Left Ear: External ear normal.  Eyes:     Extraocular Movements: Extraocular movements intact.  Cardiovascular:     Rate and Rhythm: Normal rate.     Pulses: Normal pulses.  Musculoskeletal:     Right lower leg: No edema.     Left lower leg: No edema.     Comments: Patient has good distal strength with no pain over the greater trochanters.  No clonus or focal weakness.  She has concordant hip pain with internal rotation of the right hip.  She actually has decent range of motion on the right.  No pain on the left.  Skin:    Findings: No erythema, lesion or rash.  Neurological:     General: No focal deficit present.     Mental Status: She is alert and oriented to person, place, and time.     Sensory: No sensory deficit.     Motor: No weakness or abnormal muscle tone.     Coordination: Coordination normal.  Psychiatric:        Mood and Affect: Mood normal.        Behavior: Behavior normal.     Ortho Exam  Imaging: XR C-ARM NO REPORT  Result Date: 09/16/2020 Please see Notes tab for imaging impression.   Past Medical/Family/Surgical/Social History: Medications & Allergies reviewed per EMR, new medications updated. Patient Active Problem List   Diagnosis Date Noted  . Osteoarthritis of right hip 08/18/2020  . Anemia in chronic kidney disease 10/09/2019  . Stage 3b chronic kidney disease ([place]) 10/03/2019  . Anemia 07/01/2017  . Female bladder prolapse 06/30/2015  . Annual physical exam 05/12/2014  . Lipoma of abdominal wall 01/24/2013  . Pain in back 05/15/2012  . Low back pain with right-sided sciatica 04/26/2012  .  ALLERGIC RHINITIS, SEASONAL 04/13/2009  . Type 2 diabetes mellitus with nephropathy ([place]) 01/10/2008  . Hyperlipidemia LDL goal <100 01/10/2008  . Malignant hypertension 01/10/2008   Past Medical History:  Diagnosis Date  . Diabetes mellitus type II   . Hyperlipidemia   . Hypertension   . Renal disorder    Family History  Problem Relation Age of Onset  . Pneumonia Brother        on continuous O2  .  Stroke Brother   . Stroke Sister   . COPD Sister   . Diabetes Sister   . Stroke Sister   . Hypertension Mother   . Diabetes Mother   . Fibroids Daughter        uterine   Past Surgical History:  Procedure Laterality Date  . ABDOMINAL HYSTERECTOMY    . COLONOSCOPY N/A 06/13/2015   Procedure: COLONOSCOPY;  Surgeon: Rogene Houston, MD;  Location: [facility];  Service: Endoscopy;  Laterality: N/A;  930-rescheduled 8/26 @ 8:30am Ann to notify pt  . HERNIA REPAIR    . inguinal herniorrhapy right    . right eye surgery secondary to right eye weakness    . SALPINGOOPHORECTOMY    . TUBAL LIGATION     Social History   Occupational History  . Not on file  Tobacco Use  . Smoking status: Never Smoker  . Smokeless tobacco: Never Used  Vaping Use  . Vaping Use: Never used  Substance and Sexual Activity  . Alcohol use: No  . Drug use: No  . Sexual activity: Yes    Birth control/protection: Surgical

## 2020-09-17 ENCOUNTER — Encounter: Payer: Self-pay | Admitting: Physical Medicine and Rehabilitation

## 2020-09-17 MED ORDER — TRIAMCINOLONE ACETONIDE 40 MG/ML IJ SUSP
60.0000 mg | INTRAMUSCULAR | Status: AC | PRN
  Administered 2020-09-16: 60 mg via INTRA_ARTICULAR

## 2020-09-17 MED ORDER — BUPIVACAINE HCL 0.25 % IJ SOLN
4.0000 mL | INTRAMUSCULAR | Status: AC | PRN
  Administered 2020-09-16: 4 mL via INTRA_ARTICULAR

## 2020-09-29 ENCOUNTER — Ambulatory Visit (HOSPITAL_COMMUNITY)
Admission: RE | Admit: 2020-09-29 | Discharge: 2020-09-29 | Disposition: A | Discharge status: Home / Self Care | Payer: Medicare HMO | Source: Ambulatory Visit | Attending: Family Medicine | Admitting: Family Medicine

## 2020-09-29 ENCOUNTER — Other Ambulatory Visit: Payer: Self-pay

## 2020-09-29 DIAGNOSIS — Z1231 Encounter for screening mammogram for malignant neoplasm of breast: Secondary | ICD-10-CM | POA: Diagnosis not present

## 2020-10-01 ENCOUNTER — Telehealth: Payer: Self-pay | Admitting: Physical Medicine and Rehabilitation

## 2020-10-01 NOTE — Telephone Encounter (Signed)
Pt daughter called and the injections did not work for her pain and she really need to come back in and be seen by Dr.Newton.

## 2020-10-02 ENCOUNTER — Other Ambulatory Visit (HOSPITAL_COMMUNITY): Payer: Self-pay | Admitting: Family Medicine

## 2020-10-02 DIAGNOSIS — R928 Other abnormal and inconclusive findings on diagnostic imaging of breast: Secondary | ICD-10-CM

## 2020-10-02 NOTE — Telephone Encounter (Signed)
Daughter states that the injection gave only minimal relief for a few days. Patient does not have an orthopedic surgeon that she sees. Scheduled for OV with Dr. Ninfa Linden.

## 2020-10-07 ENCOUNTER — Ambulatory Visit (HOSPITAL_COMMUNITY)
Admission: RE | Admit: 2020-10-07 | Discharge: 2020-10-07 | Disposition: A | Discharge status: Home / Self Care | Payer: Medicare HMO | Source: Ambulatory Visit | Attending: Family Medicine | Admitting: Family Medicine

## 2020-10-07 ENCOUNTER — Other Ambulatory Visit: Payer: Self-pay

## 2020-10-07 DIAGNOSIS — R928 Other abnormal and inconclusive findings on diagnostic imaging of breast: Secondary | ICD-10-CM

## 2020-10-07 DIAGNOSIS — R922 Inconclusive mammogram: Secondary | ICD-10-CM | POA: Diagnosis not present

## 2020-10-15 ENCOUNTER — Ambulatory Visit (INDEPENDENT_AMBULATORY_CARE_PROVIDER_SITE_OTHER): Payer: Medicare HMO

## 2020-10-15 ENCOUNTER — Ambulatory Visit: Payer: Medicare HMO | Admitting: Orthopaedic Surgery

## 2020-10-15 ENCOUNTER — Encounter: Payer: Self-pay | Admitting: Orthopaedic Surgery

## 2020-10-15 DIAGNOSIS — M25551 Pain in right hip: Secondary | ICD-10-CM

## 2020-10-15 DIAGNOSIS — M1611 Unilateral primary osteoarthritis, right hip: Secondary | ICD-10-CM | POA: Diagnosis not present

## 2020-10-15 NOTE — Progress Notes (Signed)
Office Visit Note   Patient: Mikayla Jenkins           Date of Birth: June 03, 1946           MRN: [id number] Visit Date: 10/15/2020              Requested by: Fayrene Helper, MD [address] [place],  [place] [address] PCP: Fayrene Helper, MD   Assessment & Plan: Visit Diagnoses:  1. Pain in right hip   2. Unilateral primary osteoarthritis, right hip     Plan: I had a long and thorough discussion with the patient and her family about hip replacement surgery.  I went over the x-rays in detail.  I showed her a hip model and explained what the surgery involves with replacing a hip.  I described the risk and benefits of surgery in detail.  We talked about her interoperative and postoperative course.  Given her clinical exam findings combined with x-ray findings and the fair conservative treatment, we are recommending a right total hip arthroplasty through direct anterior approach.  They are certainly interested in having this scheduled.  I will fill out a surgery schedule sheet and we will work on trying to get this scheduled in the near future.  All questions and concerns were answered and addressed.  Follow-Up Instructions: Return for 2 weeks post-op.   Orders:  Orders Placed This Encounter  Procedures  . XR HIP UNILAT W OR W/O PELVIS 2-3 VIEWS RIGHT   No orders of the defined types were placed in this encounter.     Procedures: No procedures performed   Clinical Data: No additional findings.   Subjective: Chief Complaint  Patient presents with  . Right Hip - Pain  The patient comes in today with worsening right hip pain.  She has known severe end-stage arthritis of her right hip with also hip protrusio.  Her family is with her today.  She is active at 74 years old but she is becoming less active due to the severity of her right hip pain.  It hurts in the groin.  It is a daily pain that is gotten to be 10 out of 10.  It is now detrimentally affecting her  mobility, her quality of life and her actives daily living.  She is interested in hip replacement surgery at this point.  She has had intra-articular injections in this right hip that helped minimally.  They unfortunately did increase her blood glucose.  Her last hemoglobin A1c was up to 7.3.  She is working on better blood glucose control.  At this point she has tried and failed conservative treatment for over 12 months now.  She is worked on activity modification.  She is not obese.  Her family is also concerned about her significant decrease in mobility due to the severity of her right hip pain.  She has chronic renal insufficiency issues she cannot take NSAIDs.  HPI  Review of Systems She currently denies any headache, chest pain, shortness of breath, fever, chills, nausea, vomiting  Objective: Vital Signs: There were no vitals taken for this visit.  Physical Exam She is alert and orient x3 and in no acute distress. Ortho Exam Examination of her left hip is normal with fluid range of motion and no pain in the groin.  Examination of her right hip shows severe stiffness with internal and external rotation and significant pain with attempted rotation. Specialty Comments:  No specialty comments available.  Imaging: No results found. A recent [facility] pelvis and lateral of the right hip shows significant protrusio of the right hip.  There is also significant arthritic changes with particular osteophytes and joint space narrowing.  The left hip appears normal.  PMFS History: Patient Active Problem List   Diagnosis Date Noted  . Osteoarthritis of right hip 08/18/2020  . Anemia in chronic kidney disease 10/09/2019  . Stage 3b chronic kidney disease ([place]) 10/03/2019  . Anemia 07/01/2017  . Female bladder prolapse 06/30/2015  . Annual physical exam 05/12/2014  . Lipoma of abdominal wall 01/24/2013  . Pain in back 05/15/2012  . Low back pain with right-sided sciatica 04/26/2012  . ALLERGIC  RHINITIS, SEASONAL 04/13/2009  . Type 2 diabetes mellitus with nephropathy ([place]) 01/10/2008  . Hyperlipidemia LDL goal <100 01/10/2008  . Malignant hypertension 01/10/2008   Past Medical History:  Diagnosis Date  . Diabetes mellitus type II   . Hyperlipidemia   . Hypertension   . Renal disorder     Family History  Problem Relation Age of Onset  . Pneumonia Brother        on continuous O2  . Stroke Brother   . Stroke Sister   . COPD Sister   . Diabetes Sister   . Stroke Sister   . Hypertension Mother   . Diabetes Mother   . Fibroids Daughter        uterine    Past Surgical History:  Procedure Laterality Date  . ABDOMINAL HYSTERECTOMY    . COLONOSCOPY N/A 06/13/2015   Procedure: COLONOSCOPY;  Surgeon: Rogene Houston, MD;  Location[facility];  Service: Endoscopy;  Laterality: N/A;  930-rescheduled 8/26 @ 8:30am Ann to notify pt  . HERNIA REPAIR    . inguinal herniorrhapy right    . right eye surgery secondary to right eye weakness    . SALPINGOOPHORECTOMY    . TUBAL LIGATION     Social History   Occupational History  . Not on file  Tobacco Use  . Smoking status: Never Smoker  . Smokeless tobacco: Never Used  Vaping Use  . Vaping Use: Never used  Substance and Sexual Activity  . Alcohol use: No  . Drug use: No  . Sexual activity: Yes    Birth control/protection: Surgical

## 2020-10-28 ENCOUNTER — Other Ambulatory Visit (HOSPITAL_COMMUNITY): Payer: Medicare HMO

## 2020-10-28 ENCOUNTER — Encounter (HOSPITAL_COMMUNITY): Payer: Medicare HMO

## 2020-10-29 DIAGNOSIS — N189 Chronic kidney disease, unspecified: Secondary | ICD-10-CM | POA: Diagnosis not present

## 2020-10-29 DIAGNOSIS — I129 Hypertensive chronic kidney disease with stage 1 through stage 4 chronic kidney disease, or unspecified chronic kidney disease: Secondary | ICD-10-CM | POA: Diagnosis not present

## 2020-10-29 DIAGNOSIS — D638 Anemia in other chronic diseases classified elsewhere: Secondary | ICD-10-CM | POA: Diagnosis not present

## 2020-10-29 DIAGNOSIS — I5032 Chronic diastolic (congestive) heart failure: Secondary | ICD-10-CM | POA: Diagnosis not present

## 2020-10-29 DIAGNOSIS — E1122 Type 2 diabetes mellitus with diabetic chronic kidney disease: Secondary | ICD-10-CM | POA: Diagnosis not present

## 2020-11-05 DIAGNOSIS — D638 Anemia in other chronic diseases classified elsewhere: Secondary | ICD-10-CM | POA: Diagnosis not present

## 2020-11-05 DIAGNOSIS — I5032 Chronic diastolic (congestive) heart failure: Secondary | ICD-10-CM | POA: Diagnosis not present

## 2020-11-05 DIAGNOSIS — N189 Chronic kidney disease, unspecified: Secondary | ICD-10-CM | POA: Diagnosis not present

## 2020-11-05 DIAGNOSIS — E1122 Type 2 diabetes mellitus with diabetic chronic kidney disease: Secondary | ICD-10-CM | POA: Diagnosis not present

## 2020-11-05 DIAGNOSIS — I129 Hypertensive chronic kidney disease with stage 1 through stage 4 chronic kidney disease, or unspecified chronic kidney disease: Secondary | ICD-10-CM | POA: Diagnosis not present

## 2020-11-11 ENCOUNTER — Other Ambulatory Visit: Payer: Self-pay | Admitting: Family Medicine

## 2020-12-09 ENCOUNTER — Other Ambulatory Visit: Payer: Self-pay

## 2020-12-10 ENCOUNTER — Other Ambulatory Visit: Payer: Self-pay | Admitting: Family Medicine

## 2020-12-10 DIAGNOSIS — E785 Hyperlipidemia, unspecified: Secondary | ICD-10-CM

## 2020-12-10 DIAGNOSIS — I10 Essential (primary) hypertension: Secondary | ICD-10-CM

## 2020-12-10 DIAGNOSIS — E1121 Type 2 diabetes mellitus with diabetic nephropathy: Secondary | ICD-10-CM

## 2020-12-17 ENCOUNTER — Telehealth: Payer: Self-pay

## 2020-12-17 ENCOUNTER — Other Ambulatory Visit: Payer: Self-pay

## 2020-12-17 MED ORDER — BLOOD GLUCOSE METER KIT: PACK | 0 refills | Status: DC

## 2020-12-17 NOTE — Telephone Encounter (Signed)
Alec called from [employer]. Patient requested new monitor Accu-chek guide to be sent in they are discontinuing the [employer]iva monitor and will need new test strips as well to go along with the new accu-check guide.

## 2020-12-17 NOTE — Telephone Encounter (Signed)
Rx sent to [facility]

## 2020-12-19 ENCOUNTER — Other Ambulatory Visit: Payer: Self-pay | Admitting: Physician Assistant

## 2020-12-25 DIAGNOSIS — H5200 Hypermetropia, unspecified eye: Secondary | ICD-10-CM | POA: Diagnosis not present

## 2020-12-25 DIAGNOSIS — Z0100 Encounter for examination of eyes and vision without abnormal findings: Secondary | ICD-10-CM | POA: Diagnosis not present

## 2020-12-25 DIAGNOSIS — E559 Vitamin D deficiency, unspecified: Secondary | ICD-10-CM | POA: Diagnosis not present

## 2020-12-25 DIAGNOSIS — E1121 Type 2 diabetes mellitus with diabetic nephropathy: Secondary | ICD-10-CM | POA: Diagnosis not present

## 2020-12-25 DIAGNOSIS — E785 Hyperlipidemia, unspecified: Secondary | ICD-10-CM | POA: Diagnosis not present

## 2020-12-25 DIAGNOSIS — I10 Essential (primary) hypertension: Secondary | ICD-10-CM | POA: Diagnosis not present

## 2020-12-25 LAB — HM DIABETES EYE EXAM

## 2020-12-27 LAB — TSH: TSH: 1.19 u[IU]/mL (ref 0.450–4.500)

## 2020-12-27 LAB — CBC
Hematocrit: 36.9 % (ref 34.0–46.6)
Hemoglobin: 12.5 g/dL (ref 11.1–15.9)
MCH: 31.5 pg (ref 26.6–33.0)
MCHC: 33.9 g/dL (ref 31.5–35.7)
MCV: 93 fL (ref 79–97)
Platelets: 229 10*3/uL (ref 150–450)
RBC: 3.97 x10E6/uL (ref 3.77–5.28)
RDW: 11.8 % (ref 11.7–15.4)
WBC: 4.8 10*3/uL (ref 3.4–10.8)

## 2020-12-27 LAB — CMP14+EGFR
ALT: 23 IU/L (ref 0–32)
AST: 28 IU/L (ref 0–40)
Albumin/Globulin Ratio: 2 (ref 1.2–2.2)
Albumin: 5.1 g/dL — ABNORMAL HIGH (ref 3.7–4.7)
Alkaline Phosphatase: 110 IU/L (ref 44–121)
BUN/Creatinine Ratio: 13 (ref 12–28)
BUN: 16 mg/dL (ref 8–27)
Bilirubin Total: 0.8 mg/dL (ref 0.0–1.2)
CO2: 22 mmol/L (ref 20–29)
Calcium: 10.6 mg/dL — ABNORMAL HIGH (ref 8.7–10.3)
Chloride: 98 mmol/L (ref 96–106)
Creatinine, Ser: 1.28 mg/dL — ABNORMAL HIGH (ref 0.57–1.00)
Globulin, Total: 2.5 g/dL (ref 1.5–4.5)
Glucose: 159 mg/dL — ABNORMAL HIGH (ref 65–99)
Potassium: 4.5 mmol/L (ref 3.5–5.2)
Sodium: 138 mmol/L (ref 134–144)
Total Protein: 7.6 g/dL (ref 6.0–8.5)
eGFR: 44 mL/min/{1.73_m2} — ABNORMAL LOW (ref >59)

## 2020-12-27 LAB — LIPID PANEL
Chol/HDL Ratio: 1.9 ratio (ref 0.0–4.4)
Cholesterol, Total: 212 mg/dL — ABNORMAL HIGH (ref 100–199)
HDL: 109 mg/dL (ref >39)
LDL Chol Calc (NIH): 88 mg/dL (ref 0–99)
Triglycerides: 88 mg/dL (ref 0–149)
VLDL Cholesterol Cal: 15 mg/dL (ref 5–40)

## 2020-12-27 LAB — VITAMIN D 25 HYDROXY (VIT D DEFICIENCY, FRACTURES): Vit D, 25-Hydroxy: 55.6 ng/mL (ref 30.0–100.0)

## 2020-12-29 ENCOUNTER — Encounter: Payer: Self-pay | Admitting: Family Medicine

## 2020-12-29 ENCOUNTER — Other Ambulatory Visit: Payer: Self-pay

## 2020-12-29 ENCOUNTER — Telehealth (INDEPENDENT_AMBULATORY_CARE_PROVIDER_SITE_OTHER): Payer: Medicare HMO | Admitting: Family Medicine

## 2020-12-29 VITALS — BP 140/80 | Ht 67.0 in | Wt 150.0 lb

## 2020-12-29 DIAGNOSIS — E1121 Type 2 diabetes mellitus with diabetic nephropathy: Secondary | ICD-10-CM | POA: Diagnosis not present

## 2020-12-29 DIAGNOSIS — E785 Hyperlipidemia, unspecified: Secondary | ICD-10-CM | POA: Diagnosis not present

## 2020-12-29 DIAGNOSIS — M1611 Unilateral primary osteoarthritis, right hip: Secondary | ICD-10-CM | POA: Diagnosis not present

## 2020-12-29 DIAGNOSIS — I10 Essential (primary) hypertension: Secondary | ICD-10-CM

## 2020-12-29 DIAGNOSIS — N1832 Chronic kidney disease, stage 3b: Secondary | ICD-10-CM | POA: Diagnosis not present

## 2020-12-29 LAB — HM DIABETES EYE EXAM

## 2020-12-29 MED ORDER — HYDROCODONE-ACETAMINOPHEN 5-325 MG PO TABS: ORAL_TABLET | ORAL | 0 refills | Status: DC

## 2020-12-29 NOTE — Patient Instructions (Addendum)
Annual physical exam in office with Dr Moshe Cipro early July, call if you need me sooner  Non fasting HBA1C, chem 7 and EGFR 3 to 5 days before visit  You will be contacted as  soon as it is available, no later tha tomorrow with your HBA1C result  Handicap sticker will be prepared for you to collect later this week,w e will call you when it is ready.  We will get your recent eye exam from Dr Jorja Loa  For your record, done last week  Please  DO get your covid booster, you NEED this  All the best with upcoming right hip replacement , this WILL improve the quality of your life  Short course of pain medication is prescribed to last until you get your surgery, then Orthopedics will be responsible until they release you post operatively  Thanks for choosing [facility], we consider it a privelige to serve you.

## 2020-12-29 NOTE — Progress Notes (Signed)
Virtual Visit via Telephone Note  I connected with Mikayla Jenkins on 12/29/20 at 10:20 AM EDT by telephone and verified that I am speaking with the correct person using two identifiers.  Location: Patient: home Provider: office   I discussed the limitations, risks, security and privacy concerns of performing an evaluation and management service by telephone and the availability of in person appointments. I also discussed with the patient that there may be a patient responsible charge related to this service. The patient expressed understanding and agreed to proceed.   History of Present Illness:  Denies recent fever or chills. Denies sinus pressure, nasal congestion, ear pain or sore throat. Denies chest congestion, productive cough or wheezing. Denies chest pains, palpitations and leg swelling Denies abdominal pain, nausea, vomiting,diarrhea or constipation.   Denies dysuria, frequency, hesitancy or incontinence. Severe right hip pain and reduced mobility, has upcoming surgery, awaiting medical clearance/ assessment of diabetic status reportedly planned Denies headaches, seizures, . Denies depression, anxiety or insomnia. Denies skin break down or rash.     Observations/Objective: BP 140/80   Ht 5\' 7"  (1.702 m)   Wt 150 lb (68 kg)   BMI 23.49 kg/m  Good communication with no confusion and intact memory. Alert and oriented x 3 No signs of respiratory distress during speech    Assessment and Plan: Malignant hypertension DASH diet and commitment to daily physical activity for a minimum of 30 minutes discussed and encouraged, as a part of hypertension management. The importance of attaining a healthy weight is also discussed.  BP/Weight 01/01/2021 12/29/2020 09/16/2020 08/18/2020 05/22/2020 04/16/2020 2/63/7858  Systolic BP 850 277 412 878 [phone]  Diastolic BP 74 80 80 76 74 74 78  Wt. (Lbs) 155 150 - 162 161 - 161.8  BMI 24.28 23.49 - 25 24.84 - 24.97       Type 2  diabetes mellitus with nephropathy (HCC) Controlled, no change in medication Mikayla Jenkins is reminded of the importance of commitment to daily physical activity for 30 minutes or more, as able and the need to limit carbohydrate intake to 30 to 60 grams per meal to help with blood sugar control.   The need to take medication as prescribed, test blood sugar as directed, and to call between visits if there is a concern that blood sugar is uncontrolled is also discussed.   Mikayla Jenkins is reminded of the importance of daily foot exam, annual eye examination, and good blood sugar, blood pressure and cholesterol control.  Diabetic Labs Latest Ref Rng & Units 12/25/2020 08/13/2020 03/28/2020 01/04/2020 11/16/2019  HbA1c 4.8 - 5.6 % 7.4(H) 7.3(H) 7.0(H) 7.5(H) -  Microalbumin mg/dL - 0.6 - - -  Micro/Creat Ratio <30 mcg/mg creat - - - - -  Chol 100 - 199 mg/dL 212(H) 209(H) - 205(H) -  HDL >39 mg/dL 109 100 - 94 -  Calc LDL 0 - 99 mg/dL 88 89 - 96 -  Triglycerides 0 - 149 mg/dL 88 107 - 66 -  Creatinine 0.57 - 1.00 mg/dL 1.28(H) 1.38(H) 1.75(H) 1.51(H) 1.49(H)   BP/Weight 01/01/2021 12/29/2020 09/16/2020 08/18/2020 05/22/2020 04/16/2020 0/96/2836  Systolic BP 629 476 546 503 546 568 127  Diastolic BP 74 80 80 76 74 74 78  Wt. (Lbs) 155 150 - 162 161 - 161.8  BMI 24.28 23.49 - 25 24.84 - 24.97   Foot/eye exam completion dates Latest Ref Rng & Units 04/07/2020 12/26/2019  Eye Exam No Retinopathy - No Retinopathy  Foot Form Completion -  Done -        Hyperlipidemia LDL goal <100 Hyperlipidemia:Low fat diet discussed and encouraged.   Lipid Panel  Lab Results  Component Value Date   CHOL 212 (H) 12/25/2020   HDL 109 12/25/2020   LDLCALC 88 12/25/2020   TRIG 88 12/25/2020   CHOLHDL 1.9 12/25/2020     Needs to reduce fat intake, no med change  Osteoarthritis of right hip Replacement is planned in the near future. Handicap sticker to be provided Limited supply of hydrocodone prescribed until Ortho  takes over    Follow Up Instructions:    I discussed the assessment and treatment plan with the patient. The patient was provided an opportunity to ask questions and all were answered. The patient agreed with the plan and demonstrated an understanding of the instructions.   The patient was advised to call back or seek an in-person evaluation if the symptoms worsen or if the condition fails to improve as anticipated.  I provided 16 minutes of non-face-to-face time during this encounter.   Tula Nakayama, MD

## 2020-12-30 LAB — SPECIMEN STATUS REPORT

## 2020-12-30 LAB — HGB A1C W/O EAG: Hgb A1c MFr Bld: 7.4 % — ABNORMAL HIGH (ref 4.8–5.6)

## 2021-01-01 ENCOUNTER — Ambulatory Visit (INDEPENDENT_AMBULATORY_CARE_PROVIDER_SITE_OTHER): Payer: Medicare HMO | Admitting: Student

## 2021-01-01 ENCOUNTER — Other Ambulatory Visit: Payer: Self-pay

## 2021-01-01 ENCOUNTER — Encounter: Payer: Self-pay | Admitting: Student

## 2021-01-01 VITALS — BP 138/74 | HR 63 | Ht 67.0 in | Wt 155.0 lb

## 2021-01-01 DIAGNOSIS — E785 Hyperlipidemia, unspecified: Secondary | ICD-10-CM | POA: Diagnosis not present

## 2021-01-01 DIAGNOSIS — Z01810 Encounter for preprocedural cardiovascular examination: Secondary | ICD-10-CM

## 2021-01-01 DIAGNOSIS — I10 Essential (primary) hypertension: Secondary | ICD-10-CM

## 2021-01-01 NOTE — Patient Instructions (Signed)
Medication Instructions:  Your physician recommends that you continue on your current medications as directed. Please refer to the Current Medication list given to you today.  *If you need a refill on your cardiac medications before your next appointment, please call your pharmacy*   Lab Work: None today If you have labs (blood work) drawn today and your tests are completely normal, you will receive your results only by: Marland Kitchen MyChart Message (if you have MyChart) OR . A paper copy in the mail If you have any lab test that is abnormal or we need to change your treatment, we will call you to review the results.   Testing/Procedures: None today   Follow-Up: At [facility], you and your health needs are our priority.  As part of our continuing mission to provide you with exceptional heart care, we have created designated Provider Care Teams.  These Care Teams include your primary Cardiologist (physician) and Advanced Practice Providers (APPs -  Physician Assistants and Nurse Practitioners) who all work together to provide you with the care you need, when you need it.  We recommend signing up for the patient portal called "MyChart".  Sign up information is provided on this After Visit Summary.  MyChart is used to connect with patients for Virtual Visits (Telemedicine).  Patients are able to view lab/test results, encounter notes, upcoming appointments, etc.  Non-urgent messages can be sent to your provider as well.   To learn more about what you can do with MyChart, go to [email or web address].    Your next appointment:   12 month(s)  The format for your next appointment:   In Person  Provider:   You may see a cardiologist or one of the following Advanced Practice Providers on your designated Care Team:    Bernerd Pho, PA-C   Ermalinda Barrios, PA-C     Other Instructions None     Thank you for choosing [facility] !

## 2021-01-01 NOTE — Progress Notes (Signed)
Cardiology Office Note    Date:  01/01/2021   ID:  Mikayla Jenkins [birth year], MRN [id number]  PCP:  Fayrene Helper, MD  Cardiologist: Kate Sable, MD (Inactive)  --> Needs to establish with new MD  Chief Complaint  Patient presents with  . Follow-up    Annual Visit    History of Present Illness:    Mikayla Jenkins is a 75 y.o. female with past medical history of HTN, HLD and Type 2 DM who presents to the office today for annual follow-up.   She most recently had a telehealth visit with Dr. Bronson Ing in 12/2019 and had recently been evaluated in the ED for atypical chest pain and ruled out for ACS. She denied any recurrent symptoms at that time of her visit. BP was well-controlled and she was continued on her current medication regimen including ASA 14m daily, Amlodipine 184mdaily, Atenolol-Chlorthalidone 100-2528maily, Benazepril 57m38mily Clonidine 0.3mg 93mly, Lovastatin 57mg 81my and Spironolactone 12.5mg ev53m other day.    In talking with the patient and her spouse today, she reports doing well from a cardiac perspective since her last visit. She denies any recent chest pain or dyspnea on exertion. No recent orthopnea, PND, lower extremity edema or palpitations. [facility] but this has been limited secondary to hip pain. She is scheduled for right total hip arthroplasty next week.   Past Medical History:  Diagnosis Date  . Diabetes mellitus type II   . Hyperlipidemia   . Hypertension   . Renal disorder     Past Surgical History:  Procedure Laterality Date  . ABDOMINAL HYSTERECTOMY    . COLONOSCOPY N/A 06/13/2015   Procedure: COLONOSCOPY;  Surgeon: Najeeb Rogene [place];  Service: Endoscopy;  Laterality: N/A;  930-rescheduled 8/26 @ 8:30am Ann to notify pt  . HERNIA REPAIR    . inguinal herniorrhapy right    . right eye surgery secondary to right eye weakness    .  SALPINGOOPHORECTOMY    . TUBAL LIGATION      Current Medications: Outpatient Medications Prior to Visit  Medication Sig Dispense Refill  . acetaminophen (TYLENOL) 650 MG CR tablet Take 650 mg by mouth every 8 (eight) hours as needed for pain.    . amLODMarland Kitchenpine (NORVASC) 10 MG tablet Take 1 tablet (10 mg total) by mouth daily. 90 tablet 3  . aspirin 81 MG EC tablet Take 81 mg by mouth daily. Take 1 tablet by mouth once a day    . atenolol-chlorthalidone (TENORETIC) 100-25 MG tablet TAKE 1 TABLET EVERY DAY (Patient taking differently: Take 1 tablet by mouth daily.) 90 tablet 1  . benazepril (LOTENSIN) 40 MG tablet TAKE 1 TABLET EVERY DAY (Patient taking differently: Take 40 mg by mouth daily.) 90 tablet 1  . blood glucose meter kit and supplies Dispense based on patient and insurance preference. Use up to four times daily as directed. (FOR ICD-10 E10.9, E11.9). 1 each 0  . blood glucose meter kit and supplies Dispense based on patient and insurance preference  Accuchek guide meter and supplies once daily testing dx e11.9 1 each 0  . Calcium Carbonate-Vitamin D 600-400 MG-UNIT tablet Take 1 tablet by mouth daily.    . cloNIDine (CATAPRES) 0.3 MG tablet Take 1 and 1/2 tablets by mouth at bedtime (Patient taking differently: Take 0.45 mg by mouth at bedtime. Take 1 and 1/2 tablets  by mouth at bedtime) 12 tablet 0  . glipiZIDE (GLUCOTROL XL) 2.5 MG 24 hr tablet TAKE 1 TABLET WITH BREAKFAST EVERY TUESDAY, THURSDAY AND SATURDAY (Patient taking differently: Take 2.5 mg by mouth See admin instructions. TAKE 1 TABLET WITH BREAKFAST EVERY TUESDAY, THURSDAY AND SATURDAY) 36 tablet 1  . HYDROcodone-acetaminophen (NORCO/VICODIN) 5-325 MG tablet Take one half  to  one tablet by mouth once daily, as needed, for  Uncontrolled right  hip pain 10 tablet 0  . lovastatin (MEVACOR) 40 MG tablet TAKE 1 TABLET EVERY DAY (Patient taking differently: Take 40 mg by mouth daily.) 90 tablet 1  . Multiple Vitamin (MULTIVITAMIN)  tablet Take 1 tablet by mouth daily.    Marland Kitchen spironolactone (ALDACTONE) 25 MG tablet Take half tablet by mouth every other day (Patient taking differently: Take 12.5 mg by mouth every other day. Take half tablet by mouth every other day) 50 tablet 3   No facility-administered medications prior to visit.     Allergies:   Metformin and related   Social History   Socioeconomic History  . Marital status: Married    Spouse name: Not on file  . Number of children: Not on file  . Years of education: Not on file  . Highest education level: High school graduate  Occupational History  . Not on file  Tobacco Use  . Smoking status: Never Smoker  . Smokeless tobacco: Never Used  Vaping Use  . Vaping Use: Never used  Substance and Sexual Activity  . Alcohol use: No  . Drug use: No  . Sexual activity: Yes    Birth control/protection: Surgical  Other Topics Concern  . Not on file  Social History Narrative  . Not on file   Social Determinants of Health   Financial Resource Strain: Low Risk   . Difficulty of Paying Living Expenses: Not hard at all  Food Insecurity: No Food Insecurity  . Worried About Charity fundraiser in the Last Year: Never true  . Ran Out of Food in the Last Year: Never true  Transportation Needs: No Transportation Needs  . Lack of Transportation (Medical): No  . Lack of Transportation (Non-Medical): No  Physical Activity: Insufficiently Active  . Days of Exercise per Week: 3 days  . Minutes of Exercise per Session: 20 min  Stress: No Stress Concern Present  . Feeling of Stress : Not at all  Social Connections: Moderately Integrated  . Frequency of Communication with Friends and Family: More than three times a week  . Frequency of Social Gatherings with Friends and Family: More than three times a week  . Attends Religious Services: More than 4 times per year  . Active Member of Clubs or Organizations: No  . Attends Archivist Meetings: Never  . Marital  Status: Married     Family History:  The patient's family history includes COPD in her sister; Diabetes in her mother and sister; Fibroids in her daughter; Hypertension in her mother; Pneumonia in her brother; Stroke in her brother, sister, and sister.   Review of Systems:   Please see the history of present illness.     General:  No chills, fever, night sweats or weight changes. Positive for hip pain.  Cardiovascular:  No chest pain, dyspnea on exertion, edema, orthopnea, palpitations, paroxysmal nocturnal dyspnea. Dermatological: No rash, lesions/masses Respiratory: No cough, dyspnea Urologic: No hematuria, dysuria Abdominal:   No nausea, vomiting, diarrhea, bright red blood per rectum, melena, or hematemesis Neurologic:  No  visual changes, wkns, changes in mental status. All other systems reviewed and are otherwise negative except as noted above.   Physical Exam:    VS:  BP 138/74   Pulse 63   Ht 5' 7" (1.702 m)   Wt 155 lb (70.3 kg)   SpO2 97%   BMI 24.28 kg/m    General: Well developed, well nourished,female appearing in no acute distress. Head: Normocephalic, atraumatic. Neck: No carotid bruits. JVD not elevated.  Lungs: Respirations regular and unlabored, without wheezes or rales.  Heart: Regular rate and rhythm. No S3 or S4.  No murmur, no rubs, or gallops appreciated. Abdomen: Appears non-distended. No obvious abdominal masses. Msk:  Strength and tone appear normal for age. No obvious joint deformities or effusions. Extremities: No clubbing or cyanosis. No lower extremity edema.  Distal pedal pulses are 2+ bilaterally. Neuro: Alert and oriented X 3. Moves all extremities spontaneously. No focal deficits noted. Psych:  Responds to questions appropriately with a normal affect. Skin: No rashes or lesions noted  Wt Readings from Last 3 Encounters:  01/01/21 155 lb (70.3 kg)  12/29/20 150 lb (68 kg)  08/18/20 162 lb (73.5 kg)     Studies/Labs Reviewed:   EKG:  EKG  is ordered today. The ekg ordered today demonstrates sinus bradycardia, HR 56 with 1st degree AV block. TWI along V3-V4 which is similar to prior tracings dating back to 2018.  Recent Labs: 12/25/2020: ALT 23; BUN 16; Creatinine, Ser 1.28; Hemoglobin 12.5; Platelets 229; Potassium 4.5; Sodium 138; TSH 1.190   Lipid Panel    Component Value Date/Time   CHOL 212 (H) 12/25/2020 1322   TRIG 88 12/25/2020 1322   HDL 109 12/25/2020 1322   CHOLHDL 1.9 12/25/2020 1322   CHOLHDL 2.1 08/13/2020 1316   VLDL 13 12/23/2016 1141   LDLCALC 88 12/25/2020 1322   LDLCALC 89 08/13/2020 1316    Additional studies/ records that were reviewed today include:   Echocardiogram: 08/2017 Study Conclusions   - Left ventricle: The cavity size was normal. Wall thickness was  increased in a pattern of mild LVH. Systolic function was normal.  The estimated ejection fraction was in the range of 55% to 60%.  Doppler parameters are consistent with abnormal left ventricular  relaxation (grade 1 diastolic dysfunction).  - Aortic valve: Valve area (VTI): 2.73 cm^2. Valve area (Vmax):  2.17 cm^2. Valve area (Vmean): 2.31 cm^2.  - Technically adequate study.   Assessment:    1. Resistant hypertension   2. Dyslipidemia   3. Preoperative cardiovascular examination      Plan:   In order of problems listed above:  1. Resistant HTN - Her BP is well-controlled at 138/74 during today's visit and she reports it is typically well-controlled when checked at home unless she is experiencing hip pain.  - Will continue current medication regimen with Amlodipine 37m daily, Atenolol-Chlorthalidone 100-248mdaily, Benazepril 4072maily Clonidine 0.3mg47mily and Spironolactone 12.5mg 60mry other day. Creatinine was stable at 1.28 by recent labs on 12/25/2020.  2. HLD - Recent FLP showed total cholesterol of 212, triglycerides 88, HDL 109 and LDL 88. Continue Lovastatin 40mg 70my.   3. Preoperative Cardiac  Clearance - Cardiac clearance was not formally requested as she does not have a known history of CAD or CHF. While her activity is currently more limited due to her hip pain, she denies any recent anginal symptoms and was recently able to perform more than 4 METS without anginal symptoms. She does have  a baseline abnormal EKG which is similar to prior tracings from 2018 as outlined above. - RCRI Risk is overall low at 0.4% risk of a major cardiac event. No further cardiac testing is indicated at this time. I did encourage her to make [place] aware if she does experience anginal symptoms in the future, as stress testing could be arranged.     Medication Adjustments/Labs and Tests Ordered: Current medicines are reviewed at length with the patient today.  Concerns regarding medicines are outlined above.  Medication changes, Labs and Tests ordered today are listed in the Patient Instructions below. Patient Instructions  Medication Instructions:  Your physician recommends that you continue on your current medications as directed. Please refer to the Current Medication list given to you today.  *If you need a refill on your cardiac medications before your next appointment, please call your pharmacy*   Lab Work: None today If you have labs (blood work) drawn today and your tests are completely normal, you will receive your results only by: Marland Kitchen MyChart Message (if you have MyChart) OR . A paper copy in the mail If you have any lab test that is abnormal or we need to change your treatment, we will call you to review the results.   Testing/Procedures: None today   Follow-Up: At [facility], you and your health needs are our priority.  As part of our continuing mission to provide you with exceptional heart care, we have created designated Provider Care Teams.  These Care Teams include your primary Cardiologist (physician) and Advanced Practice Providers (APPs -  Physician Assistants and Nurse  Practitioners) who all work together to provide you with the care you need, when you need it.  We recommend signing up for the patient portal called "MyChart".  Sign up information is provided on this After Visit Summary.  MyChart is used to connect with patients for Virtual Visits (Telemedicine).  Patients are able to view lab/test results, encounter notes, upcoming appointments, etc.  Non-urgent messages can be sent to your provider as well.   To learn more about what you can do with MyChart, go to [email or web address].    Your next appointment:   12 month(s)  The format for your next appointment:   In Person  Provider:   You may see a cardiologist or one of the following Advanced Practice Providers on your designated Care Team:    Bernerd Pho, PA-C   Ermalinda Barrios, PA-C     Other Instructions None     Thank you for choosing [facility] !            Signed, Erma Heritage, PA-C  01/01/2021 7:28 PM    [facility] [address] [place], [place] [address] Phone: [phone] Fax: [phone]

## 2021-01-02 ENCOUNTER — Encounter: Payer: Self-pay | Admitting: Family Medicine

## 2021-01-02 ENCOUNTER — Encounter (HOSPITAL_COMMUNITY)
Admission: RE | Admit: 2021-01-02 | Discharge: 2021-01-02 | Disposition: A | Discharge status: Home / Self Care | Payer: Medicare HMO | Source: Ambulatory Visit | Attending: Orthopaedic Surgery | Admitting: Orthopaedic Surgery

## 2021-01-02 ENCOUNTER — Other Ambulatory Visit: Payer: Self-pay

## 2021-01-02 ENCOUNTER — Encounter (HOSPITAL_COMMUNITY): Payer: Self-pay

## 2021-01-02 DIAGNOSIS — Z01812 Encounter for preprocedural laboratory examination: Secondary | ICD-10-CM | POA: Insufficient documentation

## 2021-01-02 DIAGNOSIS — Z20822 Contact with and (suspected) exposure to covid-19: Secondary | ICD-10-CM | POA: Insufficient documentation

## 2021-01-02 LAB — GLUCOSE, CAPILLARY: Glucose-Capillary: 198 mg/dL — ABNORMAL HIGH (ref 70–99)

## 2021-01-02 LAB — TYPE AND SCREEN
ABO/RH(D): O POS
Antibody Screen: NEGATIVE

## 2021-01-02 LAB — SARS CORONAVIRUS 2 (TAT 6-24 HRS): SARS Coronavirus 2: NEGATIVE

## 2021-01-02 LAB — SURGICAL PCR SCREEN
MRSA, PCR: NEGATIVE
Staphylococcus aureus: NEGATIVE

## 2021-01-02 NOTE — Progress Notes (Addendum)
PCP: Tula Nakayama, MD Cardiologist: Kate Sable, MD  EKG: 01/01/21 CXR: 11/16/19 ECHO: 09/06/17 Stress Test: denies Cardiac Cath: denies  Fasting Blood Sugar- 130-200 Checks Blood Sugar__1-2_ times a day  OSA/CPAP:  No  ASA/Blood Thinners: No  covid test 01/02/21  Anesthesia Review:  No.  Patient denies cardiac history.  Note from cards 01/01/21.  Patient denies shortness of breath, fever, cough, and chest pain at PAT appointment.  Patient verbalized understanding of instructions provided today at the PAT appointment.  Patient asked to review instructions at home and day of surgery.

## 2021-01-02 NOTE — Assessment & Plan Note (Addendum)
Replacement is planned in the near future. Handicap sticker to be provided Limited supply of hydrocodone prescribed until Ortho takes over

## 2021-01-02 NOTE — Assessment & Plan Note (Signed)
DASH diet and commitment to daily physical activity for a minimum of 30 minutes discussed and encouraged, as a part of hypertension management. The importance of attaining a healthy weight is also discussed.  BP/Weight 01/01/2021 12/29/2020 09/16/2020 08/18/2020 05/22/2020 04/16/2020 0/90/5025  Systolic BP 615 488 457 [phone] 996  Diastolic BP 74 80 80 76 74 74 78  Wt. (Lbs) 155 150 - 162 161 - 161.8  BMI 24.28 23.49 - 25 24.84 - 24.97

## 2021-01-02 NOTE — Progress Notes (Signed)
Surgical Instructions    Your procedure is scheduled on 01/06/21.  Report to [facility] Main Entrance "A" at 10:00 A.M., then check in with the Admitting office.  Call this number if you have problems the morning of surgery:  [phone]   If you have any questions prior to your surgery date call [phone]: Open Monday-Friday 8am-4pm    Remember:  Do not eat after midnight the night before your surgery  You may drink clear liquids until 9:00 the morning of your surgery.   Clear liquids allowed are: Water, Non-Citrus Juices (without pulp), Carbonated Beverages, Clear Tea, Black Coffee Only, and Gatorade.  Please drink sugar free drinks.   Please complete your bottle of water that was provided to you by 9:00 the morning of surgery.  Please, if able, drink it in one setting. DO NOT SIP.    Take these medicines the morning of surgery with A SIP OF WATER. amLODipine (NORVASC)  atenolol-chlorthalidone (TENORETIC)  lovastatin (MEVACOR) acetaminophen (TYLENOL) - if needed HYDROcodone-acetaminophen (NORCO/VICODIN) - if needed  As of today, STOP taking any Aspirin (unless otherwise instructed by your surgeon) Aleve, Naproxen, Ibuprofen, Motrin, Advil, Goody's, BC's, all herbal medications, fish oil, and all vitamins.   WHAT DO I DO ABOUT MY DIABETES MEDICATION?   Marland Kitchen Do not take oral diabetes medicines (pills) the morning of surgery.   HOW TO MANAGE YOUR DIABETES BEFORE AND AFTER SURGERY  Why is it important to control my blood sugar before and after surgery? . Improving blood sugar levels before and after surgery helps healing and can limit problems. . A way of improving blood sugar control is eating a healthy diet by: o  Eating less sugar and carbohydrates o  Increasing activity/exercise o  Talking with your doctor about reaching your blood sugar goals . High blood sugars (greater than 180 mg/dL) can raise your risk of infections and slow your recovery, so you will need to focus  on controlling your diabetes during the weeks before surgery. . Make sure that the doctor who takes care of your diabetes knows about your planned surgery including the date and location.  How do I manage my blood sugar before surgery? . Check your blood sugar at least 4 times a day, starting 2 days before surgery, to make sure that the level is not too high or low. . Check your blood sugar the morning of your surgery when you wake up and every 2 hours until you get to the Short Stay unit. o If your blood sugar is less than 70 mg/dL, you will need to treat for low blood sugar: - Do not take insulin. - Treat a low blood sugar (less than 70 mg/dL) with  cup of clear juice (cranberry or apple), 4 glucose tablets, OR glucose gel. - Recheck blood sugar in 15 minutes after treatment (to make sure it is greater than 70 mg/dL). If your blood sugar is not greater than 70 mg/dL on recheck, call [phone] for further instructions. . Report your blood sugar to the short stay nurse when you get to Short Stay.  . If you are admitted to the hospital after surgery: o Your blood sugar will be checked by the staff and you will probably be given insulin after surgery (instead of oral diabetes medicines) to make sure you have good blood sugar levels. o The goal for blood sugar control after surgery is 80-180 mg/dL.  Do not wear jewelry, make up, or nail polish            Do not wear lotions, powders, perfumes or deodorant.            Do not shave 48 hours prior to surgery.              Do not bring valuables to the hospital.            [facility] is not responsible for any belongings or valuables.  Do NOT Smoke (Tobacco/Vaping) or drink Alcohol 24 hours prior to your procedure If you use a CPAP at night, you may bring all equipment for your overnight stay.   Contacts, glasses, dentures or bridgework may not be worn into surgery, please bring cases for these belongings   For patients  admitted to the hospital, discharge time will be determined by your treatment team.   Patients discharged the day of surgery will not be allowed to drive home, and someone needs to stay with them for 24 hours.    Special instructions:   [place]- Preparing For Surgery  Before surgery, you can play an important role. Because skin is not sterile, your skin needs to be as free of germs as possible. You can reduce the number of germs on your skin by washing with CHG (chlorahexidine gluconate) Soap before surgery.  CHG is an antiseptic cleaner which kills germs and bonds with the skin to continue killing germs even after washing.    Oral Hygiene is also important to reduce your risk of infection.  Remember - BRUSH YOUR TEETH THE MORNING OF SURGERY WITH YOUR REGULAR TOOTHPASTE  Please do not use if you have an allergy to CHG or antibacterial soaps. If your skin becomes reddened/irritated stop using the CHG.  Do not shave (including legs and underarms) for at least 48 hours prior to first CHG shower. It is OK to shave your face.  Please follow these instructions carefully.   1. Shower the NIGHT BEFORE SURGERY and the MORNING OF SURGERY  2. If you chose to wash your hair, wash your hair first as usual with your normal shampoo.  3. After you shampoo, rinse your hair and body thoroughly to remove the shampoo.  4. Wash Face and genitals (private parts) with your normal soap.   5.  Shower the NIGHT BEFORE SURGERY and the MORNING OF SURGERY with CHG Soap.   6. Use CHG Soap as you would any other liquid soap. You can apply CHG directly to the skin and wash gently with a scrungie or a clean washcloth.   7. Apply the CHG Soap to your body ONLY FROM THE NECK DOWN.  Do not use on open wounds or open sores. Avoid contact with your eyes, ears, mouth and genitals (private parts). Wash Face and genitals (private parts)  with your normal soap.   8. Wash thoroughly, paying special attention to the area  where your surgery will be performed.  9. Thoroughly rinse your body with warm water from the neck down.  10. DO NOT shower/wash with your normal soap after using and rinsing off the CHG Soap.  11. Pat yourself dry with a CLEAN TOWEL.  12. Wear CLEAN PAJAMAS to bed the night before surgery  13. Place CLEAN SHEETS on your bed the night before your surgery  14. DO NOT SLEEP WITH PETS.   Day of Surgery: Wear Clean/Comfortable clothing the morning of surgery Do not apply any deodorants/lotions.  Remember to brush your teeth WITH YOUR REGULAR TOOTHPASTE.   Please read over the following fact sheets that you were given.

## 2021-01-02 NOTE — Assessment & Plan Note (Signed)
Controlled, no change in medication [name] is reminded of the importance of commitment to daily physical activity for 30 minutes or more, as able and the need to limit carbohydrate intake to 30 to 60 grams per meal to help with blood sugar control.   The need to take medication as prescribed, test blood sugar as directed, and to call between visits if there is a concern that blood sugar is uncontrolled is also discussed.   [name] is reminded of the importance of daily foot exam, annual eye examination, and good blood sugar, blood pressure and cholesterol control.  Diabetic Labs Latest Ref Rng & Units 12/25/2020 08/13/2020 03/28/2020 01/04/2020 11/16/2019  HbA1c 4.8 - 5.6 % 7.4(H) 7.3(H) 7.0(H) 7.5(H) -  Microalbumin mg/dL - 0.6 - - -  Micro/Creat Ratio <30 mcg/mg creat - - - - -  Chol 100 - 199 mg/dL 212(H) 209(H) - 205(H) -  HDL >39 mg/dL 109 100 - 94 -  Calc LDL 0 - 99 mg/dL 88 89 - 96 -  Triglycerides 0 - 149 mg/dL 88 107 - 66 -  Creatinine 0.57 - 1.00 mg/dL 1.28(H) 1.38(H) 1.75(H) 1.51(H) 1.49(H)   BP/Weight 01/01/2021 12/29/2020 09/16/2020 08/18/2020 05/22/2020 04/16/2020 5/62/1308  Systolic BP 657 846 962 952 841 324 401  Diastolic BP 74 80 80 76 74 74 78  Wt. (Lbs) 155 150 - 162 161 - 161.8  BMI 24.28 23.49 - 25 24.84 - 24.97   Foot/eye exam completion dates Latest Ref Rng & Units 04/07/2020 12/26/2019  Eye Exam No Retinopathy - No Retinopathy  Foot Form Completion - Done -

## 2021-01-02 NOTE — Assessment & Plan Note (Signed)
Hyperlipidemia:Low fat diet discussed and encouraged.   Lipid Panel  Lab Results  Component Value Date   CHOL 212 (H) 12/25/2020   HDL 109 12/25/2020   LDLCALC 88 12/25/2020   TRIG 88 12/25/2020   CHOLHDL 1.9 12/25/2020     Needs to reduce fat intake, no med change

## 2021-01-05 ENCOUNTER — Telehealth: Payer: Self-pay | Admitting: *Deleted

## 2021-01-05 MED ORDER — TRANEXAMIC ACID-NACL 1000-0.7 MG/100ML-% IV SOLN
1000.0000 mg | INTRAVENOUS | Status: DC
  Filled 2021-01-05 (×2): qty 100

## 2021-01-05 MED ORDER — TRANEXAMIC ACID-NACL 1000-0.7 MG/100ML-% IV SOLN
1000.0000 mg | INTRAVENOUS | Status: DC
  Filled 2021-01-05 (×3): qty 100

## 2021-01-05 MED ORDER — TRANEXAMIC ACID-NACL 1000-0.7 MG/100ML-% IV SOLN
1000.0000 mg | INTRAVENOUS | Status: DC
  Filled 2021-01-05: qty 100

## 2021-01-05 NOTE — Care Plan (Signed)
RNCM call to patient to discuss her upcoming Right total hip arthroplasty with Dr. Ninfa Linden on 01/06/21. She is an Ortho bundle patient through [facility] and is agreeable to case management. She lives with her husband, who will be helping after discharge home. She will need a FWW and declines a 3in1/BSC until after surgery to see how she is doing functionally. This will be ordered through [facility] to be delivered to her hospital room prior to discharge. Anticipate HHPT will be needed after short hospital stay. Referral made to [facility] (formerly [facility]) after choice provided. Reviewed all post-op care instructions with her. Will continue to follow for needs.

## 2021-01-05 NOTE — Telephone Encounter (Signed)
Ortho bundle pre-op call completed. 

## 2021-01-06 ENCOUNTER — Encounter (HOSPITAL_COMMUNITY): Payer: Self-pay | Admitting: Orthopaedic Surgery

## 2021-01-06 ENCOUNTER — Observation Stay (HOSPITAL_COMMUNITY)
Admission: RE | Admit: 2021-01-06 | Discharge: 2021-01-08 | Disposition: A | Discharge status: Home / Self Care | Payer: Medicare HMO | Attending: Orthopaedic Surgery | Admitting: Orthopaedic Surgery

## 2021-01-06 ENCOUNTER — Ambulatory Visit (HOSPITAL_COMMUNITY): Payer: Medicare HMO

## 2021-01-06 ENCOUNTER — Encounter (HOSPITAL_COMMUNITY)
Admission: RE | Disposition: A | Discharge status: Home / Self Care | Payer: Self-pay | Source: Home / Self Care | Attending: Orthopaedic Surgery

## 2021-01-06 ENCOUNTER — Other Ambulatory Visit: Payer: Self-pay

## 2021-01-06 ENCOUNTER — Ambulatory Visit (HOSPITAL_COMMUNITY): Payer: Medicare HMO | Admitting: Anesthesiology

## 2021-01-06 ENCOUNTER — Observation Stay (HOSPITAL_COMMUNITY): Payer: Medicare HMO

## 2021-01-06 DIAGNOSIS — Z7982 Long term (current) use of aspirin: Secondary | ICD-10-CM | POA: Insufficient documentation

## 2021-01-06 DIAGNOSIS — E1122 Type 2 diabetes mellitus with diabetic chronic kidney disease: Secondary | ICD-10-CM | POA: Diagnosis not present

## 2021-01-06 DIAGNOSIS — Z7984 Long term (current) use of oral hypoglycemic drugs: Secondary | ICD-10-CM | POA: Diagnosis not present

## 2021-01-06 DIAGNOSIS — Z96641 Presence of right artificial hip joint: Secondary | ICD-10-CM

## 2021-01-06 DIAGNOSIS — Z79899 Other long term (current) drug therapy: Secondary | ICD-10-CM | POA: Insufficient documentation

## 2021-01-06 DIAGNOSIS — Z888 Allergy status to other drugs, medicaments and biological substances status: Secondary | ICD-10-CM | POA: Insufficient documentation

## 2021-01-06 DIAGNOSIS — M1611 Unilateral primary osteoarthritis, right hip: Principal | ICD-10-CM | POA: Diagnosis present

## 2021-01-06 DIAGNOSIS — Z419 Encounter for procedure for purposes other than remedying health state, unspecified: Secondary | ICD-10-CM

## 2021-01-06 DIAGNOSIS — N1832 Chronic kidney disease, stage 3b: Secondary | ICD-10-CM | POA: Diagnosis not present

## 2021-01-06 DIAGNOSIS — M247 Protrusio acetabuli: Secondary | ICD-10-CM | POA: Insufficient documentation

## 2021-01-06 DIAGNOSIS — I129 Hypertensive chronic kidney disease with stage 1 through stage 4 chronic kidney disease, or unspecified chronic kidney disease: Secondary | ICD-10-CM | POA: Insufficient documentation

## 2021-01-06 DIAGNOSIS — D631 Anemia in chronic kidney disease: Secondary | ICD-10-CM | POA: Diagnosis not present

## 2021-01-06 DIAGNOSIS — Z471 Aftercare following joint replacement surgery: Secondary | ICD-10-CM | POA: Diagnosis not present

## 2021-01-06 HISTORY — PX: TOTAL HIP ARTHROPLASTY: SHX124

## 2021-01-06 LAB — GLUCOSE, CAPILLARY
Glucose-Capillary: 160 mg/dL — ABNORMAL HIGH (ref 70–99)
Glucose-Capillary: 146 mg/dL — ABNORMAL HIGH (ref 70–99)
Glucose-Capillary: 169 mg/dL — ABNORMAL HIGH (ref 70–99)
Glucose-Capillary: 172 mg/dL — ABNORMAL HIGH (ref 70–99)

## 2021-01-06 LAB — ABO/RH: ABO/RH(D): O POS

## 2021-01-06 SURGERY — ARTHROPLASTY, HIP, TOTAL, ANTERIOR APPROACH
Anesthesia: General | Site: Hip | Laterality: Right

## 2021-01-06 MED ORDER — ACETAMINOPHEN 325 MG PO TABS: 325.0000 mg | ORAL_TABLET | Freq: Four times a day (QID) | ORAL | Status: DC | PRN

## 2021-01-06 MED ORDER — PANTOPRAZOLE SODIUM 40 MG PO TBEC
40.0000 mg | DELAYED_RELEASE_TABLET | Freq: Every day | ORAL | Status: DC
  Administered 2021-01-06 – 2021-01-08 (×3): 40 mg via ORAL
  Filled 2021-01-06 (×3): qty 1

## 2021-01-06 MED ORDER — GLIPIZIDE ER 2.5 MG PO TB24
2.5000 mg | ORAL_TABLET | ORAL | Status: DC
  Administered 2021-01-08: 2.5 mg via ORAL
  Filled 2021-01-06: qty 1

## 2021-01-06 MED ORDER — MENTHOL 3 MG MT LOZG: 1.0000 | LOZENGE | OROMUCOSAL | Status: DC | PRN

## 2021-01-06 MED ORDER — CEFAZOLIN SODIUM-DEXTROSE 1-4 GM/50ML-% IV SOLN
1.0000 g | Freq: Four times a day (QID) | INTRAVENOUS | Status: AC
  Administered 2021-01-06 – 2021-01-07 (×2): 1 g via INTRAVENOUS
  Filled 2021-01-06 (×2): qty 50

## 2021-01-06 MED ORDER — ONDANSETRON HCL 4 MG/2ML IJ SOLN: 4.0000 mg | Freq: Once | INTRAMUSCULAR | Status: DC | PRN

## 2021-01-06 MED ORDER — ONDANSETRON HCL 4 MG/2ML IJ SOLN
INTRAMUSCULAR | Status: DC | PRN
  Administered 2021-01-06: 4 mg via INTRAVENOUS

## 2021-01-06 MED ORDER — FENTANYL CITRATE (PF) 250 MCG/5ML IJ SOLN
INTRAMUSCULAR | Status: AC
  Filled 2021-01-06: qty 5

## 2021-01-06 MED ORDER — CHLORHEXIDINE GLUCONATE 0.12 % MT SOLN
15.0000 mL | Freq: Once | OROMUCOSAL | Status: AC
  Administered 2021-01-06: 15 mL via OROMUCOSAL
  Filled 2021-01-06: qty 15

## 2021-01-06 MED ORDER — OXYCODONE HCL 5 MG PO TABS: 5.0000 mg | ORAL_TABLET | Freq: Once | ORAL | Status: DC | PRN

## 2021-01-06 MED ORDER — LACTATED RINGERS IV SOLN: INTRAVENOUS | Status: DC | PRN

## 2021-01-06 MED ORDER — OXYCODONE HCL 5 MG/5ML PO SOLN
5.0000 mg | Freq: Once | ORAL | Status: DC | PRN
Start: 2021-01-06 — End: 2021-01-06

## 2021-01-06 MED ORDER — SODIUM CHLORIDE 0.9 % IV SOLN: INTRAVENOUS | Status: DC

## 2021-01-06 MED ORDER — METHOCARBAMOL 1000 MG/10ML IJ SOLN
500.0000 mg | Freq: Four times a day (QID) | INTRAVENOUS | Status: DC | PRN
  Filled 2021-01-06: qty 5

## 2021-01-06 MED ORDER — METOCLOPRAMIDE HCL 5 MG/ML IJ SOLN
5.0000 mg | Freq: Three times a day (TID) | INTRAMUSCULAR | Status: DC | PRN
  Administered 2021-01-06: 10 mg via INTRAVENOUS

## 2021-01-06 MED ORDER — LACTATED RINGERS IV SOLN: INTRAVENOUS | Status: DC

## 2021-01-06 MED ORDER — ASPIRIN 81 MG PO CHEW
81.0000 mg | CHEWABLE_TABLET | Freq: Two times a day (BID) | ORAL | Status: DC
  Administered 2021-01-06 – 2021-01-08 (×4): 81 mg via ORAL
  Filled 2021-01-06 (×4): qty 1

## 2021-01-06 MED ORDER — PROPOFOL 10 MG/ML IV BOLUS
INTRAVENOUS | Status: DC | PRN
  Administered 2021-01-06 (×3): 10 mg via INTRAVENOUS

## 2021-01-06 MED ORDER — AMLODIPINE BESYLATE 10 MG PO TABS
10.0000 mg | ORAL_TABLET | Freq: Every day | ORAL | Status: DC
  Administered 2021-01-07 – 2021-01-08 (×2): 10 mg via ORAL
  Filled 2021-01-06 (×2): qty 1

## 2021-01-06 MED ORDER — ATENOLOL-CHLORTHALIDONE 100-25 MG PO TABS: 1.0000 | ORAL_TABLET | Freq: Every day | ORAL | Status: DC

## 2021-01-06 MED ORDER — HYDROMORPHONE HCL 1 MG/ML IJ SOLN
0.5000 mg | INTRAMUSCULAR | Status: DC | PRN
  Administered 2021-01-06: 1 mg via INTRAVENOUS
  Filled 2021-01-06: qty 1

## 2021-01-06 MED ORDER — FENTANYL CITRATE (PF) 100 MCG/2ML IJ SOLN
INTRAMUSCULAR | Status: DC | PRN
  Administered 2021-01-06 (×2): 25 ug via INTRAVENOUS
  Administered 2021-01-06: 50 ug via INTRAVENOUS
  Administered 2021-01-06: 25 ug via INTRAVENOUS

## 2021-01-06 MED ORDER — DOCUSATE SODIUM 100 MG PO CAPS
100.0000 mg | ORAL_CAPSULE | Freq: Two times a day (BID) | ORAL | Status: DC
  Administered 2021-01-06 – 2021-01-08 (×4): 100 mg via ORAL
  Filled 2021-01-06 (×4): qty 1

## 2021-01-06 MED ORDER — OXYCODONE HCL 5 MG PO TABS
10.0000 mg | ORAL_TABLET | ORAL | Status: DC | PRN
  Administered 2021-01-07: 15 mg via ORAL
  Administered 2021-01-07: 10 mg via ORAL
  Administered 2021-01-08: 15 mg via ORAL
  Filled 2021-01-06: qty 3
  Filled 2021-01-06: qty 2
  Filled 2021-01-06: qty 3

## 2021-01-06 MED ORDER — INSULIN ASPART 100 UNIT/ML ~~LOC~~ SOLN
0.0000 [IU] | Freq: Three times a day (TID) | SUBCUTANEOUS | Status: DC
  Administered 2021-01-07 (×2): 3 [IU] via SUBCUTANEOUS
  Administered 2021-01-07: 5 [IU] via SUBCUTANEOUS
  Administered 2021-01-08 (×2): 3 [IU] via SUBCUTANEOUS

## 2021-01-06 MED ORDER — AMISULPRIDE (ANTIEMETIC) 5 MG/2ML IV SOLN
INTRAVENOUS | Status: AC
  Filled 2021-01-06: qty 4

## 2021-01-06 MED ORDER — SPIRONOLACTONE 12.5 MG HALF TABLET
12.5000 mg | ORAL_TABLET | ORAL | Status: DC
  Administered 2021-01-07: 12.5 mg via ORAL
  Filled 2021-01-06: qty 1

## 2021-01-06 MED ORDER — OXYCODONE HCL 5 MG PO TABS
5.0000 mg | ORAL_TABLET | ORAL | Status: DC | PRN
  Administered 2021-01-06 – 2021-01-07 (×2): 5 mg via ORAL
  Filled 2021-01-06 (×2): qty 1

## 2021-01-06 MED ORDER — CHLORTHALIDONE 25 MG PO TABS
25.0000 mg | ORAL_TABLET | Freq: Every day | ORAL | Status: DC
  Administered 2021-01-07 – 2021-01-08 (×2): 25 mg via ORAL
  Filled 2021-01-06 (×2): qty 1

## 2021-01-06 MED ORDER — PHENYLEPHRINE HCL-NACL 10-0.9 MG/250ML-% IV SOLN
INTRAVENOUS | Status: DC | PRN
  Administered 2021-01-06: 15 ug/min via INTRAVENOUS

## 2021-01-06 MED ORDER — ONDANSETRON HCL 4 MG/2ML IJ SOLN
4.0000 mg | Freq: Four times a day (QID) | INTRAMUSCULAR | Status: DC | PRN
  Administered 2021-01-06: 4 mg via INTRAVENOUS
  Filled 2021-01-06 (×2): qty 2

## 2021-01-06 MED ORDER — CALCIUM CARBONATE-VITAMIN D 500-200 MG-UNIT PO TABS
1.0000 | ORAL_TABLET | Freq: Every day | ORAL | Status: DC
  Administered 2021-01-07 – 2021-01-08 (×2): 1 via ORAL
  Filled 2021-01-06 (×2): qty 1

## 2021-01-06 MED ORDER — METOCLOPRAMIDE HCL 5 MG PO TABS
5.0000 mg | ORAL_TABLET | Freq: Three times a day (TID) | ORAL | Status: DC | PRN
Start: 2021-01-06 — End: 2021-01-08
  Filled 2021-01-06: qty 2

## 2021-01-06 MED ORDER — SODIUM CHLORIDE 0.9 % IR SOLN
Status: DC | PRN
  Administered 2021-01-06: 3000 mL

## 2021-01-06 MED ORDER — ALUM & MAG HYDROXIDE-SIMETH 200-200-20 MG/5ML PO SUSP
30.0000 mL | ORAL | Status: DC | PRN
Start: 2021-01-06 — End: 2021-01-08

## 2021-01-06 MED ORDER — ONDANSETRON HCL 4 MG PO TABS: 4.0000 mg | ORAL_TABLET | Freq: Four times a day (QID) | ORAL | Status: DC | PRN

## 2021-01-06 MED ORDER — AMISULPRIDE (ANTIEMETIC) 5 MG/2ML IV SOLN
10.0000 mg | Freq: Once | INTRAVENOUS | Status: AC | PRN
  Administered 2021-01-06: 10 mg via INTRAVENOUS

## 2021-01-06 MED ORDER — BENAZEPRIL HCL 20 MG PO TABS
40.0000 mg | ORAL_TABLET | Freq: Every day | ORAL | Status: DC
  Administered 2021-01-07 – 2021-01-08 (×2): 40 mg via ORAL
  Filled 2021-01-06 (×2): qty 2

## 2021-01-06 MED ORDER — INSULIN ASPART 100 UNIT/ML ~~LOC~~ SOLN: 0.0000 [IU] | Freq: Every day | SUBCUTANEOUS | Status: DC

## 2021-01-06 MED ORDER — FENTANYL CITRATE (PF) 100 MCG/2ML IJ SOLN
25.0000 ug | INTRAMUSCULAR | Status: DC | PRN
  Administered 2021-01-06 (×3): 25 ug via INTRAVENOUS

## 2021-01-06 MED ORDER — CLONIDINE HCL 0.3 MG PO TABS
0.4500 mg | ORAL_TABLET | Freq: Every day | ORAL | Status: DC
  Administered 2021-01-06 – 2021-01-07 (×2): 0.45 mg via ORAL
  Filled 2021-01-06: qty 5
  Filled 2021-01-06: qty 1.5
  Filled 2021-01-06: qty 5
  Filled 2021-01-06: qty 1.5
  Filled 2021-01-06: qty 5
  Filled 2021-01-06: qty 1.5

## 2021-01-06 MED ORDER — PROPOFOL 500 MG/50ML IV EMUL
INTRAVENOUS | Status: DC | PRN
  Administered 2021-01-06: 50 ug/kg/min via INTRAVENOUS

## 2021-01-06 MED ORDER — TRANEXAMIC ACID-NACL 1000-0.7 MG/100ML-% IV SOLN
INTRAVENOUS | Status: DC | PRN
  Administered 2021-01-06: 1000 mg via INTRAVENOUS

## 2021-01-06 MED ORDER — METHOCARBAMOL 500 MG PO TABS: 500.0000 mg | ORAL_TABLET | Freq: Four times a day (QID) | ORAL | Status: DC | PRN

## 2021-01-06 MED ORDER — ORAL CARE MOUTH RINSE: 15.0000 mL | Freq: Once | OROMUCOSAL | Status: AC

## 2021-01-06 MED ORDER — CEFAZOLIN SODIUM-DEXTROSE 2-4 GM/100ML-% IV SOLN
2.0000 g | INTRAVENOUS | Status: AC
  Administered 2021-01-06: 2 g via INTRAVENOUS
  Filled 2021-01-06: qty 100

## 2021-01-06 MED ORDER — POVIDONE-IODINE 10 % EX SWAB
2.0000 "application " | Freq: Once | CUTANEOUS | Status: AC
  Administered 2021-01-06: 2 via TOPICAL

## 2021-01-06 MED ORDER — FENTANYL CITRATE (PF) 100 MCG/2ML IJ SOLN
INTRAMUSCULAR | Status: AC
  Filled 2021-01-06: qty 2

## 2021-01-06 MED ORDER — 0.9 % SODIUM CHLORIDE (POUR BTL) OPTIME
TOPICAL | Status: DC | PRN
  Administered 2021-01-06: 1000 mL

## 2021-01-06 MED ORDER — METOCLOPRAMIDE HCL 5 MG/ML IJ SOLN
INTRAMUSCULAR | Status: AC
  Filled 2021-01-06: qty 2

## 2021-01-06 MED ORDER — ATENOLOL 50 MG PO TABS
100.0000 mg | ORAL_TABLET | Freq: Every day | ORAL | Status: DC
  Administered 2021-01-07 – 2021-01-08 (×2): 100 mg via ORAL
  Filled 2021-01-06 (×2): qty 2

## 2021-01-06 MED ORDER — POLYETHYLENE GLYCOL 3350 17 G PO PACK: 17.0000 g | PACK | Freq: Every day | ORAL | Status: DC | PRN

## 2021-01-06 MED ORDER — ADULT MULTIVITAMIN W/MINERALS CH
1.0000 | ORAL_TABLET | Freq: Every day | ORAL | Status: DC
  Administered 2021-01-07 – 2021-01-08 (×2): 1 via ORAL
  Filled 2021-01-06 (×2): qty 1

## 2021-01-06 MED ORDER — PHENOL 1.4 % MT LIQD: 1.0000 | OROMUCOSAL | Status: DC | PRN

## 2021-01-06 SURGICAL SUPPLY — 54 items
APL SKNCLS STERI-STRIP NONHPOA (GAUZE/BANDAGES/DRESSINGS) ×1
BENZOIN TINCTURE PRP APPL 2/3 (GAUZE/BANDAGES/DRESSINGS) ×2 IMPLANT
BLADE SAW SGTL 18X1.27X75 (BLADE) ×2 IMPLANT
CLOSURE STERI-STRIP 1/4X4 (GAUZE/BANDAGES/DRESSINGS) ×1 IMPLANT
COVER SURGICAL LIGHT HANDLE (MISCELLANEOUS) ×2 IMPLANT
COVER WAND RF STERILE (DRAPES) ×2 IMPLANT
DRAPE C-ARM 42X72 X-RAY (DRAPES) ×2 IMPLANT
DRAPE STERI IOBAN 125X83 (DRAPES) ×2 IMPLANT
DRAPE U-SHAPE 47X51 STRL (DRAPES) ×6 IMPLANT
DRSG AQUACEL AG ADV 3.5X10 (GAUZE/BANDAGES/DRESSINGS) ×2 IMPLANT
DRSG AQUACEL AG ADV 3.5X14 (GAUZE/BANDAGES/DRESSINGS) ×1 IMPLANT
DURAPREP 26ML APPLICATOR (WOUND CARE) ×2 IMPLANT
ELECT BLADE 4.0 EZ CLEAN MEGAD (MISCELLANEOUS) ×2
ELECT REM PT RETURN 9FT ADLT (ELECTROSURGICAL) ×2
ELECTRODE BLDE 4.0 EZ CLN MEGD (MISCELLANEOUS) ×1 IMPLANT
ELECTRODE REM PT RTRN 9FT ADLT (ELECTROSURGICAL) ×1 IMPLANT
FACESHIELD WRAPAROUND (MASK) ×4 IMPLANT
FACESHIELD WRAPAROUND OR TEAM (MASK) ×2 IMPLANT
GLOVE ECLIPSE 8.0 STRL XLNG CF (GLOVE) ×2 IMPLANT
GLOVE ORTHO TXT STRL SZ7.5 (GLOVE) ×4 IMPLANT
GLOVE SRG 8 PF TXTR STRL LF DI (GLOVE) ×2 IMPLANT
GLOVE SURG UNDER POLY LF SZ8 (GLOVE) ×4
GOWN STRL REUS W/ TWL LRG LVL3 (GOWN DISPOSABLE) ×2 IMPLANT
GOWN STRL REUS W/ TWL XL LVL3 (GOWN DISPOSABLE) ×2 IMPLANT
GOWN STRL REUS W/TWL LRG LVL3 (GOWN DISPOSABLE) ×4
GOWN STRL REUS W/TWL XL LVL3 (GOWN DISPOSABLE) ×4
HANDPIECE INTERPULSE COAX TIP (DISPOSABLE) ×2
HEAD M SROM 36MM 2 (Hips) IMPLANT
KIT BASIN OR (CUSTOM PROCEDURE TRAY) ×2 IMPLANT
KIT TURNOVER KIT B (KITS) ×2 IMPLANT
LINER ACETAB NEUTRAL 36ID 520D (Liner) ×1 IMPLANT
MANIFOLD NEPTUNE II (INSTRUMENTS) ×2 IMPLANT
NS IRRIG 1000ML POUR BTL (IV SOLUTION) ×2 IMPLANT
PACK TOTAL JOINT (CUSTOM PROCEDURE TRAY) ×2 IMPLANT
PAD ARMBOARD 7.5X6 YLW CONV (MISCELLANEOUS) ×2 IMPLANT
PIN SECTOR W/GRIP ACE CUP 52MM (Hips) ×1 IMPLANT
SCREW 6.5MMX25MM (Screw) ×1 IMPLANT
SET HNDPC FAN SPRY TIP SCT (DISPOSABLE) ×1 IMPLANT
SROM M HEAD 36MM 2 (Hips) ×2 IMPLANT
STAPLER VISISTAT 35W (STAPLE) ×1 IMPLANT
STEM CORAIL KA10 (Stem) ×1 IMPLANT
STRIP CLOSURE SKIN 1/2X4 (GAUZE/BANDAGES/DRESSINGS) ×4 IMPLANT
SUT ETHIBOND NAB CT1 #1 30IN (SUTURE) ×2 IMPLANT
SUT VIC AB 0 CT1 27 (SUTURE) ×4
SUT VIC AB 0 CT1 27XBRD ANBCTR (SUTURE) ×1 IMPLANT
SUT VIC AB 1 CT1 27 (SUTURE) ×4
SUT VIC AB 1 CT1 27XBRD ANBCTR (SUTURE) ×1 IMPLANT
SUT VIC AB 2-0 CT1 27 (SUTURE) ×4
SUT VIC AB 2-0 CT1 TAPERPNT 27 (SUTURE) ×1 IMPLANT
TOWEL GREEN STERILE (TOWEL DISPOSABLE) ×2 IMPLANT
TOWEL GREEN STERILE FF (TOWEL DISPOSABLE) ×2 IMPLANT
TRAY FOLEY W/BAG SLVR 16FR (SET/KITS/TRAYS/PACK) ×2
TRAY FOLEY W/BAG SLVR 16FR ST (SET/KITS/TRAYS/PACK) IMPLANT
WATER STERILE IRR 1000ML POUR (IV SOLUTION) ×4 IMPLANT

## 2021-01-06 NOTE — Brief Op Note (Signed)
01/06/2021  1:38 PM  PATIENT:  Mikayla Jenkins  75 y.o. female  PRE-OPERATIVE DIAGNOSIS:  Osteoarthritis Right Hip  POST-OPERATIVE DIAGNOSIS:  Osteoarthritis Right Hip  PROCEDURE:  Procedure(s): RIGHT TOTAL HIP ARTHROPLASTY ANTERIOR APPROACH (Right)  SURGEON:  Surgeon(s) and Role:    Mcarthur Rossetti, MD - Primary  PHYSICIAN ASSISTANT:  Benita Stabile, PA-C  ANESTHESIA:   spinal and general  EBL:  300 mL   COUNTS:  YES  DICTATION: .Other Dictation: Dictation Number [id number]  PLAN OF CARE: Admit for overnight observation  PATIENT DISPOSITION:  PACU - hemodynamically stable.   Delay start of Pharmacological VTE agent (>24hrs) due to surgical blood loss or risk of bleeding: no

## 2021-01-06 NOTE — Anesthesia Procedure Notes (Signed)
Procedure Name: MAC Date/Time: 01/06/2021 12:05 PM Performed by: Oletta Lamas, CRNA Pre-anesthesia Checklist: Patient identified, Emergency Drugs available, Suction available and Patient being monitored Patient Re-evaluated:Patient Re-evaluated prior to induction Oxygen Delivery Method: Simple face mask

## 2021-01-06 NOTE — Transfer of Care (Signed)
Immediate Anesthesia Transfer of Care Note  Patient: Mikayla Jenkins  Procedure(s) Performed: RIGHT TOTAL HIP ARTHROPLASTY ANTERIOR APPROACH (Right Hip)  Patient Location: PACU  Anesthesia Type:General  Level of Consciousness: drowsy and patient cooperative  Airway & Oxygen Therapy: Patient Spontanous Breathing and Patient connected to face mask oxygen  Post-op Assessment: Report given to RN and Post -op Vital signs reviewed and stable  Post vital signs: Reviewed and stable  Last Vitals:  Vitals Value Taken Time  BP 91/48 01/06/21 1352  Temp    Pulse 50 01/06/21 1354  Resp 16 01/06/21 1354  SpO2 100 % 01/06/21 1354  Vitals shown include unvalidated device data.  Last Pain:  Vitals:   01/06/21 1045  TempSrc:   PainSc: 0-No pain         Complications: No complications documented.

## 2021-01-06 NOTE — Care Plan (Signed)
Ortho Bundle Case Management Note  Patient Details  Name: Mikayla Jenkins MRN: [id number] Date of Birth: Feb 25, 1946   [facility] call to patient to discuss her upcoming Right total hip arthroplasty with Dr. Ninfa Linden on 01/06/21. She is an Ortho bundle patient through [facility] and is agreeable to case management. She lives with her husband, who will be helping after discharge home. She will need a FWW and declines a 3in1/BSC until after surgery to see how she is doing functionally. This will be ordered through [facility] to be delivered to her hospital room prior to discharge. Anticipate HHPT will be needed after short hospital stay. Referral made to [facility] (formerly [facility]) after choice provided. Reviewed all post-op care instructions with her. Will continue to follow for needs.                  DME Arranged:  Walker rolling (Possibly a 3in1/BSC; patient declined, but wants to see how she does with therapy) DME Agency:  [facility]  HH Arranged:  PT [place] Agency:  [facility] (now [facility])  Additional Comments: Please contact me with any questions of if this plan should need to change.  Jamse Arn, RN, BSN, [employer]  [phone] 01/06/2021, 2:55 PM

## 2021-01-06 NOTE — Addendum Note (Signed)
Addendum  created 01/06/21 1827 by Lidia Collum, MD   Child order released for a procedure order, Clinical Note Signed, Intraprocedure Blocks edited

## 2021-01-06 NOTE — Evaluation (Signed)
Physical Therapy Evaluation Patient Details Name: Mikayla Jenkins MRN: [id number] DOB: 10-Aug-1946 Today's Date: 01/06/2021   History of Present Illness  Pt adm 3/22 for rt THR direct anterior approach. PMH - DM, HTN, arthritis  Clinical Impression  Pt admitted with above diagnosis and presents to PT with functional limitations due to deficits listed below (See PT problem list). Pt needs skilled PT to maximize independence and safety to allow discharge to home with family support. Pt limited today by post op nausea and vomiting.      Follow Up Recommendations Follow surgeon's recommendation for DC plan and follow-up therapies;Supervision for mobility/OOB    Equipment Recommendations  Rolling walker with 5" wheels    Recommendations for Other Services       Precautions / Restrictions Precautions Precautions: Fall Restrictions Weight Bearing Restrictions: Yes RLE Weight Bearing: Weight bearing as tolerated      Mobility  Bed Mobility Overal bed mobility: Needs Assistance Bed Mobility: Supine to Sit;Sit to Supine     Supine to sit: Min assist;HOB elevated Sit to supine: Min assist   General bed mobility comments: Assist to move RLE off of bed and elevate trunk into sitting. Assist to bring RLE back up into bed    Transfers Overall transfer level: Needs assistance Equipment used: Rolling walker (2 wheeled) Transfers: Sit to/from Stand Sit to Stand: Min assist         General transfer comment: Assist to bring hips up. Verbal cues for hand placement  Ambulation/Gait Ambulation/Gait assistance: Min assist Gait Distance (Feet): 2 Feet Assistive device: Rolling walker (2 wheeled) Gait Pattern/deviations: Step-to pattern;Decreased step length - right;Decreased step length - left;Antalgic Gait velocity: decr Gait velocity interpretation: <1.31 ft/sec, indicative of household ambulator General Gait Details: Side stepped up side of bed toward HOB. Assist for  support.  Stairs            Wheelchair Mobility    Modified Rankin (Stroke Patients Only)       Balance Overall balance assessment: Mild deficits observed, not formally tested                                           Pertinent Vitals/Pain Pain Assessment: 0-10 Pain Score: 10-Worst pain ever Pain Location: rt hip Pain Descriptors / Indicators: Aching;Throbbing Pain Intervention(s): Limited activity within patient's tolerance;Premedicated before session;Repositioned;Ice applied    Home Living Family/patient expects to be discharged to:: Private residence Living Arrangements: Spouse/significant other Available Help at Discharge: Family;Available 24 hours/day Type of Home: House Home Access: Level entry     Home Layout: One level Home Equipment: None      Prior Function Level of Independence: Independent         Comments: Decreasing activity due to hip pain     Hand Dominance        Extremity/Trunk Assessment   Upper Extremity Assessment Upper Extremity Assessment: Overall WFL for tasks assessed    Lower Extremity Assessment Lower Extremity Assessment: RLE deficits/detail RLE Deficits / Details: limited by pain       Communication   Communication: No difficulties  Cognition Arousal/Alertness: Awake/alert;Lethargic;Suspect due to medications (sleepy due to IV pain meds) Behavior During Therapy: [facility] for tasks assessed/performed Overall Cognitive Status: Within Functional Limits for tasks assessed  General Comments      Exercises     Assessment/Plan    PT Assessment Patient needs continued PT services  PT Problem List Decreased strength;Decreased activity tolerance;Decreased mobility;Pain       PT Treatment Interventions DME instruction;Gait training;Functional mobility training;Therapeutic activities;Therapeutic exercise;Patient/family education    PT Goals  (Current goals can be found in the Care Plan section)  Acute Rehab PT Goals Patient Stated Goal: return home PT Goal Formulation: With patient/family Time For Goal Achievement: 01/13/21 Potential to Achieve Goals: Good    Frequency 7X/week   Barriers to discharge        Co-evaluation               AM-PAC PT "6 Clicks" Mobility  Outcome Measure Help needed turning from your back to your side while in a flat bed without using bedrails?: A Little Help needed moving from lying on your back to sitting on the side of a flat bed without using bedrails?: A Little Help needed moving to and from a bed to a chair (including a wheelchair)?: A Little Help needed standing up from a chair using your arms (e.g., wheelchair or bedside chair)?: A Little Help needed to walk in hospital room?: A Little Help needed climbing 3-5 steps with a railing? : A Lot 6 Click Score: 17    End of Session Equipment Utilized During Treatment: Gait belt Activity Tolerance: Other (comment) (limited by nausea) Patient left: in bed;with call bell/phone within reach;with chair alarm set;with family/visitor present Nurse Communication: Mobility status;Other (comment) (vomiting) PT Visit Diagnosis: Other abnormalities of gait and mobility (R26.89);Pain Pain - Right/Left: Right Pain - part of body: Hip    Time: 1717-1750 PT Time Calculation (min) (ACUTE ONLY): 33 min   Charges:   PT Evaluation $PT Eval Moderate Complexity: 1 Mod PT Treatments $Therapeutic Activity: 8-22 mins        [place] Pager [phone] Office [place] 01/06/2021, 6:08 PM

## 2021-01-06 NOTE — Anesthesia Procedure Notes (Signed)
Spinal  Patient location during procedure: OR Reason for block: surgical anesthesia Staffing Performed: anesthesiologist  Anesthesiologist: Sheree Lalla E, MD Preanesthetic Checklist Completed: patient identified, IV checked, risks and benefits discussed, surgical consent, monitors and equipment checked, pre-op evaluation and timeout performed Spinal Block Patient position: sitting Prep: DuraPrep and site prepped and draped Patient monitoring: continuous pulse ox, blood pressure and heart rate Approach: midline Location: L3-4 Injection technique: single-shot Needle Needle type: Pencan  Needle gauge: 24 G Needle length: 9 cm Assessment Events: CSF return Additional Notes Functioning IV was confirmed and monitors were applied. Sterile prep and drape, including hand hygiene and sterile gloves were used. The patient was positioned and the spine was prepped. The skin was anesthetized with lidocaine.  Free flow of clear CSF was obtained prior to injecting local anesthetic into the CSF. The needle was carefully withdrawn. The patient tolerated the procedure well.     

## 2021-01-06 NOTE — Anesthesia Procedure Notes (Signed)
Procedure Name: LMA Insertion Date/Time: 01/06/2021 12:41 PM Performed by: Oletta Lamas, CRNA Pre-anesthesia Checklist: Patient identified, Emergency Drugs available, Suction available and Patient being monitored Patient Re-evaluated:Patient Re-evaluated prior to induction Oxygen Delivery Method: Circle system utilized Preoxygenation: Pre-oxygenation with 100% oxygen Induction Type: IV induction Ventilation: Mask ventilation without difficulty LMA: LMA inserted LMA Size: 4.0 Number of attempts: 1 Placement Confirmation: positive ETCO2 Tube secured with: Tape Dental Injury: Teeth and Oropharynx as per pre-operative assessment

## 2021-01-06 NOTE — H&P (Signed)
TOTAL HIP ADMISSION H&P  Patient is admitted for right total hip arthroplasty.  Subjective:  Chief Complaint: right hip pain  HPI: Mikayla Jenkins, 75 y.o. female, has a history of pain and functional disability in the right hip(s) due to arthritis and patient has failed non-surgical conservative treatments for greater than 12 weeks to include NSAID's and/or analgesics, corticosteriod injections, flexibility and strengthening excercises, use of assistive devices, weight reduction as appropriate and activity modification.  Onset of symptoms was gradual starting 5 years ago with gradually worsening course since that time.The patient noted no past surgery on the right hip(s).  Patient currently rates pain in the right hip at 10 out of 10 with activity. Patient has night pain, worsening of pain with activity and weight bearing, trendelenberg gait, pain that interfers with activities of daily living and pain with passive range of motion. Patient has evidence of subchondral sclerosis, periarticular osteophytes, joint space narrowing and protrusio by imaging studies. This condition presents safety issues increasing the risk of falls.  There is no current active infection.  Patient Active Problem List   Diagnosis Date Noted  . Osteoarthritis of right hip 08/18/2020  . Anemia in chronic kidney disease 10/09/2019  . Stage 3b chronic kidney disease ([place]) 10/03/2019  . Female bladder prolapse 06/30/2015  . Lipoma of abdominal wall 01/24/2013  . Pain in back 05/15/2012  . Low back pain with right-sided sciatica 04/26/2012  . ALLERGIC RHINITIS, SEASONAL 04/13/2009  . Type 2 diabetes mellitus with nephropathy ([place]) 01/10/2008  . Hyperlipidemia LDL goal <100 01/10/2008  . Malignant hypertension 01/10/2008   Past Medical History:  Diagnosis Date  . Diabetes mellitus type II   . Hyperlipidemia   . Hypertension   . Renal disorder     Past Surgical History:  Procedure Laterality Date  . ABDOMINAL  HYSTERECTOMY    . COLONOSCOPY N/A 06/13/2015   Procedure: COLONOSCOPY;  Surgeon: Rogene Houston, MD;  Location: [facility];  Service: Endoscopy;  Laterality: N/A;  930-rescheduled 8/26 @ 8:30am Ann to notify pt  . HERNIA REPAIR    . inguinal herniorrhapy right    . right eye surgery secondary to right eye weakness    . SALPINGOOPHORECTOMY    . TUBAL LIGATION      Current Facility-Administered Medications  Medication Dose Route Frequency Provider Last Rate Last Admin  . ceFAZolin (ANCEF) IVPB 2g/100 mL premix  2 g Intravenous On Call to OR Pete Pelt, PA-C      . lactated ringers infusion   Intravenous Continuous Lidia Collum, MD 10 mL/hr at 01/06/21 1053 Continued from Pre-op at 01/06/21 1053  . tranexamic acid (CYKLOKAPRON) IVPB 1,000 mg  1,000 mg Intravenous To OR Mcarthur Rossetti, MD       Allergies  Allergen Reactions  . Metformin And Related Other (See Comments)    CKD stage 4    Social History   Tobacco Use  . Smoking status: Never Smoker  . Smokeless tobacco: Never Used  Substance Use Topics  . Alcohol use: No    Family History  Problem Relation Age of Onset  . Pneumonia Brother        on continuous O2  . Stroke Brother   . Stroke Sister   . COPD Sister   . Diabetes Sister   . Stroke Sister   . Hypertension Mother   . Diabetes Mother   . Fibroids Daughter        uterine  Review of Systems  Musculoskeletal: Positive for back pain and gait problem.  All other systems reviewed and are negative.   Objective:  Physical Exam Vitals reviewed.  Constitutional:      Appearance: Normal appearance.  HENT:     Head: Normocephalic and atraumatic.  Eyes:     Extraocular Movements: Extraocular movements intact.     Pupils: Pupils are equal, round, and reactive to light.  Cardiovascular:     Rate and Rhythm: Normal rate and regular rhythm.     Pulses: Normal pulses.  Pulmonary:     Effort: Pulmonary effort is normal.     Breath sounds:  Normal breath sounds.  Abdominal:     Palpations: Abdomen is soft.  Musculoskeletal:     Cervical back: Normal range of motion and neck supple.     Right hip: Tenderness and bony tenderness present. Decreased range of motion. Decreased strength.  Neurological:     Mental Status: She is alert and oriented to person, place, and time.  Psychiatric:        Behavior: Behavior normal.     Vital signs in last 24 hours: Temp:  [98.2 F (36.8 C)] 98.2 F (36.8 C) (03/22 1015) Pulse Rate:  [59] 59 (03/22 1015) Resp:  [18] 18 (03/22 1015) BP: (149)/(59) 149/59 (03/22 1015) SpO2:  [100 %] 100 % (03/22 1015) Weight:  [69.1 kg] 69.1 kg (03/22 1015)  Labs:   Estimated body mass index is 23.87 kg/m as calculated from the following:   Height as of this encounter: 5\' 7"  (1.702 m).   Weight as of this encounter: 69.1 kg.   Imaging Review Plain radiographs demonstrate severe degenerative joint disease of the right hip(s). There is protrusio.  The bone quality appears to be good for age and reported activity level.      Assessment/Plan:  End stage arthritis, right hip(s)  The patient history, physical examination, clinical judgement of the provider and imaging studies are consistent with end stage degenerative joint disease of the right hip(s) and total hip arthroplasty is deemed medically necessary. The treatment options including medical management, injection therapy, arthroscopy and arthroplasty were discussed at length. The risks and benefits of total hip arthroplasty were presented and reviewed. The risks due to aseptic loosening, infection, stiffness, dislocation/subluxation,  thromboembolic complications and other imponderables were discussed.  The patient acknowledged the explanation, agreed to proceed with the plan and consent was signed. Patient is being admitted for inpatient treatment for surgery, pain control, PT, OT, prophylactic antibiotics, VTE prophylaxis, progressive  ambulation and ADL's and discharge planning.The patient is planning to be discharged home with home health services

## 2021-01-06 NOTE — Anesthesia Preprocedure Evaluation (Signed)
Anesthesia Evaluation  Patient identified by MRN, date of birth, ID band Patient awake    Reviewed: Allergy & Precautions, NPO status , Patient's Chart, lab work & pertinent test results, reviewed documented beta blocker date and time   History of Anesthesia Complications Negative for: history of anesthetic complications  Airway Mallampati: II  TM Distance: >3 FB Neck ROM: Full    Dental  (+) Upper Dentures, Partial Lower   Pulmonary neg pulmonary ROS,    Pulmonary exam normal        Cardiovascular hypertension, Pt. on medications and Pt. on home beta blockers Normal cardiovascular exam     Neuro/Psych negative neurological ROS     GI/Hepatic negative GI ROS, Neg liver ROS,   Endo/Other  diabetes, Type 2, Oral Hypoglycemic Agents  Renal/GU Renal InsufficiencyRenal disease (CKD; Cr 1.28)  negative genitourinary   Musculoskeletal negative musculoskeletal ROS (+)   Abdominal   Peds  Hematology negative hematology ROS (+)   Anesthesia Other Findings   Reproductive/Obstetrics                           Anesthesia Physical Anesthesia Plan  ASA: II  Anesthesia Plan: Spinal   Post-op Pain Management:    Induction:   PONV Risk Score and Plan: 2 and Propofol infusion, Treatment may vary due to age or medical condition, Ondansetron and TIVA  Airway Management Planned: Nasal Cannula and Simple Face Mask  Additional Equipment: None  Intra-op Plan:   Post-operative Plan:   Informed Consent: I have reviewed the patients History and Physical, chart, labs and discussed the procedure including the risks, benefits and alternatives for the proposed anesthesia with the patient or authorized representative who has indicated his/her understanding and acceptance.       Plan Discussed with:   Anesthesia Plan Comments:         Anesthesia Quick Evaluation

## 2021-01-06 NOTE — Anesthesia Postprocedure Evaluation (Signed)
Anesthesia Post Note  Patient: Earlyne Iba  Procedure(s) Performed: RIGHT TOTAL HIP ARTHROPLASTY ANTERIOR APPROACH (Right Hip)     Patient location during evaluation: PACU Anesthesia Type: Spinal and General Level of consciousness: awake and alert Pain management: pain level controlled Vital Signs Assessment: post-procedure vital signs reviewed and stable Respiratory status: spontaneous breathing, nonlabored ventilation, respiratory function stable and patient connected to nasal cannula oxygen Cardiovascular status: blood pressure returned to baseline and stable Postop Assessment: no apparent nausea or vomiting Anesthetic complications: no   No complications documented.  Last Vitals:  Vitals:   01/06/21 1537 01/06/21 1552  BP: (!) 137/59 (!) 149/64  Pulse: (!) 52 (!) 55  Resp: 18 12  Temp:  36.8 C  SpO2: 100% 100%    Last Pain:  Vitals:   01/06/21 1454  TempSrc:   PainSc: 8                  Devine Dant E Alyzabeth Pontillo

## 2021-01-07 ENCOUNTER — Encounter (HOSPITAL_COMMUNITY): Payer: Self-pay | Admitting: Orthopaedic Surgery

## 2021-01-07 DIAGNOSIS — Z888 Allergy status to other drugs, medicaments and biological substances status: Secondary | ICD-10-CM | POA: Diagnosis not present

## 2021-01-07 DIAGNOSIS — Z7982 Long term (current) use of aspirin: Secondary | ICD-10-CM | POA: Diagnosis not present

## 2021-01-07 DIAGNOSIS — I129 Hypertensive chronic kidney disease with stage 1 through stage 4 chronic kidney disease, or unspecified chronic kidney disease: Secondary | ICD-10-CM | POA: Diagnosis not present

## 2021-01-07 DIAGNOSIS — N1832 Chronic kidney disease, stage 3b: Secondary | ICD-10-CM | POA: Diagnosis not present

## 2021-01-07 DIAGNOSIS — E1122 Type 2 diabetes mellitus with diabetic chronic kidney disease: Secondary | ICD-10-CM | POA: Diagnosis not present

## 2021-01-07 DIAGNOSIS — Z7984 Long term (current) use of oral hypoglycemic drugs: Secondary | ICD-10-CM | POA: Diagnosis not present

## 2021-01-07 DIAGNOSIS — M1611 Unilateral primary osteoarthritis, right hip: Secondary | ICD-10-CM | POA: Diagnosis not present

## 2021-01-07 DIAGNOSIS — Z79899 Other long term (current) drug therapy: Secondary | ICD-10-CM | POA: Diagnosis not present

## 2021-01-07 DIAGNOSIS — M247 Protrusio acetabuli: Secondary | ICD-10-CM | POA: Diagnosis not present

## 2021-01-07 LAB — CBC
HCT: 26.2 % — ABNORMAL LOW (ref 36.0–46.0)
Hemoglobin: 9.3 g/dL — ABNORMAL LOW (ref 12.0–15.0)
MCH: 32.3 pg (ref 26.0–34.0)
MCHC: 35.5 g/dL (ref 30.0–36.0)
MCV: 91 fL (ref 80.0–100.0)
Platelets: 191 10*3/uL (ref 150–400)
RBC: 2.88 MIL/uL — ABNORMAL LOW (ref 3.87–5.11)
RDW: 12.3 % (ref 11.5–15.5)
WBC: 7.1 10*3/uL (ref 4.0–10.5)
nRBC: 0 % (ref 0.0–0.2)

## 2021-01-07 LAB — BASIC METABOLIC PANEL
Anion gap: 9 (ref 5–15)
BUN: 10 mg/dL (ref 8–23)
CO2: 26 mmol/L (ref 22–32)
Calcium: 8.8 mg/dL — ABNORMAL LOW (ref 8.9–10.3)
Chloride: 97 mmol/L — ABNORMAL LOW (ref 98–111)
Creatinine, Ser: 1.13 mg/dL — ABNORMAL HIGH (ref 0.44–1.00)
GFR, Estimated: 51 mL/min — ABNORMAL LOW (ref >60)
Glucose, Bld: 199 mg/dL — ABNORMAL HIGH (ref 70–99)
Potassium: 3.5 mmol/L (ref 3.5–5.1)
Sodium: 132 mmol/L — ABNORMAL LOW (ref 135–145)

## 2021-01-07 LAB — GLUCOSE, CAPILLARY
Glucose-Capillary: 168 mg/dL — ABNORMAL HIGH (ref 70–99)
Glucose-Capillary: 209 mg/dL — ABNORMAL HIGH (ref 70–99)
Glucose-Capillary: 170 mg/dL — ABNORMAL HIGH (ref 70–99)
Glucose-Capillary: 180 mg/dL — ABNORMAL HIGH (ref 70–99)

## 2021-01-07 NOTE — Progress Notes (Signed)
Physical Therapy Treatment Patient Details Name: Mikayla Jenkins MRN: [id number] DOB: Jul 09, 1946 Today's Date: 01/07/2021    History of Present Illness Pt adm 3/22 for rt THR direct anterior approach. PMH - DM, HTN, arthritis    PT Comments    Pt supine in bed on arrival this session and eager to participate in PT session.  Pt making great functional gains.  Positioned in recliner to discourage IR as it is a bit excessive.  Pt continues to benefit from skilled rehab in acute setting and will f/u this pm.  Informed nursing and TOC of need for 3:1 commode at d/c as she has increased difficulty from commode height.     Follow Up Recommendations  Follow surgeon's recommendation for DC plan and follow-up therapies;Supervision for mobility/OOB     Equipment Recommendations  Rolling walker with 5" wheels;3in1 (PT)    Recommendations for Other Services       Precautions / Restrictions Precautions Precautions: Fall Restrictions Weight Bearing Restrictions: Yes RLE Weight Bearing: Weight bearing as tolerated    Mobility  Bed Mobility Overal bed mobility: Needs Assistance Bed Mobility: Supine to Sit;Sit to Supine     Supine to sit: Min assist;HOB elevated;Mod assist     General bed mobility comments: Cues for sequencing and safety.  Pt required min assistance to advance RLE, utilized gt belt to advance leg completely.  Increased assistance to rise from comfort height toilet, requiring mod A.    Transfers Overall transfer level: Needs assistance Equipment used: Rolling walker (2 wheeled) Transfers: Sit to/from Stand           General transfer comment: Cues for hand placement and forward weight shifting.  Cues for RLE advancement this session.  Ambulation/Gait Ambulation/Gait assistance: Min guard Gait Distance (Feet): 80 Feet Assistive device: Rolling walker (2 wheeled) Gait Pattern/deviations: Step-to pattern;Decreased step length - right;Decreased step length -  left;Antalgic;Step-through pattern Gait velocity: decr   General Gait Details: Pt able to progress to step through pattern.  Poor safety this session and cues required to keep RW close to body.   Stairs             Wheelchair Mobility    Modified Rankin (Stroke Patients Only)       Balance Overall balance assessment: Mild deficits observed, not formally tested                                          Cognition Arousal/Alertness: Awake/alert;Lethargic;Suspect due to medications Behavior During Therapy: [facility] for tasks assessed/performed Overall Cognitive Status: Within Functional Limits for tasks assessed                                        Exercises General Exercises - Lower Extremity Ankle Circles/Pumps: AROM;Both;Supine;20 reps Quad Sets: AROM;Right;10 reps;Supine Heel Slides: AAROM;Left;10 reps;Supine Hip ABduction/ADduction: AAROM;Left;10 reps;Supine Straight Leg Raises: AAROM;Left;10 reps;Supine    General Comments        Pertinent Vitals/Pain Pain Assessment: 0-10 Pain Score: 4  Pain Location: rt hip Pain Descriptors / Indicators: Aching;Throbbing Pain Intervention(s): Monitored during session;Repositioned    Home Living Family/patient expects to be discharged to:: Private residence Living Arrangements: Spouse/significant other                  Prior Function  PT Goals (current goals can now be found in the care plan section) Acute Rehab PT Goals Patient Stated Goal: return home Potential to Achieve Goals: Good Progress towards PT goals: Progressing toward goals    Frequency    7X/week      PT Plan Discharge plan needs to be updated    Co-evaluation              AM-PAC PT "6 Clicks" Mobility   Outcome Measure  Help needed turning from your back to your side while in a flat bed without using bedrails?: A Little Help needed moving from lying on your back to sitting on the  side of a flat bed without using bedrails?: A Little Help needed moving to and from a bed to a chair (including a wheelchair)?: A Little Help needed standing up from a chair using your arms (e.g., wheelchair or bedside chair)?: A Little Help needed to walk in hospital room?: A Little Help needed climbing 3-5 steps with a railing? : A Lot 6 Click Score: 17    End of Session Equipment Utilized During Treatment: Gait belt Activity Tolerance: Patient tolerated treatment well Patient left: in chair;with call bell/phone within reach;with chair alarm set Nurse Communication: Mobility status (will need BSC for d/c.) PT Visit Diagnosis: Other abnormalities of gait and mobility (R26.89);Pain Pain - Right/Left: Right Pain - part of body: Hip     Time: 4193-7902 PT Time Calculation (min) (ACUTE ONLY): 42 min  Charges:  $Gait Training: 8-22 mins $Therapeutic Exercise: 8-22 mins $Therapeutic Activity: 8-22 mins                     Mikayla Jenkins , PTA Acute Rehabilitation Services Pager [phone] Office [phone]     Jaythan Hinely Eli Hose 01/07/2021, 11:14 AM

## 2021-01-07 NOTE — Plan of Care (Signed)
  Problem: Education: Goal: Required Educational Video(s) Outcome: Progressing   Problem: Clinical Measurements: Goal: Postoperative complications will be avoided or minimized Outcome: Progressing   Problem: Skin Integrity: Goal: Demonstration of wound healing without infection will improve Outcome: Progressing   

## 2021-01-07 NOTE — Plan of Care (Signed)

## 2021-01-07 NOTE — TOC Initial Note (Signed)
Transition of Care [facility]) - Initial/Assessment Note    Patient Details  Name: Mikayla Jenkins MRN: [id number] Date of Birth: 11-26-1945  Transition of [facility]) CM/SW Contact:    Sherrilyn Rist Transition of Care Supervisor Phone Number: [phone] 01/07/2021, 10:24 AM  Clinical Narrative:    Patient lives at home with spouse. PCP is Dr Tula Nakayama. 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    DME Arranged:  Walker rolling (Possibly a 3in1/BSC; patient declined, but wants to see how she does with therapy) DME Agency:  [facility]  HH Arranged:  PT [place] Agency:  [place] (now [facility])  Additional Comments: Please contact me with any questions of if this plan should need to change.     Expected Discharge Plan: [place] Barriers to Discharge: No Barriers Identified   Patient Goals and CMS Choice   CMS Medicare.gov Compare Post Acute Care list provided to:: Patient    Expected Discharge Plan and Services Expected Discharge Plan: [place]                         DME Arranged: Walker rolling (Possibly a 3in1/BSC; patient declined, but wants to see how she does with therapy) DME Agency: [facility]       HH Arranged: PT [place] Agency: [facility] (now [facility])        Prior Living Arrangements/Services                       Activities of Daily Living Home Assistive Devices/Equipment: Dentures (specify type) ADL Screening (condition at time of admission) Patient's cognitive ability adequate to safely complete daily activities?: No Is the patient deaf or have difficulty hearing?: No Does the patient have difficulty seeing, even when wearing glasses/contacts?: No Does the patient have difficulty concentrating, remembering, or making decisions?: Yes Patient able to express need for assistance with ADLs?: Yes Does the patient  have difficulty dressing or bathing?: No Independently performs ADLs?: Yes (appropriate for developmental age) Does the patient have difficulty walking or climbing stairs?: Yes Weakness of Legs: Right Weakness of Arms/Hands: None  Permission Sought/Granted                  Emotional Assessment              Admission diagnosis:  Status post total replacement of right hip [Z96.641] Patient Active Problem List   Diagnosis Date Noted  . Status post total replacement of right hip 01/06/2021  . Osteoarthritis of right hip 08/18/2020  . Anemia in chronic kidney disease 10/09/2019  . Stage 3b chronic kidney disease ([place]) 10/03/2019  . Female bladder prolapse 06/30/2015  . Lipoma of abdominal wall 01/24/2013  . Pain in back 05/15/2012  . Low back pain with right-sided sciatica 04/26/2012  . ALLERGIC RHINITIS, SEASONAL 04/13/2009  . Type 2 diabetes mellitus with nephropathy ([place]) 01/10/2008  . Hyperlipidemia LDL goal <100 01/10/2008  . Malignant hypertension 01/10/2008   PCP:  Fayrene Helper, MD Pharmacy:   [place], [place] [address] [place] [address] [place] [address] Phone: [phone] Fax: [phone]  [facility] [place], [place] [address] Phone: [phone] Fax: [phone]     Social Determinants of Health (  SDOH) Interventions    Readmission Risk Interventions No flowsheet data found.

## 2021-01-07 NOTE — Op Note (Signed)
NAMESYMPHONI, HELBLING MEDICAL RECORD NO: [id number] ACCOUNT NO: [id number] DATE OF BIRTH: Oct 18, 1946 FACILITY: [facility] LOCATION: [facility] PHYSICIAN: Lind Guest. 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  PROCEDURE:  Right total hip arthroplasty, direct anterior approach.  IMPLANTS:  DePuy sector Gription acetabular component, size 52 with a single screw, size 36+0 neutral polyethylene liner, size 10 Corail femoral component with standard offset.  SURGEON:  Jean Rosenthal, M.D.   ASSISTANT:  Erskine Emery, PA-C.  ANESTHESIA:  1.  Spinal. 2.  General.  ANTIBIOTICS:  2 g IV Ancef.  ESTIMATED BLOOD LOSS:  852 mL  COMPLICATIONS:  None.  INDICATIONS:  The patient is a very pleasant 75 year old diabetic female with debilitating arthritis involving her right hip as well as protrusio seen on x-ray.  She ambulates with a rolling walker and her pain is daily and it is severe.  At this point,  her right hip pain is detrimentally affecting her mobility, her quality of life and activities of daily living.  She does wish to proceed with total hip arthroplasty.  Hemoglobin A1c is 7.4.  She understands there is a high risk for infection due to her  diabetic control.  There is also risk of acute blood loss anemia, nerve or vessel injury, fracture, infection, dislocation, DVT, implant failure and skin and soft tissue issues.  She understands our goals are to decrease pain, improve mobility and  overall improve quality of life.  DESCRIPTION OF PROCEDURE:  After informed consent was obtained, appropriate right hip was marked.  She was brought to the operating room and sat up on a stretcher where spinal anesthesia was obtained, a Foley catheter was placed.  We assessed  her leg  lengths and found her significantly shorter on the right side due to her protrusio disease compared to the left side.  Traction boots were placed on both her feet and she was placed supine on the Hana fracture table with a perineal post in place and both  legs in line skeletal traction devices, but no traction applied.  Her right operative hip was prepped and draped with DuraPrep and sterile drapes.  A timeout was called and she was identified correct patient, correct right hip.  We then pinched her skin  to make sure that she had adequate anesthesia and she did seem to respond to this with stimuli.  General anesthesia was then obtained via an LMA.  We then were able to proceed with our surgery.  We made an incision just inferior and posterior to the  anterior superior iliac spine and carried this obliquely down the leg.  We dissected down tensor fascia lata muscle.  Tensor fascia was then divided longitudinally to proceed with direct anterior approach to the hip.  We identified and cauterized  circumflex vessels, identified the hip capsule, opened the hip capsule in L-type format finding a very large joint effusion and significant protrusio of the femoral head and neck. We removed the labrum given the fact that the resection protrusio to be  able to get to the femoral head and neck.  We then made our femoral neck cut with oscillating saw, proximal to the lesser trochanter and completed this with an osteotome.  We placed a corkscrew guide in the femoral head and removed the femoral head in  its entirety  and found a wide area of loss of cartilage as well as evidence of osteonecrosis.  We then removed remnants of the acetabular labrum and other debris and placed a bent Hohmann over the medial acetabular rim.  We then began reaming under  direct visualization quite carefully and under direct fluoroscopy from a size 44 reamer in a stepwise increments up to a size 51 with all reamers placed under direct  visualization.  Last reamer placed under direct fluoroscopy, so we could obtain our  depth of reaming, our inclination and anteversion.  We knew we were going from orbital rim based on her protrusio.  We placed a real DePuy sector Gription acetabular component size 52 under direct visualization and fluoroscopy.  We were pleased with the  placement of this and had a nice tight fit.  I still placed a 25 mm length screw in acetabulum.  I then placed a 36+4 neutral polyethylene liner.  Attention was then turned to the femur with the leg externally rotated to 120 degrees and extended and  adducted. We then placed a Mueller retractor medially and Hohman retractor behind the greater trochanter, we released lateral joint capsule and used a box cutting osteotome to enter the femoral canal and a rongeur to lateralize, then began broaching  using the carotid broaching system from a size 8 going up to a size 10.  With a size 10 having a nice tight feeling a little bit proud, we trialed a 36-2 hip ball based on changing her anatomy.  We reduced this and acetabulum, it was very tight and  stable on exam.  We definitely lengthened her.  We were pleased with assessment of this radiographically and mechanically in terms of stability.  We then dislocated the hip, removed the trial components.  We placed the real Corail femoral component, size  10 with standard offset and the real 36 -2 metal hip ball and again reduced this in acetabulum, it was stable.  We verified this radiographically and clinically.  We then irrigated the soft tissue with normal saline solution using pulsatile lavage.   There was no joint capsule to close, so the tensor fascia was closed with #1 Vicryl, followed by 0 Vicryl to close the deep tissue and 2-0 Vicryl to close subcutaneous tissue.  The skin was reapproximated with staples.  An Aquacel dressing was applied.   She was taken off the Hana table, awakened, extubated, and taken to recovery room in  stable condition with all final counts being correct.  No complications noted.  Of note, Benita Stabile, PA-C, assisted in the entire case from the beginning to the end and  his assistance was crucial to facilitate all aspects of this case.   SUJ D: 01/06/2021 1:36:28 pm T: 01/07/2021 2:54:00 am  JOB: [id number]/ [id number]

## 2021-01-07 NOTE — Progress Notes (Signed)
Physical Therapy Treatment Patient Details Name: Mikayla Jenkins MRN: [id number] DOB: 1946-05-24 Today's Date: 01/07/2021    History of Present Illness Pt adm 3/22 for rt THR direct anterior approach. PMH - DM, HTN, arthritis    PT Comments    Pt supine in bed on arrival this session.  Pt pleasant and agreeable to session.  During gt training reports mild dizziness that she attributes to her pain medicine.  Plan for orthostatic vitals in am and progression to stair training.  Pt is progressing well.  Informed nursing of dizziness.     Follow Up Recommendations  Follow surgeon's recommendation for DC plan and follow-up therapies;Supervision for mobility/OOB     Equipment Recommendations  Rolling walker with 5" wheels;3in1 (PT)    Recommendations for Other Services       Precautions / Restrictions Precautions Precautions: Fall Restrictions Weight Bearing Restrictions: Yes RLE Weight Bearing: Weight bearing as tolerated    Mobility  Bed Mobility Overal bed mobility: Needs Assistance Bed Mobility: Supine to Sit;Sit to Supine     Supine to sit: Min assist Sit to supine: Min assist   General bed mobility comments: Min assistance to move RLE OOB and use of gt belt strap to move back into bed against gravity.  Increased time due to pain.    Transfers Overall transfer level: Needs assistance Equipment used: Rolling walker (2 wheeled) Transfers: Sit to/from Stand Sit to Stand: Min guard         General transfer comment: Cues for hand placement and forward weight shifting.  Cues for RLE advancement this session.  Ambulation/Gait Ambulation/Gait assistance: Min guard Gait Distance (Feet): 120 Feet Assistive device: Rolling walker (2 wheeled) Gait Pattern/deviations: Step-to pattern;Decreased step length - right;Decreased step length - left;Antalgic;Step-through pattern Gait velocity: decr   General Gait Details: Pt able to progress to step through pattern.  Poor safety  this session and cues required to keep RW close to body.   Stairs             Wheelchair Mobility    Modified Rankin (Stroke Patients Only)       Balance Overall balance assessment: Mild deficits observed, not formally tested                                          Cognition Arousal/Alertness: Awake/alert Behavior During Therapy: WFL for tasks assessed/performed Overall Cognitive Status: Within Functional Limits for tasks assessed                                 General Comments: reports mild dizziness during acitivty and states," I think it's the medicine.      Exercises General Exercises - Lower Extremity Ankle Circles/Pumps: AROM;Both;Supine;20 reps Quad Sets: AROM;Right;10 reps;Supine Heel Slides: AAROM;Left;10 reps;Supine Hip ABduction/ADduction: AAROM;Left;10 reps;Supine Straight Leg Raises: AAROM;Left;10 reps;Supine    General Comments        Pertinent Vitals/Pain Pain Assessment: 0-10 Pain Score: 6  Pain Location: rt hip Pain Descriptors / Indicators: Aching;Throbbing Pain Intervention(s): Monitored during session;Repositioned    Home Living                      Prior Function            PT Goals (current goals can now be found in the care plan section)  Acute Rehab PT Goals Patient Stated Goal: return home Potential to Achieve Goals: Good Progress towards PT goals: Progressing toward goals    Frequency    7X/week      PT Plan Current plan remains appropriate    Co-evaluation              AM-PAC PT "6 Clicks" Mobility   Outcome Measure  Help needed turning from your back to your side while in a flat bed without using bedrails?: A Little Help needed moving from lying on your back to sitting on the side of a flat bed without using bedrails?: A Little Help needed moving to and from a bed to a chair (including a wheelchair)?: A Little Help needed standing up from a chair using your arms  (e.g., wheelchair or bedside chair)?: A Little Help needed to walk in hospital room?: A Little Help needed climbing 3-5 steps with a railing? : A Lot 6 Click Score: 17    End of Session Equipment Utilized During Treatment: Gait belt Activity Tolerance: Patient tolerated treatment well Patient left: in chair;with call bell/phone within reach;with chair alarm set Nurse Communication: Mobility status (will need BSC for d/c.) PT Visit Diagnosis: Other abnormalities of gait and mobility (R26.89);Pain Pain - Right/Left: Right Pain - part of body: Hip     Time: 2993-7169 PT Time Calculation (min) (ACUTE ONLY): 26 min  Charges:  $Gait Training: 8-22 mins $Therapeutic Exercise: 8-22 mins                     [name] , PTA Acute Rehabilitation Services Pager [phone] Office [phone]     Evelina Lore Eli Hose 01/07/2021, 3:45 PM

## 2021-01-07 NOTE — Progress Notes (Signed)
Subjective: 1 Day Post-Op Procedure(s) (LRB): RIGHT TOTAL HIP ARTHROPLASTY ANTERIOR APPROACH (Right) Patient reports pain as moderate.  Mild acute blood loss anemia from surgery, but tolerating well.  Objective: Vital signs in last 24 hours: Temp:  [97.5 F (36.4 C)-98.8 F (37.1 C)] 97.8 F (36.6 C) (03/23 0440) Pulse Rate:  [47-74] 62 (03/23 0440) Resp:  [5-18] 17 (03/23 0440) BP: (91-178)/(48-70) 129/58 (03/23 0440) SpO2:  [89 %-100 %] 100 % (03/23 0440) Weight:  [69.1 kg] 69.1 kg (03/22 1015)  Intake/Output from previous day: 03/22 0701 - 03/23 0700 In: 215 [P.O.:15; IV Piggyback:200] Out: 2825 [Urine:2525; Blood:300] Intake/Output this shift: No intake/output data recorded.  Recent Labs    01/07/21 0345  HGB 9.3*   Recent Labs    01/07/21 0345  WBC 7.1  RBC 2.88*  HCT 26.2*  PLT 191   Recent Labs    01/07/21 0345  NA 132*  K 3.5  CL 97*  CO2 26  BUN 10  CREATININE 1.13*  GLUCOSE 199*  CALCIUM 8.8*   No results for input(s): LABPT, INR in the last 72 hours.  Sensation intact distally Intact pulses distally Dorsiflexion/Plantar flexion intact Incision: dressing C/D/I   Assessment/Plan: 1 Day Post-Op Procedure(s) (LRB): RIGHT TOTAL HIP ARTHROPLASTY ANTERIOR APPROACH (Right) Up with therapy Discharge home with home health when cleared by therapy and stable.      Mcarthur Rossetti 01/07/2021, 7:43 AM

## 2021-01-07 NOTE — Plan of Care (Signed)
  Problem: Education: Goal: Required Educational Video(s) Outcome: Progressing   Problem: Clinical Measurements: Goal: Postoperative complications will be avoided or minimized Outcome: Progressing   Problem: Skin Integrity: Goal: Demonstration of wound healing without infection will improve Outcome: Progressing   Problem: Activity: Goal: Risk for activity intolerance will decrease Outcome: Progressing   Problem: Coping: Goal: Level of anxiety will decrease Outcome: Progressing   Problem: Pain Managment: Goal: General experience of comfort will improve Outcome: Progressing

## 2021-01-07 NOTE — Discharge Instructions (Signed)

## 2021-01-08 DIAGNOSIS — M247 Protrusio acetabuli: Secondary | ICD-10-CM | POA: Diagnosis not present

## 2021-01-08 DIAGNOSIS — M1611 Unilateral primary osteoarthritis, right hip: Secondary | ICD-10-CM | POA: Diagnosis not present

## 2021-01-08 DIAGNOSIS — E1122 Type 2 diabetes mellitus with diabetic chronic kidney disease: Secondary | ICD-10-CM | POA: Diagnosis not present

## 2021-01-08 DIAGNOSIS — Z888 Allergy status to other drugs, medicaments and biological substances status: Secondary | ICD-10-CM | POA: Diagnosis not present

## 2021-01-08 DIAGNOSIS — Z7982 Long term (current) use of aspirin: Secondary | ICD-10-CM | POA: Diagnosis not present

## 2021-01-08 DIAGNOSIS — I129 Hypertensive chronic kidney disease with stage 1 through stage 4 chronic kidney disease, or unspecified chronic kidney disease: Secondary | ICD-10-CM | POA: Diagnosis not present

## 2021-01-08 DIAGNOSIS — Z7984 Long term (current) use of oral hypoglycemic drugs: Secondary | ICD-10-CM | POA: Diagnosis not present

## 2021-01-08 DIAGNOSIS — Z79899 Other long term (current) drug therapy: Secondary | ICD-10-CM | POA: Diagnosis not present

## 2021-01-08 DIAGNOSIS — N1832 Chronic kidney disease, stage 3b: Secondary | ICD-10-CM | POA: Diagnosis not present

## 2021-01-08 LAB — CBC
HCT: 24.5 % — ABNORMAL LOW (ref 36.0–46.0)
Hemoglobin: 8.5 g/dL — ABNORMAL LOW (ref 12.0–15.0)
MCH: 31.8 pg (ref 26.0–34.0)
MCHC: 34.7 g/dL (ref 30.0–36.0)
MCV: 91.8 fL (ref 80.0–100.0)
Platelets: 169 10*3/uL (ref 150–400)
RBC: 2.67 MIL/uL — ABNORMAL LOW (ref 3.87–5.11)
RDW: 12.4 % (ref 11.5–15.5)
WBC: 8.1 10*3/uL (ref 4.0–10.5)
nRBC: 0 % (ref 0.0–0.2)

## 2021-01-08 LAB — GLUCOSE, CAPILLARY
Glucose-Capillary: 190 mg/dL — ABNORMAL HIGH (ref 70–99)
Glucose-Capillary: 196 mg/dL — ABNORMAL HIGH (ref 70–99)
Glucose-Capillary: 170 mg/dL — ABNORMAL HIGH (ref 70–99)

## 2021-01-08 MED ORDER — ASPIRIN 81 MG PO CHEW: 81.0000 mg | CHEWABLE_TABLET | Freq: Two times a day (BID) | ORAL | 0 refills | Status: DC

## 2021-01-08 MED ORDER — OXYCODONE HCL 5 MG PO TABS: 5.0000 mg | ORAL_TABLET | Freq: Four times a day (QID) | ORAL | 0 refills | Status: DC | PRN

## 2021-01-08 MED ORDER — METHOCARBAMOL 500 MG PO TABS: 500.0000 mg | ORAL_TABLET | Freq: Four times a day (QID) | ORAL | 1 refills | Status: DC | PRN

## 2021-01-08 NOTE — Progress Notes (Signed)
Pt was given her AVS discharge summary and went over with her. Pt had no further questions. Equipment is in room to take home with her. IV was removed with catheter intact.

## 2021-01-08 NOTE — Progress Notes (Signed)
Subjective: 2 Days Post-Op Procedure(s) (LRB): RIGHT TOTAL HIP ARTHROPLASTY ANTERIOR APPROACH (Right) Patient reports pain as moderate.  Working slowly with therapy, but doing well overall.  Asymptomatic acute blood loss anemia.  Objective: Vital signs in last 24 hours: Temp:  [97.9 F (36.6 C)-100 F (37.8 C)] 98.9 F (37.2 C) (03/24 0803) Pulse Rate:  [59-70] 63 (03/24 0803) Resp:  [15-20] 18 (03/24 0803) BP: (103-145)/(46-64) 121/60 (03/24 0803) SpO2:  [96 %-100 %] 100 % (03/24 0803)  Intake/Output from previous day: 03/23 0701 - 03/24 0700 In: 240 [P.O.:240] Out: -  Intake/Output this shift: No intake/output data recorded.  Recent Labs    01/07/21 0345 01/08/21 0345  HGB 9.3* 8.5*   Recent Labs    01/07/21 0345 01/08/21 0345  WBC 7.1 8.1  RBC 2.88* 2.67*  HCT 26.2* 24.5*  PLT 191 169   Recent Labs    01/07/21 0345  NA 132*  K 3.5  CL 97*  CO2 26  BUN 10  CREATININE 1.13*  GLUCOSE 199*  CALCIUM 8.8*   No results for input(s): LABPT, INR in the last 72 hours.  Sensation intact distally Intact pulses distally Dorsiflexion/Plantar flexion intact Incision: dressing C/D/I   Assessment/Plan: 2 Days Post-Op Procedure(s) (LRB): RIGHT TOTAL HIP ARTHROPLASTY ANTERIOR APPROACH (Right) Up with therapy Discharge home with home health this afternoon.      Mcarthur Rossetti 01/08/2021, 8:49 AM

## 2021-01-08 NOTE — Plan of Care (Signed)
  Problem: Acute Rehab PT Goals(only PT should resolve) Goal: Pt Will Go Supine/Side To Sit Outcome: Adequate for Discharge Goal: Pt Will Go Sit To Supine/Side Outcome: Adequate for Discharge Goal: Patient Will Transfer Sit To/From Stand Outcome: Adequate for Discharge Goal: Pt Will Ambulate Outcome: Adequate for Discharge   Problem: Education: Goal: Required Educational Video(s) Outcome: Adequate for Discharge   Problem: Clinical Measurements: Goal: Postoperative complications will be avoided or minimized Outcome: Adequate for Discharge   Problem: Skin Integrity: Goal: Demonstration of wound healing without infection will improve Outcome: Adequate for Discharge   Problem: Education: Goal: Knowledge of General Education information will improve Description: Including pain rating scale, medication(s)/side effects and non-pharmacologic comfort measures Outcome: Adequate for Discharge   Problem: Health Behavior/Discharge Planning: Goal: Ability to manage health-related needs will improve Outcome: Adequate for Discharge   Problem: Clinical Measurements: Goal: Ability to maintain clinical measurements within normal limits will improve Outcome: Adequate for Discharge Goal: Will remain free from infection Outcome: Adequate for Discharge Goal: Diagnostic test results will improve Outcome: Adequate for Discharge Goal: Respiratory complications will improve Outcome: Adequate for Discharge Goal: Cardiovascular complication will be avoided Outcome: Adequate for Discharge   Problem: Activity: Goal: Risk for activity intolerance will decrease Outcome: Adequate for Discharge   Problem: Nutrition: Goal: Adequate nutrition will be maintained Outcome: Adequate for Discharge   Problem: Coping: Goal: Level of anxiety will decrease Outcome: Adequate for Discharge   Problem: Elimination: Goal: Will not experience complications related to bowel motility Outcome: Adequate for  Discharge Goal: Will not experience complications related to urinary retention Outcome: Adequate for Discharge   Problem: Pain Managment: Goal: General experience of comfort will improve Outcome: Adequate for Discharge   Problem: Safety: Goal: Ability to remain free from injury will improve Outcome: Adequate for Discharge   Problem: Skin Integrity: Goal: Risk for impaired skin integrity will decrease Outcome: Adequate for Discharge

## 2021-01-08 NOTE — Discharge Summary (Signed)
Patient ID: Mikayla Jenkins MRN: [id number] DOB/AGE: 1946-08-21 75 y.o.  Admit date: 01/06/2021 Discharge date: 01/08/2021  Admission Diagnoses:  Principal Problem:   Osteoarthritis of right hip Active Problems:   Status post total replacement of right hip   Discharge Diagnoses:  Same  Past Medical History:  Diagnosis Date  . Diabetes mellitus type II   . Hyperlipidemia   . Hypertension   . Renal disorder     Surgeries: Procedure(s): RIGHT TOTAL HIP ARTHROPLASTY ANTERIOR APPROACH on 01/06/2021   Consultants:   Discharged Condition: Improved  Hospital Course: Mikayla Jenkins is an 75 y.o. female who was admitted 01/06/2021 for operative treatment ofOsteoarthritis of right hip. Patient has severe unremitting pain that affects sleep, daily activities, and work/hobbies. After pre-op clearance the patient was taken to the operating room on 01/06/2021 and underwent  Procedure(s): RIGHT TOTAL HIP ARTHROPLASTY ANTERIOR APPROACH.    Patient was given perioperative antibiotics:  Anti-infectives (From admission, onward)   Start     Dose/Rate Route Frequency Ordered Stop   01/06/21 1800  ceFAZolin (ANCEF) IVPB 1 g/50 mL premix        1 g 100 mL/hr over 30 Minutes Intravenous Every 6 hours 01/06/21 1631 01/07/21 0051   01/06/21 1015  ceFAZolin (ANCEF) IVPB 2g/100 mL premix        2 g 200 mL/hr over 30 Minutes Intravenous On call to O.R. 01/06/21 1012 01/06/21 1201       Patient was given sequential compression devices, early ambulation, and chemoprophylaxis to prevent DVT.  Patient benefited maximally from hospital stay and there were no complications.    Recent vital signs:  Patient Vitals for the past 24 hrs:  BP Temp Temp src Pulse Resp SpO2  01/08/21 0803 121/60 98.9 F (37.2 C) Oral 63 18 100 %  01/08/21 0519 121/60 98.9 F (37.2 C) Oral 63 18 99 %  01/07/21 2143 (!) 145/56 100 F (37.8 C) Oral 70 16 99 %  01/07/21 1621 (!) 103/46 97.9 F (36.6 C) Oral (!) 59 15 96 %   01/07/21 1406 128/64 98.8 F (37.1 C) Oral 62 20 98 %     Recent laboratory studies:  Recent Labs    01/07/21 0345 01/08/21 0345  WBC 7.1 8.1  HGB 9.3* 8.5*  HCT 26.2* 24.5*  PLT 191 169  NA 132*  --   K 3.5  --   CL 97*  --   CO2 26  --   BUN 10  --   CREATININE 1.13*  --   GLUCOSE 199*  --   CALCIUM 8.8*  --      Discharge Medications:   Allergies as of 01/08/2021      Reactions   Metformin And Related Other (See Comments)   CKD stage 4      Medication List    STOP taking these medications   aspirin 81 MG EC tablet Replaced by: aspirin 81 MG chewable tablet     TAKE these medications   acetaminophen 650 MG CR tablet Commonly known as: TYLENOL Take 650 mg by mouth every 8 (eight) hours as needed for pain.   amLODipine 10 MG tablet Commonly known as: NORVASC Take 1 tablet (10 mg total) by mouth daily.   aspirin 81 MG chewable tablet Chew 1 tablet (81 mg total) by mouth 2 (two) times daily. Replaces: aspirin 81 MG EC tablet   atenolol-chlorthalidone 100-25 MG tablet Commonly known as: TENORETIC TAKE 1 TABLET EVERY DAY   benazepril  40 MG tablet Commonly known as: LOTENSIN TAKE 1 TABLET EVERY DAY   blood glucose meter kit and supplies Dispense based on patient and insurance preference. Use up to four times daily as directed. (FOR ICD-10 E10.9, E11.9).   blood glucose meter kit and supplies Dispense based on patient and insurance preference  Accuchek guide meter and supplies once daily testing dx e11.9   Calcium Carbonate-Vitamin D 600-400 MG-UNIT tablet Take 1 tablet by mouth daily.   cloNIDine 0.3 MG tablet Commonly known as: CATAPRES Take 1 and 1/2 tablets by mouth at bedtime What changed:   how much to take  how to take this  when to take this   glipiZIDE 2.5 MG 24 hr tablet Commonly known as: GLUCOTROL XL TAKE 1 TABLET WITH BREAKFAST EVERY TUESDAY, THURSDAY AND SATURDAY What changed:   how much to take  how to take this  when  to take this   HYDROcodone-acetaminophen 5-325 MG tablet Commonly known as: NORCO/VICODIN Take one half  to  one tablet by mouth once daily, as needed, for  Uncontrolled right  hip pain   lovastatin 40 MG tablet Commonly known as: MEVACOR TAKE 1 TABLET EVERY DAY   methocarbamol 500 MG tablet Commonly known as: ROBAXIN Take 1 tablet (500 mg total) by mouth every 6 (six) hours as needed for muscle spasms.   multivitamin tablet Take 1 tablet by mouth daily.   oxyCODONE 5 MG immediate release tablet Commonly known as: Oxy IR/ROXICODONE Take 1-2 tablets (5-10 mg total) by mouth every 6 (six) hours as needed for moderate pain (pain score 4-6).   spironolactone 25 MG tablet Commonly known as: ALDACTONE Take half tablet by mouth every other day What changed:   how much to take  how to take this  when to take this            Durable Medical Equipment  (From admission, onward)         Start     Ordered   01/06/21 1632  DME 3 n 1  Once        01/06/21 1631   01/06/21 1632  DME Walker rolling  Once       Question Answer Comment  Walker: With 5 Inch Wheels   Patient needs a walker to treat with the following condition Status post total replacement of right hip      01/06/21 1631          Diagnostic Studies: DG Pelvis Portable  Result Date: 01/06/2021 CLINICAL DATA:  Status post right hip replacement EXAM: PORTABLE PELVIS 1 VIEWS COMPARISON:  Intraoperative films from earlier in the same day. FINDINGS: Right hip prosthesis is noted. Pelvic ring is intact. Degenerative changes of the pubic symphysis are seen. No acute soft tissue abnormality is noted. IMPRESSION: Status post right hip replacement. Electronically Signed   By: Inez Catalina M.D.   On: 01/06/2021 15:31   DG C-Arm 1-60 Min  Result Date: 01/06/2021 CLINICAL DATA:  Right hip arthroplasty EXAM: OPERATIVE RIGHT HIP WITH PELVIS; DG C-ARM 1-60 MIN COMPARISON:  None. FLUOROSCOPY TIME:  Radiation Exposure Index (as  provided by the fluoroscopic device): 1.89 mGy If the device does not provide the exposure index: Fluoroscopy Time:  23 seconds Number of Acquired Images:  4 FINDINGS: Initial images again demonstrate significant degenerative change of the right hip joint with acetabular protrusio. Subsequent films demonstrate right hip replacement in satisfactory position. No acute soft tissue abnormality is noted. IMPRESSION: Right hip replacement. Electronically  Signed   By: Inez Catalina M.D.   On: 01/06/2021 15:31   DG HIP OPERATIVE UNILAT W OR W/O PELVIS RIGHT  Result Date: 01/06/2021 CLINICAL DATA:  Right hip arthroplasty EXAM: OPERATIVE RIGHT HIP WITH PELVIS; DG C-ARM 1-60 MIN COMPARISON:  None. FLUOROSCOPY TIME:  Radiation Exposure Index (as provided by the fluoroscopic device): 1.89 mGy If the device does not provide the exposure index: Fluoroscopy Time:  23 seconds Number of Acquired Images:  4 FINDINGS: Initial images again demonstrate significant degenerative change of the right hip joint with acetabular protrusio. Subsequent films demonstrate right hip replacement in satisfactory position. No acute soft tissue abnormality is noted. IMPRESSION: Right hip replacement. Electronically Signed   By: Inez Catalina M.D.   On: 01/06/2021 15:31    Disposition: Discharge disposition: 01-Home or Self Care          Follow-up Information    Mcarthur Rossetti, MD. Go on 01/19/2021.   Specialty: Orthopedic Surgery Why: at 2:00 pm for your 2 week post-op appointment Contact information: [place] [address] [place], [place] Follow up.   Specialty: [place] Why: They will do your home health care at your home Contact information: [address] [place] [address] [phone]                Signed: Mcarthur Rossetti 01/08/2021, 8:52 AM

## 2021-01-08 NOTE — Progress Notes (Signed)
Physical Therapy Treatment Patient Details Name: Mikayla Jenkins MRN: [id number] DOB: Dec 31, 1945 Today's Date: 01/08/2021    History of Present Illness Pt adm 3/22 for rt THR direct anterior approach. PMH - DM, HTN, arthritis    PT Comments    Pt supine in bed on arrival.  Pt reports feeling sleepy.  Assessed orthostatic vitals based on presentation yesterday.  No complaints of dizziness today and no drastic changes in BP readings.  Pt tolerated session well and ready for d/c home.  She will need and Bed side commode for d/c home.    Orthostatic Vitals: Lying:98/53 Sitting:96/50 Standing:100/57 Standing after 3 min:  106/66   Follow Up Recommendations  Follow surgeon's recommendation for DC plan and follow-up therapies;Supervision for mobility/OOB     Equipment Recommendations  Rolling walker with 5" wheels;3in1 (PT)    Recommendations for Other Services       Precautions / Restrictions Precautions Precautions: Fall Restrictions Weight Bearing Restrictions: Yes RLE Weight Bearing: Weight bearing as tolerated    Mobility  Bed Mobility Overal bed mobility: Needs Assistance Bed Mobility: Supine to Sit     Supine to sit: Supervision     General bed mobility comments: Increased time and effort but able to progress to edge of bed unsupported.    Transfers Overall transfer level: Needs assistance Equipment used: Rolling walker (2 wheeled) Transfers: Sit to/from Stand Sit to Stand: Supervision         General transfer comment: Cues for hand placement, bed slightly elevated.  Pt tolerated well with minor cues for technique.  Ambulation/Gait Ambulation/Gait assistance: Supervision Gait Distance (Feet): 320 Feet Assistive device: Rolling walker (2 wheeled) Gait Pattern/deviations: Step-through pattern;Trunk flexed;Antalgic     General Gait Details: Minor antalgic gt but good improvement in gt symmetry and fluidity of gt training.  Cues to keep device close to her  body during turns and backing.   Stairs             Wheelchair Mobility    Modified Rankin (Stroke Patients Only)       Balance Overall balance assessment: Mild deficits observed, not formally tested                                          Cognition Arousal/Alertness: Awake/alert Behavior During Therapy: WFL for tasks assessed/performed Overall Cognitive Status: Within Functional Limits for tasks assessed                                        Exercises General Exercises - Lower Extremity Long Arc Quad: AROM;Right;10 reps;Seated    General Comments        Pertinent Vitals/Pain Pain Assessment: 0-10 Pain Score: 6  Pain Location: rt hip Pain Descriptors / Indicators: Aching;Throbbing Pain Intervention(s): Monitored during session;Repositioned    Home Living                      Prior Function            PT Goals (current goals can now be found in the care plan section) Acute Rehab PT Goals Patient Stated Goal: return home Potential to Achieve Goals: Good Progress towards PT goals: Progressing toward goals    Frequency    7X/week      PT Plan Current  plan remains appropriate    Co-evaluation              AM-PAC PT "6 Clicks" Mobility   Outcome Measure  Help needed turning from your back to your side while in a flat bed without using bedrails?: A Little Help needed moving from lying on your back to sitting on the side of a flat bed without using bedrails?: A Little Help needed moving to and from a bed to a chair (including a wheelchair)?: A Little Help needed standing up from a chair using your arms (e.g., wheelchair or bedside chair)?: A Little Help needed to walk in hospital room?: A Little Help needed climbing 3-5 steps with a railing? : A Little 6 Click Score: 18    End of Session Equipment Utilized During Treatment: Gait belt Activity Tolerance: Patient tolerated treatment well Patient  left: in chair;with call bell/phone within reach;with chair alarm set Nurse Communication: Mobility status (BPs) PT Visit Diagnosis: Other abnormalities of gait and mobility (R26.89);Pain Pain - Right/Left: Right Pain - part of body: Hip     Time: 3500-9381 PT Time Calculation (min) (ACUTE ONLY): 30 min  Charges:  $Gait Training: 8-22 mins $Therapeutic Activity: 8-22 mins                     Mikayla Jenkins , PTA Acute Rehabilitation Services Pager [phone] Office [phone]     Mikayla Jenkins 01/08/2021, 10:59 AM

## 2021-01-08 NOTE — TOC Transition Note (Signed)
Transition of Care [facility]) - CM/SW Discharge Note   Patient Details  Name: Mikayla Jenkins MRN: [id number] Date of Birth: October 12, 1946  Transition of [facility]) CM/SW Contact:  Sharin Mons, RN Phone Number: 01/08/2021, 11:06 AM   Clinical Narrative:    Patient will DC to: home  Anticipated DC date: 01/08/2021 Family notified: yes Transport by: car          - s/p  total replacement of right hip, 3/23 Per MD patient ready for DC today . RN, patient and patient's family notified of DC. Home health services setup with [facility] preoperatively. DME ( rolling walker and 3 in 1/BSC )will be provided to pt prior to d/c from floor stock by nurse.  Pt without Rx med concerns.   Post hospital f/u noted on AVS.  Husband to provide transportation to home.  Randalyn Ahmed (Spouse)     [phone]      RNCM will sign off for now as intervention is no longer needed. Please consult [place] again if new needs arise.    Final next level of care: Home w Home Health Services Barriers to Discharge: No Barriers Identified   Patient Goals and CMS Choice   CMS Medicare.gov Compare Post Acute Care list provided to:: Patient Choice offered to / list presented to : Patient  Discharge Placement                       Discharge Plan and Services                DME Arranged: Walker rolling,Bedside commode DME Agency: [employer] Date DME Agency Contacted: 01/08/21 Time DME Agency Contacted: [id number] Representative spoke with at DME Agency: DME will be provided from floor stock by nurse prior to d/c HH Arranged: PT HH Agency: [facility] [employer] Date [place]: 01/08/21 Time [place]: 51 Representative spoke with at [place]: [place] , aware of d/c plan  Social Determinants of Health ([place]) Interventions     Readmission Risk Interventions No flowsheet data found.

## 2021-01-08 NOTE — Plan of Care (Signed)
  Problem: Education: Goal: Required Educational Video(s) Outcome: Progressing   Problem: Skin Integrity: Goal: Demonstration of wound healing without infection will improve Outcome: Progressing   Problem: Coping: Goal: Level of anxiety will decrease Outcome: Progressing   Problem: Pain Managment: Goal: General experience of comfort will improve Outcome: Progressing

## 2021-01-09 ENCOUNTER — Telehealth: Payer: Self-pay | Admitting: *Deleted

## 2021-01-09 ENCOUNTER — Telehealth: Payer: Self-pay

## 2021-01-09 NOTE — Care Plan (Signed)
Office RNCM call to patient to check status after discharge from hospital yesterday and patient being an Ortho bundle through [facility]. Spoke with patient's daughter, Janace Hoard, Phone # [phone], who is a PharmD and staying with her mother during return home. She reports patient is pale, slightly dizzy and BPs have been running low last night and today. She is concerned regarding BP medications she is on and states she doesn't feel comfortable giving her these since BP running low. Discussed post-op care including pain medication management, muscle relaxer and scheduling of these to best suit pain needs after surgery. Reviewed that most patient's are dizzy from blood loss anemia after surgery and also have some BP instability. Expectation is for blood counts to start increasing as she is 3 days post op today. Reminder to push fluids to help with all these symptoms. Message sent to Dr. Ninfa Linden to notify of conversation and that [facility] will call Dr. Griffin Dakin office to have them address BP meds and management. Spoke with HHPT, who will be coming tomorrow to see patient and updated on current status. Daughter also notified that mother didn't get compression hose or a 3in1 that was ordered from the hospital. CM will check on these as well so daughter can pick up before the end of the day. Spoke with Dr. Griffin Dakin office and someone will be in touch regarding message about BP meds and concerns.

## 2021-01-09 NOTE — Telephone Encounter (Signed)
I agree and I will update the daughter. HHPT coming tomorrow as well and they have been updated on situation and will get up with me if needed over the weekend. Thanks.

## 2021-01-09 NOTE — Telephone Encounter (Signed)
Ortho bundle D/C call completed. See care plan note.

## 2021-01-09 NOTE — Telephone Encounter (Signed)
Sherry from Ortho is calling  BP is running a lil low  Hip Surgery Tuesday    We need to reach out to pt to advise if we can stop some of the Medication.

## 2021-01-09 NOTE — Telephone Encounter (Signed)
Her daughter is a pharm D and is there taking care of her and her ortho said her bp is running low and wants to temp be taken off some of her blood pressure meds. Please advise

## 2021-01-09 NOTE — Telephone Encounter (Signed)
I think she should hold all of her BP meds thru the weekend and see how she does.  Obviously, if not doing well, then go to the ED.

## 2021-01-09 NOTE — Telephone Encounter (Signed)
Called patient and advised to hold Clonidine for now. We will schedule office visit after her surgery and will make adjustment if needed.

## 2021-01-09 NOTE — Telephone Encounter (Signed)
Patient heard back from [facility] regarding BP medications and their only suggestion per Dr. Serita Grit note in chart was to hold Clonidine. She is on several other BP medications as well. Daughter states BP has been low hundreds over 50s today. She is concerned if she gives her other BP medications, it will drop. I did try to explain the dizziness could also be from pain medications. Daughter called back to their on-call line as they were closed at noon and the on-call nurse told her if she was concerned about what medications to give for BP, then she should go to the ED to be evaluated. She has not given her any BP medications today. Any other recommendations besides going to the ED? Thanks.

## 2021-01-10 DIAGNOSIS — E1122 Type 2 diabetes mellitus with diabetic chronic kidney disease: Secondary | ICD-10-CM | POA: Diagnosis not present

## 2021-01-10 DIAGNOSIS — E785 Hyperlipidemia, unspecified: Secondary | ICD-10-CM | POA: Diagnosis not present

## 2021-01-10 DIAGNOSIS — I129 Hypertensive chronic kidney disease with stage 1 through stage 4 chronic kidney disease, or unspecified chronic kidney disease: Secondary | ICD-10-CM | POA: Diagnosis not present

## 2021-01-10 DIAGNOSIS — M5441 Lumbago with sciatica, right side: Secondary | ICD-10-CM | POA: Diagnosis not present

## 2021-01-10 DIAGNOSIS — N1832 Chronic kidney disease, stage 3b: Secondary | ICD-10-CM | POA: Diagnosis not present

## 2021-01-10 DIAGNOSIS — Z86018 Personal history of other benign neoplasm: Secondary | ICD-10-CM | POA: Diagnosis not present

## 2021-01-10 DIAGNOSIS — Z471 Aftercare following joint replacement surgery: Secondary | ICD-10-CM | POA: Diagnosis not present

## 2021-01-10 DIAGNOSIS — D631 Anemia in chronic kidney disease: Secondary | ICD-10-CM | POA: Diagnosis not present

## 2021-01-10 DIAGNOSIS — J302 Other seasonal allergic rhinitis: Secondary | ICD-10-CM | POA: Diagnosis not present

## 2021-01-13 ENCOUNTER — Telehealth: Payer: Self-pay | Admitting: *Deleted

## 2021-01-13 NOTE — Telephone Encounter (Signed)
Ortho bundle 7 day call completed. 

## 2021-01-14 DIAGNOSIS — N1832 Chronic kidney disease, stage 3b: Secondary | ICD-10-CM | POA: Diagnosis not present

## 2021-01-14 DIAGNOSIS — E1122 Type 2 diabetes mellitus with diabetic chronic kidney disease: Secondary | ICD-10-CM | POA: Diagnosis not present

## 2021-01-14 DIAGNOSIS — J302 Other seasonal allergic rhinitis: Secondary | ICD-10-CM | POA: Diagnosis not present

## 2021-01-14 DIAGNOSIS — Z471 Aftercare following joint replacement surgery: Secondary | ICD-10-CM | POA: Diagnosis not present

## 2021-01-14 DIAGNOSIS — D631 Anemia in chronic kidney disease: Secondary | ICD-10-CM | POA: Diagnosis not present

## 2021-01-14 DIAGNOSIS — E785 Hyperlipidemia, unspecified: Secondary | ICD-10-CM | POA: Diagnosis not present

## 2021-01-14 DIAGNOSIS — Z86018 Personal history of other benign neoplasm: Secondary | ICD-10-CM | POA: Diagnosis not present

## 2021-01-14 DIAGNOSIS — I129 Hypertensive chronic kidney disease with stage 1 through stage 4 chronic kidney disease, or unspecified chronic kidney disease: Secondary | ICD-10-CM | POA: Diagnosis not present

## 2021-01-14 DIAGNOSIS — M5441 Lumbago with sciatica, right side: Secondary | ICD-10-CM | POA: Diagnosis not present

## 2021-01-16 DIAGNOSIS — E785 Hyperlipidemia, unspecified: Secondary | ICD-10-CM | POA: Diagnosis not present

## 2021-01-16 DIAGNOSIS — M5441 Lumbago with sciatica, right side: Secondary | ICD-10-CM | POA: Diagnosis not present

## 2021-01-16 DIAGNOSIS — N1832 Chronic kidney disease, stage 3b: Secondary | ICD-10-CM | POA: Diagnosis not present

## 2021-01-16 DIAGNOSIS — Z86018 Personal history of other benign neoplasm: Secondary | ICD-10-CM | POA: Diagnosis not present

## 2021-01-16 DIAGNOSIS — D631 Anemia in chronic kidney disease: Secondary | ICD-10-CM | POA: Diagnosis not present

## 2021-01-16 DIAGNOSIS — E1122 Type 2 diabetes mellitus with diabetic chronic kidney disease: Secondary | ICD-10-CM | POA: Diagnosis not present

## 2021-01-16 DIAGNOSIS — I129 Hypertensive chronic kidney disease with stage 1 through stage 4 chronic kidney disease, or unspecified chronic kidney disease: Secondary | ICD-10-CM | POA: Diagnosis not present

## 2021-01-16 DIAGNOSIS — Z471 Aftercare following joint replacement surgery: Secondary | ICD-10-CM | POA: Diagnosis not present

## 2021-01-16 DIAGNOSIS — J302 Other seasonal allergic rhinitis: Secondary | ICD-10-CM | POA: Diagnosis not present

## 2021-01-19 ENCOUNTER — Encounter: Payer: Self-pay | Admitting: Orthopaedic Surgery

## 2021-01-19 ENCOUNTER — Ambulatory Visit (INDEPENDENT_AMBULATORY_CARE_PROVIDER_SITE_OTHER): Payer: Medicare HMO | Admitting: Orthopaedic Surgery

## 2021-01-19 ENCOUNTER — Telehealth: Payer: Self-pay | Admitting: *Deleted

## 2021-01-19 ENCOUNTER — Other Ambulatory Visit: Payer: Self-pay

## 2021-01-19 DIAGNOSIS — Z96641 Presence of right artificial hip joint: Secondary | ICD-10-CM

## 2021-01-19 NOTE — Progress Notes (Signed)
The patient is 2 weeks tomorrow status post a right total hip arthroplasty.  She has been getting around pretty well.  She has been on a baby aspirin twice a day.  She was on once a day aspirin prior to surgery and she can go back to that.  She has notes from therapy.  They have 2 more visits with her.  She has been ambulating with a walker and wearing TED hose.  Her right hip incision looks good so remove the staples in place Steri-Strips.  She is just slightly longer on the right than the left when laying her supine.  Overall she looks good.  There is no calf pain and no significant swelling in the feet or ankles.  She will continue to increase her activities as comfort allows.  I would like to see her back in 4 weeks for repeat visit but no x-rays are needed.  If there are any issues before then they will let us know.

## 2021-01-19 NOTE — Telephone Encounter (Signed)
Ortho bundle 14 day in office visit completed. 

## 2021-01-21 DIAGNOSIS — N1832 Chronic kidney disease, stage 3b: Secondary | ICD-10-CM | POA: Diagnosis not present

## 2021-01-21 DIAGNOSIS — I129 Hypertensive chronic kidney disease with stage 1 through stage 4 chronic kidney disease, or unspecified chronic kidney disease: Secondary | ICD-10-CM | POA: Diagnosis not present

## 2021-01-21 DIAGNOSIS — M5441 Lumbago with sciatica, right side: Secondary | ICD-10-CM | POA: Diagnosis not present

## 2021-01-21 DIAGNOSIS — Z86018 Personal history of other benign neoplasm: Secondary | ICD-10-CM | POA: Diagnosis not present

## 2021-01-21 DIAGNOSIS — E1122 Type 2 diabetes mellitus with diabetic chronic kidney disease: Secondary | ICD-10-CM | POA: Diagnosis not present

## 2021-01-21 DIAGNOSIS — J302 Other seasonal allergic rhinitis: Secondary | ICD-10-CM | POA: Diagnosis not present

## 2021-01-21 DIAGNOSIS — D631 Anemia in chronic kidney disease: Secondary | ICD-10-CM | POA: Diagnosis not present

## 2021-01-21 DIAGNOSIS — Z471 Aftercare following joint replacement surgery: Secondary | ICD-10-CM | POA: Diagnosis not present

## 2021-01-21 DIAGNOSIS — E785 Hyperlipidemia, unspecified: Secondary | ICD-10-CM | POA: Diagnosis not present

## 2021-01-23 DIAGNOSIS — E1122 Type 2 diabetes mellitus with diabetic chronic kidney disease: Secondary | ICD-10-CM | POA: Diagnosis not present

## 2021-01-23 DIAGNOSIS — M5441 Lumbago with sciatica, right side: Secondary | ICD-10-CM | POA: Diagnosis not present

## 2021-01-23 DIAGNOSIS — N1832 Chronic kidney disease, stage 3b: Secondary | ICD-10-CM | POA: Diagnosis not present

## 2021-01-23 DIAGNOSIS — Z86018 Personal history of other benign neoplasm: Secondary | ICD-10-CM | POA: Diagnosis not present

## 2021-01-23 DIAGNOSIS — J302 Other seasonal allergic rhinitis: Secondary | ICD-10-CM | POA: Diagnosis not present

## 2021-01-23 DIAGNOSIS — Z471 Aftercare following joint replacement surgery: Secondary | ICD-10-CM | POA: Diagnosis not present

## 2021-01-23 DIAGNOSIS — E785 Hyperlipidemia, unspecified: Secondary | ICD-10-CM | POA: Diagnosis not present

## 2021-01-23 DIAGNOSIS — D631 Anemia in chronic kidney disease: Secondary | ICD-10-CM | POA: Diagnosis not present

## 2021-01-23 DIAGNOSIS — I129 Hypertensive chronic kidney disease with stage 1 through stage 4 chronic kidney disease, or unspecified chronic kidney disease: Secondary | ICD-10-CM | POA: Diagnosis not present

## 2021-01-30 DIAGNOSIS — N189 Chronic kidney disease, unspecified: Secondary | ICD-10-CM | POA: Diagnosis not present

## 2021-01-30 DIAGNOSIS — I129 Hypertensive chronic kidney disease with stage 1 through stage 4 chronic kidney disease, or unspecified chronic kidney disease: Secondary | ICD-10-CM | POA: Diagnosis not present

## 2021-01-30 DIAGNOSIS — I5032 Chronic diastolic (congestive) heart failure: Secondary | ICD-10-CM | POA: Diagnosis not present

## 2021-01-30 DIAGNOSIS — E1122 Type 2 diabetes mellitus with diabetic chronic kidney disease: Secondary | ICD-10-CM | POA: Diagnosis not present

## 2021-01-30 DIAGNOSIS — D638 Anemia in other chronic diseases classified elsewhere: Secondary | ICD-10-CM | POA: Diagnosis not present

## 2021-02-04 DIAGNOSIS — D638 Anemia in other chronic diseases classified elsewhere: Secondary | ICD-10-CM | POA: Diagnosis not present

## 2021-02-04 DIAGNOSIS — I5032 Chronic diastolic (congestive) heart failure: Secondary | ICD-10-CM | POA: Diagnosis not present

## 2021-02-04 DIAGNOSIS — E1122 Type 2 diabetes mellitus with diabetic chronic kidney disease: Secondary | ICD-10-CM | POA: Diagnosis not present

## 2021-02-04 DIAGNOSIS — N189 Chronic kidney disease, unspecified: Secondary | ICD-10-CM | POA: Diagnosis not present

## 2021-02-04 DIAGNOSIS — I129 Hypertensive chronic kidney disease with stage 1 through stage 4 chronic kidney disease, or unspecified chronic kidney disease: Secondary | ICD-10-CM | POA: Diagnosis not present

## 2021-02-10 ENCOUNTER — Telehealth: Payer: Self-pay | Admitting: *Deleted

## 2021-02-10 NOTE — Telephone Encounter (Signed)
Ortho bundle attempt at 30 day call. No answer and left VM -will call back.

## 2021-02-13 ENCOUNTER — Telehealth: Payer: Self-pay | Admitting: *Deleted

## 2021-02-13 NOTE — Telephone Encounter (Signed)
2nd attempt at Ortho bundle call. Will attempt to reach at next office visit.

## 2021-02-18 ENCOUNTER — Ambulatory Visit (INDEPENDENT_AMBULATORY_CARE_PROVIDER_SITE_OTHER): Payer: Medicare HMO | Admitting: Orthopaedic Surgery

## 2021-02-18 ENCOUNTER — Encounter: Payer: Self-pay | Admitting: Orthopaedic Surgery

## 2021-02-18 DIAGNOSIS — M2170 Unequal limb length (acquired), unspecified site: Secondary | ICD-10-CM

## 2021-02-18 DIAGNOSIS — Z96641 Presence of right artificial hip joint: Secondary | ICD-10-CM

## 2021-02-18 NOTE — Progress Notes (Signed)
The patient is now 6 weeks status post a right total hip arthroplasty.  She is 75 years old and reports significant improvement in her pain.  Her only issue is a slight leg length discrepancy with the right side longer than the left.  She tolerates me easily putting her right hip through internal and external rotation.  There is no pain with this at all.  When I have her lay supine she has slightly longer on the right than the left.  I did give her prescription for an orthotic company to provide an insert for her left shoe that will help with balance or better.  All questions and concerns were answered and addressed.  I will see her back in 6 months with a standing low AP pelvis and lateral of the right operative hip.  Of note there is no left hip issues at all.

## 2021-02-23 ENCOUNTER — Telehealth: Payer: Self-pay | Admitting: *Deleted

## 2021-02-23 NOTE — Telephone Encounter (Signed)
Ortho bundle 30 day call and survey completed. ?

## 2021-03-12 ENCOUNTER — Other Ambulatory Visit (INDEPENDENT_AMBULATORY_CARE_PROVIDER_SITE_OTHER): Payer: Self-pay

## 2021-03-13 MED ORDER — METHOCARBAMOL 500 MG PO TABS: 500.0000 mg | ORAL_TABLET | Freq: Four times a day (QID) | ORAL | 1 refills | Status: DC | PRN

## 2021-03-13 MED ORDER — CLONIDINE HCL 0.3 MG PO TABS: 0.3000 mg | ORAL_TABLET | Freq: Every day | ORAL | 0 refills | Status: DC

## 2021-03-17 ENCOUNTER — Telehealth (INDEPENDENT_AMBULATORY_CARE_PROVIDER_SITE_OTHER): Payer: Self-pay

## 2021-03-19 NOTE — Telephone Encounter (Signed)
Mail order is sending her medication refill request to [facility] office. Looks like the PCP , Dr Posey Pronto address is incorrect on the fax and sent a copy via e-mail to office mgr, Rhae Hammock.

## 2021-04-06 ENCOUNTER — Other Ambulatory Visit: Payer: Self-pay | Admitting: Family Medicine

## 2021-04-13 ENCOUNTER — Telehealth: Payer: Self-pay | Admitting: *Deleted

## 2021-04-13 NOTE — Telephone Encounter (Signed)
Ortho bundle 90 day call completed. 

## 2021-04-27 DIAGNOSIS — E1121 Type 2 diabetes mellitus with diabetic nephropathy: Secondary | ICD-10-CM | POA: Diagnosis not present

## 2021-04-27 DIAGNOSIS — N1832 Chronic kidney disease, stage 3b: Secondary | ICD-10-CM | POA: Diagnosis not present

## 2021-04-28 LAB — BASIC METABOLIC PANEL
BUN/Creatinine Ratio: 15 (ref 12–28)
BUN: 19 mg/dL (ref 8–27)
CO2: 22 mmol/L (ref 20–29)
Calcium: 10.4 mg/dL — ABNORMAL HIGH (ref 8.7–10.3)
Chloride: 99 mmol/L (ref 96–106)
Creatinine, Ser: 1.29 mg/dL — ABNORMAL HIGH (ref 0.57–1.00)
Glucose: 129 mg/dL — ABNORMAL HIGH (ref 65–99)
Potassium: 4.1 mmol/L (ref 3.5–5.2)
Sodium: 137 mmol/L (ref 134–144)
eGFR: 43 mL/min/{1.73_m2} — ABNORMAL LOW (ref >59)

## 2021-04-28 LAB — HEMOGLOBIN A1C
Est. average glucose Bld gHb Est-mCnc: 140 mg/dL
Hgb A1c MFr Bld: 6.5 % — ABNORMAL HIGH (ref 4.8–5.6)

## 2021-04-30 ENCOUNTER — Ambulatory Visit (INDEPENDENT_AMBULATORY_CARE_PROVIDER_SITE_OTHER): Payer: Medicare HMO | Admitting: Family Medicine

## 2021-04-30 ENCOUNTER — Encounter: Payer: Self-pay | Admitting: Family Medicine

## 2021-04-30 ENCOUNTER — Other Ambulatory Visit: Payer: Self-pay

## 2021-04-30 VITALS — BP 150/72 | HR 54 | Temp 98.0°F | Resp 18 | Ht 67.0 in | Wt 149.0 lb

## 2021-04-30 DIAGNOSIS — Z1231 Encounter for screening mammogram for malignant neoplasm of breast: Secondary | ICD-10-CM

## 2021-04-30 DIAGNOSIS — I10 Essential (primary) hypertension: Secondary | ICD-10-CM

## 2021-04-30 DIAGNOSIS — E785 Hyperlipidemia, unspecified: Secondary | ICD-10-CM

## 2021-04-30 DIAGNOSIS — Z0000 Encounter for general adult medical examination without abnormal findings: Secondary | ICD-10-CM | POA: Diagnosis not present

## 2021-04-30 DIAGNOSIS — N1832 Chronic kidney disease, stage 3b: Secondary | ICD-10-CM

## 2021-04-30 DIAGNOSIS — E1121 Type 2 diabetes mellitus with diabetic nephropathy: Secondary | ICD-10-CM

## 2021-04-30 NOTE — Progress Notes (Signed)
    Mikayla Jenkins     MRN: [id number]      DOB: 1946/09/12  HPI: Patient is in for annual physical exam. No other health concerns are expressed or addressed at the visit. Recent labs,  are reviewed. Immunization is reviewed , and  updated if needed.   PE: BP (!) 150/72 (BP Location: Right Arm, Patient Position: Sitting, Cuff Size: Large)   Pulse (!) 54   Temp 98 F (36.7 C)   Resp 18   Ht 5\' 7"  (1.702 m)   Wt 149 lb (67.6 kg)   SpO2 96%   BMI 23.34 kg/m   Pleasant  female, alert and oriented x 3, in no cardio-pulmonary distress. Afebrile. HEENT No facial trauma or asymetry. Sinuses non tender.  Extra occullar muscles intact.. External ears normal, . Neck: supple, no adenopathy,JVD or thyromegaly.No bruits.  Chest: Clear to ascultation bilaterally.No crackles or wheezes. Non tender to palpation    Cardiovascular system; Heart sounds normal,  S1 and  S2 ,no S3.  No murmur, or thrill. Apical beat not displaced Peripheral pulses normal.  Abdomen: Soft, non tender, no organomegaly or masses. No bruits. Bowel sounds normal. No guarding, tenderness or rebound.     Musculoskeletal exam: Decreased though adequate ROM of spine, hips , shoulders and knees. No deformity ,swelling or crepitus noted. No muscle wasting or atrophy.   Neurologic: Cranial nerves 2 to 12 intact. Power, tone ,sensation and reflexes normal throughout. No disturbance in gait. No tremor.  Skin: Intact, no ulceration, erythema , scaling or rash noted. Pigmentation normal throughout  Psych; Normal mood and affect. Judgement and concentration normal   Assessment & Plan:  Annual physical exam Annual exam as documented. Counseling done  re healthy lifestyle involving commitment to 150 minutes exercise per week, heart healthy diet, and attaining healthy weight.The importance of adequate sleep also discussed. Regular seat belt use and home safety, is also discussed. Changes in health habits  are decided on by the patient with goals and time frames  set for achieving them. Immunization and cancer screening needs are specifically addressed at this visit.

## 2021-04-30 NOTE — Assessment & Plan Note (Signed)

## 2021-04-30 NOTE — Patient Instructions (Addendum)
Follow-up in office with MD in 4 months call if you need me sooner.  Please get fasting lipids CMP and EGFR hemoglobin A1c and CBC 3 to 5 days before next visit.  COVID booster is overdue please get this as soon as possible    Please get your Shingrix vaccines at your pharmacy these are due.  No change in medications at this time and keep up good health habits.  Thanks for choosing [facility], we consider it a privelige to serve you.   Please schedule Dec 22 or after mammogram at checkout  Increase portion sizes so that you gain weight

## 2021-05-02 ENCOUNTER — Encounter: Payer: Self-pay | Admitting: Family Medicine

## 2021-05-04 DIAGNOSIS — N189 Chronic kidney disease, unspecified: Secondary | ICD-10-CM | POA: Diagnosis not present

## 2021-05-04 DIAGNOSIS — I5032 Chronic diastolic (congestive) heart failure: Secondary | ICD-10-CM | POA: Diagnosis not present

## 2021-05-04 DIAGNOSIS — E559 Vitamin D deficiency, unspecified: Secondary | ICD-10-CM | POA: Diagnosis not present

## 2021-05-04 DIAGNOSIS — I129 Hypertensive chronic kidney disease with stage 1 through stage 4 chronic kidney disease, or unspecified chronic kidney disease: Secondary | ICD-10-CM | POA: Diagnosis not present

## 2021-05-04 DIAGNOSIS — E1122 Type 2 diabetes mellitus with diabetic chronic kidney disease: Secondary | ICD-10-CM | POA: Diagnosis not present

## 2021-05-04 DIAGNOSIS — D638 Anemia in other chronic diseases classified elsewhere: Secondary | ICD-10-CM | POA: Diagnosis not present

## 2021-05-08 DIAGNOSIS — E1122 Type 2 diabetes mellitus with diabetic chronic kidney disease: Secondary | ICD-10-CM | POA: Diagnosis not present

## 2021-05-08 DIAGNOSIS — E871 Hypo-osmolality and hyponatremia: Secondary | ICD-10-CM | POA: Diagnosis not present

## 2021-05-08 DIAGNOSIS — I5032 Chronic diastolic (congestive) heart failure: Secondary | ICD-10-CM | POA: Diagnosis not present

## 2021-05-08 DIAGNOSIS — D638 Anemia in other chronic diseases classified elsewhere: Secondary | ICD-10-CM | POA: Diagnosis not present

## 2021-05-08 DIAGNOSIS — N189 Chronic kidney disease, unspecified: Secondary | ICD-10-CM | POA: Diagnosis not present

## 2021-05-08 DIAGNOSIS — I129 Hypertensive chronic kidney disease with stage 1 through stage 4 chronic kidney disease, or unspecified chronic kidney disease: Secondary | ICD-10-CM | POA: Diagnosis not present

## 2021-05-26 ENCOUNTER — Other Ambulatory Visit: Payer: Self-pay

## 2021-05-26 ENCOUNTER — Ambulatory Visit (INDEPENDENT_AMBULATORY_CARE_PROVIDER_SITE_OTHER): Payer: Medicare HMO

## 2021-05-26 VITALS — BP 129/72 | HR 58 | Temp 98.3°F | Resp 20 | Ht 67.0 in | Wt 147.0 lb

## 2021-05-26 DIAGNOSIS — Z0000 Encounter for general adult medical examination without abnormal findings: Secondary | ICD-10-CM | POA: Diagnosis not present

## 2021-05-26 NOTE — Progress Notes (Signed)
Subjective:   Mikayla Jenkins is a 75 y.o. female who presents for Medicare Annual (Subsequent) preventive examination.         Objective:    There were no vitals filed for this visit. There is no height or weight on file to calculate BMI.  Advanced Directives 01/07/2021 01/02/2021 05/22/2020 11/16/2019 05/16/2018 05/02/2017 06/13/2015  Does Patient Have a Medical Advance Directive? No No No No Yes No No  Does patient want to make changes to medical advance directive? - - - - Yes (ED - Information included in AVS) - -  Would patient like information on creating a medical advance directive? No - Patient declined - No - Patient declined - - Yes (MAU/Ambulatory/Procedural Areas - Information given) No - patient declined information    Current Medications (verified) Outpatient Encounter Medications as of 05/26/2021  Medication Sig   acetaminophen (TYLENOL) 650 MG CR tablet Take 650 mg by mouth every 8 (eight) hours as needed for pain.   amLODipine (NORVASC) 10 MG tablet Take 1 tablet (10 mg total) by mouth daily.   aspirin 81 MG chewable tablet Chew 1 tablet (81 mg total) by mouth 2 (two) times daily.   atenolol-chlorthalidone (TENORETIC) 100-25 MG tablet TAKE 1 TABLET EVERY DAY (Patient taking differently: Take 1 tablet by mouth daily.)   benazepril (LOTENSIN) 40 MG tablet TAKE 1 TABLET EVERY DAY   blood glucose meter kit and supplies Dispense based on patient and insurance preference. Use up to four times daily as directed. (FOR ICD-10 E10.9, E11.9).   blood glucose meter kit and supplies Dispense based on patient and insurance preference  Accuchek guide meter and supplies once daily testing dx e11.9   Calcium Carbonate-Vitamin D 600-400 MG-UNIT tablet Take 1 tablet by mouth daily.   cloNIDine (CATAPRES) 0.3 MG tablet Take 1 tablet (0.3 mg total) by mouth at bedtime. Take 1 and 1/2 tablets by mouth at bedtime   glipiZIDE (GLUCOTROL XL) 2.5 MG 24 hr tablet TAKE 1 TABLET WITH BREAKFAST EVERY  TUESDAY, THURSDAY AND SATURDAY   HYDROcodone-acetaminophen (NORCO/VICODIN) 5-325 MG tablet Take one half  to  one tablet by mouth once daily, as needed, for  Uncontrolled right  hip pain   lovastatin (MEVACOR) 40 MG tablet TAKE 1 TABLET EVERY DAY   methocarbamol (ROBAXIN) 500 MG tablet Take 1 tablet (500 mg total) by mouth every 6 (six) hours as needed for muscle spasms.   Multiple Vitamin (MULTIVITAMIN) tablet Take 1 tablet by mouth daily.   oxyCODONE (OXY IR/ROXICODONE) 5 MG immediate release tablet Take 1-2 tablets (5-10 mg total) by mouth every 6 (six) hours as needed for moderate pain (pain score 4-6).   spironolactone (ALDACTONE) 25 MG tablet Take half tablet by mouth every other day (Patient taking differently: Take 12.5 mg by mouth every other day. Take half tablet by mouth every other day)   No facility-administered encounter medications on file as of 05/26/2021.    Allergies (verified) Metformin and related   History: Past Medical History:  Diagnosis Date   Diabetes mellitus type II    Hyperlipidemia    Hypertension    Renal disorder    Past Surgical History:  Procedure Laterality Date   ABDOMINAL HYSTERECTOMY     COLONOSCOPY N/A 06/13/2015   Procedure: COLONOSCOPY;  Surgeon: Rogene Houston, MD;  Location[facility];  Service: Endoscopy;  Laterality: N/A;  930-rescheduled 8/26 @ 8:30am Ann to notify pt   HERNIA REPAIR     inguinal herniorrhapy right  right eye surgery secondary to right eye weakness     SALPINGOOPHORECTOMY     TOTAL HIP ARTHROPLASTY Right 01/06/2021   TOTAL HIP ARTHROPLASTY Right 01/06/2021   Procedure: RIGHT TOTAL HIP ARTHROPLASTY ANTERIOR APPROACH;  Surgeon: Mcarthur Rossetti, MD;  Location: [facility];  Service: Orthopedics;  Laterality: Right;   TUBAL LIGATION     Family History  Problem Relation Age of Onset   Pneumonia Brother        on continuous O2   Stroke Brother    Stroke Sister    COPD Sister    Diabetes Sister    Stroke Sister     Hypertension Mother    Diabetes Mother    Fibroids Daughter        uterine   Social History   Socioeconomic History   Marital status: Married    Spouse name: Not on file   Number of children: Not on file   Years of education: Not on file   Highest education level: High school graduate  Occupational History   Not on file  Tobacco Use   Smoking status: Never   Smokeless tobacco: Never  Vaping Use   Vaping Use: Never used  Substance and Sexual Activity   Alcohol use: No   Drug use: No   Sexual activity: Yes    Birth control/protection: Surgical  Other Topics Concern   Not on file  Social History Narrative   Not on file   Social Determinants of Health   Financial Resource Strain: Not on file  Food Insecurity: Not on file  Transportation Needs: Not on file  Physical Activity: Not on file  Stress: Not on file  Social Connections: Not on file    Tobacco Counseling Counseling given: Not Answered   Clinical Intake:                 Diabetic? Yes          Activities of Daily Living In your present state of health, do you have any difficulty performing the following activities: 01/07/2021 01/07/2021  Hearing? - N  Vision? - N  Difficulty concentrating or making decisions? - Y  Walking or climbing stairs? - Y  Dressing or bathing? - N  Doing errands, shopping? Y -  Some recent data might be hidden    Patient Care Team: Lindell Spar, MD as PCP - General (Internal Medicine) Herminio Commons, MD (Inactive) as PCP - Cardiology (Cardiology)  Indicate any recent Medical Services you may have received from other than [facility] providers in the past year (date may be approximate).     Assessment:   This is a routine wellness examination for [facility].  Hearing/Vision screen No results found.  Dietary issues and exercise activities discussed:     Goals Addressed   None   Depression Screen PHQ 2/9 Scores 04/30/2021 12/29/2020 08/18/2020 05/22/2020  04/07/2020 05/22/2019 05/14/2019  PHQ - 2 Score 0 0 0 0 0 0 0  PHQ- 9 Score - - - - - - -    Fall Risk Fall Risk  04/30/2021 12/29/2020 08/18/2020 05/22/2020 04/07/2020  Falls in the past year? 0 0 0 0 0  Number falls in past yr: 0 0 0 0 0  Injury with Fall? 0 0 0 0 0  Risk for fall due to : No Fall Risks No Fall Risks - No Fall Risks -  Follow up Falls evaluation completed Falls evaluation completed - Falls evaluation completed Falls evaluation completed  FALL RISK PREVENTION PERTAINING TO THE HOME:  Any stairs in or around the home? No  If so, are there any without handrails? No  Home free of loose throw rugs in walkways, pet beds, electrical cords, etc? Yes  Adequate lighting in your home to reduce risk of falls? Yes   ASSISTIVE DEVICES UTILIZED TO PREVENT FALLS:  Life alert? No  Use of a cane, walker or w/c? Yes  Grab bars in the bathroom? Yes  Shower chair or bench in shower? Yes  Elevated toilet seat or a handicapped toilet? Yes   TIMED UP AND GO:  Was the test performed? Yes .  Length of time to ambulate 10 feet: 30 sec.   Gait slow and steady without use of assistive device  Cognitive Function:     6CIT Screen 05/22/2020 05/22/2019 05/16/2018 05/02/2017  What Year? 0 points 0 points 0 points 0 points  What month? 0 points 0 points 0 points 0 points  What time? 0 points 0 points 0 points 0 points  Count back from 20 0 points 0 points 0 points 0 points  Months in reverse 0 points 0 points 0 points 0 points  Repeat phrase 0 points 0 points 2 points 0 points  Total Score 0 0 2 0    Immunizations Immunization History  Administered Date(s) Administered   Fluad Quad(high Dose 65+) 07/02/2019, 08/18/2020   Influenza Split 08/11/2011, 08/23/2012   Influenza, High Dose Seasonal PF 08/14/2018   Influenza,inj,Quad PF,6+ Mos 08/10/2013, 08/10/2014, 06/30/2015, 07/12/2016, 08/02/2017   Moderna Sars-Covid-2 Vaccination 01/22/2020, 02/19/2020   Pneumococcal Conjugate-13 05/07/2014    Pneumococcal Polysaccharide-23 09/11/2007, 08/11/2011   Tdap 04/15/2011   Zoster Recombinat (Shingrix) 05/02/2021    TDAP status: Due, Education has been provided regarding the importance of this vaccine. Advised may receive this vaccine at local pharmacy or Health Dept. Aware to provide a copy of the vaccination record if obtained from local pharmacy or Health Dept. Verbalized acceptance and understanding.  Flu Vaccine status: Up to date  Pneumococcal vaccine status: Up to date  Covid-19 vaccine status: Completed vaccines  Qualifies for Shingles Vaccine? Yes   Zostavax completed Yes   Shingrix Completed?: Yes  Screening Tests Health Maintenance  Topic Date Due   COVID-19 Vaccine (3 - Moderna risk series) 03/18/2020   TETANUS/TDAP  04/14/2021   INFLUENZA VACCINE  05/18/2021   Zoster Vaccines- Shingrix (2 of 2) 06/27/2021   HEMOGLOBIN A1C  10/28/2021   OPHTHALMOLOGY EXAM  12/29/2021   FOOT EXAM  04/30/2022   COLONOSCOPY (Pts 45-40yr Insurance coverage will need to be confirmed)  06/12/2025   DEXA SCAN  Completed   Hepatitis C Screening  Completed   PNA vac Low Risk Adult  Completed   HPV VACCINES  Aged Out    Health Maintenance  Health Maintenance Due  Topic Date Due   COVID-19 Vaccine (3 - Moderna risk series) 03/18/2020   TETANUS/TDAP  04/14/2021   INFLUENZA VACCINE  05/18/2021    Colorectal cancer screening: Type of screening: Cologuard. Completed normal. Repeat every 3 years  Mammogram status: Ordered 09/2021. Pt provided with contact info and advised to call to schedule appt.   Bone Density status: Completed normal. Results reflect: Bone density results: NORMAL. Repeat every 10 years.  Lung Cancer Screening: (Low Dose CT Chest recommended if Age 76-80years, 30 pack-year currently smoking OR have quit w/in 15years.) does not qualify.    Additional Screening:  Hepatitis C Screening: does qualify; Completed.   Vision Screening:  Recommended annual  ophthalmology exams for early detection of glaucoma and other disorders of the eye. Is the patient up to date with their annual eye exam?  Yes  Who is the provider or what is the name of the office in which the patient attends annual eye exams? Dr. Jorja Loa  If pt is not established with a provider, would they like to be referred to a provider to establish care? Yes .   Dental Screening: Recommended annual dental exams for proper oral hygiene  Community Resource Referral / Chronic Care Management: CRR required this visit?  No   CCM required this visit?  No      Plan:     I have personally reviewed and noted the following in the patient's chart:   Medical and social history Use of alcohol, tobacco or illicit drugs  Current medications and supplements including opioid prescriptions.  Functional ability and status Nutritional status Physical activity Advanced directives List of other physicians Hospitalizations, surgeries, and ER visits in previous 12 months Vitals Screenings to include cognitive, depression, and falls Referrals and appointments  In addition, I have reviewed and discussed with patient certain preventive protocols, quality metrics, and best practice recommendations. A written personalized care plan for preventive services as well as general preventive health recommendations were provided to patient.     Lonn Georgia, LPN   10/23/1094   Nurse Notes: AWV done in office with pt consent to face to face visit in the office. Provider off site at this time. This visit took approx 30 min.

## 2021-05-26 NOTE — Patient Instructions (Signed)
Mikayla Jenkins , Thank you for taking time to come for your Medicare Wellness Visit. I appreciate your ongoing commitment to your health goals. Please review the following plan we discussed and let me know if I can assist you in the future.   Screening recommendations/referrals: Colonoscopy: 06/12/2025 Mammogram: Scheduled for 09/2021 Bone Density: completed  Recommended yearly ophthalmology/optometry visit for glaucoma screening and checkup Recommended yearly dental visit for hygiene and checkup  Vaccinations: Influenza vaccine: Due Fall 2022 Pneumococcal vaccine: Completed  Tdap vaccine: DUE, check with local pharmacy for pricing.  Shingles vaccine: Completed   Advanced directives: N/A   Conditions/risks identified: Not at this time.   Next appointment: 08/31/21 @ 10:40am with Dr. Posey Pronto    Preventive Care 76 Years and Older, Female Preventive care refers to lifestyle choices and visits with your health care provider that can promote health and wellness. What does preventive care include? A yearly physical exam. This is also called an annual well check. Dental exams once or twice a year. Routine eye exams. Ask your health care provider how often you should have your eyes checked. Personal lifestyle choices, including: Daily care of your teeth and gums. Regular physical activity. Eating a healthy diet. Avoiding tobacco and drug use. Limiting alcohol use. Practicing safe sex. Taking low-dose aspirin every day. Taking vitamin and mineral supplements as recommended by your health care provider. What happens during an annual well check? The services and screenings done by your health care provider during your annual well check will depend on your age, overall health, lifestyle risk factors, and family history of disease. Counseling  Your health care provider may ask you questions about your: Alcohol use. Tobacco use. Drug use. Emotional well-being. Home and relationship  well-being. Sexual activity. Eating habits. History of falls. Memory and ability to understand (cognition). Work and work Statistician. Reproductive health. Screening  You may have the following tests or measurements: Height, weight, and BMI. Blood pressure. Lipid and cholesterol levels. These may be checked every 5 years, or more frequently if you are over 37 years old. Skin check. Lung cancer screening. You may have this screening every year starting at age 51 if you have a 30-pack-year history of smoking and currently smoke or have quit within the past 15 years. Fecal occult blood test (FOBT) of the stool. You may have this test every year starting at age 43. Flexible sigmoidoscopy or colonoscopy. You may have a sigmoidoscopy every 5 years or a colonoscopy every 10 years starting at age 52. Hepatitis C blood test. Hepatitis B blood test. Sexually transmitted disease (STD) testing. Diabetes screening. This is done by checking your blood sugar (glucose) after you have not eaten for a while (fasting). You may have this done every 1-3 years. Bone density scan. This is done to screen for osteoporosis. You may have this done starting at age 38. Mammogram. This may be done every 1-2 years. Talk to your health care provider about how often you should have regular mammograms. Talk with your health care provider about your test results, treatment options, and if necessary, the need for more tests. Vaccines  Your health care provider may recommend certain vaccines, such as: Influenza vaccine. This is recommended every year. Tetanus, diphtheria, and acellular pertussis (Tdap, Td) vaccine. You may need a Td booster every 10 years. Zoster vaccine. You may need this after age 87. Pneumococcal 13-valent conjugate (PCV13) vaccine. One dose is recommended after age 26. Pneumococcal polysaccharide (PPSV23) vaccine. One dose is recommended after age 16.  Talk to your health care provider about which  screenings and vaccines you need and how often you need them. This information is not intended to replace advice given to you by your health care provider. Make sure you discuss any questions you have with your health care provider. Document Released: 10/31/2015 Document Revised: 06/23/2016 Document Reviewed: 08/05/2015 Elsevier Interactive Patient Education  2017 [place] Prevention in the Home Falls can cause injuries. They can happen to people of all ages. There are many things you can do to make your home safe and to help prevent falls. What can I do on the outside of my home? Regularly fix the edges of walkways and driveways and fix any cracks. Remove anything that might make you trip as you walk through a door, such as a raised step or threshold. Trim any bushes or trees on the path to your home. Use bright outdoor lighting. Clear any walking paths of anything that might make someone trip, such as rocks or tools. Regularly check to see if handrails are loose or broken. Make sure that both sides of any steps have handrails. Any raised decks and porches should have guardrails on the edges. Have any leaves, snow, or ice cleared regularly. Use sand or salt on walking paths during winter. Clean up any spills in your garage right away. This includes oil or grease spills. What can I do in the bathroom? Use night lights. Install grab bars by the toilet and in the tub and shower. Do not use towel bars as grab bars. Use non-skid mats or decals in the tub or shower. If you need to sit down in the shower, use a plastic, non-slip stool. Keep the floor dry. Clean up any water that spills on the floor as soon as it happens. Remove soap buildup in the tub or shower regularly. Attach bath mats securely with double-sided non-slip rug tape. Do not have throw rugs and other things on the floor that can make you trip. What can I do in the bedroom? Use night lights. Make sure that you have a  light by your bed that is easy to reach. Do not use any sheets or blankets that are too big for your bed. They should not hang down onto the floor. Have a firm chair that has side arms. You can use this for support while you get dressed. Do not have throw rugs and other things on the floor that can make you trip. What can I do in the kitchen? Clean up any spills right away. Avoid walking on wet floors. Keep items that you use a lot in easy-to-reach places. If you need to reach something above you, use a strong step stool that has a grab bar. Keep electrical cords out of the way. Do not use floor polish or wax that makes floors slippery. If you must use wax, use non-skid floor wax. Do not have throw rugs and other things on the floor that can make you trip. What can I do with my stairs? Do not leave any items on the stairs. Make sure that there are handrails on both sides of the stairs and use them. Fix handrails that are broken or loose. Make sure that handrails are as long as the stairways. Check any carpeting to make sure that it is firmly attached to the stairs. Fix any carpet that is loose or worn. Avoid having throw rugs at the top or bottom of the stairs. If you do have throw  rugs, attach them to the floor with carpet tape. Make sure that you have a light switch at the top of the stairs and the bottom of the stairs. If you do not have them, ask someone to add them for you. What else can I do to help prevent falls? Wear shoes that: Do not have high heels. Have rubber bottoms. Are comfortable and fit you well. Are closed at the toe. Do not wear sandals. If you use a stepladder: Make sure that it is fully opened. Do not climb a closed stepladder. Make sure that both sides of the stepladder are locked into place. Ask someone to hold it for you, if possible. Clearly mark and make sure that you can see: Any grab bars or handrails. First and last steps. Where the edge of each step  is. Use tools that help you move around (mobility aids) if they are needed. These include: Canes. Walkers. Scooters. Crutches. Turn on the lights when you go into a dark area. Replace any light bulbs as soon as they burn out. Set up your furniture so you have a clear path. Avoid moving your furniture around. If any of your floors are uneven, fix them. If there are any pets around you, be aware of where they are. Review your medicines with your doctor. Some medicines can make you feel dizzy. This can increase your chance of falling. Ask your doctor what other things that you can do to help prevent falls. This information is not intended to replace advice given to you by your health care provider. Make sure you discuss any questions you have with your health care provider. Document Released: 07/31/2009 Document Revised: 03/11/2016 Document Reviewed: 11/08/2014 Elsevier Interactive Patient Education  2017 [employer].

## 2021-06-10 ENCOUNTER — Other Ambulatory Visit: Payer: Self-pay | Admitting: Family Medicine

## 2021-06-11 ENCOUNTER — Other Ambulatory Visit: Payer: Self-pay | Admitting: Family Medicine

## 2021-06-11 DIAGNOSIS — E785 Hyperlipidemia, unspecified: Secondary | ICD-10-CM

## 2021-06-11 DIAGNOSIS — I10 Essential (primary) hypertension: Secondary | ICD-10-CM

## 2021-08-05 DIAGNOSIS — E1122 Type 2 diabetes mellitus with diabetic chronic kidney disease: Secondary | ICD-10-CM | POA: Diagnosis not present

## 2021-08-05 DIAGNOSIS — D638 Anemia in other chronic diseases classified elsewhere: Secondary | ICD-10-CM | POA: Diagnosis not present

## 2021-08-05 DIAGNOSIS — E871 Hypo-osmolality and hyponatremia: Secondary | ICD-10-CM | POA: Diagnosis not present

## 2021-08-05 DIAGNOSIS — I5032 Chronic diastolic (congestive) heart failure: Secondary | ICD-10-CM | POA: Diagnosis not present

## 2021-08-05 DIAGNOSIS — N189 Chronic kidney disease, unspecified: Secondary | ICD-10-CM | POA: Diagnosis not present

## 2021-08-05 DIAGNOSIS — I129 Hypertensive chronic kidney disease with stage 1 through stage 4 chronic kidney disease, or unspecified chronic kidney disease: Secondary | ICD-10-CM | POA: Diagnosis not present

## 2021-08-20 ENCOUNTER — Ambulatory Visit (INDEPENDENT_AMBULATORY_CARE_PROVIDER_SITE_OTHER): Payer: Medicare HMO

## 2021-08-20 ENCOUNTER — Encounter: Payer: Self-pay | Admitting: Orthopaedic Surgery

## 2021-08-20 ENCOUNTER — Ambulatory Visit: Payer: Medicare HMO | Admitting: Orthopaedic Surgery

## 2021-08-20 DIAGNOSIS — Z96641 Presence of right artificial hip joint: Secondary | ICD-10-CM | POA: Diagnosis not present

## 2021-08-20 DIAGNOSIS — M2170 Unequal limb length (acquired), unspecified site: Secondary | ICD-10-CM | POA: Diagnosis not present

## 2021-08-20 NOTE — Progress Notes (Signed)
The patient comes in today at between 7 and 8 months status post a right total hip arthroplasty.  She is 75 years old and doing well overall.  She has occasional aches and pains.  She reports good range of motion and strength.  There is a leg length discrepancy.  Her left side shorter than the right.  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  That was a preoperative diagnosis of the protrusio.  On exam both hips move smoothly and fluidly.  She has no issues with her left hip or her right operative hip.  An AP pelvis and lateral of both right hip today shows a well-seated total hip arthroplasty with no complicating features.  From my standpoint follow-up can be as needed.  I did give her a new prescription to take to a prosthetic company for shoe lift for her left side.  All questions and concerns were answered addressed.  Follow-up is as needed.

## 2021-08-25 DIAGNOSIS — E1121 Type 2 diabetes mellitus with diabetic nephropathy: Secondary | ICD-10-CM | POA: Diagnosis not present

## 2021-08-25 DIAGNOSIS — E785 Hyperlipidemia, unspecified: Secondary | ICD-10-CM | POA: Diagnosis not present

## 2021-08-25 DIAGNOSIS — N1832 Chronic kidney disease, stage 3b: Secondary | ICD-10-CM | POA: Diagnosis not present

## 2021-08-25 DIAGNOSIS — I10 Essential (primary) hypertension: Secondary | ICD-10-CM | POA: Diagnosis not present

## 2021-08-26 DIAGNOSIS — I5032 Chronic diastolic (congestive) heart failure: Secondary | ICD-10-CM | POA: Diagnosis not present

## 2021-08-26 DIAGNOSIS — E871 Hypo-osmolality and hyponatremia: Secondary | ICD-10-CM | POA: Diagnosis not present

## 2021-08-26 DIAGNOSIS — D638 Anemia in other chronic diseases classified elsewhere: Secondary | ICD-10-CM | POA: Diagnosis not present

## 2021-08-26 DIAGNOSIS — N189 Chronic kidney disease, unspecified: Secondary | ICD-10-CM | POA: Diagnosis not present

## 2021-08-26 DIAGNOSIS — I129 Hypertensive chronic kidney disease with stage 1 through stage 4 chronic kidney disease, or unspecified chronic kidney disease: Secondary | ICD-10-CM | POA: Diagnosis not present

## 2021-08-26 DIAGNOSIS — E1122 Type 2 diabetes mellitus with diabetic chronic kidney disease: Secondary | ICD-10-CM | POA: Diagnosis not present

## 2021-08-26 LAB — CBC WITH DIFFERENTIAL/PLATELET
Basophils Absolute: 0 10*3/uL (ref 0.0–0.2)
Basos: 1 %
EOS (ABSOLUTE): 0.1 10*3/uL (ref 0.0–0.4)
Eos: 3 %
Hematocrit: 33.1 % — ABNORMAL LOW (ref 34.0–46.6)
Hemoglobin: 11.5 g/dL (ref 11.1–15.9)
Immature Grans (Abs): 0 10*3/uL (ref 0.0–0.1)
Immature Granulocytes: 0 %
Lymphocytes Absolute: 1.4 10*3/uL (ref 0.7–3.1)
Lymphs: 31 %
MCH: 31.8 pg (ref 26.6–33.0)
MCHC: 34.7 g/dL (ref 31.5–35.7)
MCV: 91 fL (ref 79–97)
Monocytes Absolute: 0.6 10*3/uL (ref 0.1–0.9)
Monocytes: 12 %
Neutrophils Absolute: 2.5 10*3/uL (ref 1.4–7.0)
Neutrophils: 53 %
Platelets: 207 10*3/uL (ref 150–450)
RBC: 3.62 x10E6/uL — ABNORMAL LOW (ref 3.77–5.28)
RDW: 11.9 % (ref 11.7–15.4)
WBC: 4.6 10*3/uL (ref 3.4–10.8)

## 2021-08-26 LAB — CMP14+EGFR
ALT: 15 IU/L (ref 0–32)
AST: 21 IU/L (ref 0–40)
Albumin/Globulin Ratio: 2.5 — ABNORMAL HIGH (ref 1.2–2.2)
Albumin: 4.7 g/dL (ref 3.7–4.7)
Alkaline Phosphatase: 111 IU/L (ref 44–121)
BUN/Creatinine Ratio: 19 (ref 12–28)
BUN: 27 mg/dL (ref 8–27)
Bilirubin Total: 0.7 mg/dL (ref 0.0–1.2)
CO2: 25 mmol/L (ref 20–29)
Calcium: 9.6 mg/dL (ref 8.7–10.3)
Chloride: 99 mmol/L (ref 96–106)
Creatinine, Ser: 1.4 mg/dL — ABNORMAL HIGH (ref 0.57–1.00)
Globulin, Total: 1.9 g/dL (ref 1.5–4.5)
Glucose: 194 mg/dL — ABNORMAL HIGH (ref 70–99)
Potassium: 4.7 mmol/L (ref 3.5–5.2)
Sodium: 136 mmol/L (ref 134–144)
Total Protein: 6.6 g/dL (ref 6.0–8.5)
eGFR: 39 mL/min/{1.73_m2} — ABNORMAL LOW (ref >59)

## 2021-08-26 LAB — HEMOGLOBIN A1C
Est. average glucose Bld gHb Est-mCnc: 151 mg/dL
Hgb A1c MFr Bld: 6.9 % — ABNORMAL HIGH (ref 4.8–5.6)

## 2021-08-26 LAB — LIPID PANEL
Chol/HDL Ratio: 1.9 ratio (ref 0.0–4.4)
Cholesterol, Total: 201 mg/dL — ABNORMAL HIGH (ref 100–199)
HDL: 105 mg/dL (ref >39)
LDL Chol Calc (NIH): 80 mg/dL (ref 0–99)
Triglycerides: 92 mg/dL (ref 0–149)
VLDL Cholesterol Cal: 16 mg/dL (ref 5–40)

## 2021-08-31 ENCOUNTER — Ambulatory Visit: Payer: Medicare HMO | Admitting: Internal Medicine

## 2021-09-01 ENCOUNTER — Ambulatory Visit (INDEPENDENT_AMBULATORY_CARE_PROVIDER_SITE_OTHER): Payer: Medicare HMO | Admitting: Family Medicine

## 2021-09-01 ENCOUNTER — Other Ambulatory Visit: Payer: Self-pay

## 2021-09-01 ENCOUNTER — Encounter: Payer: Self-pay | Admitting: Family Medicine

## 2021-09-01 VITALS — BP 130/70 | HR 82 | Resp 17 | Ht 67.0 in | Wt 153.1 lb

## 2021-09-01 DIAGNOSIS — Z23 Encounter for immunization: Secondary | ICD-10-CM | POA: Diagnosis not present

## 2021-09-01 DIAGNOSIS — I10 Essential (primary) hypertension: Secondary | ICD-10-CM | POA: Diagnosis not present

## 2021-09-01 DIAGNOSIS — E559 Vitamin D deficiency, unspecified: Secondary | ICD-10-CM | POA: Diagnosis not present

## 2021-09-01 DIAGNOSIS — E1121 Type 2 diabetes mellitus with diabetic nephropathy: Secondary | ICD-10-CM

## 2021-09-01 DIAGNOSIS — E785 Hyperlipidemia, unspecified: Secondary | ICD-10-CM | POA: Diagnosis not present

## 2021-09-01 DIAGNOSIS — M1611 Unilateral primary osteoarthritis, right hip: Secondary | ICD-10-CM | POA: Diagnosis not present

## 2021-09-01 NOTE — Assessment & Plan Note (Signed)
Mikayla Jenkins is reminded of the importance of commitment to daily physical activity for 30 minutes or more, as able and the need to limit carbohydrate intake to 30 to 60 grams per meal to help with blood sugar control.   The need to take medication as prescribed, test blood sugar as directed, and to call between visits if there is a concern that blood sugar is uncontrolled is also discussed.   Mikayla Jenkins is reminded of the importance of daily foot exam, annual eye examination, and good blood sugar, blood pressure and cholesterol control.  Diabetic Labs Latest Ref Rng & Units 08/25/2021 04/27/2021 01/07/2021 12/25/2020 08/13/2020  HbA1c 4.8 - 5.6 % 6.9(H) 6.5(H) - 7.4(H) 7.3(H)  Microalbumin mg/dL - - - - 0.6  Micro/Creat Ratio <30 mcg/mg creat - - - - -  Chol 100 - 199 mg/dL 201(H) - - 212(H) 209(H)  HDL >39 mg/dL 105 - - 109 100  Calc LDL 0 - 99 mg/dL 80 - - 88 89  Triglycerides 0 - 149 mg/dL 92 - - 88 107  Creatinine 0.57 - 1.00 mg/dL 1.40(H) 1.29(H) 1.13(H) 1.28(H) 1.38(H)   BP/Weight 09/01/2021 05/26/2021 04/30/2021 01/08/2021 01/06/2021 01/02/2021 3/54/6568  Systolic BP 127 517 001 749 - 449 675  Diastolic BP 70 72 72 60 - 76 74  Wt. (Lbs) 153.12 147 149 - 152.44 152.44 155  BMI 23.98 23.02 23.34 - 23.87 23.88 24.28   Foot/eye exam completion dates Latest Ref Rng & Units 12/29/2020 12/25/2020  Eye Exam No Retinopathy No Retinopathy No Retinopathy  Foot Form Completion - - -

## 2021-09-01 NOTE — Assessment & Plan Note (Signed)
Hyperlipidemia:Low fat diet discussed and encouraged.   Lipid Panel  Lab Results  Component Value Date   CHOL 201 (H) 08/25/2021   HDL 105 08/25/2021   LDLCALC 80 08/25/2021   TRIG 92 08/25/2021   CHOLHDL 1.9 08/25/2021  need to reduce fried and fatty foods

## 2021-09-01 NOTE — Progress Notes (Signed)
[name]     MRN: [id number]      DOB: 10/15/1946   HPI [name] is here for follow up and re-evaluation of chronic medical conditions, medication management and review of any available recent lab and radiology data.  Preventive health is updated, specifically  Cancer screening and Immunization.   Questions or concerns regarding consultations or procedures which the PT has had in the interim are  addressed. The PT denies any adverse reactions to current medications since the last visit.  There are no new concerns.  There are no specific complaints   ROS Denies recent fever or chills. Denies sinus pressure, nasal congestion, ear pain or sore throat. Denies chest congestion, productive cough or wheezing. Denies chest pains, palpitations and leg swelling Denies abdominal pain, nausea, vomiting,diarrhea or constipation.   Denies dysuria, frequency, hesitancy or incontinence. Denies joint pain, swelling and limitation in mobility. Denies headaches, seizures, numbness, or tingling. Denies depression, anxiety or insomnia. Denies skin break down or rash.   PE  BP 130/70   Pulse 82   Resp 17   Ht 5\' 7"  (1.702 m)   Wt 153 lb 1.9 oz (69.5 kg)   SpO2 97%   BMI 23.98 kg/m   Patient alert and oriented and in no cardiopulmonary distress.  HEENT: No facial asymmetry, EOMI,     Neck supple .  Chest: Clear to auscultation bilaterally.  CVS: S1, S2 no murmurs, no S3.Regular rate.  ABD: Soft non tender.   Ext: No edema  MS: Adequate ROM spine, shoulders, hips and knees.  Skin: Intact, no ulcerations or rash noted.  Psych: Good eye contact, normal affect. Memory intact not anxious or depressed appearing.  CNS: CN 2-12 intact, power,  normal throughout.no focal deficits noted.   Assessment & Plan Malignant hypertension Controlled, no change in medication DASH diet and commitment to daily physical activity for a minimum of 30 minutes discussed and encouraged, as a part of  hypertension management. The importance of attaining a healthy weight is also discussed.  BP/Weight 09/01/2021 05/26/2021 04/30/2021 01/08/2021 01/06/2021 01/02/2021 1/75/1025  Systolic BP 852 778 242 353 - 614 431  Diastolic BP 70 72 72 60 - 76 74  Wt. (Lbs) 153.12 147 149 - 152.44 152.44 155  BMI 23.98 23.02 23.34 - 23.87 23.88 24.28       Hyperlipidemia LDL goal <100 Hyperlipidemia:Low fat diet discussed and encouraged.   Lipid Panel  Lab Results  Component Value Date   CHOL 201 (H) 08/25/2021   HDL 105 08/25/2021   LDLCALC 80 08/25/2021   TRIG 92 08/25/2021   CHOLHDL 1.9 08/25/2021  need to reduce fried and fatty foods     Type 2 diabetes mellitus with nephropathy ([place]) Mikayla Jenkins is reminded of the importance of commitment to daily physical activity for 30 minutes or more, as able and the need to limit carbohydrate intake to 30 to 60 grams per meal to help with blood sugar control.   The need to take medication as prescribed, test blood sugar as directed, and to call between visits if there is a concern that blood sugar is uncontrolled is also discussed.   Mikayla Jenkins is reminded of the importance of daily foot exam, annual eye examination, and good blood sugar, blood pressure and cholesterol control.  Diabetic Labs Latest Ref Rng & Units 08/25/2021 04/27/2021 01/07/2021 12/25/2020 08/13/2020  HbA1c 4.8 - 5.6 % 6.9(H) 6.5(H) - 7.4(H) 7.3(H)  Microalbumin mg/dL - - - - 0.6  Micro/Creat Ratio <30 mcg/mg creat - - - - -  Chol 100 - 199 mg/dL 201(H) - - 212(H) 209(H)  HDL >39 mg/dL 105 - - 109 100  Calc LDL 0 - 99 mg/dL 80 - - 88 89  Triglycerides 0 - 149 mg/dL 92 - - 88 107  Creatinine 0.57 - 1.00 mg/dL 1.40(H) 1.29(H) 1.13(H) 1.28(H) 1.38(H)   BP/Weight 09/01/2021 05/26/2021 04/30/2021 01/08/2021 01/06/2021 01/02/2021 4/84/7207  Systolic BP 218 288 337 445 - 146 047  Diastolic BP 70 72 72 60 - 76 74  Wt. (Lbs) 153.12 147 149 - 152.44 152.44 155  BMI 23.98 23.02 23.34 - 23.87 23.88  24.28   Foot/eye exam completion dates Latest Ref Rng & Units 12/29/2020 12/25/2020  Eye Exam No Retinopathy No Retinopathy No Retinopathy  Foot Form Completion - - -        Osteoarthritis of right hip Stable, all precautions discussed briefly

## 2021-09-01 NOTE — Assessment & Plan Note (Signed)
Controlled, no change in medication DASH diet and commitment to daily physical activity for a minimum of 30 minutes discussed and encouraged, as a part of hypertension management. The importance of attaining a healthy weight is also discussed.  BP/Weight 09/01/2021 05/26/2021 04/30/2021 01/08/2021 01/06/2021 01/02/2021 3/88/7195  Systolic BP 974 718 550 158 - 682 574  Diastolic BP 70 72 72 60 - 76 74  Wt. (Lbs) 153.12 147 149 - 152.44 152.44 155  BMI 23.98 23.02 23.34 - 23.87 23.88 24.28

## 2021-09-01 NOTE — Assessment & Plan Note (Signed)
Stable, all precautions discussed briefly

## 2021-09-01 NOTE — Patient Instructions (Addendum)
F/U in 4 months,  call if you need me sooner  Flu vaccine today  Covid booster is due please get this ( OK to get today at your pharmacy if able)  Shingrix vaccines are recommended, please get at your pharmacy  Excellent BP and labs, no medication changes  Please schedule mammogram at checkout  It is important that you exercise regularly at least 30 minutes 5 times a week. If you develop chest pain, have severe difficulty breathing, or feel very tired, stop exercising immediately and seek medical attention   Non fasting cBC, vit D, hBA1C, chem 7 and EGFR 3 days before next appointment and vit D  Thanks for choosing [facility], we consider it a privelige to serve you.

## 2021-09-15 ENCOUNTER — Other Ambulatory Visit: Payer: Self-pay | Admitting: Family Medicine

## 2021-09-21 ENCOUNTER — Telehealth: Payer: Self-pay | Admitting: Family Medicine

## 2021-09-21 ENCOUNTER — Other Ambulatory Visit: Payer: Self-pay | Admitting: *Deleted

## 2021-09-21 MED ORDER — CLONIDINE HCL 0.3 MG PO TABS: ORAL_TABLET | ORAL | 0 refills | Status: DC

## 2021-09-21 MED ORDER — AMLODIPINE BESYLATE 10 MG PO TABS: 10.0000 mg | ORAL_TABLET | Freq: Every day | ORAL | 0 refills | Status: DC

## 2021-09-21 NOTE — Telephone Encounter (Signed)
Pt came by the office to see if she could get a weeks worth of  refill on  cloNIDine (CATAPRES) 0.3 MG tablet   amLODipine (NORVASC) 10 MG tablet  Pt states that she has already called refills  in to [place] she has not received them via mail yet and is out of meds    Mikayla Jenkins

## 2021-09-21 NOTE — Telephone Encounter (Signed)
Rx's were sent into [place] for a weeks supply

## 2021-10-14 ENCOUNTER — Ambulatory Visit (HOSPITAL_COMMUNITY)
Admission: RE | Admit: 2021-10-14 | Discharge: 2021-10-14 | Disposition: A | Discharge status: Home / Self Care | Payer: Medicare HMO | Source: Ambulatory Visit | Attending: Family Medicine | Admitting: Family Medicine

## 2021-10-14 ENCOUNTER — Other Ambulatory Visit: Payer: Self-pay

## 2021-10-14 DIAGNOSIS — Z1231 Encounter for screening mammogram for malignant neoplasm of breast: Secondary | ICD-10-CM | POA: Insufficient documentation

## 2021-11-26 DIAGNOSIS — N189 Chronic kidney disease, unspecified: Secondary | ICD-10-CM | POA: Diagnosis not present

## 2021-11-26 DIAGNOSIS — I129 Hypertensive chronic kidney disease with stage 1 through stage 4 chronic kidney disease, or unspecified chronic kidney disease: Secondary | ICD-10-CM | POA: Diagnosis not present

## 2021-11-26 DIAGNOSIS — E1122 Type 2 diabetes mellitus with diabetic chronic kidney disease: Secondary | ICD-10-CM | POA: Diagnosis not present

## 2021-11-26 DIAGNOSIS — E871 Hypo-osmolality and hyponatremia: Secondary | ICD-10-CM | POA: Diagnosis not present

## 2021-11-26 DIAGNOSIS — I5032 Chronic diastolic (congestive) heart failure: Secondary | ICD-10-CM | POA: Diagnosis not present

## 2021-11-26 DIAGNOSIS — D638 Anemia in other chronic diseases classified elsewhere: Secondary | ICD-10-CM | POA: Diagnosis not present

## 2021-12-02 DIAGNOSIS — I129 Hypertensive chronic kidney disease with stage 1 through stage 4 chronic kidney disease, or unspecified chronic kidney disease: Secondary | ICD-10-CM | POA: Diagnosis not present

## 2021-12-02 DIAGNOSIS — E1122 Type 2 diabetes mellitus with diabetic chronic kidney disease: Secondary | ICD-10-CM | POA: Diagnosis not present

## 2021-12-02 DIAGNOSIS — I5032 Chronic diastolic (congestive) heart failure: Secondary | ICD-10-CM | POA: Diagnosis not present

## 2021-12-02 DIAGNOSIS — N189 Chronic kidney disease, unspecified: Secondary | ICD-10-CM | POA: Diagnosis not present

## 2021-12-03 DIAGNOSIS — N189 Chronic kidney disease, unspecified: Secondary | ICD-10-CM | POA: Diagnosis not present

## 2021-12-03 DIAGNOSIS — I129 Hypertensive chronic kidney disease with stage 1 through stage 4 chronic kidney disease, or unspecified chronic kidney disease: Secondary | ICD-10-CM | POA: Diagnosis not present

## 2021-12-03 DIAGNOSIS — I5032 Chronic diastolic (congestive) heart failure: Secondary | ICD-10-CM | POA: Diagnosis not present

## 2021-12-03 DIAGNOSIS — E1122 Type 2 diabetes mellitus with diabetic chronic kidney disease: Secondary | ICD-10-CM | POA: Diagnosis not present

## 2021-12-12 IMAGING — RF DG C-ARM 1-60 MIN
1 series · 4 of 4 positions shown · non-contrast
Comparison: None.

CLINICAL DATA: Right hip arthroplasty

EXAM:
OPERATIVE RIGHT HIP WITH PELVIS; DG C-ARM 1-60 MIN

[Series 1: unknown protocol · 0.20mm/px · 4 of 4 slices shown]
[im 1/4]
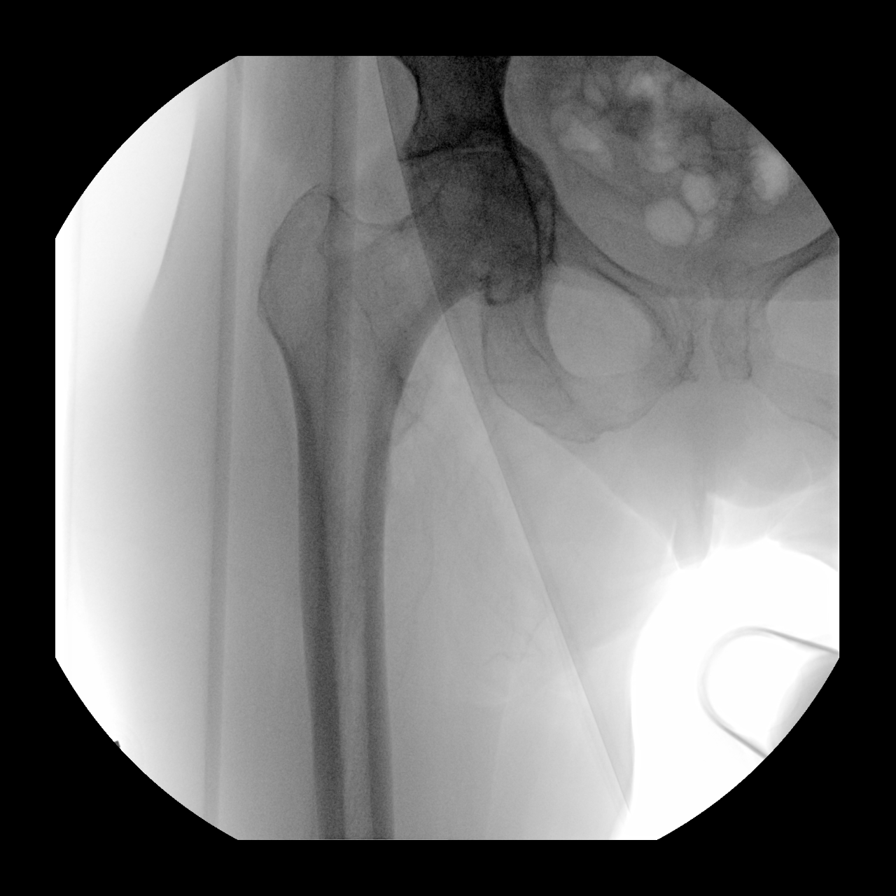
[im 2/4]
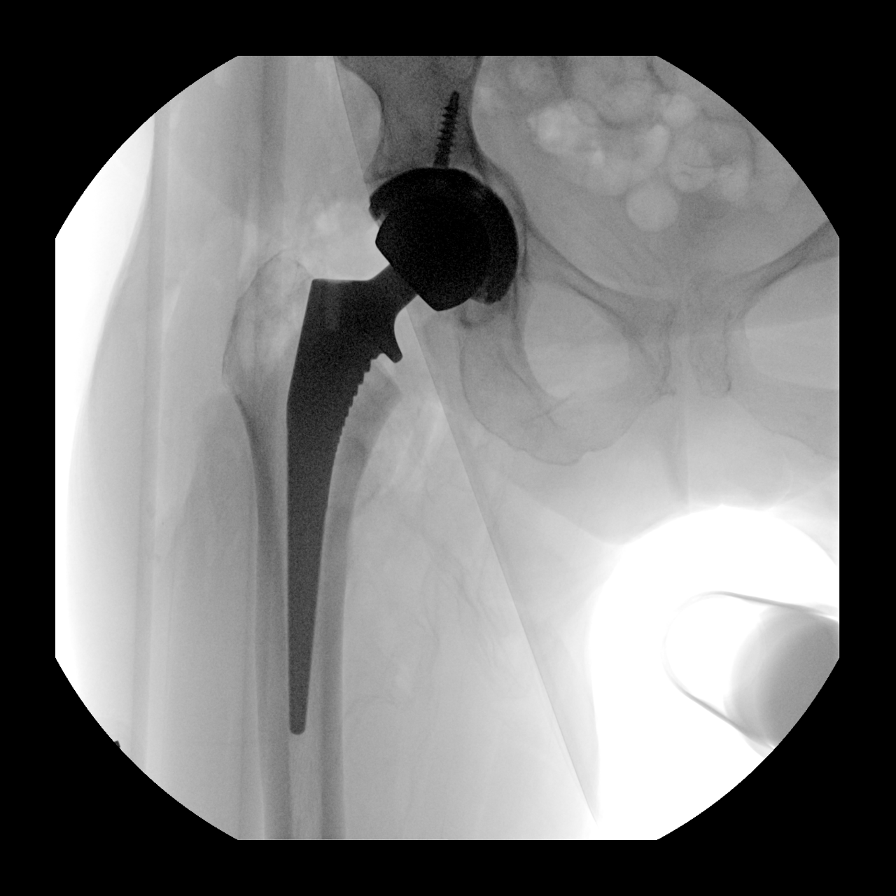
[im 3/4]
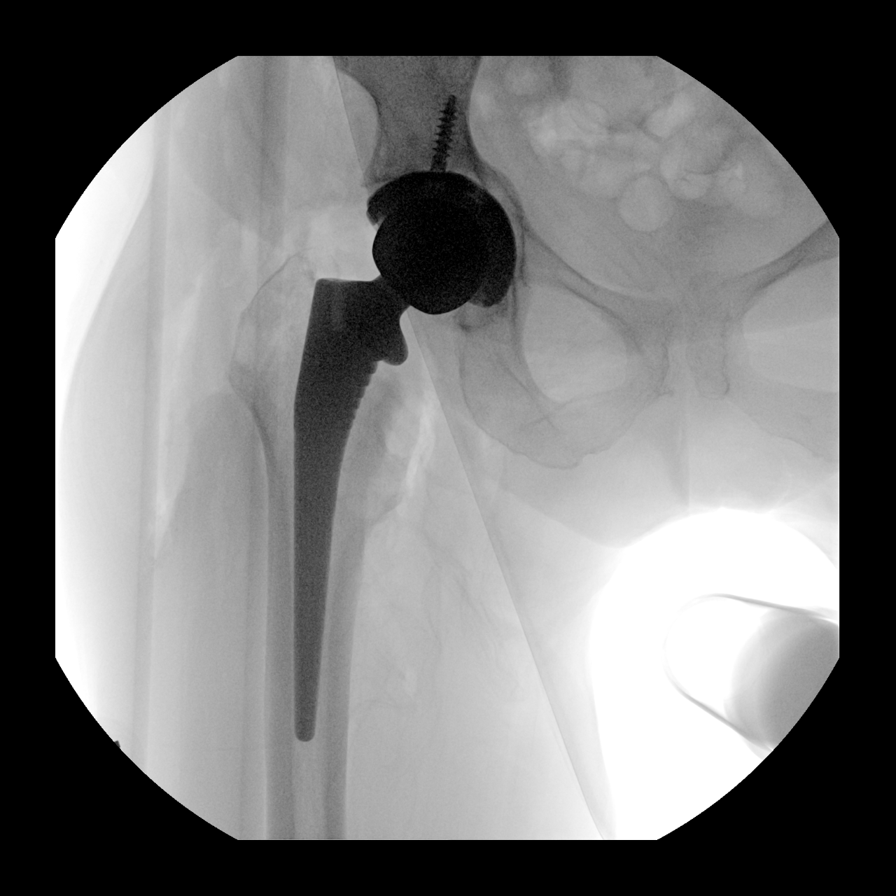
[im 4/4]
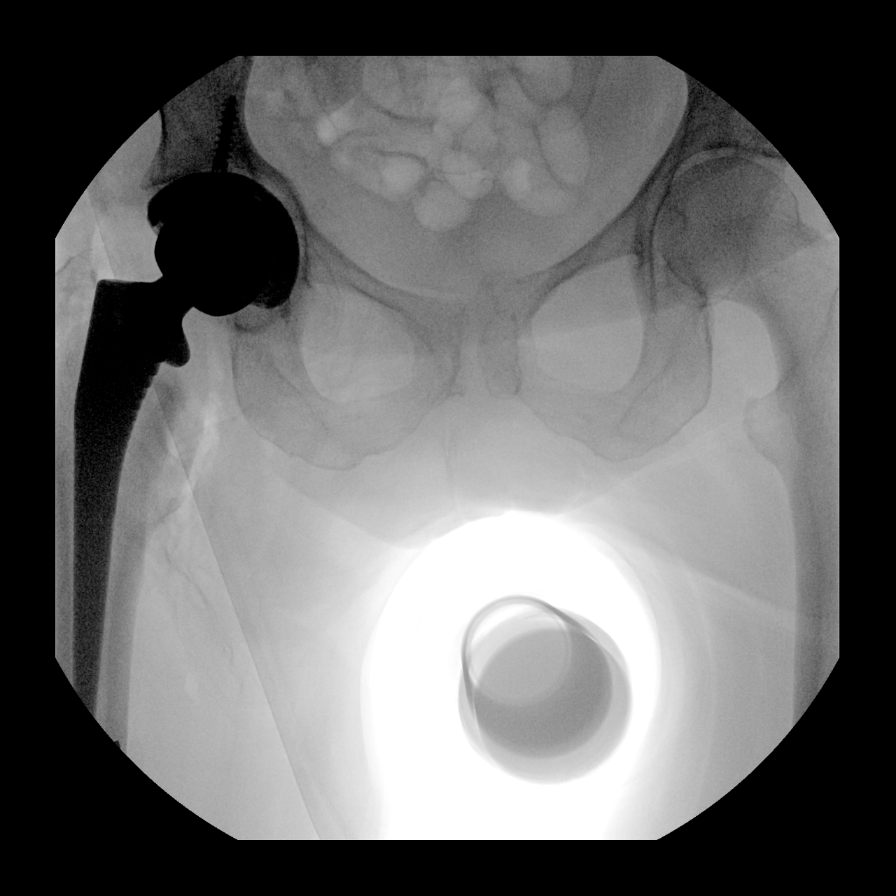

[4 of 4 positions shown; findings below may reference images not displayed]

FLUOROSCOPY TIME:  Radiation Exposure Index (as provided by the
fluoroscopic device): 1.89 mGy

If the device does not provide the exposure index:

Fluoroscopy Time:  23 seconds

Number of Acquired Images:  4
FINDINGS: Initial images again demonstrate significant degenerative change of
the right hip joint with acetabular protrusio. Subsequent films
demonstrate right hip replacement in satisfactory position. No acute
soft tissue abnormality is noted.
IMPRESSION: Right hip replacement.

## 2021-12-15 ENCOUNTER — Other Ambulatory Visit: Payer: Self-pay | Admitting: Family Medicine

## 2021-12-17 DIAGNOSIS — I5032 Chronic diastolic (congestive) heart failure: Secondary | ICD-10-CM | POA: Diagnosis not present

## 2021-12-17 DIAGNOSIS — E871 Hypo-osmolality and hyponatremia: Secondary | ICD-10-CM | POA: Diagnosis not present

## 2021-12-17 DIAGNOSIS — E1122 Type 2 diabetes mellitus with diabetic chronic kidney disease: Secondary | ICD-10-CM | POA: Diagnosis not present

## 2021-12-17 DIAGNOSIS — N189 Chronic kidney disease, unspecified: Secondary | ICD-10-CM | POA: Diagnosis not present

## 2021-12-17 DIAGNOSIS — I129 Hypertensive chronic kidney disease with stage 1 through stage 4 chronic kidney disease, or unspecified chronic kidney disease: Secondary | ICD-10-CM | POA: Diagnosis not present

## 2021-12-18 DIAGNOSIS — N189 Chronic kidney disease, unspecified: Secondary | ICD-10-CM | POA: Diagnosis not present

## 2021-12-18 DIAGNOSIS — I5032 Chronic diastolic (congestive) heart failure: Secondary | ICD-10-CM | POA: Diagnosis not present

## 2021-12-18 DIAGNOSIS — E1122 Type 2 diabetes mellitus with diabetic chronic kidney disease: Secondary | ICD-10-CM | POA: Diagnosis not present

## 2021-12-18 DIAGNOSIS — I129 Hypertensive chronic kidney disease with stage 1 through stage 4 chronic kidney disease, or unspecified chronic kidney disease: Secondary | ICD-10-CM | POA: Diagnosis not present

## 2021-12-25 DIAGNOSIS — N1832 Chronic kidney disease, stage 3b: Secondary | ICD-10-CM | POA: Diagnosis not present

## 2021-12-25 DIAGNOSIS — E785 Hyperlipidemia, unspecified: Secondary | ICD-10-CM | POA: Diagnosis not present

## 2021-12-25 DIAGNOSIS — E1121 Type 2 diabetes mellitus with diabetic nephropathy: Secondary | ICD-10-CM | POA: Diagnosis not present

## 2021-12-25 DIAGNOSIS — E559 Vitamin D deficiency, unspecified: Secondary | ICD-10-CM | POA: Diagnosis not present

## 2021-12-25 DIAGNOSIS — I10 Essential (primary) hypertension: Secondary | ICD-10-CM | POA: Diagnosis not present

## 2021-12-26 LAB — CBC
Hematocrit: 36.1 % (ref 34.0–46.6)
Hemoglobin: 12.3 g/dL (ref 11.1–15.9)
MCH: 31.9 pg (ref 26.6–33.0)
MCHC: 34.1 g/dL (ref 31.5–35.7)
MCV: 94 fL (ref 79–97)
Platelets: 218 10*3/uL (ref 150–450)
RBC: 3.86 x10E6/uL (ref 3.77–5.28)
RDW: 11.8 % (ref 11.7–15.4)
WBC: 4.5 10*3/uL (ref 3.4–10.8)

## 2021-12-26 LAB — BMP8+EGFR
BUN/Creatinine Ratio: 13 (ref 12–28)
BUN: 19 mg/dL (ref 8–27)
CO2: 23 mmol/L (ref 20–29)
Calcium: 10.1 mg/dL (ref 8.7–10.3)
Chloride: 96 mmol/L (ref 96–106)
Creatinine, Ser: 1.41 mg/dL — ABNORMAL HIGH (ref 0.57–1.00)
Glucose: 246 mg/dL — ABNORMAL HIGH (ref 70–99)
Potassium: 4.6 mmol/L (ref 3.5–5.2)
Sodium: 134 mmol/L (ref 134–144)
eGFR: 39 mL/min/{1.73_m2} — ABNORMAL LOW (ref >59)

## 2021-12-26 LAB — VITAMIN D 25 HYDROXY (VIT D DEFICIENCY, FRACTURES): Vit D, 25-Hydroxy: 38.8 ng/mL (ref 30.0–100.0)

## 2021-12-26 LAB — HEMOGLOBIN A1C
Est. average glucose Bld gHb Est-mCnc: 154 mg/dL
Hgb A1c MFr Bld: 7 % — ABNORMAL HIGH (ref 4.8–5.6)

## 2021-12-30 ENCOUNTER — Encounter: Payer: Self-pay | Admitting: Family Medicine

## 2021-12-30 ENCOUNTER — Ambulatory Visit (INDEPENDENT_AMBULATORY_CARE_PROVIDER_SITE_OTHER): Payer: Medicare HMO | Admitting: Family Medicine

## 2021-12-30 ENCOUNTER — Other Ambulatory Visit: Payer: Self-pay

## 2021-12-30 VITALS — BP 150/67 | HR 58 | Resp 15 | Ht 67.0 in | Wt 152.1 lb

## 2021-12-30 DIAGNOSIS — N1832 Chronic kidney disease, stage 3b: Secondary | ICD-10-CM

## 2021-12-30 DIAGNOSIS — I10 Essential (primary) hypertension: Secondary | ICD-10-CM

## 2021-12-30 DIAGNOSIS — E1121 Type 2 diabetes mellitus with diabetic nephropathy: Secondary | ICD-10-CM

## 2021-12-30 DIAGNOSIS — E785 Hyperlipidemia, unspecified: Secondary | ICD-10-CM | POA: Diagnosis not present

## 2021-12-30 NOTE — Patient Instructions (Addendum)
F/U in 15 weeks, re eval BP, call if you need me sooner ? ?Blood sugar is good ? ?No med changes at this time ? ?Try to get in bed by 10: 30, and also start exercise program ? ?Thanks for choosing [facility], we consider it a privelige to serve you. ? ?Fasting lipid, cmp and EGFr and HBa1C 5 days before visit ? ? ? ?

## 2022-01-01 ENCOUNTER — Encounter: Payer: Self-pay | Admitting: Family Medicine

## 2022-01-01 NOTE — Progress Notes (Signed)
? ?Mikayla Jenkins     MRN: [id number]      DOB: [birth year] ? ? ?HPI ?Mikayla Jenkins is here for follow up and re-evaluation of chronic medical conditions, medication management and review of any available recent lab and radiology data.  ?Preventive health is updated, specifically  Cancer screening and Immunization.   ?Questions or concerns regarding consultations or procedures which the PT has had in the interim are  addressed. ?The PT denies any adverse reactions to current medications since the last visit.  ?There are no new concerns.  ?There are no specific complaints  ?Denies polyuria, polydipsia, blurred vision , or hypoglycemic episodes. ? ? ?ROS ?Denies recent fever or chills. ?Denies sinus pressure, nasal congestion, ear pain or sore throat. ?Denies chest congestion, productive cough or wheezing. ?Denies chest pains, palpitations and leg swelling ?Denies abdominal pain, nausea, vomiting,diarrhea or constipation.   ?Denies dysuria, frequency, hesitancy or incontinence. ?Denies uncontrolled  joint pain, swelling and limitation in mobility. ?Denies headaches, seizures, numbness, or tingling. ?Denies depression, anxiety or insomnia. ?Denies skin break down or rash. ? ? ?PE ? ?BP (!) 150/67   Pulse (!) 58   Resp 15   Ht '5\' 7"'$  (1.702 m)   Wt 152 lb 1.9 oz (69 kg)   SpO2 98%   BMI 23.83 kg/m?  ? ?Patient alert and oriented and in no cardiopulmonary distress. ? ?HEENT: No facial asymmetry, EOMI,     Neck supple . ? ?Chest: Clear to auscultation bilaterally. ? ?CVS: S1, S2 no murmurs, no S3.Regular rate. ? ?ABD: Soft non tender.  ? ?Ext: No edema ? ?MS: Adequate  though reduced ROM spine, shoulders, hips and knees. ? ?Skin: Intact, no ulcerations or rash noted. ? ?Psych: Good eye contact, normal affect. Memory intact not anxious or depressed appearing. ? ?CNS: CN 2-12 intact, power,  normal throughout.no focal deficits noted. ? ? ?Assessment & Plan ? ?Type 2 diabetes mellitus with nephropathy ([place]) ?Mikayla Jenkins is  reminded of the importance of commitment to daily physical activity for 30 minutes or more, as able and the need to limit carbohydrate intake to 30 to 60 grams per meal to help with blood sugar control.  ? ?The need to take medication as prescribed, test blood sugar as directed, and to call between visits if there is a concern that blood sugar is uncontrolled is also discussed.  ? ?Mikayla Jenkins is reminded of the importance of daily foot exam, annual eye examination, and good blood sugar, blood pressure and cholesterol control. ?Controlled, no change in medication ? ? ?Diabetic Labs Latest Ref Rng & Units 12/25/2021 08/25/2021 04/27/2021 01/07/2021 12/25/2020  ?HbA1c 4.8 - 5.6 % 7.0(H) 6.9(H) 6.5(H) - 7.4(H)  ?Microalbumin mg/dL - - - - -  ?Micro/Creat Ratio <30 mcg/mg creat - - - - -  ?Chol 100 - 199 mg/dL - 201(H) - - 212(H)  ?HDL >39 mg/dL - 105 - - 109  ?Calc LDL 0 - 99 mg/dL - 80 - - 88  ?Triglycerides 0 - 149 mg/dL - 92 - - 88  ?Creatinine 0.57 - 1.00 mg/dL 1.41(H) 1.40(H) 1.29(H) 1.13(H) 1.28(H)  ? ?BP/Weight 12/30/2021 09/01/2021 05/26/2021 04/30/2021 01/08/2021 01/06/2021 01/02/2021  ?Systolic BP 540 086 761 950 121 - 152  ?Diastolic BP 67 70 72 72 60 - 76  ?Wt. (Lbs) 152.12 153.12 147 149 - 152.44 152.44  ?BMI 23.83 23.98 23.02 23.34 - 23.87 23.88  ? ?Foot/eye exam completion dates Latest Ref Rng & Units 12/29/2020 12/25/2020  ?Eye Exam  No Retinopathy No Retinopathy No Retinopathy  ?Foot Form Completion - - -  ? ? ? ? ? ? ?Malignant hypertension ?DASH diet and commitment to daily physical activity for a minimum of 30 minutes discussed and encouraged, as a part of hypertension management. ?The importance of attaining a healthy weight is also discussed. ? ?BP/Weight 12/30/2021 09/01/2021 05/26/2021 04/30/2021 01/08/2021 01/06/2021 01/02/2021  ?Systolic BP 488 891 694 503 121 - 152  ?Diastolic BP 67 70 72 72 60 - 76  ?Wt. (Lbs) 152.12 153.12 147 149 - 152.44 152.44  ?BMI 23.83 23.98 23.02 23.34 - 23.87 23.88  ? ? ?Needs to be lower  , elevated at visit today, no med change a has been controllled on same meds in the past ?Tart daily exercise and reduce salt intake ? ? ?Hyperlipidemia LDL goal <100 ?Hyperlipidemia:Low fat diet discussed and encouraged. ? ? ?Lipid Panel  ?Lab Results  ?Component Value Date  ? CHOL 201 (H) 08/25/2021  ? HDL 105 08/25/2021  ? [place] 80 08/25/2021  ? TRIG 92 08/25/2021  ? CHOLHDL 1.9 08/25/2021  ? ? ? ?Updated lab needed at/ before next visit. ? ? ?Stage 3b chronic kidney disease ?Stable and followed by [facility] ? ?

## 2022-01-01 NOTE — Assessment & Plan Note (Signed)
Mikayla Jenkins is reminded of the importance of commitment to daily physical activity for 30 minutes or more, as able and the need to limit carbohydrate intake to 30 to 60 grams per meal to help with blood sugar control.  ? ?The need to take medication as prescribed, test blood sugar as directed, and to call between visits if there is a concern that blood sugar is uncontrolled is also discussed.  ? ?Mikayla Jenkins is reminded of the importance of daily foot exam, annual eye examination, and good blood sugar, blood pressure and cholesterol control. ?Controlled, no change in medication ? ? ?Diabetic Labs Latest Ref Rng & Units 12/25/2021 08/25/2021 04/27/2021 01/07/2021 12/25/2020  ?HbA1c 4.8 - 5.6 % 7.0(H) 6.9(H) 6.5(H) - 7.4(H)  ?Microalbumin mg/dL - - - - -  ?Micro/Creat Ratio <30 mcg/mg creat - - - - -  ?Chol 100 - 199 mg/dL - 201(H) - - 212(H)  ?HDL >39 mg/dL - 105 - - 109  ?Calc LDL 0 - 99 mg/dL - 80 - - 88  ?Triglycerides 0 - 149 mg/dL - 92 - - 88  ?Creatinine 0.57 - 1.00 mg/dL 1.41(H) 1.40(H) 1.29(H) 1.13(H) 1.28(H)  ? ?BP/Weight 12/30/2021 09/01/2021 05/26/2021 04/30/2021 01/08/2021 01/06/2021 01/02/2021  ?Systolic BP 893 810 175 102 121 - 152  ?Diastolic BP 67 70 72 72 60 - 76  ?Wt. (Lbs) 152.12 153.12 147 149 - 152.44 152.44  ?BMI 23.83 23.98 23.02 23.34 - 23.87 23.88  ? ?Foot/eye exam completion dates Latest Ref Rng & Units 12/29/2020 12/25/2020  ?Eye Exam No Retinopathy No Retinopathy No Retinopathy  ?Foot Form Completion - - -  ? ? ? ? ? ?

## 2022-01-01 NOTE — Assessment & Plan Note (Signed)
DASH diet and commitment to daily physical activity for a minimum of 30 minutes discussed and encouraged, as a part of hypertension management. ?The importance of attaining a healthy weight is also discussed. ? ?BP/Weight 12/30/2021 09/01/2021 05/26/2021 04/30/2021 01/08/2021 01/06/2021 01/02/2021  ?Systolic BP 923 414 436 016 121 - 152  ?Diastolic BP 67 70 72 72 60 - 76  ?Wt. (Lbs) 152.12 153.12 147 149 - 152.44 152.44  ?BMI 23.83 23.98 23.02 23.34 - 23.87 23.88  ? ? ?Needs to be lower , elevated at visit today, no med change a has been controllled on same meds in the past ?Tart daily exercise and reduce salt intake ? ?

## 2022-01-01 NOTE — Assessment & Plan Note (Signed)
Hyperlipidemia:Low fat diet discussed and encouraged. ? ? ?Lipid Panel  ?Lab Results  ?Component Value Date  ? CHOL 201 (H) 08/25/2021  ? HDL 105 08/25/2021  ? [place] 80 08/25/2021  ? TRIG 92 08/25/2021  ? CHOLHDL 1.9 08/25/2021  ? ? ? ?Updated lab needed at/ before next visit. ? ?

## 2022-01-01 NOTE — Assessment & Plan Note (Signed)
Stable and followed by [facility] ?

## 2022-01-26 DIAGNOSIS — E1122 Type 2 diabetes mellitus with diabetic chronic kidney disease: Secondary | ICD-10-CM | POA: Diagnosis not present

## 2022-01-26 DIAGNOSIS — I129 Hypertensive chronic kidney disease with stage 1 through stage 4 chronic kidney disease, or unspecified chronic kidney disease: Secondary | ICD-10-CM | POA: Diagnosis not present

## 2022-01-26 DIAGNOSIS — I5032 Chronic diastolic (congestive) heart failure: Secondary | ICD-10-CM | POA: Diagnosis not present

## 2022-01-26 DIAGNOSIS — N189 Chronic kidney disease, unspecified: Secondary | ICD-10-CM | POA: Diagnosis not present

## 2022-01-28 DIAGNOSIS — E871 Hypo-osmolality and hyponatremia: Secondary | ICD-10-CM | POA: Diagnosis not present

## 2022-01-28 DIAGNOSIS — I5032 Chronic diastolic (congestive) heart failure: Secondary | ICD-10-CM | POA: Diagnosis not present

## 2022-01-28 DIAGNOSIS — E1122 Type 2 diabetes mellitus with diabetic chronic kidney disease: Secondary | ICD-10-CM | POA: Diagnosis not present

## 2022-01-28 DIAGNOSIS — N189 Chronic kidney disease, unspecified: Secondary | ICD-10-CM | POA: Diagnosis not present

## 2022-01-28 DIAGNOSIS — I129 Hypertensive chronic kidney disease with stage 1 through stage 4 chronic kidney disease, or unspecified chronic kidney disease: Secondary | ICD-10-CM | POA: Diagnosis not present

## 2022-03-08 ENCOUNTER — Other Ambulatory Visit: Payer: Self-pay | Admitting: Family Medicine

## 2022-03-09 ENCOUNTER — Other Ambulatory Visit: Payer: Self-pay | Admitting: Family Medicine

## 2022-03-09 DIAGNOSIS — I10 Essential (primary) hypertension: Secondary | ICD-10-CM

## 2022-03-09 DIAGNOSIS — E785 Hyperlipidemia, unspecified: Secondary | ICD-10-CM

## 2022-03-10 ENCOUNTER — Telehealth: Payer: Self-pay | Admitting: *Deleted

## 2022-03-10 NOTE — Telephone Encounter (Signed)
Error: Duplicate note.

## 2022-03-10 NOTE — Telephone Encounter (Signed)
Attempted Ortho bundle 1 year call to patient; left VM requesting call back.

## 2022-03-11 ENCOUNTER — Telehealth: Payer: Self-pay | Admitting: *Deleted

## 2022-03-11 NOTE — Telephone Encounter (Signed)
Ortho bundle 1 year call completed. ?

## 2022-04-05 DIAGNOSIS — E785 Hyperlipidemia, unspecified: Secondary | ICD-10-CM | POA: Diagnosis not present

## 2022-04-05 DIAGNOSIS — E1121 Type 2 diabetes mellitus with diabetic nephropathy: Secondary | ICD-10-CM | POA: Diagnosis not present

## 2022-04-06 LAB — BMP8+EGFR
BUN/Creatinine Ratio: 20 (ref 12–28)
BUN: 27 mg/dL (ref 8–27)
CO2: 22 mmol/L (ref 20–29)
Calcium: 10 mg/dL (ref 8.7–10.3)
Chloride: 104 mmol/L (ref 96–106)
Creatinine, Ser: 1.36 mg/dL — ABNORMAL HIGH (ref 0.57–1.00)
Glucose: 168 mg/dL — ABNORMAL HIGH (ref 70–99)
Potassium: 4.3 mmol/L (ref 3.5–5.2)
Sodium: 143 mmol/L (ref 134–144)
eGFR: 40 mL/min/{1.73_m2} — ABNORMAL LOW (ref >59)

## 2022-04-06 LAB — LIPID PANEL
Chol/HDL Ratio: 1.9 ratio (ref 0.0–4.4)
Cholesterol, Total: 214 mg/dL — ABNORMAL HIGH (ref 100–199)
HDL: 112 mg/dL (ref >39)
LDL Chol Calc (NIH): 81 mg/dL (ref 0–99)
Triglycerides: 128 mg/dL (ref 0–149)
VLDL Cholesterol Cal: 21 mg/dL (ref 5–40)

## 2022-04-06 LAB — HEMOGLOBIN A1C
Est. average glucose Bld gHb Est-mCnc: 146 mg/dL
Hgb A1c MFr Bld: 6.7 % — ABNORMAL HIGH (ref 4.8–5.6)

## 2022-04-14 ENCOUNTER — Encounter: Payer: Self-pay | Admitting: Family Medicine

## 2022-04-14 ENCOUNTER — Ambulatory Visit (INDEPENDENT_AMBULATORY_CARE_PROVIDER_SITE_OTHER): Payer: Medicare HMO | Admitting: Family Medicine

## 2022-04-14 VITALS — BP 143/83 | HR 59 | Resp 16 | Ht 66.0 in | Wt 146.1 lb

## 2022-04-14 DIAGNOSIS — N1832 Chronic kidney disease, stage 3b: Secondary | ICD-10-CM | POA: Diagnosis not present

## 2022-04-14 DIAGNOSIS — E785 Hyperlipidemia, unspecified: Secondary | ICD-10-CM | POA: Diagnosis not present

## 2022-04-14 DIAGNOSIS — I10 Essential (primary) hypertension: Secondary | ICD-10-CM

## 2022-04-14 DIAGNOSIS — M5441 Lumbago with sciatica, right side: Secondary | ICD-10-CM

## 2022-04-14 DIAGNOSIS — E1121 Type 2 diabetes mellitus with diabetic nephropathy: Secondary | ICD-10-CM

## 2022-04-14 DIAGNOSIS — G8929 Other chronic pain: Secondary | ICD-10-CM | POA: Diagnosis not present

## 2022-04-14 MED ORDER — SPIRONOLACTONE 25 MG PO TABS: 25.0000 mg | ORAL_TABLET | Freq: Every day | ORAL | 3 refills | Status: DC

## 2022-04-14 NOTE — Patient Instructions (Signed)
Annual exam and re valuate blood[ pressure end August or early September, call if you need me sooner  Congrats on excellent blood sugar  Please reduce margarine and butter, total cholesterol is slightly high  Kidney function is stable  Increase spironolactone 25 mg to ONE tablet daily, as blood pressure is still slightly high  Non fasting cmp and eGFR with next blood  draw , if getting for Dr Marylene Buerger, or 3 days before next appointment with me , whichever is first  You need tdAP and covid  booster, please get on different days at your pharmacy  You are referrd for eye exam , Dr Jorja Loa  Thanks for choosing [facility], we consider it a privelige to serve you.

## 2022-04-14 NOTE — Assessment & Plan Note (Signed)
Improved and well controlled Mikayla Jenkins is reminded of the importance of commitment to daily physical activity for 30 minutes or more, as able and the need to limit carbohydrate intake to 30 to 60 grams per meal to help with blood sugar control.   The need to take medication as prescribed, test blood sugar as directed, and to call between visits if there is a concern that blood sugar is uncontrolled is also discussed.   Mikayla Jenkins is reminded of the importance of daily foot exam, annual eye examination, and good blood sugar, blood pressure and cholesterol control.     Latest Ref Rng & Units 04/05/2022    2:35 PM 12/25/2021    1:59 PM 08/25/2021    3:39 PM 04/27/2021   10:21 AM 01/07/2021    3:45 AM  Diabetic Labs  HbA1c 4.8 - 5.6 % 6.7  7.0  6.9  6.5    Chol 100 - 199 mg/dL 214   201     HDL >39 mg/dL 112   105     Calc LDL 0 - 99 mg/dL 81   80     Triglycerides 0 - 149 mg/dL 128   92     Creatinine 0.57 - 1.00 mg/dL 1.36  1.41  1.40  1.29  1.13       04/14/2022    1:45 PM 12/30/2021   11:03 AM 09/01/2021   11:43 AM 09/01/2021   11:26 AM 05/26/2021    9:51 AM 04/30/2021   10:50 AM 01/08/2021    8:03 AM  BP/Weight  Systolic BP 355 974 163 845 364 680 321  Diastolic BP 83 67 70 76 72 72 60  Wt. (Lbs) 146.12 152.12  153.12 147 149   BMI 23.58 kg/m2 23.83 kg/m2  23.98 kg/m2 23.02 kg/m2 23.34 kg/m2       Latest Ref Rng & Units 12/29/2020   12:00 AM 12/25/2020    5:10 PM  Foot/eye exam completion dates  Eye Exam No Retinopathy No Retinopathy     No Retinopathy         This result is from an external source.

## 2022-04-15 ENCOUNTER — Encounter: Payer: Self-pay | Admitting: Family Medicine

## 2022-04-15 NOTE — Assessment & Plan Note (Signed)
No recent flare, not disabling currently

## 2022-04-15 NOTE — Assessment & Plan Note (Signed)
Uncontrolled , inc spironolactone and re eval in 8 weeks DASH diet and commitment to daily physical activity for a minimum of 30 minutes discussed and encouraged, as a part of hypertension management. The importance of attaining a healthy weight is also discussed.     04/14/2022    1:45 PM 12/30/2021   11:03 AM 09/01/2021   11:43 AM 09/01/2021   11:26 AM 05/26/2021    9:51 AM 04/30/2021   10:50 AM 01/08/2021    8:03 AM  BP/Weight  Systolic BP 282 417 530 104 045 913 685  Diastolic BP 83 67 70 76 72 72 60  Wt. (Lbs) 146.12 152.12  153.12 147 149   BMI 23.58 kg/m2 23.83 kg/m2  23.98 kg/m2 23.02 kg/m2 23.34 kg/m2

## 2022-04-15 NOTE — Assessment & Plan Note (Signed)
Stable and followed by Nephrology 

## 2022-04-15 NOTE — Assessment & Plan Note (Signed)
Hyperlipidemia:Low fat diet discussed and encouraged.   Lipid Panel  Lab Results  Component Value Date   CHOL 214 (H) 04/05/2022   HDL 112 04/05/2022   LDLCALC 81 04/05/2022   TRIG 128 04/05/2022   CHOLHDL 1.9 04/05/2022   Needs to reduce fat in diet

## 2022-04-15 NOTE — Progress Notes (Signed)
GURPREET MARIANI     MRN: [id number]      DOB: 1946/05/21   HPI Ms. Kempton is here for follow up and re-evaluation of chronic medical conditions, medication management and review of any available recent lab and radiology data.  Preventive health is updated, specifically  Cancer screening and Immunization.   Questions or concerns regarding consultations or procedures which the PT has had in the interim are  addressed. The PT denies any adverse reactions to current medications since the last visit.  Denies polyuria, polydipsia, blurred vision , or hypoglycemic episodes. Not currently exercising but plans to start ROS Denies recent fever or chills. Denies sinus pressure, nasal congestion, ear pain or sore throat. Denies chest congestion, productive cough or wheezing. Denies chest pains, palpitations and leg swelling Denies abdominal pain, nausea, vomiting,diarrhea or constipation.   Denies dysuria, frequency, hesitancy or incontinence. Denies uncontrolled joint pain, swelling and limitation in mobility. Denies headaches, seizures, numbness, or tingling. Denies depression, anxiety or insomnia. Denies skin break down or rash.   PE  BP (!) 143/83   Pulse (!) 59   Resp 16   Ht '5\' 6"'$  (1.676 m)   Wt 146 lb 1.9 oz (66.3 kg)   SpO2 92%   BMI 23.58 kg/m   Patient alert and oriented and in no cardiopulmonary distress.  HEENT: No facial asymmetry, EOMI,     Neck supple .  Chest: Clear to auscultation bilaterally.  CVS: S1, S2 no murmurs, no S3.Regular rate.  ABD: Soft non tender.   Ext: No edema  MS: Adequate though reduced  ROM spine, shoulders, hips and knees.  Skin: Intact, no ulcerations or rash noted.  Psych: Good eye contact, normal affect. Memory intact not anxious or depressed appearing.  CNS: CN 2-12 intact, power,  normal throughout.no focal deficits noted.   Assessment & Plan  Type 2 diabetes mellitus with nephropathy ([place]) Improved and well controlled Ms. Ellender  is reminded of the importance of commitment to daily physical activity for 30 minutes or more, as able and the need to limit carbohydrate intake to 30 to 60 grams per meal to help with blood sugar control.   The need to take medication as prescribed, test blood sugar as directed, and to call between visits if there is a concern that blood sugar is uncontrolled is also discussed.   Ms. Durocher is reminded of the importance of daily foot exam, annual eye examination, and good blood sugar, blood pressure and cholesterol control.     Latest Ref Rng & Units 04/05/2022    2:35 PM 12/25/2021    1:59 PM 08/25/2021    3:39 PM 04/27/2021   10:21 AM 01/07/2021    3:45 AM  Diabetic Labs  HbA1c 4.8 - 5.6 % 6.7  7.0  6.9  6.5    Chol 100 - 199 mg/dL 214   201     HDL >39 mg/dL 112   105     Calc LDL 0 - 99 mg/dL 81   80     Triglycerides 0 - 149 mg/dL 128   92     Creatinine 0.57 - 1.00 mg/dL 1.36  1.41  1.40  1.29  1.13       04/14/2022    1:45 PM 12/30/2021   11:03 AM 09/01/2021   11:43 AM 09/01/2021   11:26 AM 05/26/2021    9:51 AM 04/30/2021   10:50 AM 01/08/2021    8:03 AM  BP/Weight  Systolic BP 332  150 130 157 939 030 092  Diastolic BP 83 67 70 76 72 72 60  Wt. (Lbs) 146.12 152.12  153.12 147 149   BMI 23.58 kg/m2 23.83 kg/m2  23.98 kg/m2 23.02 kg/m2 23.34 kg/m2       Latest Ref Rng & Units 12/29/2020   12:00 AM 12/25/2020    5:10 PM  Foot/eye exam completion dates  Eye Exam No Retinopathy No Retinopathy     No Retinopathy         This result is from an external source.        Malignant hypertension Uncontrolled , inc spironolactone and re eval in 8 weeks DASH diet and commitment to daily physical activity for a minimum of 30 minutes discussed and encouraged, as a part of hypertension management. The importance of attaining a healthy weight is also discussed.     04/14/2022    1:45 PM 12/30/2021   11:03 AM 09/01/2021   11:43 AM 09/01/2021   11:26 AM 05/26/2021    9:51 AM 04/30/2021    10:50 AM 01/08/2021    8:03 AM  BP/Weight  Systolic BP 330 076 226 333 545 625 638  Diastolic BP 83 67 70 76 72 72 60  Wt. (Lbs) 146.12 152.12  153.12 147 149   BMI 23.58 kg/m2 23.83 kg/m2  23.98 kg/m2 23.02 kg/m2 23.34 kg/m2        Stage 3b chronic kidney disease Stable and followed by Nephrology  Hyperlipidemia LDL goal <100 Hyperlipidemia:Low fat diet discussed and encouraged.   Lipid Panel  Lab Results  Component Value Date   CHOL 214 (H) 04/05/2022   HDL 112 04/05/2022   LDLCALC 81 04/05/2022   TRIG 128 04/05/2022   CHOLHDL 1.9 04/05/2022   Needs to reduce fat in diet    Low back pain with right-sided sciatica No recent flare, not disabling currently

## 2022-04-19 DIAGNOSIS — E1122 Type 2 diabetes mellitus with diabetic chronic kidney disease: Secondary | ICD-10-CM | POA: Diagnosis not present

## 2022-04-19 DIAGNOSIS — I129 Hypertensive chronic kidney disease with stage 1 through stage 4 chronic kidney disease, or unspecified chronic kidney disease: Secondary | ICD-10-CM | POA: Diagnosis not present

## 2022-04-19 DIAGNOSIS — E871 Hypo-osmolality and hyponatremia: Secondary | ICD-10-CM | POA: Diagnosis not present

## 2022-04-19 DIAGNOSIS — N189 Chronic kidney disease, unspecified: Secondary | ICD-10-CM | POA: Diagnosis not present

## 2022-04-19 DIAGNOSIS — I5032 Chronic diastolic (congestive) heart failure: Secondary | ICD-10-CM | POA: Diagnosis not present

## 2022-04-21 DIAGNOSIS — E871 Hypo-osmolality and hyponatremia: Secondary | ICD-10-CM | POA: Diagnosis not present

## 2022-04-21 DIAGNOSIS — I5032 Chronic diastolic (congestive) heart failure: Secondary | ICD-10-CM | POA: Diagnosis not present

## 2022-04-21 DIAGNOSIS — N189 Chronic kidney disease, unspecified: Secondary | ICD-10-CM | POA: Diagnosis not present

## 2022-04-21 DIAGNOSIS — E1122 Type 2 diabetes mellitus with diabetic chronic kidney disease: Secondary | ICD-10-CM | POA: Diagnosis not present

## 2022-04-21 DIAGNOSIS — I129 Hypertensive chronic kidney disease with stage 1 through stage 4 chronic kidney disease, or unspecified chronic kidney disease: Secondary | ICD-10-CM | POA: Diagnosis not present

## 2022-05-31 ENCOUNTER — Ambulatory Visit (INDEPENDENT_AMBULATORY_CARE_PROVIDER_SITE_OTHER): Payer: Medicare HMO

## 2022-05-31 DIAGNOSIS — Z0000 Encounter for general adult medical examination without abnormal findings: Secondary | ICD-10-CM | POA: Diagnosis not present

## 2022-05-31 NOTE — Progress Notes (Signed)
Subjective:   Mikayla Jenkins is a 76 y.o. female who presents for Medicare Annual (Subsequent) preventive examination.  Review of Systems     Mikayla Jenkins , Thank you for taking time to come for your Medicare Wellness Visit. I appreciate your ongoing commitment to your health goals. Please review the following plan we discussed and let me know if I can assist you in the future.   These are the goals we discussed:  Goals      Have 3 meals a day     Patient Stated     See grandchildren more.        This is a list of the screening recommended for you and due dates:  Health Maintenance  Topic Date Due   COVID-19 Vaccine (3 - Moderna risk series) 03/18/2020   Tetanus Vaccine  04/14/2021   Eye exam for diabetics  12/29/2021   Complete foot exam   04/30/2022   Flu Shot  05/18/2022   Hemoglobin A1C  10/05/2022   Pneumonia Vaccine  Completed   DEXA scan (bone density measurement)  Completed   Hepatitis C Screening: USPSTF Recommendation to screen - Ages 16-79 yo.  Completed   Zoster (Shingles) Vaccine  Completed   HPV Vaccine  Aged Out   Colon Cancer Screening  Discontinued    Cardiac Risk Factors include: none     Objective:    Today's Vitals   05/31/22 1451  PainSc: 0-No pain   There is no height or weight on file to calculate BMI.     05/31/2022    2:56 PM 05/26/2021    9:57 AM 01/07/2021    8:04 AM 01/02/2021    1:20 PM 05/22/2020   10:13 AM 11/16/2019    7:20 PM 05/16/2018   10:46 AM  Advanced Directives  Does Patient Have a Medical Advance Directive? No No No No No No Yes  Does patient want to make changes to medical advance directive?       Yes (ED - Information included in AVS)  Would patient like information on creating a medical advance directive? No - Patient declined No - Patient declined No - Patient declined  No - Patient declined      Current Medications (verified) Outpatient Encounter Medications as of 05/31/2022  Medication Sig   acetaminophen (TYLENOL) 650  MG CR tablet Take 650 mg by mouth every 8 (eight) hours as needed for pain.   aspirin 81 MG chewable tablet Chew 1 tablet (81 mg total) by mouth 2 (two) times daily.   atenolol-chlorthalidone (TENORETIC) 100-25 MG tablet TAKE 1 TABLET EVERY DAY   benazepril (LOTENSIN) 40 MG tablet TAKE 1 TABLET EVERY DAY   blood glucose meter kit and supplies Dispense based on patient and insurance preference. Use up to four times daily as directed. (FOR ICD-10 E10.9, E11.9).   blood glucose meter kit and supplies Dispense based on patient and insurance preference  [employer] guide meter and supplies once daily testing dx e11.9   Calcium Carbonate-Vitamin D 600-400 MG-UNIT tablet Take 1 tablet by mouth daily.   cloNIDine (CATAPRES) 0.3 MG tablet TAKE 1 AND 1/2 TABLETS AT BEDTIME   glipiZIDE (GLUCOTROL XL) 2.5 MG 24 hr tablet TAKE 1 TABLET WITH BREAKFAST EVERY TUESDAY, THURSDAY AND SATURDAY   lovastatin (MEVACOR) 40 MG tablet TAKE 1 TABLET EVERY DAY   Multiple Vitamin (MULTIVITAMIN) tablet Take 1 tablet by mouth daily.   spironolactone (ALDACTONE) 25 MG tablet Take 1 tablet (25 mg total) by  mouth daily.   amLODipine (NORVASC) 10 MG tablet Take 1 tablet (10 mg total) by mouth daily for 7 days.   No facility-administered encounter medications on file as of 05/31/2022.    Allergies (verified) Metformin and related   History: Past Medical History:  Diagnosis Date   Diabetes mellitus type II    Hyperlipidemia    Hypertension    Renal disorder    Past Surgical History:  Procedure Laterality Date   ABDOMINAL HYSTERECTOMY     COLONOSCOPY N/A 06/13/2015   Procedure: COLONOSCOPY;  Surgeon: Rogene Houston, MD;  Location: [facility];  Service: Endoscopy;  Laterality: N/A;  930-rescheduled 8/26 @ 8:30am Ann to notify pt   HERNIA REPAIR     inguinal herniorrhapy right     right eye surgery secondary to right eye weakness     SALPINGOOPHORECTOMY     TOTAL HIP ARTHROPLASTY Right 01/06/2021   TOTAL HIP  ARTHROPLASTY Right 01/06/2021   Procedure: RIGHT TOTAL HIP ARTHROPLASTY ANTERIOR APPROACH;  Surgeon: Mcarthur Rossetti, MD;  Location: [place];  Service: Orthopedics;  Laterality: Right;   TUBAL LIGATION     Family History  Problem Relation Age of Onset   Pneumonia Brother        on continuous O2   Stroke Brother    Stroke Sister    COPD Sister    Diabetes Sister    Stroke Sister    Hypertension Mother    Diabetes Mother    Fibroids Daughter        uterine   Social History   Socioeconomic History   Marital status: Married    Spouse name: Not on file   Number of children: Not on file   Years of education: Not on file   Highest education level: High school graduate  Occupational History   Not on file  Tobacco Use   Smoking status: Never   Smokeless tobacco: Never  Vaping Use   Vaping Use: Never used  Substance and Sexual Activity   Alcohol use: No   Drug use: No   Sexual activity: Yes    Birth control/protection: Surgical  Other Topics Concern   Not on file  Social History Narrative   Not on file   Social Determinants of Health   Financial Resource Strain: Low Risk  (05/31/2022)   Overall Financial Resource Strain (CARDIA)    Difficulty of Paying Living Expenses: Not hard at all  Food Insecurity: No Food Insecurity (05/31/2022)   Hunger Vital Sign    Worried About Running Out of Food in the Last Year: Never true    Ran Out of Food in the Last Year: Never true  Transportation Needs: No Transportation Needs (05/31/2022)   PRAPARE - Hydrologist (Medical): No    Lack of Transportation (Non-Medical): No  Physical Activity: Insufficiently Active (05/31/2022)   Exercise Vital Sign    Days of Exercise per Week: 3 days    Minutes of Exercise per Session: 30 min  Stress: No Stress Concern Present (05/31/2022)   [place]    Feeling of Stress : Not at all  Social  Connections: Moderately Integrated (05/31/2022)   Social Connection and Isolation Panel [NHANES]    Frequency of Communication with Friends and Family: More than three times a week    Frequency of Social Gatherings with Friends and Family: Once a week    Attends Religious Services: More than 4  times per year    Active Member of Clubs or Organizations: No    Attends Archivist Meetings: Never    Marital Status: Married    Tobacco Counseling Counseling given: Not Answered   Clinical Intake:  Diabetic?yes Nutrition Risk Assessment:  Has the patient had any N/V/D within the last 2 months?  No  Does the patient have any non-healing wounds?  No  Has the patient had any unintentional weight loss or weight gain?  No   Diabetes:  Is the patient diabetic?  Yes  If diabetic, was a CBG obtained today?  No  Did the patient bring in their glucometer from home?  No  How often do you monitor your CBG's? daily.   Financial Strains and Diabetes Management:  Are you having any financial strains with the device, your supplies or your medication? No .  Does the patient want to be seen by Chronic Care Management for management of their diabetes?  No  Would the patient like to be referred to a Nutritionist or for Diabetic Management?  No   Diabetic Exams:  Diabetic Eye Exam: Completed 12/29/20 Diabetic Foot Exam: Completed 04/30/21    Interpreter Needed?: No      Activities of Daily Living    05/31/2022    2:57 PM  In your present state of health, do you have any difficulty performing the following activities:  Hearing? 0  Vision? 0  Difficulty concentrating or making decisions? 0  Walking or climbing stairs? 0  Dressing or bathing? 0  Doing errands, shopping? 0  Preparing Food and eating ? N  Using the Toilet? N  In the past six months, have you accidently leaked urine? N  Do you have problems with loss of bowel control? N  Managing your Medications? N  Managing your  Finances? N  Housekeeping or managing your Housekeeping? N    Patient Care Team: Fayrene Helper, MD as PCP - General (Family Medicine) Herminio Commons, MD (Inactive) as PCP - Cardiology (Cardiology)  Indicate any recent Medical Services you may have received from other than [facility] providers in the past year (date may be approximate).     Assessment:   This is a routine wellness examination for [facility].  Hearing/Vision screen No results found.  Dietary issues and exercise activities discussed: Current Exercise Habits: Home exercise routine, Type of exercise: walking, Time (Minutes): 20, Frequency (Times/Week): 3, Weekly Exercise (Minutes/Week): 60, Intensity: Mild, Exercise limited by: None identified   Goals Addressed             This Visit's Progress    Patient Stated       See grandchildren more.      Depression Screen    05/31/2022    2:57 PM 05/31/2022    2:54 PM 04/14/2022    1:47 PM 12/30/2021   11:04 AM 09/01/2021   11:27 AM 05/26/2021   10:03 AM 05/26/2021    9:55 AM  PHQ 2/9 Scores  PHQ - 2 Score 0 0 0 0 0 0 0  PHQ- 9 Score     0      Fall Risk    05/31/2022    2:56 PM 04/14/2022    1:47 PM 12/30/2021   11:04 AM 09/01/2021   11:27 AM 05/26/2021    9:57 AM  Fall Risk   Falls in the past year? 0 0 0 0 0  Number falls in past yr: 0 0 0 0 0  Injury with  Fall? 0 0 0 0 0  Risk for fall due to : No Fall Risks No Fall Risks   No Fall Risks  Follow up Falls evaluation completed    Falls evaluation completed    [place]:  Any stairs in or around the home? No  If so, are there any without handrails?  N/a Home free of loose throw rugs in walkways, pet beds, electrical cords, etc? Yes  Adequate lighting in your home to reduce risk of falls? Yes   ASSISTIVE DEVICES UTILIZED TO PREVENT FALLS:  Life alert? No  Use of a cane, walker or w/c? No  Grab bars in the bathroom? Yes  Shower chair or bench in shower? Yes  Elevated  toilet seat or a handicapped toilet? Yes    Cognitive Function:        05/31/2022    2:57 PM 05/26/2021   10:04 AM 05/22/2020   10:15 AM 05/22/2019   10:56 AM 05/16/2018   10:48 AM  6CIT Screen  What Year? 0 points 0 points 0 points 0 points 0 points  What month? 0 points 0 points 0 points 0 points 0 points  What time? 0 points 0 points 0 points 0 points 0 points  Count back from 20 0 points 0 points 0 points 0 points 0 points  Months in reverse 0 points 0 points 0 points 0 points 0 points  Repeat phrase 0 points 0 points 0 points 0 points 2 points  Total Score 0 points 0 points 0 points 0 points 2 points    Immunizations Immunization History  Administered Date(s) Administered   Fluad Quad(high Dose 65+) 07/02/2019, 08/18/2020, 09/01/2021   Influenza Split 08/11/2011, 08/23/2012   Influenza, High Dose Seasonal PF 08/14/2018   Influenza,inj,Quad PF,6+ Mos 08/10/2013, 08/10/2014, 06/30/2015, 07/12/2016, 08/02/2017   Moderna Sars-Covid-2 Vaccination 01/22/2020, 02/19/2020   Pneumococcal Conjugate-13 05/07/2014   Pneumococcal Polysaccharide-23 09/11/2007, 08/11/2011   Tdap 04/15/2011   Zoster Recombinat (Shingrix) 05/02/2021, 04/05/2022    TDAP status: Due, Education has been provided regarding the importance of this vaccine. Advised may receive this vaccine at local pharmacy or Health Dept. Aware to provide a copy of the vaccination record if obtained from local pharmacy or Health Dept. Verbalized acceptance and understanding.  Flu Vaccine status: Up to date  Pneumococcal vaccine status: Up to date  Covid-19 vaccine status: Completed vaccines  Qualifies for Shingles Vaccine? Yes   Zostavax completed Yes   Shingrix Completed?: Yes  Screening Tests Health Maintenance  Topic Date Due   COVID-19 Vaccine (3 - Moderna risk series) 03/18/2020   TETANUS/TDAP  04/14/2021   OPHTHALMOLOGY EXAM  12/29/2021   FOOT EXAM  04/30/2022   INFLUENZA VACCINE  05/18/2022   HEMOGLOBIN A1C   10/05/2022   Pneumonia Vaccine 78+ Years old  Completed   DEXA SCAN  Completed   Hepatitis C Screening  Completed   Zoster Vaccines- Shingrix  Completed   HPV VACCINES  Aged Out   COLONOSCOPY (Pts 45-18yr Insurance coverage will need to be confirmed)  Discontinued    Health Maintenance  Health Maintenance Due  Topic Date Due   COVID-19 Vaccine (3 - Moderna risk series) 03/18/2020   TETANUS/TDAP  04/14/2021   OPHTHALMOLOGY EXAM  12/29/2021   FOOT EXAM  04/30/2022   INFLUENZA VACCINE  05/18/2022    Colorectal cancer screening: No longer required.   Mammogram status: No longer required due to age.  Bone Density status: Completed 05/26/2018. Results reflect:  Bone density results: NORMAL. Repeat every 2 years.  Lung Cancer Screening: (Low Dose CT Chest recommended if Age 1-80 years, 30 pack-year currently smoking OR have quit w/in 15years.) does not qualify.   Lung Cancer Screening Referral: no  Additional Screening:  Hepatitis C Screening: does qualify; Completed 01/22/16  Vision Screening: Recommended annual ophthalmology exams for early detection of glaucoma and other disorders of the eye. Is the patient up to date with their annual eye exam?  No  Who is the provider or what is the name of the office in which the patient attends annual eye exams? N/a If pt is not established with a provider, would they like to be referred to a provider to establish care? No .   Dental Screening: Recommended annual dental exams for proper oral hygiene  Community Resource Referral / Chronic Care Management: CRR required this visit?  No   CCM required this visit?  No      Plan:     I have personally reviewed and noted the following in the patient's chart:   Medical and social history Use of alcohol, tobacco or illicit drugs  Current medications and supplements including opioid prescriptions.  Functional ability and status Nutritional status Physical activity Advanced  directives List of other physicians Hospitalizations, surgeries, and ER visits in previous 12 months Vitals Screenings to include cognitive, depression, and falls Referrals and appointments  In addition, I have reviewed and discussed with patient certain preventive protocols, quality metrics, and best practice recommendations. A written personalized care plan for preventive services as well as general preventive health recommendations were provided to patient.     Quentin Angst, [place]   05/31/2022

## 2022-05-31 NOTE — Patient Instructions (Signed)
  Mikayla Jenkins , Thank you for taking time to come for your Medicare Wellness Visit. I appreciate your ongoing commitment to your health goals. Please review the following plan we discussed and let me know if I can assist you in the future.   These are the goals we discussed:  Goals      Have 3 meals a day     Patient Stated     See grandchildren more.        This is a list of the screening recommended for you and due dates:  Health Maintenance  Topic Date Due   COVID-19 Vaccine (3 - Moderna risk series) 03/18/2020   Tetanus Vaccine  04/14/2021   Eye exam for diabetics  12/29/2021   Complete foot exam   04/30/2022   Flu Shot  05/18/2022   Hemoglobin A1C  10/05/2022   Pneumonia Vaccine  Completed   DEXA scan (bone density measurement)  Completed   Hepatitis C Screening: USPSTF Recommendation to screen - Ages 41-79 yo.  Completed   Zoster (Shingles) Vaccine  Completed   HPV Vaccine  Aged Out   Colon Cancer Screening  Discontinued

## 2022-06-03 NOTE — Progress Notes (Signed)
I connected with  Mikayla Jenkins on 06/03/22 by a audio enabled telemedicine application and verified that I am speaking with the correct person using two identifiers.  Patient Location: Home  Provider Location: Office/Clinic  I discussed the limitations of evaluation and management by telemedicine. The patient expressed understanding and agreed to proceed.

## 2022-06-15 DIAGNOSIS — E1121 Type 2 diabetes mellitus with diabetic nephropathy: Secondary | ICD-10-CM | POA: Diagnosis not present

## 2022-06-16 LAB — CMP14+EGFR
ALT: 12 IU/L (ref 0–32)
AST: 19 IU/L (ref 0–40)
Albumin/Globulin Ratio: 2.1 (ref 1.2–2.2)
Albumin: 4.8 g/dL (ref 3.8–4.8)
Alkaline Phosphatase: 85 IU/L (ref 44–121)
BUN/Creatinine Ratio: 13 (ref 12–28)
BUN: 23 mg/dL (ref 8–27)
Bilirubin Total: 0.6 mg/dL (ref 0.0–1.2)
CO2: 22 mmol/L (ref 20–29)
Calcium: 10 mg/dL (ref 8.7–10.3)
Chloride: 103 mmol/L (ref 96–106)
Creatinine, Ser: 1.79 mg/dL — ABNORMAL HIGH (ref 0.57–1.00)
Globulin, Total: 2.3 g/dL (ref 1.5–4.5)
Glucose: 154 mg/dL — ABNORMAL HIGH (ref 70–99)
Potassium: 4.9 mmol/L (ref 3.5–5.2)
Sodium: 139 mmol/L (ref 134–144)
Total Protein: 7.1 g/dL (ref 6.0–8.5)
eGFR: 29 mL/min/{1.73_m2} — ABNORMAL LOW (ref >59)

## 2022-06-18 ENCOUNTER — Ambulatory Visit (INDEPENDENT_AMBULATORY_CARE_PROVIDER_SITE_OTHER): Payer: Medicare HMO | Admitting: Family Medicine

## 2022-06-18 ENCOUNTER — Encounter: Payer: Self-pay | Admitting: Family Medicine

## 2022-06-18 VITALS — BP 142/60 | HR 59 | Ht 66.0 in | Wt 143.0 lb

## 2022-06-18 DIAGNOSIS — N1832 Chronic kidney disease, stage 3b: Secondary | ICD-10-CM

## 2022-06-18 DIAGNOSIS — I10 Essential (primary) hypertension: Secondary | ICD-10-CM

## 2022-06-18 DIAGNOSIS — E1121 Type 2 diabetes mellitus with diabetic nephropathy: Secondary | ICD-10-CM

## 2022-06-18 NOTE — Patient Instructions (Signed)
F/U in f/u in 4 weeks,  re evaluate blood pressure , flu vaccine at voist  Please stop Spironolactone  Please get non fasting  chem 7 and EGFR  3 days before next visit   Please STOP glipizide  Please commit to eating more often , at least 3 meals per day and  healthy snacks  Thanks for choosing [facility], we consider it a privelige to serve you.

## 2022-06-21 ENCOUNTER — Encounter: Payer: Self-pay | Admitting: Family Medicine

## 2022-06-21 NOTE — Progress Notes (Signed)
VERTA RIEDLINGER     MRN: [id number]      DOB: [birth year]   HPI [name] is here for follow up and re-evaluation of chronic medical conditions, I articular uncontrolled hypertension, medication management and review of any available recent lab and radiology data.  Preventive health is updated, specifically  Cancer screening and Immunization.   Questions or concerns regarding consultations or procedures which the PT has had in the interim are  addressed. The PT denies any adverse reactions to current medications since the last visit.  C/o poor oral intake , poor appetite and weight loss , states she wil do better ROS Denies recent fever or chills. Denies sinus pressure, nasal congestion, ear pain or sore throat. Denies chest congestion, productive cough or wheezing. Denies chest pains, palpitations and leg swelling Denies abdominal pain, nausea, vomiting,diarrhea or constipation.   Denies dysuria, frequency, hesitancy or incontinence. Denies joint pain, swelling and limitation in mobility. Denies headaches, seizures, numbness, or tingling. Denies depression, anxiety or insomnia. Denies skin break down or rash.   PE  BP (!) 142/60 (BP Location: Right Arm, Patient Position: Sitting, Cuff Size: Large)   Pulse (!) 59   Ht 5' 6"  (1.676 m)   Wt 143 lb (64.9 kg)   SpO2 95%   BMI 23.08 kg/m   Patient alert and oriented and in no cardiopulmonary distress.  HEENT: No facial asymmetry, EOMI,     Neck supple .  Chest: Clear to auscultation bilaterally.  CVS: S1, S2 no murmurs, no S3.Regular rate.  ABD: Soft non tender.   Ext: No edema  MS: Adequate ROM spine, shoulders, hips and knees.  Skin: Intact, no ulcerations or rash noted.  Psych: Good eye contact, normal affect. Memory intact not anxious or depressed appearing.  CNS: CN 2-12 intact, power,  normal throughout.no focal deficits noted.   Assessment & Plan  Malignant hypertension Improved , but higher dose associated  with decreased EGFR, change to clonidine and d/c spironolactone with close f/o DASH diet and commitment to daily physical activity for a minimum of 30 minutes discussed and encouraged, as a part of hypertension management. The importance of attaining a healthy weight is also discussed.     06/18/2022    2:18 PM 06/18/2022    2:17 PM 04/14/2022    1:45 PM 12/30/2021   11:03 AM 09/01/2021   11:43 AM 09/01/2021   11:26 AM 05/26/2021    9:51 AM  BP/Weight  Systolic BP 131 438 887 579 728 206 015  Diastolic BP 60 76 83 67 70 76 72  Wt. (Lbs)  143 146.12 152.12  153.12 147  BMI  23.08 kg/m2 23.58 kg/m2 23.83 kg/m2  23.98 kg/m2 23.02 kg/m2       Stage 3b chronic kidney disease acue decompensation with higher dose of spironolactone  Type 2 diabetes mellitus with nephropathy ([place]) [name] is reminded of the importance of commitment to daily physical activity for 30 minutes or more, as able and the need to limit carbohydrate intake to 30 to 60 grams per meal to help with blood sugar control.  [name] is reminded of the importance of daily foot exam, annual eye examination, and good blood sugar, blood pressure and cholesterol control.     Latest Ref Rng & Units 06/15/2022    2:05 PM 04/05/2022    2:35 PM 12/25/2021    1:59 PM 08/25/2021    3:39 PM 04/27/2021   10:21 AM  Diabetic Labs  HbA1c 4.8 -  5.6 %  6.7  7.0  6.9  6.5   Chol 100 - 199 mg/dL  214   201    HDL >39 mg/dL  112   105    Calc LDL 0 - 99 mg/dL  81   80    Triglycerides 0 - 149 mg/dL  128   92    Creatinine 0.57 - 1.00 mg/dL 1.79  1.36  1.41  1.40  1.29       06/18/2022    2:18 PM 06/18/2022    2:17 PM 04/14/2022    1:45 PM 12/30/2021   11:03 AM 09/01/2021   11:43 AM 09/01/2021   11:26 AM 05/26/2021    9:51 AM  BP/Weight  Systolic BP 076 226 333 545 625 638 937  Diastolic BP 60 76 83 67 70 76 72  Wt. (Lbs)  143 146.12 152.12  153.12 147  BMI  23.08 kg/m2 23.58 kg/m2 23.83 kg/m2  23.98 kg/m2 23.02 kg/m2      Latest Ref  Rng & Units 12/29/2020   12:00 AM 12/25/2020    5:10 PM  Foot/eye exam completion dates  Eye Exam No Retinopathy No Retinopathy     No Retinopathy         This result is from an external source.   With poor intake , will d/c glipizide

## 2022-06-21 NOTE — Assessment & Plan Note (Signed)
Mikayla Jenkins is reminded of the importance of commitment to daily physical activity for 30 minutes or more, as able and the need to limit carbohydrate intake to 30 to 60 grams per meal to help with blood sugar control.  Mikayla Jenkins is reminded of the importance of daily foot exam, annual eye examination, and good blood sugar, blood pressure and cholesterol control.     Latest Ref Rng & Units 06/15/2022    2:05 PM 04/05/2022    2:35 PM 12/25/2021    1:59 PM 08/25/2021    3:39 PM 04/27/2021   10:21 AM  Diabetic Labs  HbA1c 4.8 - 5.6 %  6.7  7.0  6.9  6.5   Chol 100 - 199 mg/dL  214   201    HDL >39 mg/dL  112   105    Calc LDL 0 - 99 mg/dL  81   80    Triglycerides 0 - 149 mg/dL  128   92    Creatinine 0.57 - 1.00 mg/dL 1.79  1.36  1.41  1.40  1.29       06/18/2022    2:18 PM 06/18/2022    2:17 PM 04/14/2022    1:45 PM 12/30/2021   11:03 AM 09/01/2021   11:43 AM 09/01/2021   11:26 AM 05/26/2021    9:51 AM  BP/Weight  Systolic BP 915 056 979 480 165 537 482  Diastolic BP 60 76 83 67 70 76 72  Wt. (Lbs)  143 146.12 152.12  153.12 147  BMI  23.08 kg/m2 23.58 kg/m2 23.83 kg/m2  23.98 kg/m2 23.02 kg/m2      Latest Ref Rng & Units 12/29/2020   12:00 AM 12/25/2020    5:10 PM  Foot/eye exam completion dates  Eye Exam No Retinopathy No Retinopathy     No Retinopathy         This result is from an external source.   With poor intake , will d/c glipizide

## 2022-06-21 NOTE — Assessment & Plan Note (Signed)
acue decompensation with higher dose of spironolactone

## 2022-06-21 NOTE — Assessment & Plan Note (Signed)
Improved , but higher dose associated with decreased EGFR, change to clonidine and d/c spironolactone with close f/o DASH diet and commitment to daily physical activity for a minimum of 30 minutes discussed and encouraged, as a part of hypertension management. The importance of attaining a healthy weight is also discussed.     06/18/2022    2:18 PM 06/18/2022    2:17 PM 04/14/2022    1:45 PM 12/30/2021   11:03 AM 09/01/2021   11:43 AM 09/01/2021   11:26 AM 05/26/2021    9:51 AM  BP/Weight  Systolic BP 751 025 852 778 242 353 614  Diastolic BP 60 76 83 67 70 76 72  Wt. (Lbs)  143 146.12 152.12  153.12 147  BMI  23.08 kg/m2 23.58 kg/m2 23.83 kg/m2  23.98 kg/m2 23.02 kg/m2

## 2022-07-05 ENCOUNTER — Telehealth: Payer: Self-pay | Admitting: Family Medicine

## 2022-07-05 ENCOUNTER — Other Ambulatory Visit: Payer: Self-pay | Admitting: Family Medicine

## 2022-07-05 ENCOUNTER — Other Ambulatory Visit: Payer: Self-pay

## 2022-07-05 DIAGNOSIS — I10 Essential (primary) hypertension: Secondary | ICD-10-CM

## 2022-07-05 MED ORDER — CLONIDINE HCL 0.3 MG PO TABS
ORAL_TABLET | ORAL | 0 refills | Status: DC
Start: 2022-07-05 — End: 2022-09-22

## 2022-07-05 NOTE — Telephone Encounter (Signed)
Noted in separate TE.

## 2022-07-05 NOTE — Telephone Encounter (Signed)
Please advice on short supply?

## 2022-07-05 NOTE — Telephone Encounter (Signed)
    [facility] pt   Pt daughter called stated that she is out of cloNIDine (CATAPRES) 0.3 MG tablet. Needs a few to get by until the mail order sends this. She has been out for 2 days. Can you please send in?    Please call pt daughter when this is sent   [facility] [place]

## 2022-07-05 NOTE — Telephone Encounter (Signed)
Pt bp has been running around 140s/70s

## 2022-07-05 NOTE — Telephone Encounter (Signed)
A short supply has been sent to her pharmacy

## 2022-07-05 NOTE — Telephone Encounter (Signed)
What has her BP been?

## 2022-07-05 NOTE — Telephone Encounter (Signed)
Bp readings have been 140s/70s

## 2022-07-06 NOTE — Telephone Encounter (Signed)
Pt informed

## 2022-07-15 DIAGNOSIS — I10 Essential (primary) hypertension: Secondary | ICD-10-CM | POA: Diagnosis not present

## 2022-07-16 LAB — BMP8+EGFR
BUN/Creatinine Ratio: 12 (ref 12–28)
BUN: 15 mg/dL (ref 8–27)
CO2: 23 mmol/L (ref 20–29)
Calcium: 10.1 mg/dL (ref 8.7–10.3)
Chloride: 99 mmol/L (ref 96–106)
Creatinine, Ser: 1.21 mg/dL — ABNORMAL HIGH (ref 0.57–1.00)
Glucose: 166 mg/dL — ABNORMAL HIGH (ref 70–99)
Potassium: 4.5 mmol/L (ref 3.5–5.2)
Sodium: 137 mmol/L (ref 134–144)
eGFR: 46 mL/min/{1.73_m2} — ABNORMAL LOW (ref >59)

## 2022-07-21 ENCOUNTER — Encounter: Payer: Self-pay | Admitting: Family Medicine

## 2022-07-21 ENCOUNTER — Ambulatory Visit (INDEPENDENT_AMBULATORY_CARE_PROVIDER_SITE_OTHER): Payer: Medicare HMO | Admitting: Family Medicine

## 2022-07-21 VITALS — BP 142/80 | HR 80 | Ht 65.0 in | Wt 144.0 lb

## 2022-07-21 DIAGNOSIS — E785 Hyperlipidemia, unspecified: Secondary | ICD-10-CM

## 2022-07-21 DIAGNOSIS — Z23 Encounter for immunization: Secondary | ICD-10-CM | POA: Diagnosis not present

## 2022-07-21 DIAGNOSIS — I10 Essential (primary) hypertension: Secondary | ICD-10-CM | POA: Diagnosis not present

## 2022-07-21 DIAGNOSIS — E1121 Type 2 diabetes mellitus with diabetic nephropathy: Secondary | ICD-10-CM

## 2022-07-21 LAB — POCT GLYCOSYLATED HEMOGLOBIN (HGB A1C): HbA1c, POC (controlled diabetic range): 6.5 % (ref 0.0–7.0)

## 2022-07-21 NOTE — Progress Notes (Signed)
follo

## 2022-07-21 NOTE — Patient Instructions (Signed)
Annual physical exam 3rd week in January, call if ypu need me sooner  Please bring all medication to next visit  Blood pressure and kidney function are much improved , keep doing what you are doing, contiue medications as you are taking them  Need covid vaccine from your pharmacy  GlycoHb today  Fasting lipid, cmp and eGFR and hBA1c 3 days before next visit  It is important that you exercise regularly at least 30 minutes 5 times a week. If you develop chest pain, have severe difficulty breathing, or feel very tired, stop exercising immediately and seek medical attention   Think about what you will eat, plan ahead. Choose " clean, green, fresh or frozen" over canned, processed or packaged foods which are more sugary, salty and fatty. 70 to 75% of food eaten should be vegetables and fruit. Three meals at set times with snacks allowed between meals, but they must be fruit or vegetables. Aim to eat over a 12 hour period , example 7 am to 7 pm, and STOP after  your last meal of the day. Drink water,generally about 64 ounces per day, no other drink is as healthy. Fruit juice is best enjoyed in a healthy way, by EATING the fruit. Thanks for choosing [facility], we consider it a privelige to serve you.

## 2022-07-26 ENCOUNTER — Encounter: Payer: Self-pay | Admitting: Family Medicine

## 2022-07-26 NOTE — Assessment & Plan Note (Signed)
Hyperlipidemia:Low fat diet discussed and encouraged.   Lipid Panel  Lab Results  Component Value Date   CHOL 214 (H) 04/05/2022   HDL 112 04/05/2022   LDLCALC 81 04/05/2022   TRIG 128 04/05/2022   CHOLHDL 1.9 04/05/2022     Needs to reduce fried and fatty foods

## 2022-07-26 NOTE — Assessment & Plan Note (Signed)
Controlled, no change in medication DASH diet and commitment to daily physical activity for a minimum of 30 minutes discussed and encouraged, as a part of hypertension management. The importance of attaining a healthy weight is also discussed.     07/21/2022    1:56 PM 07/21/2022    1:55 PM 06/18/2022    2:18 PM 06/18/2022    2:17 PM 04/14/2022    1:45 PM 12/30/2021   11:03 AM 09/01/2021   11:43 AM  BP/Weight  Systolic BP 211 941 740 814 481 856 314  Diastolic BP 80 82 60 76 83 67 70  Wt. (Lbs)  144  143 146.12 152.12   BMI  23.96 kg/m2  23.08 kg/m2 23.58 kg/m2 23.83 kg/m2

## 2022-07-26 NOTE — Progress Notes (Signed)
Mikayla Jenkins     MRN: [id number]      DOB: 01/24/1946   HPI Mikayla Jenkins is here for follow up and re-evaluation of chronic medical conditions, medication management and review of any available recent lab and radiology data.  Preventive health is updated, specifically  Cancer screening and Immunization.   Questions or concerns regarding consultations or procedures which the PT has had in the interim are  addressed. The PT denies any adverse reactions to current medications since the last visit.  There are no new concerns.  There are no specific complaints   ROS Denies recent fever or chills. Denies sinus pressure, nasal congestion, ear pain or sore throat. Denies chest congestion, productive cough or wheezing. Denies chest pains, palpitations and leg swelling Denies abdominal pain, nausea, vomiting,diarrhea or constipation.   Denies dysuria, frequency, hesitancy or incontinence. Denies joint pain, swelling and limitation in mobility. Denies headaches, seizures, numbness, or tingling. Denies depression, anxiety or insomnia. Denies skin break down or rash.   PE  BP (!) 142/80 (BP Location: Right Arm)   Pulse 80   Ht '5\' 5"'$  (1.651 m)   Wt 144 lb (65.3 kg)   SpO2 95%   BMI 23.96 kg/m   Patient alert and oriented and in no cardiopulmonary distress.  HEENT: No facial asymmetry, EOMI,     Neck supple .  Chest: Clear to auscultation bilaterally.  CVS: S1, S2 no murmurs, no S3.Regular rate.  ABD: Soft non tender.   Ext: No edema  MS: Adequate ROM spine, shoulders, hips and knees.  Skin: Intact, no ulcerations or rash noted.  Psych: Good eye contact, normal affect. Memory intact not anxious or depressed appearing.  CNS: CN 2-12 intact, power,  normal throughout.no focal deficits noted.   Assessment & Plan  Malignant hypertension Controlled, no change in medication DASH diet and commitment to daily physical activity for a minimum of 30 minutes discussed and  encouraged, as a part of hypertension management. The importance of attaining a healthy weight is also discussed.     07/21/2022    1:56 PM 07/21/2022    1:55 PM 06/18/2022    2:18 PM 06/18/2022    2:17 PM 04/14/2022    1:45 PM 12/30/2021   11:03 AM 09/01/2021   11:43 AM  BP/Weight  Systolic BP 601 093 235 573 220 254 270  Diastolic BP 80 82 60 76 83 67 70  Wt. (Lbs)  144  143 146.12 152.12   BMI  23.96 kg/m2  23.08 kg/m2 23.58 kg/m2 23.83 kg/m2        Hyperlipidemia LDL goal <100 Hyperlipidemia:Low fat diet discussed and encouraged.   Lipid Panel  Lab Results  Component Value Date   CHOL 214 (H) 04/05/2022   HDL 112 04/05/2022   LDLCALC 81 04/05/2022   TRIG 128 04/05/2022   CHOLHDL 1.9 04/05/2022     Needs to reduce fried and fatty foods  Type 2 diabetes mellitus with nephropathy ([place]) Mikayla Jenkins is reminded of the importance of commitment to daily physical activity for 30 minutes or more, as able and the need to limit carbohydrate intake to 30 to 60 grams per meal to help with blood sugar control.   The need to take medication as prescribed, test blood sugar as directed, and to call between visits if there is a concern that blood sugar is uncontrolled is also discussed.   Mikayla Jenkins is reminded of the importance of daily foot exam, annual eye examination,  and good blood sugar, blood pressure and cholesterol control. Excellent control, no change in management     Latest Ref Rng & Units 07/21/2022    2:49 PM 07/15/2022    1:34 PM 06/15/2022    2:05 PM 04/05/2022    2:35 PM 12/25/2021    1:59 PM  Diabetic Labs  HbA1c 0.0 - 7.0 % 6.5    6.7  7.0   Chol 100 - 199 mg/dL    214    HDL >39 mg/dL    112    Calc LDL 0 - 99 mg/dL    81    Triglycerides 0 - 149 mg/dL    128    Creatinine 0.57 - 1.00 mg/dL  1.21  1.79  1.36  1.41       07/21/2022    1:56 PM 07/21/2022    1:55 PM 06/18/2022    2:18 PM 06/18/2022    2:17 PM 04/14/2022    1:45 PM 12/30/2021   11:03 AM 09/01/2021    11:43 AM  BP/Weight  Systolic BP 335 456 256 389 373 428 768  Diastolic BP 80 82 60 76 83 67 70  Wt. (Lbs)  144  143 146.12 152.12   BMI  23.96 kg/m2  23.08 kg/m2 23.58 kg/m2 23.83 kg/m2       Latest Ref Rng & Units 12/29/2020   12:00 AM 12/25/2020    5:10 PM  Foot/eye exam completion dates  Eye Exam No Retinopathy No Retinopathy     No Retinopathy         This result is from an external source.

## 2022-07-26 NOTE — Assessment & Plan Note (Signed)
Mikayla Jenkins is reminded of the importance of commitment to daily physical activity for 30 minutes or more, as able and the need to limit carbohydrate intake to 30 to 60 grams per meal to help with blood sugar control.   The need to take medication as prescribed, test blood sugar as directed, and to call between visits if there is a concern that blood sugar is uncontrolled is also discussed.   Mikayla Jenkins is reminded of the importance of daily foot exam, annual eye examination, and good blood sugar, blood pressure and cholesterol control. Excellent control, no change in management     Latest Ref Rng & Units 07/21/2022    2:49 PM 07/15/2022    1:34 PM 06/15/2022    2:05 PM 04/05/2022    2:35 PM 12/25/2021    1:59 PM  Diabetic Labs  HbA1c 0.0 - 7.0 % 6.5    6.7  7.0   Chol 100 - 199 mg/dL    214    HDL >39 mg/dL    112    Calc LDL 0 - 99 mg/dL    81    Triglycerides 0 - 149 mg/dL    128    Creatinine 0.57 - 1.00 mg/dL  1.21  1.79  1.36  1.41       07/21/2022    1:56 PM 07/21/2022    1:55 PM 06/18/2022    2:18 PM 06/18/2022    2:17 PM 04/14/2022    1:45 PM 12/30/2021   11:03 AM 09/01/2021   11:43 AM  BP/Weight  Systolic BP 626 948 546 270 350 093 818  Diastolic BP 80 82 60 76 83 67 70  Wt. (Lbs)  144  143 146.12 152.12   BMI  23.96 kg/m2  23.08 kg/m2 23.58 kg/m2 23.83 kg/m2       Latest Ref Rng & Units 12/29/2020   12:00 AM 12/25/2020    5:10 PM  Foot/eye exam completion dates  Eye Exam No Retinopathy No Retinopathy     No Retinopathy         This result is from an external source.

## 2022-08-04 ENCOUNTER — Other Ambulatory Visit (HOSPITAL_COMMUNITY)
Admission: RE | Admit: 2022-08-04 | Discharge: 2022-08-04 | Disposition: A | Discharge status: Home / Self Care | Payer: Medicare HMO | Source: Ambulatory Visit | Attending: Nephrology | Admitting: Nephrology

## 2022-08-04 DIAGNOSIS — I129 Hypertensive chronic kidney disease with stage 1 through stage 4 chronic kidney disease, or unspecified chronic kidney disease: Secondary | ICD-10-CM | POA: Diagnosis not present

## 2022-08-04 DIAGNOSIS — N189 Chronic kidney disease, unspecified: Secondary | ICD-10-CM | POA: Insufficient documentation

## 2022-08-04 DIAGNOSIS — I130 Hypertensive heart and chronic kidney disease with heart failure and stage 1 through stage 4 chronic kidney disease, or unspecified chronic kidney disease: Secondary | ICD-10-CM | POA: Insufficient documentation

## 2022-08-04 DIAGNOSIS — I5032 Chronic diastolic (congestive) heart failure: Secondary | ICD-10-CM | POA: Insufficient documentation

## 2022-08-04 DIAGNOSIS — D638 Anemia in other chronic diseases classified elsewhere: Secondary | ICD-10-CM | POA: Diagnosis not present

## 2022-08-04 DIAGNOSIS — E1122 Type 2 diabetes mellitus with diabetic chronic kidney disease: Secondary | ICD-10-CM | POA: Diagnosis not present

## 2022-08-04 LAB — RENAL FUNCTION PANEL
Albumin: 4.3 g/dL (ref 3.5–5.0)
Anion gap: 9 (ref 5–15)
BUN: 26 mg/dL — ABNORMAL HIGH (ref 8–23)
CO2: 24 mmol/L (ref 22–32)
Calcium: 9.6 mg/dL (ref 8.9–10.3)
Chloride: 105 mmol/L (ref 98–111)
Creatinine, Ser: 1.34 mg/dL — ABNORMAL HIGH (ref 0.44–1.00)
GFR, Estimated: 41 mL/min — ABNORMAL LOW (ref >60)
Glucose, Bld: 144 mg/dL — ABNORMAL HIGH (ref 70–99)
Phosphorus: 4.2 mg/dL (ref 2.5–4.6)
Potassium: 4 mmol/L (ref 3.5–5.1)
Sodium: 138 mmol/L (ref 135–145)

## 2022-08-04 LAB — CBC
HCT: 34 % — ABNORMAL LOW (ref 36.0–46.0)
Hemoglobin: 11.2 g/dL — ABNORMAL LOW (ref 12.0–15.0)
MCH: 32.2 pg (ref 26.0–34.0)
MCHC: 32.9 g/dL (ref 30.0–36.0)
MCV: 97.7 fL (ref 80.0–100.0)
Platelets: 198 10*3/uL (ref 150–400)
RBC: 3.48 MIL/uL — ABNORMAL LOW (ref 3.87–5.11)
RDW: 12.6 % (ref 11.5–15.5)
WBC: 4.3 10*3/uL (ref 4.0–10.5)
nRBC: 0 % (ref 0.0–0.2)

## 2022-08-05 LAB — VITAMIN D 25 HYDROXY (VIT D DEFICIENCY, FRACTURES)

## 2022-08-12 ENCOUNTER — Encounter: Payer: Self-pay | Admitting: Internal Medicine

## 2022-08-12 ENCOUNTER — Other Ambulatory Visit (HOSPITAL_COMMUNITY)
Admission: RE | Admit: 2022-08-12 | Discharge: 2022-08-12 | Disposition: A | Discharge status: Home / Self Care | Payer: Medicare HMO | Source: Ambulatory Visit | Attending: Internal Medicine | Admitting: Internal Medicine

## 2022-08-12 ENCOUNTER — Ambulatory Visit: Payer: Medicare HMO | Attending: Internal Medicine | Admitting: Internal Medicine

## 2022-08-12 VITALS — BP 182/88 | HR 59 | Ht 67.0 in | Wt 143.6 lb

## 2022-08-12 DIAGNOSIS — I1A0 Resistant hypertension: Secondary | ICD-10-CM | POA: Insufficient documentation

## 2022-08-12 DIAGNOSIS — Z9189 Other specified personal risk factors, not elsewhere classified: Secondary | ICD-10-CM | POA: Diagnosis not present

## 2022-08-12 DIAGNOSIS — I10 Essential (primary) hypertension: Secondary | ICD-10-CM | POA: Insufficient documentation

## 2022-08-12 NOTE — Patient Instructions (Signed)
Medication Instructions:  Your physician recommends that you continue on your current medications as directed. Please refer to the Current Medication list given to you today.   Labwork: Aldo. & Renin Ratio  Testing/Procedures: None  Follow-Up: Follow up with Dr. Dellia Cloud in 1 week.  Any Other Special Instructions Will Be Listed Below (If Applicable).  Please track bp's for 1 week and bring with you to your appointment    If you need a refill on your cardiac medications before your next appointment, please call your pharmacy.

## 2022-08-12 NOTE — Progress Notes (Signed)
Cardiology Office Note  Date: 08/12/2022   ID: Mikayla Jenkins [birth year], MRN [id number]  PCP:  Mikayla Helper, MD  Cardiologist:  Mikayla Sable, MD (Inactive) Electrophysiologist:  None   Reason for Office Visit: Management of hypertension   History of Present Illness: Mikayla Jenkins is a 76 y.o. female known to have HTN, DM 2, HLD is here for follow-up visit.  Patient was diagnosed with resistant hypertension and is currently on multiple antihypertensive medications.  Accompanied by daughter. Her blood pressure machine at home broke and she was not able to check her blood pressure for many months now.  Denied any angina, DOE, dizziness, lightheadedness, palpitations, LE swelling.  She also denied any symptoms of OSA like insomnia, extreme fatigue, morning headaches, taking short naps throughout the day. Denies smoking any cigarettes, illicit drug use or alcohol use.  Per chart review, patient's HTN is currently managed by PCP, nephrology and cardiology. Spironolactone was taken off due to worsening CKD and clonidine was added by PCP. Hydralazine was taken off by nephrology.  Renal Doppler in 2018 was normal. Daughter insisting that secondary causes of hypertension need to be worked up.  Past Medical History:  Diagnosis Date   Diabetes mellitus type II    Hyperlipidemia    Hypertension    Renal disorder     Past Surgical History:  Procedure Laterality Date   ABDOMINAL HYSTERECTOMY     COLONOSCOPY N/A 06/13/2015   Procedure: COLONOSCOPY;  Surgeon: Rogene Houston, MD;  [facility]: [facility];  Service: Endoscopy;  Laterality: N/A;  930-rescheduled 8/26 @ 8:30am Ann to notify pt   HERNIA REPAIR     inguinal herniorrhapy right     right eye surgery secondary to right eye weakness     SALPINGOOPHORECTOMY     TOTAL HIP ARTHROPLASTY Right 01/06/2021   TOTAL HIP ARTHROPLASTY Right 01/06/2021   Procedure: RIGHT TOTAL HIP ARTHROPLASTY ANTERIOR APPROACH;  Surgeon: Mcarthur Rossetti, MD;  Location: [facility];  Service: Orthopedics;  Laterality: Right;   TUBAL LIGATION      Current Outpatient Medications  Medication Sig Dispense Refill   acetaminophen (TYLENOL) 650 MG CR tablet Take 650 mg by mouth every 8 (eight) hours as needed for pain.     amLODipine (NORVASC) 10 MG tablet TAKE 1 TABLET EVERY DAY 90 tablet 1   aspirin 81 MG chewable tablet Chew 1 tablet (81 mg total) by mouth 2 (two) times daily. (Patient taking differently: Chew 81 mg by mouth daily.) 30 tablet 0   atenolol-chlorthalidone (TENORETIC) 100-25 MG tablet TAKE 1 TABLET EVERY DAY 90 tablet 1   benazepril (LOTENSIN) 40 MG tablet TAKE 1 TABLET EVERY DAY 90 tablet 1   blood glucose meter kit and supplies Dispense based on patient and insurance preference. Use up to four times daily as directed. (FOR ICD-10 E10.9, E11.9). 1 each 0   blood glucose meter kit and supplies Dispense based on patient and insurance preference  Accuchek guide meter and supplies once daily testing dx e11.9 1 each 0   Calcium Carbonate-Vitamin D 600-400 MG-UNIT tablet Take 1 tablet by mouth daily.     cloNIDine (CATAPRES) 0.3 MG tablet Take 1 and 1/2 tablet at bedtime 7 tablet 0   lovastatin (MEVACOR) 40 MG tablet TAKE 1 TABLET EVERY DAY 90 tablet 1   Multiple Vitamin (MULTIVITAMIN) tablet Take 1 tablet by mouth daily.     hydrALAZINE (APRESOLINE) 25 MG tablet Take 25 mg by mouth 4 (four)  times daily. (Patient not taking: Reported on 08/12/2022)     No current facility-administered medications for this visit.   Allergies:  Metformin and related   Social History: The patient  reports that she has never smoked. She has never used smokeless tobacco. She reports that she does not drink alcohol and does not use drugs.   Family History: The patient's family history includes COPD in her sister; Diabetes in her mother and sister; Fibroids in her daughter; Hypertension in her mother; Pneumonia in her brother; Stroke in her brother,  sister, and sister.   ROS:  Please see the history of present illness. Otherwise, complete review of systems is positive for none.  All other systems are reviewed and negative.   Physical Exam: VS:  BP (!) 182/88   Pulse (!) 59   Ht _0  (1.702 m)   Wt 143 lb 9.6 oz (65.1 kg)   SpO2 98%   BMI 22.49 kg/m , BMI Body mass index is 22.49 kg/m.  Wt Readings from Last 3 Encounters:  08/12/22 143 lb 9.6 oz (65.1 kg)  07/21/22 144 lb (65.3 kg)  06/18/22 143 lb (64.9 kg)    General: Patient appears comfortable at rest. HEENT: Conjunctiva and lids normal, oropharynx clear with moist mucosa. Neck: Supple, no elevated JVP or carotid bruits, no thyromegaly. Lungs: Clear to auscultation, nonlabored breathing at rest. Cardiac: Regular rate and rhythm, no S3 or significant systolic murmur, no pericardial rub. Abdomen: Soft, nontender, no hepatomegaly, bowel sounds present, no guarding or rebound. Extremities: No pitting edema, distal pulses 2+. Skin: Warm and dry. Musculoskeletal: No kyphosis. Neuropsychiatric: Alert and oriented x3, affect grossly appropriate.  ECG:  An ECG dated 08/12/2022 was personally reviewed today and demonstrated:  Bradycardia  Recent Labwork: 06/15/2022: ALT 12; AST 19 08/04/2022: BUN 26; Creatinine, Ser 1.34; Hemoglobin 11.2; Platelets 198; Potassium 4.0; Sodium 138     Component Value Date/Time   CHOL 214 (H) 04/05/2022 1435   TRIG 128 04/05/2022 1435   HDL 112 04/05/2022 1435   CHOLHDL 1.9 04/05/2022 1435   CHOLHDL 2.1 08/13/2020 1316   VLDL 13 12/23/2016 1141   LDLCALC 81 04/05/2022 1435   LDLCALC 89 08/13/2020 1316    Other Studies Reviewed Today: Echo in 2018 Study Conclusions  - Left ventricle: The cavity size was normal. Wall thickness was    increased in a pattern of mild LVH. Systolic function was normal.    The estimated ejection fraction was in the range of 55% to 60%.    Doppler parameters are consistent with abnormal left ventricular     relaxation (grade 1 diastolic dysfunction).  - Aortic valve: Valve area (VTI): 2.73 cm^2. Valve area (Vmax):    2.17 cm^2. Valve area (Vmean): 2.31 cm^2.  - Technically adequate study.   Renal artery bilateral Doppler in 2018 Renal Normal size right renal artery. Normal right RI. No evidence of right  renal artery stenosis. Normal cortical thickness of right kidney. Normal size of  left renal artery. Normal left RI. Normal cortical thickness of the left  kidney. No evidence of left renal artery stenosis.  Assessment and Plan: Patient is a 76 year old F known to have HTN, DM 2, HLD present to the cardiology clinic for follow-up visit of hypertension.  #HTN, currently not at goal (goal less than 140/90 mmHg) Plan -Blood pressure readings in the clinic are elevated however I do not have any home blood pressure readings at this time. Instructed patient to return back in 1 week  with home blood pressure readings. I would recommend to continue the current antihypertensive medications in the meantime. -Continue amlodipine 10 mg once daily -Continue atenolol-chlorthalidone 100-25 mg once daily -Continue benazepril 40 mg once daily -Patient's daughter insisting to work-up for secondary causes of hypertension. Patient does not meet criteria for OSA evaluation. I will refer her to pulmonology for sleep study upon daughter's request. Obtain renin aldosterone ratio.  #HLD, goal less than 100 Plan -Continue lovastatin 40 mg nightly  I have spent a total of 35 minutes with patient reviewing chart, EKGs, labs and examining patient as well as establishing an assessment and plan that was discussed with the patient.  > 50% of time was spent in direct patient care.      Medication Adjustments/Labs and Tests Ordered: Current medicines are reviewed at length with the patient today.  Concerns regarding medicines are outlined above.   Tests Ordered: Orders Placed This Encounter  Procedures   Aldosterone +  renin activity w/ ratio   EKG 12-Lead    Medication Changes: No orders of the defined types were placed in this encounter.   Disposition:  Follow up  1 week  Signed, Anyi Fels Fidel Levy, MD, 08/12/2022 1:29 PM    [facility] [place] [address] [place], [place] [address]

## 2022-08-17 DIAGNOSIS — I129 Hypertensive chronic kidney disease with stage 1 through stage 4 chronic kidney disease, or unspecified chronic kidney disease: Secondary | ICD-10-CM | POA: Diagnosis not present

## 2022-08-17 DIAGNOSIS — E1122 Type 2 diabetes mellitus with diabetic chronic kidney disease: Secondary | ICD-10-CM | POA: Diagnosis not present

## 2022-08-17 DIAGNOSIS — D638 Anemia in other chronic diseases classified elsewhere: Secondary | ICD-10-CM | POA: Diagnosis not present

## 2022-08-17 DIAGNOSIS — I5032 Chronic diastolic (congestive) heart failure: Secondary | ICD-10-CM | POA: Diagnosis not present

## 2022-08-17 DIAGNOSIS — N189 Chronic kidney disease, unspecified: Secondary | ICD-10-CM | POA: Diagnosis not present

## 2022-08-17 LAB — ALDOSTERONE + RENIN ACTIVITY W/ RATIO
ALDO / PRA Ratio: 5.6 (ref 0.0–30.0)
Aldosterone: 9.2 ng/dL (ref 0.0–30.0)
PRA LC/MS/MS: 1.649 ng/mL/hr (ref 0.167–5.380)

## 2022-08-18 ENCOUNTER — Telehealth: Payer: Self-pay

## 2022-08-18 NOTE — Telephone Encounter (Signed)
-----   Message from Chalmers Guest, MD sent at 08/18/2022 12:42 PM EDT ----- Normal aldosterone renin ratio.

## 2022-08-18 NOTE — Telephone Encounter (Signed)
Patient notified and verbalized understanding. Patient had no questions or concerns at this time.  

## 2022-08-19 ENCOUNTER — Ambulatory Visit: Payer: Medicare HMO | Attending: Internal Medicine | Admitting: Internal Medicine

## 2022-08-19 VITALS — BP 182/86 | HR 58 | Ht 67.0 in | Wt 140.2 lb

## 2022-08-19 DIAGNOSIS — I1A0 Resistant hypertension: Secondary | ICD-10-CM

## 2022-08-19 MED ORDER — CARVEDILOL 6.25 MG PO TABS: 6.2500 mg | ORAL_TABLET | Freq: Two times a day (BID) | ORAL | 0 refills | Status: DC

## 2022-08-19 MED ORDER — CHLORTHALIDONE 25 MG PO TABS: 25.0000 mg | ORAL_TABLET | Freq: Every day | ORAL | 1 refills | Status: DC

## 2022-08-19 MED ORDER — CARVEDILOL 6.25 MG PO TABS: 6.2500 mg | ORAL_TABLET | Freq: Two times a day (BID) | ORAL | 1 refills | Status: DC

## 2022-08-19 MED ORDER — CHLORTHALIDONE 25 MG PO TABS: 25.0000 mg | ORAL_TABLET | Freq: Every day | ORAL | 0 refills | Status: DC

## 2022-08-19 NOTE — Patient Instructions (Addendum)
Medication Instructions:  Your physician has recommended you make the following change in your medication:  Stop atenolol/chlorthalidone Start chlorthalidone 25 mg daily tomorrow Start carvedilol 6.25 mg twice daily on Sunday, 08/22/2022 Continue other medications the same  Labwork: none  Testing/Procedures: none  Follow-Up: Your physician recommends that you schedule a follow-up appointment in: 3 months  Any Other Special Instructions Will Be Listed Below (If Applicable).  If you need a refill on your cardiac medications before your next appointment, please call your pharmacy.

## 2022-08-19 NOTE — Progress Notes (Signed)
Cardiology Office Note  Date: 08/19/2022   ID: Mikayla Jenkins, DOB February 13, 1946, MRN [id number]  PCP:  Chalmers Guest, MD  Cardiologist:  None Electrophysiologist:  None   Reason for Office Visit: Management of hypertension   History of Present Illness: Mikayla Jenkins is a 76 y.o. female known to have HTN, DM 2, HLD is here for follow-up visit.  Patient was diagnosed with resistant hypertension and is currently on multiple antihypertensive medications.  Accompanied by daughter. Per chart review, patient's HTN is currently managed by PCP, nephrology and cardiology. Spironolactone was taken off due to worsening CKD and clonidine was added by PCP. Hydralazine was taken off by nephrology.  Renal Doppler in 2018 was normal. Daughter insisting that secondary causes of hypertension need to be worked up.  Aldosterone renin ratio was normal.  Patient checked her blood pressures in the morning daily before she took her blood pressure medications. The blood pressures were ranging from 140-154 SBP. Denies any angina, DOE, dizziness/lightheadedness, syncope, palpitations, LE swelling.    Past Medical History:  Diagnosis Date   Diabetes mellitus type II    Hyperlipidemia    Hypertension    Renal disorder     Past Surgical History:  Procedure Laterality Date   ABDOMINAL HYSTERECTOMY     COLONOSCOPY N/A 06/13/2015   Procedure: COLONOSCOPY;  Surgeon: Rogene Houston, MD;  [facility];  Service: Endoscopy;  Laterality: N/A;  930-rescheduled 8/26 @ 8:30am Ann to notify pt   HERNIA REPAIR     inguinal herniorrhapy right     right eye surgery secondary to right eye weakness     SALPINGOOPHORECTOMY     TOTAL HIP ARTHROPLASTY Right 01/06/2021   TOTAL HIP ARTHROPLASTY Right 01/06/2021   Procedure: RIGHT TOTAL HIP ARTHROPLASTY ANTERIOR APPROACH;  Surgeon: Mcarthur Rossetti, MD;  Location: [facility];  Service: Orthopedics;  Laterality: Right;   TUBAL LIGATION      Current Outpatient  Medications  Medication Sig Dispense Refill   acetaminophen (TYLENOL) 650 MG CR tablet Take 650 mg by mouth every 8 (eight) hours as needed for pain.     amLODipine (NORVASC) 10 MG tablet TAKE 1 TABLET EVERY DAY 90 tablet 1   aspirin EC 81 MG tablet Take 81 mg by mouth daily.     benazepril (LOTENSIN) 40 MG tablet TAKE 1 TABLET EVERY DAY 90 tablet 1   blood glucose meter kit and supplies Dispense based on patient and insurance preference. Use up to four times daily as directed. (FOR ICD-10 E10.9, E11.9). 1 each 0   blood glucose meter kit and supplies Dispense based on patient and insurance preference  Accuchek guide meter and supplies once daily testing dx e11.9 1 each 0   Calcium Carbonate-Vitamin D 600-400 MG-UNIT tablet Take 1 tablet by mouth daily.     cloNIDine (CATAPRES) 0.3 MG tablet Take 1 and 1/2 tablet at bedtime 7 tablet 0   lovastatin (MEVACOR) 40 MG tablet TAKE 1 TABLET EVERY DAY 90 tablet 1   Multiple Vitamin (MULTIVITAMIN) tablet Take 1 tablet by mouth daily.     [START ON 08/22/2022] carvedilol (COREG) 6.25 MG tablet Take 1 tablet (6.25 mg total) by mouth 2 (two) times daily. 180 tablet 1   [START ON 08/20/2022] chlorthalidone (HYGROTON) 25 MG tablet Take 1 tablet (25 mg total) by mouth daily. 90 tablet 1   hydrALAZINE (APRESOLINE) 25 MG tablet Take 25 mg by mouth 4 (four) times daily. (Patient not taking: Reported on  08/12/2022)     No current facility-administered medications for this visit.   Allergies:  Metformin and related   Social History: The patient  reports that she has never smoked. She has never used smokeless tobacco. She reports that she does not drink alcohol and does not use drugs.   Family History: The patient's family history includes COPD in her sister; Diabetes in her mother and sister; Fibroids in her daughter; Hypertension in her mother; Pneumonia in her brother; Stroke in her brother, sister, and sister.   ROS:  Please see the history of present illness.  Otherwise, complete review of systems is positive for none.  All other systems are reviewed and negative.   Physical Exam: VS:  BP (!) 182/86 (BP Location: Right Arm, Cuff Size: Normal)   Pulse (!) 58   Ht _0  (1.702 m)   Wt 140 lb 3.2 oz (63.6 kg)   SpO2 99%   BMI 21.96 kg/m , BMI Body mass index is 21.96 kg/m.  Wt Readings from Last 3 Encounters:  08/19/22 140 lb 3.2 oz (63.6 kg)  08/12/22 143 lb 9.6 oz (65.1 kg)  07/21/22 144 lb (65.3 kg)    General: Patient appears comfortable at rest. HEENT: Conjunctiva and lids normal, oropharynx clear with moist mucosa. Neck: Supple, no elevated JVP or carotid bruits, no thyromegaly. Lungs: Clear to auscultation, nonlabored breathing at rest. Cardiac: Regular rate and rhythm, no S3 or significant systolic murmur, no pericardial rub. Abdomen: Soft, nontender, no hepatomegaly, bowel sounds present, no guarding or rebound. Extremities: No pitting edema, distal pulses 2+. Skin: Warm and dry. Musculoskeletal: No kyphosis. Neuropsychiatric: Alert and oriented x3, affect grossly appropriate.  ECG:  An ECG dated 08/12/2022 was personally reviewed today and demonstrated:  Bradycardia  Recent Labwork: 06/15/2022: ALT 12; AST 19 08/04/2022: BUN 26; Creatinine, Ser 1.34; Hemoglobin 11.2; Platelets 198; Potassium 4.0; Sodium 138     Component Value Date/Time   CHOL 214 (H) 04/05/2022 1435   TRIG 128 04/05/2022 1435   HDL 112 04/05/2022 1435   CHOLHDL 1.9 04/05/2022 1435   CHOLHDL 2.1 08/13/2020 1316   VLDL 13 12/23/2016 1141   LDLCALC 81 04/05/2022 1435   LDLCALC 89 08/13/2020 1316    Other Studies Reviewed Today: Echo in 2018 Study Conclusions  - Left ventricle: The cavity size was normal. Wall thickness was    increased in a pattern of mild LVH. Systolic function was normal.    The estimated ejection fraction was in the range of 55% to 60%.    Doppler parameters are consistent with abnormal left ventricular    relaxation (grade 1  diastolic dysfunction).  - Aortic valve: Valve area (VTI): 2.73 cm^2. Valve area (Vmax):    2.17 cm^2. Valve area (Vmean): 2.31 cm^2.  - Technically adequate study.   Renal artery bilateral Doppler in 2018 Renal Normal size right renal artery. Normal right RI. No evidence of right  renal artery stenosis. Normal cortical thickness of right kidney. Normal size of  left renal artery. Normal left RI. Normal cortical thickness of the left  kidney. No evidence of left renal artery stenosis.  Assessment and Plan: Patient is a 76 year old F known to have HTN, DM 2, HLD present to the cardiology clinic for follow-up visit of hypertension.  #HTN, currently not at goal (goal less than 140/90 mmHg) Plan -Home blood pressure readings in the morning ranged from 140-150 mm Hg for SBP -Continue amlodipine 10 mg once daily -Discontinue the combination medication, atenolol-chlorthalidone 100-25 mg  once daily and start chlorthalidone 25 mg once daily -Start carvedilol 6.25 mg twice daily (2 days after stopping atenolol) -Continue benazepril 40 mg once daily -Patient is being referred to pulmonology for OSA evaluation upon daughter's request.   #HLD, goal less than 100 Plan -Continue lovastatin 40 mg nightly  I have spent a total of 35 minutes with patient reviewing chart, EKGs, labs and examining patient as well as establishing an assessment and plan that was discussed with the patient.  > 50% of time was spent in direct patient care.      Medication Adjustments/Labs and Tests Ordered: Current medicines are reviewed at length with the patient today.  Concerns regarding medicines are outlined above.   Tests Ordered: No orders of the defined types were placed in this encounter.   Medication Changes: Meds ordered this encounter  Medications   DISCONTD: chlorthalidone (HYGROTON) 25 MG tablet    Sig: Take 1 tablet (25 mg total) by mouth daily.    Dispense:  30 tablet    Refill:  0    08/19/2022  NEW-stop tenoretic   DISCONTD: carvedilol (COREG) 6.25 MG tablet    Sig: Take 1 tablet (6.25 mg total) by mouth 2 (two) times daily.    Dispense:  60 tablet    Refill:  0    08/19/2022 NEW-stop tenoretic   chlorthalidone (HYGROTON) 25 MG tablet    Sig: Take 1 tablet (25 mg total) by mouth daily.    Dispense:  90 tablet    Refill:  1    08/19/2022 NEW-stop tenoretic   carvedilol (COREG) 6.25 MG tablet    Sig: Take 1 tablet (6.25 mg total) by mouth 2 (two) times daily.    Dispense:  180 tablet    Refill:  1    08/19/2022 NEW-stop tenoretic    Disposition:  Follow up 3 months  Signed, Jazyah Butsch Fidel Levy, MD, 08/19/2022 8:08 PM    [facility] [place] [address], [place], [place] [address]

## 2022-09-21 ENCOUNTER — Other Ambulatory Visit: Payer: Self-pay | Admitting: Family Medicine

## 2022-09-21 DIAGNOSIS — I10 Essential (primary) hypertension: Secondary | ICD-10-CM

## 2022-09-28 ENCOUNTER — Other Ambulatory Visit (HOSPITAL_COMMUNITY): Payer: Self-pay | Admitting: Family Medicine

## 2022-09-28 DIAGNOSIS — Z1231 Encounter for screening mammogram for malignant neoplasm of breast: Secondary | ICD-10-CM

## 2022-10-07 ENCOUNTER — Encounter: Payer: Self-pay | Admitting: Pulmonary Disease

## 2022-10-07 ENCOUNTER — Ambulatory Visit (INDEPENDENT_AMBULATORY_CARE_PROVIDER_SITE_OTHER): Payer: Medicare HMO | Admitting: Pulmonary Disease

## 2022-10-07 VITALS — BP 144/84 | HR 70 | Temp 97.6°F | Ht 67.0 in | Wt 142.6 lb

## 2022-10-07 DIAGNOSIS — R0902 Hypoxemia: Secondary | ICD-10-CM | POA: Diagnosis not present

## 2022-10-07 DIAGNOSIS — I1A0 Resistant hypertension: Secondary | ICD-10-CM | POA: Diagnosis not present

## 2022-10-07 DIAGNOSIS — R0683 Snoring: Secondary | ICD-10-CM | POA: Diagnosis not present

## 2022-10-07 NOTE — Progress Notes (Signed)
Subjective:    Patient ID: Mikayla Jenkins, female    DOB: [birth year], 76 y.o.   MRN: [id number]  HPI  76 year old woman with refractory hypertension referred for evaluation of sleep disordered breathing. She is accompanied by her daughter AMG who is a Software engineer and corroborates history. 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 She has had hypertension for more than 40 years but more recently has required 4 medications.  I reviewed cardiology consultation where medications have been changed and she is currently on amlodipine, benazepril, chlorthalidone and carvedilol and clonidine at bedtime.  She also sees nephrology for CKD.  Epworth sleepiness score is 5 and she reports some sleepiness while watching TV or lying down to rest in the afternoon.  She takes her clonidine at bedtime which can be between 1030 and 11:30 PM, sleep latency is minimal, she sleeps on her left side with 1 pillow, denies nocturnal awakenings due to the clonidine and is out of bed by 10 AM although she may wake up before that but stay in bed. Her weight has decreased 25 pounds over the last 2 years. There is no history suggestive of cataplexy, sleep paralysis or parasomnias  No witnessed apneas by daughter or husband She denies choking or gasping episodes in her sleep    Past Medical History:  Diagnosis Date   Diabetes mellitus type II    Hyperlipidemia    Hypertension    Renal disorder     Past Surgical History:  Procedure Laterality Date   ABDOMINAL HYSTERECTOMY     COLONOSCOPY N/A 06/13/2015   Procedure: COLONOSCOPY;  Surgeon: Rogene Houston, MD;  [facility];  Service: Endoscopy;  Laterality: N/A;  930-rescheduled 8/26 @ 8:30am Ann to notify pt   HERNIA REPAIR     inguinal herniorrhapy right     right eye surgery secondary to right eye weakness     SALPINGOOPHORECTOMY     TOTAL HIP ARTHROPLASTY Right 01/06/2021   TOTAL HIP ARTHROPLASTY Right 01/06/2021   Procedure: RIGHT TOTAL  HIP ARTHROPLASTY ANTERIOR APPROACH;  Surgeon: Mcarthur Rossetti, MD;  Location: [facility];  Service: Orthopedics;  Laterality: Right;   TUBAL LIGATION      Allergies  Allergen Reactions   Metformin And Related Other (See Comments)    CKD stage 4    Social History   Socioeconomic History   Marital status: Married    Spouse name: Not on file   Number of children: Not on file   Years of education: Not on file   Highest education level: High school graduate  Occupational History   Not on file  Tobacco Use   Smoking status: Never   Smokeless tobacco: Never  Vaping Use   Vaping Use: Never used  Substance and Sexual Activity   Alcohol use: No   Drug use: No   Sexual activity: Yes    Birth control/protection: Surgical  Other Topics Concern   Not on file  Social History Narrative   Not on file   Social Determinants of Health   Financial Resource Strain: Low Risk  (05/31/2022)   Overall Financial Resource Strain (CARDIA)    Difficulty of Paying Living Expenses: Not hard at all  Food Insecurity: No Food Insecurity (05/31/2022)   Hunger Vital Sign    Worried About Running Out of Food in the Last Year: Never true    Ran Out of Food in the Last Year: Never true  Transportation Needs: No Transportation Needs (05/31/2022)  PRAPARE - Hydrologist (Medical): No    Lack of Transportation (Non-Medical): No  Physical Activity: Insufficiently Active (05/31/2022)   Exercise Vital Sign    Days of Exercise per Week: 3 days    Minutes of Exercise per Session: 30 min  Stress: No Stress Concern Present (05/31/2022)   [place]    Feeling of Stress : Not at all  Social Connections: Moderately Integrated (05/31/2022)   Social Connection and Isolation Panel [NHANES]    Frequency of Communication with Friends and Family: More than three times a week    Frequency of Social Gatherings with Friends and  Family: Once a week    Attends Religious Services: More than 4 times per year    Active Member of [employer] or Organizations: No    Attends Archivist Meetings: Never    Marital Status: Married  Human resources officer Violence: Not At Risk (05/31/2022)   Humiliation, Afraid, Rape, and Kick questionnaire    Fear of Current or Ex-Partner: No    Emotionally Abused: No    Physically Abused: No    Sexually Abused: No    Family History  Problem Relation Age of Onset   Pneumonia Brother        on continuous O2   Stroke Brother    Stroke Sister    COPD Sister    Diabetes Sister    Stroke Sister    Hypertension Mother    Diabetes Mother    Fibroids Daughter        uterine      Review of Systems Constitutional: negative for anorexia, fevers and sweats  Eyes: negative for irritation, redness and visual disturbance  Ears, nose, mouth, throat, and face: negative for earaches, epistaxis, nasal congestion and sore throat  Respiratory: negative for cough, dyspnea on exertion, sputum and wheezing  Cardiovascular: negative for chest pain, dyspnea, lower extremity edema, orthopnea, palpitations and syncope  Gastrointestinal: negative for abdominal pain, constipation, diarrhea, melena, nausea and vomiting  Genitourinary:negative for dysuria, frequency and hematuria  Hematologic/lymphatic: negative for bleeding, easy bruising and lymphadenopathy  Musculoskeletal:negative for arthralgias, muscle weakness and stiff joints  Neurological: negative for coordination problems, gait problems, headaches and weakness  Endocrine: negative for diabetic symptoms including polydipsia, polyuria and weight loss     Objective:   Physical Exam  Gen. Pleasant, thin elderly woman, in no distress, normal affect ENT - no pallor,icterus, no post nasal drip, dentures Neck: No JVD, no thyromegaly, no carotid bruits Lungs: no use of accessory muscles, no dullness to percussion, clear without rales or rhonchi   Cardiovascular: Rhythm regular, heart sounds  normal, no murmurs or gallops, no peripheral edema Abdomen: soft and non-tender, no hepatosplenomegaly, BS normal. Musculoskeletal: No deformities, no cyanosis or clubbing Neuro:  alert, non focal       Assessment & Plan:   Sleep disordered breathing -due to history of refractory hypertension, it is reasonable to screen for sleep disordered breathing with a home sleep test.  She does have hypersomnolence but this is attributable to clonidine. Pretest probability is low to intermediate  The pathophysiology of obstructive sleep apnea , it's cardiovascular consequences & modes of treatment including CPAP were discused with the patient in detail & they evidenced understanding.

## 2022-10-07 NOTE — Patient Instructions (Signed)
X home sleep test °

## 2022-10-07 NOTE — Assessment & Plan Note (Signed)
Appears refractory, maintained on 5 medications including clonidine

## 2022-10-20 ENCOUNTER — Ambulatory Visit (HOSPITAL_COMMUNITY)
Admission: RE | Admit: 2022-10-20 | Discharge: 2022-10-20 | Disposition: A | Discharge status: Home / Self Care | Payer: Medicare HMO | Source: Ambulatory Visit | Attending: Family Medicine | Admitting: Family Medicine

## 2022-10-20 DIAGNOSIS — Z1231 Encounter for screening mammogram for malignant neoplasm of breast: Secondary | ICD-10-CM | POA: Insufficient documentation

## 2022-11-03 ENCOUNTER — Encounter: Payer: Self-pay | Admitting: Family Medicine

## 2022-11-04 DIAGNOSIS — E559 Vitamin D deficiency, unspecified: Secondary | ICD-10-CM | POA: Diagnosis not present

## 2022-11-04 DIAGNOSIS — E538 Deficiency of other specified B group vitamins: Secondary | ICD-10-CM | POA: Diagnosis not present

## 2022-11-04 DIAGNOSIS — E785 Hyperlipidemia, unspecified: Secondary | ICD-10-CM | POA: Diagnosis not present

## 2022-11-04 DIAGNOSIS — E1121 Type 2 diabetes mellitus with diabetic nephropathy: Secondary | ICD-10-CM | POA: Diagnosis not present

## 2022-11-05 ENCOUNTER — Other Ambulatory Visit: Payer: Self-pay

## 2022-11-05 DIAGNOSIS — E559 Vitamin D deficiency, unspecified: Secondary | ICD-10-CM

## 2022-11-05 DIAGNOSIS — E538 Deficiency of other specified B group vitamins: Secondary | ICD-10-CM

## 2022-11-05 LAB — CMP14+EGFR
ALT: 13 IU/L (ref 0–32)
AST: 19 IU/L (ref 0–40)
Albumin/Globulin Ratio: 2.4 — ABNORMAL HIGH (ref 1.2–2.2)
Albumin: 4.8 g/dL (ref 3.8–4.8)
Alkaline Phosphatase: 106 IU/L (ref 44–121)
BUN/Creatinine Ratio: 12 (ref 12–28)
BUN: 19 mg/dL (ref 8–27)
Bilirubin Total: 0.6 mg/dL (ref 0.0–1.2)
CO2: 24 mmol/L (ref 20–29)
Calcium: 9.9 mg/dL (ref 8.7–10.3)
Chloride: 100 mmol/L (ref 96–106)
Creatinine, Ser: 1.55 mg/dL — ABNORMAL HIGH (ref 0.57–1.00)
Globulin, Total: 2 g/dL (ref 1.5–4.5)
Glucose: 214 mg/dL — ABNORMAL HIGH (ref 70–99)
Potassium: 4.2 mmol/L (ref 3.5–5.2)
Sodium: 139 mmol/L (ref 134–144)
Total Protein: 6.8 g/dL (ref 6.0–8.5)
eGFR: 34 mL/min/{1.73_m2} — ABNORMAL LOW (ref >59)

## 2022-11-05 LAB — HEMOGLOBIN A1C
Est. average glucose Bld gHb Est-mCnc: 177 mg/dL
Hgb A1c MFr Bld: 7.8 % — ABNORMAL HIGH (ref 4.8–5.6)

## 2022-11-05 LAB — LIPID PANEL
Chol/HDL Ratio: 1.9 ratio (ref 0.0–4.4)
Cholesterol, Total: 210 mg/dL — ABNORMAL HIGH (ref 100–199)
HDL: 111 mg/dL (ref >39)
LDL Chol Calc (NIH): 85 mg/dL (ref 0–99)
Triglycerides: 80 mg/dL (ref 0–149)
VLDL Cholesterol Cal: 14 mg/dL (ref 5–40)

## 2022-11-11 ENCOUNTER — Encounter: Payer: Self-pay | Admitting: Family Medicine

## 2022-11-11 ENCOUNTER — Ambulatory Visit (INDEPENDENT_AMBULATORY_CARE_PROVIDER_SITE_OTHER): Payer: Medicare HMO | Admitting: Family Medicine

## 2022-11-11 VITALS — BP 158/94 | HR 82 | Ht 67.0 in | Wt 141.0 lb

## 2022-11-11 DIAGNOSIS — R6881 Early satiety: Secondary | ICD-10-CM

## 2022-11-11 DIAGNOSIS — I10 Essential (primary) hypertension: Secondary | ICD-10-CM | POA: Diagnosis not present

## 2022-11-11 DIAGNOSIS — R634 Abnormal weight loss: Secondary | ICD-10-CM | POA: Diagnosis not present

## 2022-11-11 DIAGNOSIS — E785 Hyperlipidemia, unspecified: Secondary | ICD-10-CM

## 2022-11-11 DIAGNOSIS — E1121 Type 2 diabetes mellitus with diabetic nephropathy: Secondary | ICD-10-CM

## 2022-11-11 DIAGNOSIS — R300 Dysuria: Secondary | ICD-10-CM | POA: Diagnosis not present

## 2022-11-11 DIAGNOSIS — D631 Anemia in chronic kidney disease: Secondary | ICD-10-CM | POA: Diagnosis not present

## 2022-11-11 DIAGNOSIS — I1A0 Resistant hypertension: Secondary | ICD-10-CM | POA: Diagnosis not present

## 2022-11-11 DIAGNOSIS — N189 Chronic kidney disease, unspecified: Secondary | ICD-10-CM | POA: Diagnosis not present

## 2022-11-11 DIAGNOSIS — N1832 Chronic kidney disease, stage 3b: Secondary | ICD-10-CM | POA: Diagnosis not present

## 2022-11-11 LAB — POCT URINALYSIS DIP (CLINITEK)
Bilirubin, UA: NEGATIVE
Blood, UA: NEGATIVE
Glucose, UA: 100 mg/dL — AB
Ketones, POC UA: NEGATIVE mg/dL
Leukocytes, UA: NEGATIVE
Nitrite, UA: NEGATIVE
POC PROTEIN,UA: NEGATIVE
Spec Grav, UA: 1.015 (ref 1.010–1.025)
Urobilinogen, UA: 0.2 E.U./dL
pH, UA: 6.5 (ref 5.0–8.0)

## 2022-11-11 NOTE — Patient Instructions (Addendum)
F/U in 8 weeks with blood sugar log, call if you need me sooner  Goal for fasting blood sugar is 100 to 140, please check every day and record ( nurse pls provide log)  Please commit to eating regularly  Start glipizide ER 2.5 mg one tablet every Monday, Wednesday and Friday  Try and get back to the [facility] please!  Reduce multivitamin to one daily as B12 is high  CBC, iron and ferritin today  CCUA today and reflex c/s if abnormal  You are being referred to GI re poor appetite and weight loss  Thanks for choosing [facility], we consider it a privelige to serve you.

## 2022-11-12 LAB — CBC
Hematocrit: 35.6 % (ref 34.0–46.6)
Hemoglobin: 12.3 g/dL (ref 11.1–15.9)
MCH: 31.1 pg (ref 26.6–33.0)
MCHC: 34.6 g/dL (ref 31.5–35.7)
MCV: 90 fL (ref 79–97)
Platelets: 195 10*3/uL (ref 150–450)
RBC: 3.95 x10E6/uL (ref 3.77–5.28)
RDW: 11.1 % — ABNORMAL LOW (ref 11.7–15.4)
WBC: 3.8 10*3/uL (ref 3.4–10.8)

## 2022-11-12 LAB — IRON: Iron: 93 ug/dL (ref 27–139)

## 2022-11-12 LAB — FERRITIN: Ferritin: 45 ng/mL (ref 15–150)

## 2022-11-15 DIAGNOSIS — R634 Abnormal weight loss: Secondary | ICD-10-CM | POA: Insufficient documentation

## 2022-11-15 NOTE — Assessment & Plan Note (Signed)
Followed by Nephrology, essentially stable

## 2022-11-15 NOTE — Assessment & Plan Note (Signed)
B12 level checked and is elevated m, needs to reduce multivitamin to one daily, iron, ferritin and CBc checked on day of vis per request of daughter, one had beenm checked less than 3 months ago by Nehrology who is following Mikayla Jenkins her CKD

## 2022-11-15 NOTE — Assessment & Plan Note (Signed)
Elevated and not at oal, no med adjustment at visit currently seeing Cardiology re blood pressure management

## 2022-11-15 NOTE — Progress Notes (Signed)
   Mikayla Jenkins     MRN: [id number]      DOB: 1946-10-09   HPI Mikayla Jenkins is here  scheduled for annual exam, but visit is changed to review of her cgeneral health and addressing concerns regarding poor apetite nd weight loss, also a noted decline in function, n o longer getting out to the [facility], daughter who accompanies her feels that she had a significant decline in function following surgery thinks is may be anaesthesia related. She , the daughter had requested specific labs that she thought may point to a single underlying issue , and asks about the value of hydrating based on labs in front of me , I explained that I would not give her IV fluids as she was not dehydrated Of significance labs show marked elevation of blood sugar , now uncontrolled and needing medication as well as intentionally  eating regularly She , Mikayla Jenkins says that she is lazy and just not feel hungry,lazy she is not and GI evaluation is warranted, no significant change in BM noted, bu yes early satiety   ROS Denies recent fever or chills. Denies sinus pressure, nasal congestion, ear pain or sore throat. Denies chest congestion, productive cough or wheezing. Denies chest pains, palpitations and leg swelling  C/o mild  dysuria,  and frequency,  Denies  uncontrolled joint pain, swelling and limitation in mobility. Denies headaches, seizures, numbness, or tingling. Denies depression, anxiety or insomnia. Denies skin break down or rash.   PE  BP (!) 158/94   Pulse 82   Ht '5\' 7"'$  (1.702 m)   Wt 141 lb 0.6 oz (64 kg)   SpO2 97%   BMI 22.09 kg/m   Patient alert and oriented and in no cardiopulmonary distress.  HEENT: No facial asymmetry, EOMI,     Neck supple .  Chest: Clear to auscultation bilaterally.  CVS: S1, S2 .   Ext: No edema  Mikayla: Adequate ROM spine, shoulders, hips and knees.  Skin: Intact, no ulcerations or rash noted.  Psych: Good eye contact, . Memory intact  mildly  anxious and  depressed  appearing.  CNS: CN 2-12 intact, power,  normal throughout.no focal deficits noted.   Assessment & Plan  Unintentional weight loss Refer GI for evlauation  Type 2 diabetes mellitus with nephropathy ([place]) Uncontrolled with hyperglycemia which may be contributing to fatigue an weight loss, start glipizide 2.5 mg three times weekly Re evaluate with log in 4 to 6 weeks Commit to eating regularly  HTN (hypertension) Elevated and not at oal, no med adjustment at visit currently seeing Cardiology re blood pressure management  Stage 3b chronic kidney disease Followed by Nephrology, essentially stable  Hyperlipidemia LDL goal <100 Hyperlipidemia:Low fat diet discussed and encouraged.   Lipid Panel  Lab Results  Component Value Date   CHOL 210 (H) 11/04/2022   HDL 111 11/04/2022   LDLCALC 85 11/04/2022   TRIG 80 11/04/2022   CHOLHDL 1.9 11/04/2022     Needs to reduce fried and fatty foods  Anemia in chronic kidney disease B12 level checked and is elevated m, needs to reduce multivitamin to one daily, iron, ferritin and CBc checked on day of vis per request of daughter, one had beenm checked less than 3 months ago by Nehrology who is following Mikayla Jenkins her CKD  Dysuria CCUA in office no infection noted

## 2022-11-15 NOTE — Assessment & Plan Note (Signed)
CCUA in office no infection noted

## 2022-11-15 NOTE — Assessment & Plan Note (Signed)
Refer GI for evlauation

## 2022-11-15 NOTE — Assessment & Plan Note (Signed)
Hyperlipidemia:Low fat diet discussed and encouraged.   Lipid Panel  Lab Results  Component Value Date   CHOL 210 (H) 11/04/2022   HDL 111 11/04/2022   LDLCALC 85 11/04/2022   TRIG 80 11/04/2022   CHOLHDL 1.9 11/04/2022     Needs to reduce fried and fatty foods

## 2022-11-15 NOTE — Assessment & Plan Note (Signed)
Uncontrolled with hyperglycemia which may be contributing to fatigue an weight loss, start glipizide 2.5 mg three times weekly Re evaluate with log in 4 to 6 weeks Commit to eating regularly

## 2022-11-16 ENCOUNTER — Encounter (INDEPENDENT_AMBULATORY_CARE_PROVIDER_SITE_OTHER): Payer: Self-pay | Admitting: *Deleted

## 2022-11-24 ENCOUNTER — Ambulatory Visit: Payer: Medicare HMO

## 2022-11-24 DIAGNOSIS — G4733 Obstructive sleep apnea (adult) (pediatric): Secondary | ICD-10-CM | POA: Diagnosis not present

## 2022-11-24 DIAGNOSIS — R0683 Snoring: Secondary | ICD-10-CM

## 2022-11-25 ENCOUNTER — Telehealth: Payer: Self-pay | Admitting: Pulmonary Disease

## 2022-11-25 DIAGNOSIS — G4733 Obstructive sleep apnea (adult) (pediatric): Secondary | ICD-10-CM

## 2022-11-25 NOTE — Telephone Encounter (Signed)
HST showed mild  OSA with AHI 11 / hr and low sat of 82% Events were only noted when she was sleeping on her back  Suggest that she avoid sleeping on her back.  We can also trial mouthguard if she is interested Office visit to reassess in 6 months

## 2022-11-26 ENCOUNTER — Encounter: Payer: Self-pay | Admitting: Internal Medicine

## 2022-11-26 ENCOUNTER — Ambulatory Visit: Payer: Medicare HMO | Attending: Internal Medicine | Admitting: Internal Medicine

## 2022-11-26 VITALS — BP 168/98 | HR 67 | Ht 66.5 in | Wt 140.0 lb

## 2022-11-26 DIAGNOSIS — R011 Cardiac murmur, unspecified: Secondary | ICD-10-CM | POA: Diagnosis not present

## 2022-11-26 NOTE — Patient Instructions (Signed)
Medication Instructions:  Your physician recommends that you continue on your current medications as directed. Please refer to the Current Medication list given to you today.   Labwork: None  Testing/Procedures: Your physician has requested that you have an echocardiogram. Echocardiography is a painless test that uses sound waves to create images of your heart. It provides your doctor with information about the size and shape of your heart and how well your heart's chambers and valves are working. This procedure takes approximately one hour. There are no restrictions for this procedure. Please do NOT wear cologne, perfume, aftershave, or lotions (deodorant is allowed). Please arrive 15 minutes prior to your appointment time.   Follow-Up: Follow up with Dr. Dellia Cloud in 10 months.   Any Other Special Instructions Will Be Listed Below (If Applicable).     If you need a refill on your cardiac medications before your next appointment, please call your pharmacy.

## 2022-11-26 NOTE — Telephone Encounter (Signed)
Called and spoke to [place] ( daughter).  She voiced understanding of results but is concerned about recommendations.  She states patient has a lot of confusion and will probably not be able to keep up with a mouth guard.  [name] also thinks patient will not be able to avoid sleeping on her back. She stayed the night with patient when she did HST and states patient was on her back for most of the study but was unable to sleep on her side.   [name] wants to know if we have any other options for the patient. She states she feels her dad could help pt with CPAP compliance if that was an option. She also wants to know if they were to explore the option of the mouthguard if the patient would have to go to her dentist to have one made.   Patients daughter [name] would like a call back from me or Dr. Elsworth Soho at [phone]   Dr. Elsworth Soho please advise.

## 2022-11-26 NOTE — Progress Notes (Signed)
Cardiology Office Note  Date: 11/26/2022   ID: Mikayla, Jenkins Oct 23, 1945, MRN [id number]  PCP:  Fayrene Helper, MD  Cardiologist:  Chalmers Guest, MD Electrophysiologist:  None   Reason for Office Visit: Management of hypertension   History of Present Illness: Mikayla Jenkins is a 77 y.o. female known to have HTN, DM 2, HLD, CKD stage III is here for follow-up visit.  Patient was diagnosed with resistant hypertension and is currently on multiple antihypertensive medications.  Accompanied by daughter. Per chart review, patient's HTN is currently managed by PCP, nephrology and cardiology. Spironolactone was taken off due to worsening CKD and clonidine was added by PCP. Hydralazine was taken off by nephrology. Renal Doppler in 2018 was normal. Daughter insisting that secondary causes of hypertension need to be worked up. Aldosterone renin ratio was normal.  Patient's daughter checked her blood pressures in the morning for 3 days which are in the range of 110 to 120 mmHg SBP.  Denies any symptoms of angina, DOE, dizziness, lightness, syncope, palpitations and leg swelling.  Overall doing great.  She underwent sleep apnea testing and was found to have mild OSA.  She has CKD stage III for which she is currently following with nephrology.  Past Medical History:  Diagnosis Date   Diabetes mellitus type II    Hyperlipidemia    Hypertension    Renal disorder     Past Surgical History:  Procedure Laterality Date   ABDOMINAL HYSTERECTOMY     COLONOSCOPY N/A 06/13/2015   Procedure: COLONOSCOPY;  Surgeon: Rogene Houston, MD;  [facility];  Service: Endoscopy;  Laterality: N/A;  930-rescheduled 8/26 @ 8:30am Ann to notify pt   HERNIA REPAIR     inguinal herniorrhapy right     right eye surgery secondary to right eye weakness     SALPINGOOPHORECTOMY     TOTAL HIP ARTHROPLASTY Right 01/06/2021   TOTAL HIP ARTHROPLASTY Right 01/06/2021   Procedure: RIGHT TOTAL HIP  ARTHROPLASTY ANTERIOR APPROACH;  Surgeon: Mcarthur Rossetti, MD;  Location: [facility];  Service: Orthopedics;  Laterality: Right;   TUBAL LIGATION      Current Outpatient Medications  Medication Sig Dispense Refill   acetaminophen (TYLENOL) 650 MG CR tablet Take 650 mg by mouth every 8 (eight) hours as needed for pain.     amLODipine (NORVASC) 10 MG tablet TAKE 1 TABLET EVERY DAY 90 tablet 1   aspirin EC 81 MG tablet Take 81 mg by mouth daily.     benazepril (LOTENSIN) 40 MG tablet TAKE 1 TABLET EVERY DAY 90 tablet 1   blood glucose meter kit and supplies Dispense based on patient and insurance preference. Use up to four times daily as directed. (FOR ICD-10 E10.9, E11.9). 1 each 0   blood glucose meter kit and supplies Dispense based on patient and insurance preference  Accuchek guide meter and supplies once daily testing dx e11.9 1 each 0   Calcium Carbonate-Vitamin D 600-400 MG-UNIT tablet Take 1 tablet by mouth daily.     carvedilol (COREG) 6.25 MG tablet Take 1 tablet (6.25 mg total) by mouth 2 (two) times daily. 180 tablet 1   chlorthalidone (HYGROTON) 25 MG tablet Take 1 tablet (25 mg total) by mouth daily. 90 tablet 1   cloNIDine (CATAPRES) 0.3 MG tablet TAKE 1 AND 1/2 TABLETS AT BEDTIME 135 tablet 3   glipiZIDE (GLIPIZIDE XL) 2.5 MG 24 hr tablet Take pone tablet by mouth every Monday, Wednesday and  Friday 30 tablet 2   lovastatin (MEVACOR) 40 MG tablet TAKE 1 TABLET EVERY DAY 90 tablet 1   Multiple Vitamin (MULTIVITAMIN) tablet Take 1 tablet by mouth daily.     No current facility-administered medications for this visit.   Allergies:  Metformin and related   Social History: The patient  reports that she has never smoked. She has never used smokeless tobacco. She reports that she does not drink alcohol and does not use drugs.   Family History: The patient's family history includes COPD in her sister; Diabetes in her mother and sister; Fibroids in her daughter; Hypertension in her  mother; Pneumonia in her brother; Stroke in her brother, sister, and sister.   ROS:  Please see the history of present illness. Otherwise, complete review of systems is positive for none.  All other systems are reviewed and negative.   Physical Exam: VS:  BP (!) 168/98 Comment: Nervous- "[place]"  Pulse 67   Ht 5' 6.5" (1.689 m)   Wt 140 lb (63.5 kg)   SpO2 98%   BMI 22.26 kg/m , BMI Body mass index is 22.26 kg/m.  Wt Readings from Last 3 Encounters:  11/26/22 140 lb (63.5 kg)  11/11/22 141 lb 0.6 oz (64 kg)  10/07/22 142 lb 9.6 oz (64.7 kg)    General: Patient appears comfortable at rest. HEENT: Conjunctiva and lids normal, oropharynx clear with moist mucosa. Neck: Supple, no elevated JVP or carotid bruits, no thyromegaly. Lungs: Clear to auscultation, nonlabored breathing at rest. Cardiac: Systolic murmur Abdomen: Soft, nontender, no hepatomegaly, bowel sounds present, no guarding or rebound. Extremities: No pitting edema, distal pulses 2+. Skin: Warm and dry. Musculoskeletal: No kyphosis. Neuropsychiatric: Alert and oriented x3, affect grossly appropriate.  ECG:  An ECG dated 08/12/2022 was personally reviewed today and demonstrated:  Bradycardia  Recent Labwork: 11/04/2022: ALT 13; AST 19; BUN 19; Creatinine, Ser 1.55; Potassium 4.2; Sodium 139; TSH 1.230 11/11/2022: Hemoglobin 12.3; Platelets 195     Component Value Date/Time   CHOL 210 (H) 11/04/2022 1359   TRIG 80 11/04/2022 1359   HDL 111 11/04/2022 1359   CHOLHDL 1.9 11/04/2022 1359   CHOLHDL 2.1 08/13/2020 1316   VLDL 13 12/23/2016 1141   LDLCALC 85 11/04/2022 1359   LDLCALC 89 08/13/2020 1316    Other Studies Reviewed Today: Echo in 2018 Study Conclusions  - Left ventricle: The cavity size was normal. Wall thickness was    increased in a pattern of mild LVH. Systolic function was normal.    The estimated ejection fraction was in the range of 55% to 60%.    Doppler parameters are consistent with  abnormal left ventricular    relaxation (grade 1 diastolic dysfunction).  - Aortic valve: Valve area (VTI): 2.73 cm^2. Valve area (Vmax):    2.17 cm^2. Valve area (Vmean): 2.31 cm^2.  - Technically adequate study.   Renal artery bilateral Doppler in 2018 Renal Normal size right renal artery. Normal right RI. No evidence of right  renal artery stenosis. Normal cortical thickness of right kidney. Normal size of  left renal artery. Normal left RI. Normal cortical thickness of the left  kidney. No evidence of left renal artery stenosis.  Assessment and Plan: Patient is a 77 year old F known to have HTN, DM 2, HLD present to the cardiology clinic for follow-up visit of hypertension.  #HTN, currently not at goal (goal less than 140/90 mmHg) Plan -Home blood pressure readings in the morning ranged from 110 to 120 mmHg  for SBP -Continue amlodipine 10 mg once daily -Continue chlorthalidone 25 mg once daily -Continue carvedilol 6.25 mg twice daily -Continue benazepril 40 mg once daily -Currently on clonidine 1 and half tablet at nighttime and probably have to wean off slowly in the future. -Patient has mild OSA and will need to follow-up with pulmonology for further recommendations. Renal artery Doppler was negative in 2018.  #HLD, goal less than 100 Plan -Continue lovastatin 40 mg nightly.  # Systolic murmur -Obtain 2D echocardiogram  # CKD stage III -Follow-up with nephrology  I have spent a total of 35 minutes with patient reviewing chart, EKGs, labs and examining patient as well as establishing an assessment and plan that was discussed with the patient.  > 50% of time was spent in direct patient care.      Medication Adjustments/Labs and Tests Ordered: Current medicines are reviewed at length with the patient today.  Concerns regarding medicines are outlined above.   Tests Ordered: Orders Placed This Encounter  Procedures   ECHOCARDIOGRAM COMPLETE    Medication Changes: No  orders of the defined types were placed in this encounter.   Disposition:  Follow up 10 months  Signed, Lonzy Mato Fidel Levy, MD, 11/26/2022 2:48 PM    [facility] at [facility] [address] [place], [place] [address]

## 2022-11-26 NOTE — Telephone Encounter (Signed)
ATC Mikayla Jenkins  She asked if she could call back another time today.

## 2022-11-28 LAB — VITAMIN D 25 HYDROXY (VIT D DEFICIENCY, FRACTURES): Vit D, 25-Hydroxy: 36.4 ng/mL (ref 30.0–100.0)

## 2022-11-28 LAB — TSH: TSH: 1.23 u[IU]/mL (ref 0.450–4.500)

## 2022-11-28 LAB — SPECIMEN STATUS REPORT

## 2022-11-28 LAB — VITAMIN B12: Vitamin B-12: 1307 pg/mL — ABNORMAL HIGH (ref 232–1245)

## 2022-11-30 DIAGNOSIS — I5032 Chronic diastolic (congestive) heart failure: Secondary | ICD-10-CM | POA: Diagnosis not present

## 2022-11-30 DIAGNOSIS — H5203 Hypermetropia, bilateral: Secondary | ICD-10-CM | POA: Diagnosis not present

## 2022-11-30 DIAGNOSIS — E1122 Type 2 diabetes mellitus with diabetic chronic kidney disease: Secondary | ICD-10-CM | POA: Diagnosis not present

## 2022-11-30 DIAGNOSIS — I129 Hypertensive chronic kidney disease with stage 1 through stage 4 chronic kidney disease, or unspecified chronic kidney disease: Secondary | ICD-10-CM | POA: Diagnosis not present

## 2022-11-30 DIAGNOSIS — N189 Chronic kidney disease, unspecified: Secondary | ICD-10-CM | POA: Diagnosis not present

## 2022-11-30 LAB — HM DIABETES EYE EXAM

## 2022-11-30 NOTE — Telephone Encounter (Signed)
ATC Mikayla Jenkins.  She was at a doctors appointment with her father and she will call back.

## 2022-12-01 NOTE — Telephone Encounter (Signed)
Called and spoke to patients daughter [name] and she is agreeable to CPAP machine. [name] would like for [place] to place order for [employer] since she has already spoken to patients insurance company and this is their preferred vendor. She also asked that we make DME aware that they need to contact her for set up.  Order placed.  Sent a Therapist, music to [place] with info from Dr. Elsworth Soho as requested.  Nothing further needed at this time.

## 2022-12-06 ENCOUNTER — Other Ambulatory Visit: Payer: Self-pay

## 2022-12-07 DIAGNOSIS — N189 Chronic kidney disease, unspecified: Secondary | ICD-10-CM | POA: Diagnosis not present

## 2022-12-07 DIAGNOSIS — G4733 Obstructive sleep apnea (adult) (pediatric): Secondary | ICD-10-CM | POA: Diagnosis not present

## 2022-12-07 DIAGNOSIS — I5032 Chronic diastolic (congestive) heart failure: Secondary | ICD-10-CM | POA: Diagnosis not present

## 2022-12-07 DIAGNOSIS — I129 Hypertensive chronic kidney disease with stage 1 through stage 4 chronic kidney disease, or unspecified chronic kidney disease: Secondary | ICD-10-CM | POA: Diagnosis not present

## 2022-12-07 DIAGNOSIS — E1122 Type 2 diabetes mellitus with diabetic chronic kidney disease: Secondary | ICD-10-CM | POA: Diagnosis not present

## 2022-12-14 ENCOUNTER — Encounter (INDEPENDENT_AMBULATORY_CARE_PROVIDER_SITE_OTHER): Payer: Self-pay | Admitting: Gastroenterology

## 2022-12-14 ENCOUNTER — Encounter (INDEPENDENT_AMBULATORY_CARE_PROVIDER_SITE_OTHER): Payer: Self-pay

## 2022-12-14 ENCOUNTER — Telehealth (INDEPENDENT_AMBULATORY_CARE_PROVIDER_SITE_OTHER): Payer: Self-pay | Admitting: Gastroenterology

## 2022-12-14 ENCOUNTER — Encounter (INDEPENDENT_AMBULATORY_CARE_PROVIDER_SITE_OTHER): Payer: Self-pay | Admitting: *Deleted

## 2022-12-14 ENCOUNTER — Ambulatory Visit (INDEPENDENT_AMBULATORY_CARE_PROVIDER_SITE_OTHER): Payer: Medicare HMO | Admitting: Gastroenterology

## 2022-12-14 VITALS — BP 162/95 | HR 75 | Temp 98.8°F | Ht 66.5 in | Wt 140.0 lb

## 2022-12-14 DIAGNOSIS — R6881 Early satiety: Secondary | ICD-10-CM | POA: Diagnosis not present

## 2022-12-14 DIAGNOSIS — R634 Abnormal weight loss: Secondary | ICD-10-CM | POA: Diagnosis not present

## 2022-12-14 NOTE — Telephone Encounter (Signed)
PA approved via [employer] Authorization [id number], DOS 12/28/2022 - 02/27/2023

## 2022-12-14 NOTE — H&P (View-Only) (Signed)
Referring Provider: Fayrene Helper, MD Primary Care Physician:  Fayrene Helper, MD Primary GI Physician: new   Chief Complaint  Patient presents with   Weight Loss    Patient arrives with daughter Mikayla Jenkins. Patient referred for weight loss and early satiety.    HPI:   Mikayla Jenkins is a 77 y.o. female with past medical history of DM II, HLD, HTN, renal disorder  Patient presenting today as a new patient for weight loss and early satiety.  Last labs feb 2024, hgb 12, ferritin and iron in January 2024 45 and 93, respectively TSH in January 1.23, vit d 36.4   Present Patient arrives with Daughter Mikayla Jenkins who helps provide history. She has lost approx 12 pounds over the past year or so. Weight has been stable for the past couple of months. Notes hip surgery in 2022 and some weight loss thereafter. Patient feels that appetite is good, though she does endorse some early satiety. Daughter thinks this has been ongoing maybe since her hip surgery/anesthesia as she also had some issues with cognition after this. She denies abdominal pain, nausea, vomiting. Denies rectal bleeding or melena. She denies changes in bowels. No constipation or diarrhea, water intake is good. She denies heartburn or acid regurgitation. She was restarted back in glipizide in January due to elevated A1c and a recent HTN medication change but no other med changes. Does not take NSAIDs or drink ETOH. Patient overall feels good.    NSAID use: none Social hx: no etoh or tobacco  Fam hx:no crc or liver disease  Cologuard: 10/2019 negative  Last Colonoscopy:2016 Examination performed to cecum. Redundant colon with no evidence of colonic polyps. Small internal and external hemorrhoids.  Last Endoscopy:  Recommendations:    Past Medical History:  Diagnosis Date   Diabetes mellitus type II    Hyperlipidemia    Hypertension    Renal disorder     Past Surgical History:  Procedure Laterality Date   ABDOMINAL  HYSTERECTOMY     COLONOSCOPY N/A 06/13/2015   Procedure: COLONOSCOPY;  Surgeon: Rogene Houston, MD;  [facility];  Service: Endoscopy;  Laterality: N/A;  930-rescheduled 8/26 @ 8:30am Ann to notify pt   HERNIA REPAIR     inguinal herniorrhapy right     right eye surgery secondary to right eye weakness     SALPINGOOPHORECTOMY     TOTAL HIP ARTHROPLASTY Right 01/06/2021   TOTAL HIP ARTHROPLASTY Right 01/06/2021   Procedure: RIGHT TOTAL HIP ARTHROPLASTY ANTERIOR APPROACH;  Surgeon: Mcarthur Rossetti, MD;  Location: [facility];  Service: Orthopedics;  Laterality: Right;   TUBAL LIGATION      Current Outpatient Medications  Medication Sig Dispense Refill   acetaminophen (TYLENOL) 650 MG CR tablet Take 650 mg by mouth every 8 (eight) hours as needed for pain.     amLODipine (NORVASC) 10 MG tablet TAKE 1 TABLET EVERY DAY 90 tablet 1   aspirin EC 81 MG tablet Take 81 mg by mouth daily.     benazepril (LOTENSIN) 40 MG tablet TAKE 1 TABLET EVERY DAY 90 tablet 1   blood glucose meter kit and supplies Dispense based on patient and insurance preference. Use up to four times daily as directed. (FOR ICD-10 E10.9, E11.9). 1 each 0   blood glucose meter kit and supplies Dispense based on patient and insurance preference  Accuchek guide meter and supplies once daily testing dx e11.9 1 each 0   Calcium Carbonate-Vitamin D 600-400  MG-UNIT tablet Take 1 tablet by mouth daily.     carvedilol (COREG) 6.25 MG tablet Take 1 tablet (6.25 mg total) by mouth 2 (two) times daily. 180 tablet 1   chlorthalidone (HYGROTON) 25 MG tablet Take 1 tablet (25 mg total) by mouth daily. 90 tablet 1   cloNIDine (CATAPRES) 0.3 MG tablet TAKE 1 AND 1/2 TABLETS AT BEDTIME 135 tablet 3   glipiZIDE (GLIPIZIDE XL) 2.5 MG 24 hr tablet Take pone tablet by mouth every Tuesday, Thursday and saturdays 30 tablet 2   lovastatin (MEVACOR) 40 MG tablet TAKE 1 TABLET EVERY DAY 90 tablet 1   Multiple Vitamin (MULTIVITAMIN) tablet Take  1 tablet by mouth daily.     No current facility-administered medications for this visit.    Allergies as of 12/14/2022 - Review Complete 12/14/2022  Allergen Reaction Noted   Metformin and related Other (See Comments) 01/25/2019    Family History  Problem Relation Age of Onset   Pneumonia Brother        on continuous O2   Stroke Brother    Stroke Sister    COPD Sister    Diabetes Sister    Stroke Sister    Hypertension Mother    Diabetes Mother    Fibroids Daughter        uterine    Social History   Socioeconomic History   Marital status: Married    Spouse name: Not on file   Number of children: Not on file   Years of education: Not on file   Highest education level: High school graduate  Occupational History   Not on file  Tobacco Use   Smoking status: Never    Passive exposure: Never   Smokeless tobacco: Never  Vaping Use   Vaping Use: Never used  Substance and Sexual Activity   Alcohol use: No   Drug use: No   Sexual activity: Yes    Birth control/protection: Surgical  Other Topics Concern   Not on file  Social History Narrative   Not on file   Social Determinants of Health   Financial Resource Strain: Low Risk  (05/31/2022)   Overall Financial Resource Strain (CARDIA)    Difficulty of Paying Living Expenses: Not hard at all  Food Insecurity: No Food Insecurity (05/31/2022)   Hunger Vital Sign    Worried About Running Out of Food in the Last Year: Never true    [place] in the Last Year: Never true  Transportation Needs: No Transportation Needs (05/31/2022)   PRAPARE - Hydrologist (Medical): No    Lack of Transportation (Non-Medical): No  Physical Activity: Insufficiently Active (05/31/2022)   Exercise Vital Sign    Days of Exercise per Week: 3 days    Minutes of Exercise per Session: 30 min  Stress: No Stress Concern Present (05/31/2022)   [place]    Feeling of Stress : Not at all  Social Connections: Moderately Integrated (05/31/2022)   Social Connection and Isolation Panel [NHANES]    Frequency of Communication with Friends and Family: More than three times a week    Frequency of Social Gatherings with Friends and Family: Once a week    Attends Religious Services: More than 4 times per year    Active Member of [employer] or Organizations: No    Attends Archivist Meetings: Never    Marital Status: Married    Review of  systems General: negative for malaise, night sweats, fever, chills, +weight loss  Neck: Negative for lumps, goiter, pain and significant neck swelling Resp: Negative for cough, wheezing, dyspnea at rest CV: Negative for chest pain, leg swelling, palpitations, orthopnea GI: denies melena, hematochezia, nausea, vomiting, diarrhea, constipation, dysphagia, odynophagia +weight loss +early satiety  MSK: Negative for joint pain or swelling, back pain, and muscle pain. Derm: Negative for itching or rash Psych: Denies depression, anxiety, memory loss, confusion. No homicidal or suicidal ideation.  Heme: Negative for prolonged bleeding, bruising easily, and swollen nodes. Endocrine: Negative for cold or heat intolerance, polyuria, polydipsia and goiter. Neuro: negative for tremor, gait imbalance, syncope and seizures. The remainder of the review of systems is noncontributory.  Physical Exam: BP (!) 146/77 (BP Location: Left Arm, Patient Position: Sitting, Cuff Size: Normal)   Pulse 75   Temp 98.8 F (37.1 C) (Oral)   Ht 5' 6.5" (1.689 m)   Wt 140 lb (63.5 kg)   BMI 22.26 kg/m  General:   Alert and oriented. No distress noted. Pleasant and cooperative.  Head:  Normocephalic and atraumatic. Eyes:  Conjuctiva clear without scleral icterus. Mouth:  Oral mucosa pink and moist. Good dentition. No lesions. Heart: Normal rate and rhythm, s1 and s2 heart sounds present.  Lungs: Clear lung sounds in all  lobes. Respirations equal and unlabored. Abdomen:  +BS, soft, non-tender and non-distended. No rebound or guarding. No HSM or masses noted. Derm: No palmar erythema or jaundice Msk:  Symmetrical without gross deformities. Normal posture. Extremities:  Without edema. Neurologic:  Alert and  oriented x4 Psych:  Alert and cooperative. Normal mood and affect.  Invalid input(s): "6 MONTHS"   ASSESSMENT: BABBY BOTTENFIELD is a 77 y.o. female presenting today as a new patient for weight loss, early satiety.  Mild weight loss of approximately 12 pounds over the past year and some early satiety, though notably with hip surgery in 2022 that per patient's daughter, patient had some decline thereafter thought secondary to anesthesia. Patient denies GI symptoms other than some early satiety. She does not take NSAIDs or drink alcohol. Suspect early satiety and decline in weight is multifactorial in setting of age and recent surgery, however, cannot rule out PUD, gastritis, duodenitis, less likely malignancy, recommend proceeding with EGD for further evaluation. Indications, risks and benefits of procedure discussed in detail with patient and her daughter. Patient and daughter verbalized understanding and are in agreement to proceed with EGD.  The patient was found to have elevated blood pressure when vital signs were checked in the office. The blood pressure was rechecked by the nursing staff and it was found be persistently elevated >140/90 mmHg. Patient advised to follow up closely with PCP for hypertension control.   PLAN:  Schedule EGD ASA III ENDO 3 2. Pt to make me aware of any new GI symptoms  3. HTN management per PCP  All questions were answered, patient verbalized understanding and is in agreement with plan as outlined above.   Follow Up: 3 months   Kriss Perleberg L. Alver Sorrow, MSN, APRN, AGNP-C Adult-Gerontology Nurse Practitioner [facility]  I have reviewed the note and agree  with the APP's assessment as described in this progress note  Will start with upper endoscopic evaluation, can consider CT C/A/P with Iv contrast if normal evaluation.  Maylon Peppers, MD Gastroenterology and Hepatology [facility]

## 2022-12-14 NOTE — Patient Instructions (Signed)
We will get you scheduled for upper endoscopy for further evaluation of your weight loss and feeling full early Please let me know if you have any new GI symptoms  Follow up 3 months  It was a pleasure to see you today. I want to create trusting relationships with patients and provide genuine, compassionate, and quality care. I truly value your feedback! please be on the lookout for a survey regarding your visit with me today. I appreciate your input about our visit and your time in completing this!    Carley Glendenning L. Alver Sorrow, MSN, APRN, AGNP-C Adult-Gerontology Nurse Practitioner [facility] at [facility]

## 2022-12-14 NOTE — Progress Notes (Signed)
Referring Provider: Fayrene Helper, MD Primary Care Physician:  Fayrene Helper, MD Primary GI Physician: new   Chief Complaint  Patient presents with   Weight Loss    Patient arrives with daughter Mikayla Jenkins. Patient referred for weight loss and early satiety.    HPI:   Mikayla Jenkins is a 77 y.o. female with past medical history of DM II, HLD, HTN, renal disorder  Patient presenting today as a new patient for weight loss and early satiety.  Last labs feb 2024, hgb 12, ferritin and iron in January 2024 45 and 93, respectively TSH in January 1.23, vit d 36.4   Present Patient arrives with Daughter Mikayla Jenkins who helps provide history. She has lost approx 12 pounds over the past year or so. Weight has been stable for the past couple of months. Notes hip surgery in 2022 and some weight loss thereafter. Patient feels that appetite is good, though she does endorse some early satiety. Daughter thinks this has been ongoing maybe since her hip surgery/anesthesia as she also had some issues with cognition after this. She denies abdominal pain, nausea, vomiting. Denies rectal bleeding or melena. She denies changes in bowels. No constipation or diarrhea, water intake is good. She denies heartburn or acid regurgitation. She was restarted back in glipizide in January due to elevated A1c and a recent HTN medication change but no other med changes. Does not take NSAIDs or drink ETOH. Patient overall feels good.    NSAID use: none Social hx: no etoh or tobacco  Fam hx:no crc or liver disease  Cologuard: 10/2019 negative  Last Colonoscopy:2016 Examination performed to cecum. Redundant colon with no evidence of colonic polyps. Small internal and external hemorrhoids.  Last Endoscopy:  Recommendations:    Past Medical History:  Diagnosis Date   Diabetes mellitus type II    Hyperlipidemia    Hypertension    Renal disorder     Past Surgical History:  Procedure Laterality Date   ABDOMINAL  HYSTERECTOMY     COLONOSCOPY N/A 06/13/2015   Procedure: COLONOSCOPY;  Surgeon: Rogene Houston, MD;  [facility];  Service: Endoscopy;  Laterality: N/A;  930-rescheduled 8/26 @ 8:30am Ann to notify pt   HERNIA REPAIR     inguinal herniorrhapy right     right eye surgery secondary to right eye weakness     SALPINGOOPHORECTOMY     TOTAL HIP ARTHROPLASTY Right 01/06/2021   TOTAL HIP ARTHROPLASTY Right 01/06/2021   Procedure: RIGHT TOTAL HIP ARTHROPLASTY ANTERIOR APPROACH;  Surgeon: Mcarthur Rossetti, MD;  Location: [facility];  Service: Orthopedics;  Laterality: Right;   TUBAL LIGATION      Current Outpatient Medications  Medication Sig Dispense Refill   acetaminophen (TYLENOL) 650 MG CR tablet Take 650 mg by mouth every 8 (eight) hours as needed for pain.     amLODipine (NORVASC) 10 MG tablet TAKE 1 TABLET EVERY DAY 90 tablet 1   aspirin EC 81 MG tablet Take 81 mg by mouth daily.     benazepril (LOTENSIN) 40 MG tablet TAKE 1 TABLET EVERY DAY 90 tablet 1   blood glucose meter kit and supplies Dispense based on patient and insurance preference. Use up to four times daily as directed. (FOR ICD-10 E10.9, E11.9). 1 each 0   blood glucose meter kit and supplies Dispense based on patient and insurance preference  Accuchek guide meter and supplies once daily testing dx e11.9 1 each 0   Calcium Carbonate-Vitamin D 600-400  MG-UNIT tablet Take 1 tablet by mouth daily.     carvedilol (COREG) 6.25 MG tablet Take 1 tablet (6.25 mg total) by mouth 2 (two) times daily. 180 tablet 1   chlorthalidone (HYGROTON) 25 MG tablet Take 1 tablet (25 mg total) by mouth daily. 90 tablet 1   cloNIDine (CATAPRES) 0.3 MG tablet TAKE 1 AND 1/2 TABLETS AT BEDTIME 135 tablet 3   glipiZIDE (GLIPIZIDE XL) 2.5 MG 24 hr tablet Take pone tablet by mouth every Tuesday, Thursday and saturdays 30 tablet 2   lovastatin (MEVACOR) 40 MG tablet TAKE 1 TABLET EVERY DAY 90 tablet 1   Multiple Vitamin (MULTIVITAMIN) tablet Take  1 tablet by mouth daily.     No current facility-administered medications for this visit.    Allergies as of 12/14/2022 - Review Complete 12/14/2022  Allergen Reaction Noted   Metformin and related Other (See Comments) 01/25/2019    Family History  Problem Relation Age of Onset   Pneumonia Brother        on continuous O2   Stroke Brother    Stroke Sister    COPD Sister    Diabetes Sister    Stroke Sister    Hypertension Mother    Diabetes Mother    Fibroids Daughter        uterine    Social History   Socioeconomic History   Marital status: Married    Spouse name: Not on file   Number of children: Not on file   Years of education: Not on file   Highest education level: High school graduate  Occupational History   Not on file  Tobacco Use   Smoking status: Never    Passive exposure: Never   Smokeless tobacco: Never  Vaping Use   Vaping Use: Never used  Substance and Sexual Activity   Alcohol use: No   Drug use: No   Sexual activity: Yes    Birth control/protection: Surgical  Other Topics Concern   Not on file  Social History Narrative   Not on file   Social Determinants of Health   Financial Resource Strain: Low Risk  (05/31/2022)   Overall Financial Resource Strain (CARDIA)    Difficulty of Paying Living Expenses: Not hard at all  Food Insecurity: No Food Insecurity (05/31/2022)   Hunger Vital Sign    Worried About Running Out of Food in the Last Year: Never true    [place] in the Last Year: Never true  Transportation Needs: No Transportation Needs (05/31/2022)   PRAPARE - Hydrologist (Medical): No    Lack of Transportation (Non-Medical): No  Physical Activity: Insufficiently Active (05/31/2022)   Exercise Vital Sign    Days of Exercise per Week: 3 days    Minutes of Exercise per Session: 30 min  Stress: No Stress Concern Present (05/31/2022)   Mikayla Jenkins    Feeling of Stress : Not at all  Social Connections: Moderately Integrated (05/31/2022)   Social Connection and Isolation Panel [NHANES]    Frequency of Communication with Friends and Family: More than three times a week    Frequency of Social Gatherings with Friends and Family: Once a week    Attends Religious Services: More than 4 times per year    Active Member of [employer] or Organizations: No    Attends Archivist Meetings: Never    Marital Status: Married    Review of  systems General: negative for malaise, night sweats, fever, chills, +weight loss  Neck: Negative for lumps, goiter, pain and significant neck swelling Resp: Negative for cough, wheezing, dyspnea at rest CV: Negative for chest pain, leg swelling, palpitations, orthopnea GI: denies melena, hematochezia, nausea, vomiting, diarrhea, constipation, dysphagia, odynophagia +weight loss +early satiety  MSK: Negative for joint pain or swelling, back pain, and muscle pain. Derm: Negative for itching or rash Psych: Denies depression, anxiety, memory loss, confusion. No homicidal or suicidal ideation.  Heme: Negative for prolonged bleeding, bruising easily, and swollen nodes. Endocrine: Negative for cold or heat intolerance, polyuria, polydipsia and goiter. Neuro: negative for tremor, gait imbalance, syncope and seizures. The remainder of the review of systems is noncontributory.  Physical Exam: BP (!) 146/77 (BP Location: Left Arm, Patient Position: Sitting, Cuff Size: Normal)   Pulse 75   Temp 98.8 F (37.1 C) (Oral)   Ht 5' 6.5" (1.689 m)   Wt 140 lb (63.5 kg)   BMI 22.26 kg/m  General:   Alert and oriented. No distress noted. Pleasant and cooperative.  Head:  Normocephalic and atraumatic. Eyes:  Conjuctiva clear without scleral icterus. Mouth:  Oral mucosa pink and moist. Good dentition. No lesions. Heart: Normal rate and rhythm, s1 and s2 heart sounds present.  Lungs: Clear lung sounds in all  lobes. Respirations equal and unlabored. Abdomen:  +BS, soft, non-tender and non-distended. No rebound or guarding. No HSM or masses noted. Derm: No palmar erythema or jaundice Msk:  Symmetrical without gross deformities. Normal posture. Extremities:  Without edema. Neurologic:  Alert and  oriented x4 Psych:  Alert and cooperative. Normal mood and affect.  Invalid input(s): "6 MONTHS"   ASSESSMENT: BABBY BOTTENFIELD is a 77 y.o. female presenting today as a new patient for weight loss, early satiety.  Mild weight loss of approximately 12 pounds over the past year and some early satiety, though notably with hip surgery in 2022 that per patient's daughter, patient had some decline thereafter thought secondary to anesthesia. Patient denies GI symptoms other than some early satiety. She does not take NSAIDs or drink alcohol. Suspect early satiety and decline in weight is multifactorial in setting of age and recent surgery, however, cannot rule out PUD, gastritis, duodenitis, less likely malignancy, recommend proceeding with EGD for further evaluation. Indications, risks and benefits of procedure discussed in detail with patient and her daughter. Patient and daughter verbalized understanding and are in agreement to proceed with EGD.  The patient was found to have elevated blood pressure when vital signs were checked in the office. The blood pressure was rechecked by the nursing staff and it was found be persistently elevated >140/90 mmHg. Patient advised to follow up closely with PCP for hypertension control.   PLAN:  Schedule EGD ASA III ENDO 3 2. Pt to make me aware of any new GI symptoms  3. HTN management per PCP  All questions were answered, patient verbalized understanding and is in agreement with plan as outlined above.   Follow Up: 3 months   Meila Berke L. Alver Sorrow, MSN, APRN, AGNP-C Adult-Gerontology Nurse Practitioner [facility]  I have reviewed the note and agree  with the APP's assessment as described in this progress note  Will start with upper endoscopic evaluation, can consider CT C/A/P with Iv contrast if normal evaluation.  Maylon Peppers, MD Gastroenterology and Hepatology [facility]

## 2022-12-22 ENCOUNTER — Encounter (HOSPITAL_COMMUNITY): Payer: Self-pay

## 2022-12-22 ENCOUNTER — Encounter (HOSPITAL_COMMUNITY)
Admission: RE | Admit: 2022-12-22 | Discharge: 2022-12-22 | Disposition: A | Discharge status: Home / Self Care | Payer: Medicare HMO | Source: Ambulatory Visit | Attending: Gastroenterology | Admitting: Gastroenterology

## 2022-12-22 HISTORY — DX: Sleep apnea, unspecified: G47.30

## 2022-12-22 HISTORY — DX: Cardiac murmur, unspecified: R01.1

## 2022-12-23 ENCOUNTER — Ambulatory Visit (HOSPITAL_COMMUNITY)
Admission: RE | Admit: 2022-12-23 | Discharge: 2022-12-23 | Disposition: A | Discharge status: Home / Self Care | Payer: Medicare HMO | Source: Ambulatory Visit | Attending: Internal Medicine | Admitting: Internal Medicine

## 2022-12-23 DIAGNOSIS — R011 Cardiac murmur, unspecified: Secondary | ICD-10-CM | POA: Insufficient documentation

## 2022-12-23 LAB — ECHOCARDIOGRAM COMPLETE
AR max vel: 2.44 cm2
AV Area VTI: 2.68 cm2
AV Area mean vel: 2.36 cm2
AV Mean grad: 2.6 mmHg
AV Peak grad: 4.8 mmHg
Ao pk vel: 1.1 m/s
Area-P 1/2: 2.14 cm2
S' Lateral: 2.1 cm

## 2022-12-23 NOTE — Progress Notes (Signed)
*  PRELIMINARY RESULTS* Echocardiogram 2D Echocardiogram has been performed.  Mikayla Jenkins 12/23/2022, 3:53 PM

## 2022-12-26 ENCOUNTER — Other Ambulatory Visit: Payer: Self-pay | Admitting: Family Medicine

## 2022-12-27 ENCOUNTER — Telehealth: Payer: Self-pay

## 2022-12-27 NOTE — Telephone Encounter (Signed)
-----   Message from Chalmers Guest, MD sent at 12/27/2022  2:54 PM EDT ----- Normal pumping function of the heart and no valve abnormalities. Murmur could be innocent and not pathological.

## 2022-12-27 NOTE — Telephone Encounter (Signed)
Patients daughter notified and verbalized understanding. Patients daughter had no questions or concerns at this time.

## 2022-12-28 ENCOUNTER — Encounter (HOSPITAL_COMMUNITY)
Admission: RE | Disposition: A | Discharge status: Home / Self Care | Payer: Self-pay | Source: Home / Self Care | Attending: Gastroenterology

## 2022-12-28 ENCOUNTER — Ambulatory Visit (HOSPITAL_BASED_OUTPATIENT_CLINIC_OR_DEPARTMENT_OTHER): Payer: Medicare HMO | Admitting: Certified Registered"

## 2022-12-28 ENCOUNTER — Telehealth (INDEPENDENT_AMBULATORY_CARE_PROVIDER_SITE_OTHER): Payer: Self-pay | Admitting: Gastroenterology

## 2022-12-28 ENCOUNTER — Ambulatory Visit (HOSPITAL_COMMUNITY)
Admission: RE | Admit: 2022-12-28 | Discharge: 2022-12-28 | Disposition: A | Discharge status: Home / Self Care | Payer: Medicare HMO | Attending: Gastroenterology | Admitting: Gastroenterology

## 2022-12-28 ENCOUNTER — Encounter (HOSPITAL_COMMUNITY): Payer: Self-pay | Admitting: Gastroenterology

## 2022-12-28 ENCOUNTER — Ambulatory Visit (HOSPITAL_COMMUNITY): Payer: Medicare HMO | Admitting: Certified Registered"

## 2022-12-28 ENCOUNTER — Other Ambulatory Visit: Payer: Self-pay

## 2022-12-28 DIAGNOSIS — I10 Essential (primary) hypertension: Secondary | ICD-10-CM | POA: Insufficient documentation

## 2022-12-28 DIAGNOSIS — E785 Hyperlipidemia, unspecified: Secondary | ICD-10-CM | POA: Diagnosis not present

## 2022-12-28 DIAGNOSIS — E1149 Type 2 diabetes mellitus with other diabetic neurological complication: Secondary | ICD-10-CM

## 2022-12-28 DIAGNOSIS — G4730 Sleep apnea, unspecified: Secondary | ICD-10-CM

## 2022-12-28 DIAGNOSIS — D649 Anemia, unspecified: Secondary | ICD-10-CM | POA: Insufficient documentation

## 2022-12-28 DIAGNOSIS — R6881 Early satiety: Secondary | ICD-10-CM | POA: Diagnosis not present

## 2022-12-28 DIAGNOSIS — K449 Diaphragmatic hernia without obstruction or gangrene: Secondary | ICD-10-CM

## 2022-12-28 DIAGNOSIS — R634 Abnormal weight loss: Secondary | ICD-10-CM

## 2022-12-28 DIAGNOSIS — N289 Disorder of kidney and ureter, unspecified: Secondary | ICD-10-CM | POA: Insufficient documentation

## 2022-12-28 DIAGNOSIS — D759 Disease of blood and blood-forming organs, unspecified: Secondary | ICD-10-CM | POA: Diagnosis not present

## 2022-12-28 DIAGNOSIS — Z6822 Body mass index (BMI) 22.0-22.9, adult: Secondary | ICD-10-CM | POA: Diagnosis not present

## 2022-12-28 DIAGNOSIS — K2970 Gastritis, unspecified, without bleeding: Secondary | ICD-10-CM | POA: Diagnosis not present

## 2022-12-28 DIAGNOSIS — E119 Type 2 diabetes mellitus without complications: Secondary | ICD-10-CM | POA: Insufficient documentation

## 2022-12-28 DIAGNOSIS — Z7984 Long term (current) use of oral hypoglycemic drugs: Secondary | ICD-10-CM

## 2022-12-28 HISTORY — PX: BIOPSY: SHX5522

## 2022-12-28 HISTORY — PX: ESOPHAGOGASTRODUODENOSCOPY (EGD) WITH PROPOFOL: SHX5813

## 2022-12-28 LAB — GLUCOSE, CAPILLARY: Glucose-Capillary: 156 mg/dL — ABNORMAL HIGH (ref 70–99)

## 2022-12-28 SURGERY — ESOPHAGOGASTRODUODENOSCOPY (EGD) WITH PROPOFOL
Anesthesia: General

## 2022-12-28 MED ORDER — PROPOFOL 10 MG/ML IV BOLUS
INTRAVENOUS | Status: DC | PRN
  Administered 2022-12-28: 100 mg via INTRAVENOUS

## 2022-12-28 MED ORDER — LIDOCAINE HCL (CARDIAC) PF 100 MG/5ML IV SOSY
PREFILLED_SYRINGE | INTRAVENOUS | Status: DC | PRN
  Administered 2022-12-28: 50 mg via INTRAVENOUS

## 2022-12-28 MED ORDER — PROPOFOL 500 MG/50ML IV EMUL
INTRAVENOUS | Status: DC | PRN
  Administered 2022-12-28: 150 ug/kg/min via INTRAVENOUS

## 2022-12-28 MED ORDER — OMEPRAZOLE 40 MG PO CPDR: 40.0000 mg | DELAYED_RELEASE_CAPSULE | Freq: Every day | ORAL | 3 refills | Status: DC

## 2022-12-28 MED ORDER — LACTATED RINGERS IV SOLN: INTRAVENOUS | Status: DC | PRN

## 2022-12-28 NOTE — Anesthesia Preprocedure Evaluation (Signed)
Anesthesia Evaluation  Patient identified by MRN, date of birth, ID band Patient awake    Reviewed: Allergy & Precautions, H&P , NPO status , Patient's Chart, lab work & pertinent test results, reviewed documented beta blocker date and time   Airway Mallampati: II  TM Distance: >3 FB Neck ROM: full    Dental no notable dental hx.    Pulmonary neg pulmonary ROS, sleep apnea    Pulmonary exam normal breath sounds clear to auscultation       Cardiovascular Exercise Tolerance: Good hypertension, negative cardio ROS + Valvular Problems/Murmurs  Rhythm:regular Rate:Normal     Neuro/Psych  Neuromuscular disease negative neurological ROS  negative psych ROS   GI/Hepatic negative GI ROS, Neg liver ROS,,,  Endo/Other  negative endocrine ROSdiabetes    Renal/GU Renal diseasenegative Renal ROS  negative genitourinary   Musculoskeletal   Abdominal   Peds  Hematology negative hematology ROS (+) Blood dyscrasia, anemia   Anesthesia Other Findings  1. Left ventricular ejection fraction, by estimation, is 60 to 65%. The  left ventricle has normal function. The left ventricle has no regional  wall motion abnormalities. There is mild left ventricular hypertrophy.  Left ventricular diastolic parameters  are consistent with Grade I diastolic dysfunction (impaired relaxation).   2. Right ventricular systolic function is normal. The right ventricular  size is normal. Tricuspid regurgitation signal is inadequate for assessing  PA pressure.   3. The mitral valve is normal in structure. No evidence of mitral valve  regurgitation. No evidence of mitral stenosis.   4. The aortic valve is tricuspid. Aortic valve regurgitation is not  visualized. No aortic stenosis is present.    Reproductive/Obstetrics negative OB ROS                             Anesthesia Physical Anesthesia Plan  ASA: 3  Anesthesia Plan:  General   Post-op Pain Management:    Induction:   PONV Risk Score and Plan: Propofol infusion  Airway Management Planned:   Additional Equipment:   Intra-op Plan:   Post-operative Plan:   Informed Consent: I have reviewed the patients History and Physical, chart, labs and discussed the procedure including the risks, benefits and alternatives for the proposed anesthesia with the patient or authorized representative who has indicated his/her understanding and acceptance.     Dental Advisory Given  Plan Discussed with: CRNA  Anesthesia Plan Comments:        Anesthesia Quick Evaluation

## 2022-12-28 NOTE — Interval H&P Note (Signed)
History and Physical Interval Note:  12/28/2022 12:14 PM  Mikayla Jenkins  has presented today for surgery, with the diagnosis of EARLY SATIETY, WEIGHT LOSS.  The various methods of treatment have been discussed with the patient and family. After consideration of risks, benefits and other options for treatment, the patient has consented to  Procedure(s) with comments: ESOPHAGOGASTRODUODENOSCOPY (EGD) WITH PROPOFOL (N/A) - 115pm, asa 3 as a surgical intervention.  The patient's history has been reviewed, patient examined, no change in status, stable for surgery.  I have reviewed the patient's chart and labs.  Questions were answered to the patient's satisfaction.     [name]

## 2022-12-28 NOTE — Op Note (Signed)
[facility] Patient Name: Mikayla Jenkins Procedure Date: 12/28/2022 12:49 PM MRN: [id number] Date of Birth: 06-15-1946 Attending MD: Maylon Peppers , , [id number] CSN: [id number] Age: 77 Admit Type: Outpatient Procedure:                Upper GI endoscopy Indications:              Weight loss Providers:                Maylon Peppers, Janeece Riggers, RN, Aram Candela Referring MD:              Medicines:                Monitored Anesthesia Care Complications:            No immediate complications. Estimated Blood Loss:     Estimated blood loss: none. Procedure:                Pre-Anesthesia Assessment:                           - Prior to the procedure, a History and Physical                            was performed, and patient medications, allergies                            and sensitivities were reviewed. The patient's                            tolerance of previous anesthesia was reviewed.                           - The risks and benefits of the procedure and the                            sedation options and risks were discussed with the                            patient. All questions were answered and informed                            consent was obtained.                           - ASA Grade Assessment: II - A patient with mild                            systemic disease.                           After obtaining informed consent, the endoscope was                            passed under direct vision. Throughout the                            procedure, the patient's blood pressure,  pulse, and                            oxygen saturations were monitored continuously. The                            [id number] scope was introduced through the                            mouth, and advanced to the second part of duodenum.                            The upper GI endoscopy was accomplished without                            difficulty. The patient tolerated the  procedure                            well. Scope In: 1:05:05 PM Scope Out: 1:10:05 PM Total Procedure Duration: 0 hours 5 minutes 0 seconds  Findings:      A 2 cm hiatal hernia was present.      The gastroesophageal flap valve was visualized endoscopically and       classified as Hill Grade III (minimal fold, loose to endoscope, hiatal       hernia likely).      Patchy inflammation characterized by erosions and erythema was found in       the gastric antrum. Biopsies were taken with a cold forceps for       Helicobacter pylori testing.      The examined duodenum was normal. Impression:               - 2 cm hiatal hernia.                           - Gastritis. Biopsied.                           - Normal examined duodenum. Moderate Sedation:      Per Anesthesia Care Recommendation:           - Discharge patient to home (ambulatory).                           - Resume previous diet.                           - Await pathology results.                           - Schedule CT chest, abdomen and pelvis                           - Start omeprazole 40 mg qday. Procedure Code(s):        --- Professional ---                           [id number], Esophagogastroduodenoscopy, flexible,  transoral; with biopsy, single or multiple Diagnosis Code(s):        --- Professional ---                           K44.9, Diaphragmatic hernia without obstruction or                            gangrene                           K29.70, Gastritis, unspecified, without bleeding                           R63.4, Abnormal weight loss CPT copyright 2022 American Medical Association. All rights reserved. The codes documented in this report are preliminary and upon coder review may  be revised to meet current compliance requirements. Maylon Peppers, MD Maylon Peppers,  12/28/2022 1:17:21 PM This report has been signed electronically. Number of Addenda: 0

## 2022-12-28 NOTE — Telephone Encounter (Signed)
Mikayla Quale, MD  Vicente Males, LPN Hi [name],  Can you please schedule a CT chest abdomen and pelvis with IV contrast? Dx: unintentional weight loss  Thanks,  Maylon Peppers, MD Gastroenterology and [facility]  PA needing to be done via [facility]. Do not have access at this time. Have placed IT ticket to get access.

## 2022-12-28 NOTE — Anesthesia Procedure Notes (Signed)
Date/Time: 12/28/2022 1:04 PM  Performed by: Orlie Dakin, CRNAPre-anesthesia Checklist: Patient identified, Emergency Drugs available, Suction available and Patient being monitored Patient Re-evaluated:Patient Re-evaluated prior to induction Oxygen Delivery Method: Nasal cannula Placement Confirmation: positive ETCO2

## 2022-12-28 NOTE — Transfer of Care (Signed)
Immediate Anesthesia Transfer of Care Note  Patient: Mikayla Jenkins  Procedure(s) Performed: ESOPHAGOGASTRODUODENOSCOPY (EGD) WITH PROPOFOL BIOPSY  Patient Location: Short Stay  Anesthesia Type:General  Level of Consciousness: awake  Airway & Oxygen Therapy: Patient Spontanous Breathing  Post-op Assessment: Report given to RN and Post -op Vital signs reviewed and stable  Post vital signs: Reviewed and stable  Last Vitals:  Vitals Value Taken Time  BP 98/52 12/28/22 1313  Temp    Pulse 68 12/28/22 1313  Resp 15 12/28/22 1313  SpO2 100 % 12/28/22 1313    Last Pain:  Vitals:   12/28/22 1313  TempSrc: Oral  PainSc: 0-No pain      Patients Stated Pain Goal: 7 ([id number])  Complications: No notable events documented.

## 2022-12-28 NOTE — Discharge Instructions (Signed)
You are being discharged to home.  Resume your previous diet.  We are waiting for your pathology results.  Schedule CT chest, abdomen and pelvis Start omeprazole 40 mg qday.

## 2022-12-29 NOTE — Telephone Encounter (Signed)
[place] at [phone] and spoke with [facility]. Answered clinical questions. PA has been sent to clinical review and I will be receiving a fax to submit clinical information. Fax number to [facility] [phone]. Case number: [id number],  Mikayla Jenkins states CT Chest was denied because when PA was done on [place] it was with drawn before completed. Will await fax and send neccessary information

## 2022-12-29 NOTE — Telephone Encounter (Signed)
Questionnaire received and faxed back with clinicals to [facility] health help

## 2022-12-29 NOTE — Anesthesia Postprocedure Evaluation (Signed)
Anesthesia Post Note  Patient: Mikayla Jenkins  Procedure(s) Performed: ESOPHAGOGASTRODUODENOSCOPY (EGD) WITH PROPOFOL BIOPSY  Patient location during evaluation: Phase II Anesthesia Type: General Level of consciousness: awake Pain management: pain level controlled Vital Signs Assessment: post-procedure vital signs reviewed and stable Respiratory status: spontaneous breathing and respiratory function stable Cardiovascular status: blood pressure returned to baseline and stable Postop Assessment: no headache and no apparent nausea or vomiting Anesthetic complications: no Comments: Late entry   No notable events documented.   Last Vitals:  Vitals:   12/28/22 1148 12/28/22 1313  BP: (!) 133/52 (!) 98/52  Pulse: (!) 59 68  Resp: 16 15  Temp: 36.8 C   SpO2: 100% 100%    Last Pain:  Vitals:   12/28/22 1313  TempSrc: Oral  PainSc: 0-No pain                 Louann Sjogren

## 2022-12-30 ENCOUNTER — Telehealth (INDEPENDENT_AMBULATORY_CARE_PROVIDER_SITE_OTHER): Payer: Self-pay | Admitting: Gastroenterology

## 2022-12-30 DIAGNOSIS — E538 Deficiency of other specified B group vitamins: Secondary | ICD-10-CM | POA: Diagnosis not present

## 2022-12-30 DIAGNOSIS — E559 Vitamin D deficiency, unspecified: Secondary | ICD-10-CM | POA: Diagnosis not present

## 2022-12-30 LAB — SURGICAL PATHOLOGY

## 2022-12-30 NOTE — Telephone Encounter (Signed)
Dr.Sherman from [facility] left message to get in touch with Dr.Castaneda for a peer to peer on CT Chest. CT Chest has been denied. Questionnaire was filled out and faxed back; it has now went to [place] for clinical review. Dr.Sherman requesting peer to peer with provider. Please advise. Thank you Dr.Sherman [phone]

## 2022-12-30 NOTE — Telephone Encounter (Signed)
Spoke with daughter [name]. Daughter states that if pt can get CT ab/pelvis at a freestanding location she will pay less. Advised daughter I would find out and get it scheduled if possible.

## 2022-12-30 NOTE — Telephone Encounter (Signed)
Melissa from [facility] calling in regards to CT Chest Abd/Pelvis which is scheduled for Monday 01/03/23. Melissa number is [phone]. Needs pa for CT Chest. Will need to call Melissa back to let her know it just needs to be CT ab/pelvis.

## 2022-12-30 NOTE — Telephone Encounter (Signed)
Pt daughter contacted and made aware of appt. Pt daughter also given central schedule number in case she needs to reschedule

## 2022-12-30 NOTE — Telephone Encounter (Signed)
CT scheduled for Monday 01/03/23 at [facility]. Pt will need to arrive at 2:45 for a 5:00 appt. Pt will need to drink contrast. NPO after 1 pm on Monday. Will call daughter back around 4 to let her know(daughter states they are in an appt right now and if I could call back around 4 that would be great)

## 2022-12-30 NOTE — Telephone Encounter (Signed)
Spoke to Dr. Ebony Hail.  As patient not have risk factors for lung malignancy such as smoking or family history, CT of the chest will not be approved.  He approved a CT of the abdomen pelvis with IV contrast.  Please let the patient know about this.  She should follow with her PCP regarding her weight loss as he may need to go chest x-ray to evaluate this further.

## 2022-12-31 NOTE — Telephone Encounter (Signed)
Contacted Melissa at [facility] and updated her on what was going on with this pt order. Contacted Central Scheduling and spoke with Tonya-she fixed the order to represent CT abd/pelvis with contrast.

## 2023-01-01 LAB — VITAMIN B12: Vitamin B-12: 1385 pg/mL — ABNORMAL HIGH (ref 232–1245)

## 2023-01-01 LAB — VITAMIN D 25 HYDROXY (VIT D DEFICIENCY, FRACTURES): Vit D, 25-Hydroxy: 37.8 ng/mL (ref 30.0–100.0)

## 2023-01-01 LAB — TSH: TSH: 0.783 u[IU]/mL (ref 0.450–4.500)

## 2023-01-03 ENCOUNTER — Ambulatory Visit (HOSPITAL_COMMUNITY)
Admission: RE | Admit: 2023-01-03 | Discharge: 2023-01-03 | Disposition: A | Discharge status: Home / Self Care | Payer: Medicare HMO | Source: Ambulatory Visit | Attending: Gastroenterology | Admitting: Gastroenterology

## 2023-01-03 DIAGNOSIS — R634 Abnormal weight loss: Secondary | ICD-10-CM | POA: Insufficient documentation

## 2023-01-03 LAB — POCT I-STAT CREATININE: Creatinine, Ser: 1.5 mg/dL — ABNORMAL HIGH (ref 0.44–1.00)

## 2023-01-03 MED ORDER — IOHEXOL 300 MG/ML  SOLN
75.0000 mL | Freq: Once | INTRAMUSCULAR | Status: AC | PRN
  Administered 2023-01-03: 75 mL via INTRAVENOUS

## 2023-01-06 ENCOUNTER — Ambulatory Visit (INDEPENDENT_AMBULATORY_CARE_PROVIDER_SITE_OTHER): Payer: Medicare HMO | Admitting: Family Medicine

## 2023-01-06 ENCOUNTER — Ambulatory Visit (HOSPITAL_COMMUNITY)
Admission: RE | Admit: 2023-01-06 | Discharge: 2023-01-06 | Disposition: A | Discharge status: Home / Self Care | Payer: Medicare HMO | Source: Ambulatory Visit | Attending: Family Medicine | Admitting: Family Medicine

## 2023-01-06 ENCOUNTER — Encounter: Payer: Self-pay | Admitting: Family Medicine

## 2023-01-06 VITALS — BP 146/74 | HR 72 | Ht 66.0 in | Wt 139.0 lb

## 2023-01-06 DIAGNOSIS — E785 Hyperlipidemia, unspecified: Secondary | ICD-10-CM

## 2023-01-06 DIAGNOSIS — I10 Essential (primary) hypertension: Secondary | ICD-10-CM | POA: Insufficient documentation

## 2023-01-06 DIAGNOSIS — R6881 Early satiety: Secondary | ICD-10-CM | POA: Diagnosis not present

## 2023-01-06 DIAGNOSIS — N183 Chronic kidney disease, stage 3 unspecified: Secondary | ICD-10-CM

## 2023-01-06 DIAGNOSIS — E1121 Type 2 diabetes mellitus with diabetic nephropathy: Secondary | ICD-10-CM

## 2023-01-06 DIAGNOSIS — R0602 Shortness of breath: Secondary | ICD-10-CM | POA: Diagnosis not present

## 2023-01-06 NOTE — Assessment & Plan Note (Signed)
Improving on current medication, no hypoglycemia Updated lab needed at/ before next visit. Mikayla Jenkins is reminded of the importance of commitment to daily physical activity for 30 minutes or more, as able and the need to limit carbohydrate intake to 30 to 60 grams per meal to help with blood sugar control.   The need to take medication as prescribed, test blood sugar as directed, and to call between visits if there is a concern that blood sugar is uncontrolled is also discussed.   Mikayla Jenkins is reminded of the importance of daily foot exam, annual eye examination, and good blood sugar, blood pressure and cholesterol control.     Latest Ref Rng & Units 01/03/2023    4:51 PM 11/04/2022    1:59 PM 08/04/2022    1:41 PM 07/21/2022    2:49 PM 07/15/2022    1:34 PM  Diabetic Labs  HbA1c 4.8 - 5.6 %  7.8   6.5    Chol 100 - 199 mg/dL  210      HDL >39 mg/dL  111      Calc LDL 0 - 99 mg/dL  85      Triglycerides 0 - 149 mg/dL  80      Creatinine 0.44 - 1.00 mg/dL 1.50  1.55  1.34   1.21       01/06/2023    4:11 PM 01/06/2023    3:32 PM 01/06/2023    3:30 PM 12/28/2022    1:13 PM 12/28/2022   11:48 AM 12/14/2022    2:18 PM 12/14/2022    1:44 PM  BP/Weight  Systolic BP 123456 [id number] [id number] [id number] [id number] 0000000 123456  Diastolic BP 74 78 76 52 52 95 77  Wt. (Lbs)   139.04  138.89  140  BMI   22.44 kg/m2  22.08 kg/m2  22.26 kg/m2      Latest Ref Rng & Units 11/30/2022   12:00 AM 12/29/2020   12:00 AM  Foot/eye exam completion dates  Eye Exam No Retinopathy No Retinopathy     No Retinopathy         This result is from an external source.

## 2023-01-06 NOTE — Progress Notes (Signed)
CIA SHARF     MRN: [id number]      DOB: 1946-07-28   HPI Daugher accompanies Mikayla Landa Mikayla. Ditton is here for follow up and re-evaluation of chronic medical conditions, medication management and review of any available recent lab and radiology data.  Preventive health is updated, specifically  Cancer screening and Immunization.   Questions or concerns regarding consultations or procedures which the PT has had in the interim are  addressed.No new diagnosis to account fo hr weight loss, which is reassuring Still not committed to Boost bu will start, has gone to exercise twice since last visit Enjoys church and other social gatherings The PT denies any adverse reactions to current medications since the last visit.  There are no new concerns.  There are no specific complaints  Recors is brought of BP readings and blood suagr readings which are good  ROS Denies recent fever or chills. Denies sinus pressure, nasal congestion, ear pain or sore throat. Denies chest congestion, productive cough or wheezing. Denies chest pains, palpitations and leg swelling Denies abdominal pain, nausea, vomiting,diarrhea or constipation.   Denies dysuria, frequency, hesitancy or incontinence. Denies joint pain, swelling and limitation in mobility. Denies headaches, seizures, numbness, or tingling. Denies depression, anxiety or insomnia. Denies skin break down or rash.   PE  BP (!) 146/74   Pulse 72   Ht 5\' 6"  (1.676 m)   Wt 139 lb 0.6 oz (63.1 kg)   SpO2 98%   BMI 22.44 kg/m   Patient alert and oriented and in no cardiopulmonary distress.  HEENT: No facial asymmetry, EOMI,     Neck supple .  Chest: Clear to auscultation bilaterally.  CVS: S1, S2 no murmurs, no S3.Regular rate.  ABD: Soft non tender.   Ext: No edema  Mikayla: Adequate ROM spine, shoulders, hips and knees.  Skin: Intact, no ulcerations or rash noted.  Psych: Good eye contact, normal affect. Mildly anxious not  depressed  appearing.  CNS: CN 2-12 intact, power,  normal throughout.no focal deficits noted.   Assessment & Plan  HTN (hypertension) Adequately controlled , no change in treatment DASH diet and commitment to daily physical activity for a minimum of 30 minutes discussed and encouraged, as a part of hypertension management. The importance of attaining a healthy weight is also discussed.     01/06/2023    4:11 PM 01/06/2023    3:32 PM 01/06/2023    3:30 PM 12/28/2022    1:13 PM 12/28/2022   11:48 AM 12/14/2022    2:18 PM 12/14/2022    1:44 PM  BP/Weight  Systolic BP 123456 [id number] [id number] 98 Q000111Q 0000000 123456  Diastolic BP 74 78 76 52 52 95 77  Wt. (Lbs)   139.04  138.89  140  BMI   22.44 kg/m2  22.08 kg/m2  22.26 kg/m2       Type 2 diabetes mellitus with nephropathy ([place]) Improving on current medication, no hypoglycemia Updated lab needed at/ before next visit. Mikayla. Garron is reminded of the importance of commitment to daily physical activity for 30 minutes or more, as able and the need to limit carbohydrate intake to 30 to 60 grams per meal to help with blood sugar control.   The need to take medication as prescribed, test blood sugar as directed, and to call between visits if there is a concern that blood sugar is uncontrolled is also discussed.   Mikayla. Gong is reminded of the importance of daily foot exam, annual  eye examination, and good blood sugar, blood pressure and cholesterol control.     Latest Ref Rng & Units 01/03/2023    4:51 PM 11/04/2022    1:59 PM 08/04/2022    1:41 PM 07/21/2022    2:49 PM 07/15/2022    1:34 PM  Diabetic Labs  HbA1c 4.8 - 5.6 %  7.8   6.5    Chol 100 - 199 mg/dL  210      HDL >39 mg/dL  111      Calc LDL 0 - 99 mg/dL  85      Triglycerides 0 - 149 mg/dL  80      Creatinine 0.44 - 1.00 mg/dL 1.50  1.55  1.34   1.21       01/06/2023    4:11 PM 01/06/2023    3:32 PM 01/06/2023    3:30 PM 12/28/2022    1:13 PM 12/28/2022   11:48 AM 12/14/2022    2:18 PM 12/14/2022    1:44  PM  BP/Weight  Systolic BP 123456 [id number] 123XX123 98 Q000111Q 0000000 123456  Diastolic BP 74 78 76 52 52 95 77  Wt. (Lbs)   139.04  138.89  140  BMI   22.44 kg/m2  22.08 kg/m2  22.26 kg/m2      Latest Ref Rng & Units 11/30/2022   12:00 AM 12/29/2020   12:00 AM  Foot/eye exam completion dates  Eye Exam No Retinopathy No Retinopathy     No Retinopathy         This result is from an external source.        CKD (chronic kidney disease), stage III ([place]) Managed by Nephrology , stable  Hyperlipidemia LDL goal <100 Hyperlipidemia:Low fat diet discussed and encouraged.   Lipid Panel  Lab Results  Component Value Date   CHOL 210 (H) 11/04/2022   HDL 111 11/04/2022   LDLCALC 85 11/04/2022   TRIG 80 11/04/2022   CHOLHDL 1.9 11/04/2022     Needs to reduce fat in diet  Early satiety [facility] reports gastritis, currently on PPI which shouldimprove symptom and facilitate weight gain

## 2023-01-06 NOTE — Patient Instructions (Signed)
F/U  3rd week in May , call if you need me sooner  Nurse pl s order once daily test strips for accucheck aviva plus  Please get CXR today at the hospital  Boost 2 per day and meals  Non fasting HBA1C, chem 7 and EGFr 3 to 5 days before next appointment  Thanks for choosing [facility], we consider it a privelige to serve you.

## 2023-01-06 NOTE — Assessment & Plan Note (Signed)
Hyperlipidemia:Low fat diet discussed and encouraged.   Lipid Panel  Lab Results  Component Value Date   CHOL 210 (H) 11/04/2022   HDL 111 11/04/2022   LDLCALC 85 11/04/2022   TRIG 80 11/04/2022   CHOLHDL 1.9 11/04/2022     Needs to reduce fat in diet

## 2023-01-06 NOTE — Assessment & Plan Note (Signed)
Managed by Nephrology, stable °

## 2023-01-06 NOTE — Assessment & Plan Note (Signed)
Adequately controlled , no change in treatment DASH diet and commitment to daily physical activity for a minimum of 30 minutes discussed and encouraged, as a part of hypertension management. The importance of attaining a healthy weight is also discussed.     01/06/2023    4:11 PM 01/06/2023    3:32 PM 01/06/2023    3:30 PM 12/28/2022    1:13 PM 12/28/2022   11:48 AM 12/14/2022    2:18 PM 12/14/2022    1:44 PM  BP/Weight  Systolic BP 123456 A999333 123XX123 98 Q000111Q 0000000 123456  Diastolic BP 74 78 76 52 52 95 77  Wt. (Lbs)   139.04  138.89  140  BMI   22.44 kg/m2  22.08 kg/m2  22.26 kg/m2

## 2023-01-06 NOTE — Assessment & Plan Note (Addendum)
[facility] end[facility] reports gastritis, currently on PPI which shouldimprove symptom and facilitate weight gain

## 2023-01-07 ENCOUNTER — Encounter (HOSPITAL_COMMUNITY): Payer: Self-pay | Admitting: Gastroenterology

## 2023-01-13 ENCOUNTER — Other Ambulatory Visit: Payer: Self-pay | Admitting: Internal Medicine

## 2023-02-07 DIAGNOSIS — N189 Chronic kidney disease, unspecified: Secondary | ICD-10-CM | POA: Diagnosis not present

## 2023-02-07 DIAGNOSIS — I129 Hypertensive chronic kidney disease with stage 1 through stage 4 chronic kidney disease, or unspecified chronic kidney disease: Secondary | ICD-10-CM | POA: Diagnosis not present

## 2023-02-07 DIAGNOSIS — G4733 Obstructive sleep apnea (adult) (pediatric): Secondary | ICD-10-CM | POA: Diagnosis not present

## 2023-02-07 DIAGNOSIS — I5032 Chronic diastolic (congestive) heart failure: Secondary | ICD-10-CM | POA: Diagnosis not present

## 2023-02-07 DIAGNOSIS — E1122 Type 2 diabetes mellitus with diabetic chronic kidney disease: Secondary | ICD-10-CM | POA: Diagnosis not present

## 2023-02-09 ENCOUNTER — Other Ambulatory Visit: Payer: Self-pay | Admitting: Internal Medicine

## 2023-02-16 ENCOUNTER — Other Ambulatory Visit: Payer: Self-pay | Admitting: Family Medicine

## 2023-03-03 ENCOUNTER — Ambulatory Visit (INDEPENDENT_AMBULATORY_CARE_PROVIDER_SITE_OTHER): Payer: Medicare HMO | Admitting: Family Medicine

## 2023-03-03 ENCOUNTER — Encounter: Payer: Self-pay | Admitting: Family Medicine

## 2023-03-03 VITALS — BP 156/84 | HR 74 | Ht 66.0 in | Wt 135.1 lb

## 2023-03-03 DIAGNOSIS — E785 Hyperlipidemia, unspecified: Secondary | ICD-10-CM | POA: Diagnosis not present

## 2023-03-03 DIAGNOSIS — I10 Essential (primary) hypertension: Secondary | ICD-10-CM | POA: Diagnosis not present

## 2023-03-03 DIAGNOSIS — E1121 Type 2 diabetes mellitus with diabetic nephropathy: Secondary | ICD-10-CM

## 2023-03-03 DIAGNOSIS — Z7984 Long term (current) use of oral hypoglycemic drugs: Secondary | ICD-10-CM

## 2023-03-03 NOTE — Assessment & Plan Note (Signed)
Mikayla Jenkins is reminded of the importance of commitment to daily physical activity for 30 minutes or more, as able and the need to limit carbohydrate intake to 30 to 60 grams per meal to help with blood sugar control.   The need to take medication as prescribed, test blood sugar as directed, and to call between visits if there is a concern that blood sugar is uncontrolled is also discussed.   Mikayla Jenkins is reminded of the importance of daily foot exam, annual eye examination, and good blood sugar, blood pressure and cholesterol control.     Latest Ref Rng & Units 01/03/2023    4:51 PM 11/04/2022    1:59 PM 08/04/2022    1:41 PM 07/21/2022    2:49 PM 07/15/2022    1:34 PM  Diabetic Labs  HbA1c 4.8 - 5.6 %  7.8   6.5    Chol 100 - 199 mg/dL  161      HDL >09 mg/dL  604      Calc LDL 0 - 99 mg/dL  85      Triglycerides 0 - 149 mg/dL  80      Creatinine 5.40 - 1.00 mg/dL 9.81  1.91  4.78   2.95       03/03/2023    4:10 PM 03/03/2023    4:09 PM 03/03/2023    3:48 PM 03/03/2023    3:47 PM 01/06/2023    4:11 PM 01/06/2023    3:32 PM 01/06/2023    3:30 PM  BP/Weight  Systolic BP 156 160 168 193 146 142 169  Diastolic BP 84 84 79 91 74 78 76  Wt. (Lbs)    135.12   139.04  BMI    21.81 kg/m2   22.44 kg/m2      Latest Ref Rng & Units 11/30/2022   12:00 AM 12/29/2020   12:00 AM  Foot/eye exam completion dates  Eye Exam No Retinopathy No Retinopathy     No Retinopathy         This result is from an external source.      Updated lab needed at/ before next visit.

## 2023-03-03 NOTE — Assessment & Plan Note (Signed)
Hyperlipidemia:Low fat diet discussed and encouraged.   Lipid Panel  Lab Results  Component Value Date   CHOL 210 (H) 11/04/2022   HDL 111 11/04/2022   LDLCALC 85 11/04/2022   TRIG 80 11/04/2022   CHOLHDL 1.9 11/04/2022     Updated lab needed at/ before next visit.

## 2023-03-03 NOTE — Assessment & Plan Note (Signed)
Improved, no med change at this time , has upcoming  nephrology appt will work with nephrology DASH diet and commitment to daily physical activity for a minimum of 30 minutes discussed and encouraged, as a part of hypertension management. The importance of attaining a healthy weight is also discussed.     03/03/2023    4:10 PM 03/03/2023    4:09 PM 03/03/2023    3:48 PM 03/03/2023    3:47 PM 01/06/2023    4:11 PM 01/06/2023    3:32 PM 01/06/2023    3:30 PM  BP/Weight  Systolic BP 156 160 168 193 146 142 169  Diastolic BP 84 84 79 91 74 78 76  Wt. (Lbs)    135.12   139.04  BMI    21.81 kg/m2   22.44 kg/m2

## 2023-03-03 NOTE — Patient Instructions (Addendum)
F/U in 3 months, call if y ou need me sooner  Labs today lipid, cmp and eGFr, HBA1C,  Thankful your appetite and energy are improving also your blood pressure  Thanks for choosing [facility], we consider it a privelige to serve you.

## 2023-03-03 NOTE — Progress Notes (Signed)
Mikayla Jenkins     MRN: [id number]      DOB: 03-21-1946  Chief Complaint  Patient presents with   Follow-up    Follow up no concerns    HPI Mikayla Jenkins is here for follow up and re-evaluation of chronic medical conditions, medication management and review of any available recent lab and radiology data.  Preventive health is updated, specifically  Cancer screening and Immunization.   Questions or concerns regarding consultations or procedures which the PT has had in the interim are  addressed. Daughter Reports improved eating and she has been going out with her husband for cancer treatment Mikayla Jenkins has no stated concerns or complaints  ROS Denies recent fever or chills. Denies sinus pressure, nasal congestion, ear pain or sore throat. Denies chest congestion, productive cough or wheezing. Denies chest pains, palpitations and leg swelling Denies abdominal pain, nausea, vomiting,diarrhea or constipation.   Denies dysuria, frequency, hesitancy or incontinence. Denies joint pain, swelling and limitation in mobility. Denies headaches, seizures, numbness, or tingling. Denies depression, anxiety or insomnia. Denies skin break down or rash.   PE  BP (!) 156/84   Pulse 74   Ht 5\' 6"  (1.676 m)   Wt 135 lb 1.9 oz (61.3 kg)   SpO2 95%   BMI 21.81 kg/m   Patient alert and oriented and in no cardiopulmonary distress.  HEENT: No facial asymmetry, EOMI,     Neck supple .  Chest: Clear to auscultation bilaterally.  CVS: S1, S2 no murmurs, no S3.Regular rate.  ABD: Soft non tender.   Ext: No edema  Mikayla: Adequate though reduced  ROM spine, shoulders, hips and knees.  Skin: Intact, no ulcerations or rash noted.  Psych: Good eye contact, normal affect. Memory intact not anxious or depressed appearing.  CNS: CN 2-12 intact, power,  normal throughout.no focal deficits noted.   Assessment & Plan  HTN (hypertension) Improved, no med change at this time , has upcoming  nephrology  appt will work with nephrology DASH diet and commitment to daily physical activity for a minimum of 30 minutes discussed and encouraged, as a part of hypertension management. The importance of attaining a healthy weight is also discussed.     03/03/2023    4:10 PM 03/03/2023    4:09 PM 03/03/2023    3:48 PM 03/03/2023    3:47 PM 01/06/2023    4:11 PM 01/06/2023    3:32 PM 01/06/2023    3:30 PM  BP/Weight  Systolic BP 156 160 168 193 146 142 169  Diastolic BP 84 84 79 91 74 78 76  Wt. (Lbs)    135.12   139.04  BMI    21.81 kg/m2   22.44 kg/m2       Hyperlipidemia LDL goal <100 Hyperlipidemia:Low fat diet discussed and encouraged.   Lipid Panel  Lab Results  Component Value Date   CHOL 210 (H) 11/04/2022   HDL 111 11/04/2022   LDLCALC 85 11/04/2022   TRIG 80 11/04/2022   CHOLHDL 1.9 11/04/2022     Updated lab needed at/ before next visit.   Type 2 diabetes mellitus with nephropathy [facility]) Mikayla Jenkins is reminded of the importance of commitment to daily physical activity for 30 minutes or more, as able and the need to limit carbohydrate intake to 30 to 60 grams per meal to help with blood sugar control.   The need to take medication as prescribed, test blood sugar as directed, and to call between visits  if there is a concern that blood sugar is uncontrolled is also discussed.   Mikayla Jenkins is reminded of the importance of daily foot exam, annual eye examination, and good blood sugar, blood pressure and cholesterol control.     Latest Ref Rng & Units 01/03/2023    4:51 PM 11/04/2022    1:59 PM 08/04/2022    1:41 PM 07/21/2022    2:49 PM 07/15/2022    1:34 PM  Diabetic Labs  HbA1c 4.8 - 5.6 %  7.8   6.5    Chol 100 - 199 mg/dL  147      HDL >82 mg/dL  956      Calc LDL 0 - 99 mg/dL  85      Triglycerides 0 - 149 mg/dL  80      Creatinine 2.13 - 1.00 mg/dL 0.86  5.78  4.69   6.29       03/03/2023    4:10 PM 03/03/2023    4:09 PM 03/03/2023    3:48 PM 03/03/2023    3:47 PM  01/06/2023    4:11 PM 01/06/2023    3:32 PM 01/06/2023    3:30 PM  BP/Weight  Systolic BP 156 160 168 193 146 142 169  Diastolic BP 84 84 79 91 74 78 76  Wt. (Lbs)    135.12   139.04  BMI    21.81 kg/m2   22.44 kg/m2      Latest Ref Rng & Units 11/30/2022   12:00 AM 12/29/2020   12:00 AM  Foot/eye exam completion dates  Eye Exam No Retinopathy No Retinopathy     No Retinopathy         This result is from an external source.      Updated lab needed at/ before next visit.

## 2023-03-04 ENCOUNTER — Other Ambulatory Visit: Payer: Self-pay

## 2023-03-04 DIAGNOSIS — E1121 Type 2 diabetes mellitus with diabetic nephropathy: Secondary | ICD-10-CM

## 2023-03-04 LAB — CMP14+EGFR
ALT: 12 IU/L (ref 0–32)
AST: 19 IU/L (ref 0–40)
Albumin/Globulin Ratio: 2.2 (ref 1.2–2.2)
Albumin: 4.8 g/dL (ref 3.8–4.8)
Alkaline Phosphatase: 105 IU/L (ref 44–121)
BUN/Creatinine Ratio: 18 (ref 12–28)
BUN: 20 mg/dL (ref 8–27)
Bilirubin Total: 0.8 mg/dL (ref 0.0–1.2)
CO2: 24 mmol/L (ref 20–29)
Calcium: 10.2 mg/dL (ref 8.7–10.3)
Chloride: 102 mmol/L (ref 96–106)
Creatinine, Ser: 1.13 mg/dL — ABNORMAL HIGH (ref 0.57–1.00)
Globulin, Total: 2.2 g/dL (ref 1.5–4.5)
Glucose: 102 mg/dL — ABNORMAL HIGH (ref 70–99)
Potassium: 3.9 mmol/L (ref 3.5–5.2)
Sodium: 140 mmol/L (ref 134–144)
Total Protein: 7 g/dL (ref 6.0–8.5)
eGFR: 50 mL/min/{1.73_m2} — ABNORMAL LOW (ref >59)

## 2023-03-04 LAB — LIPID PANEL
Chol/HDL Ratio: 2 ratio (ref 0.0–4.4)
Cholesterol, Total: 216 mg/dL — ABNORMAL HIGH (ref 100–199)
HDL: 109 mg/dL (ref >39)
LDL Chol Calc (NIH): 94 mg/dL (ref 0–99)
Triglycerides: 73 mg/dL (ref 0–149)
VLDL Cholesterol Cal: 13 mg/dL (ref 5–40)

## 2023-03-04 LAB — HEMOGLOBIN A1C
Est. average glucose Bld gHb Est-mCnc: 160 mg/dL
Hgb A1c MFr Bld: 7.2 % — ABNORMAL HIGH (ref 4.8–5.6)

## 2023-03-14 ENCOUNTER — Other Ambulatory Visit: Payer: Self-pay | Admitting: Family Medicine

## 2023-03-15 ENCOUNTER — Ambulatory Visit: Payer: Medicare HMO | Admitting: Pulmonary Disease

## 2023-03-15 ENCOUNTER — Encounter: Payer: Self-pay | Admitting: Pulmonary Disease

## 2023-03-15 VITALS — BP 149/70 | HR 69 | Ht 65.0 in | Wt 136.8 lb

## 2023-03-15 DIAGNOSIS — G4733 Obstructive sleep apnea (adult) (pediatric): Secondary | ICD-10-CM

## 2023-03-15 DIAGNOSIS — I1A0 Resistant hypertension: Secondary | ICD-10-CM

## 2023-03-15 NOTE — Patient Instructions (Signed)
CPAP is working well 

## 2023-03-15 NOTE — Assessment & Plan Note (Signed)
We reviewed CPAP download which shows excellent control of events, average pressure is 12 cm with maximum pressure of 12.6 cm.  She is very compliant, on average about 6 hours per night.  CPAP is certainly helping her with increased energy levels.  Blood pressure still remains slightly borderline high.  I am not sure how much more beneficial effects that we will be but I think reasonable to continue CPAP therapy for 6 to 12 months and then reassess  compliance with goal of at least 4-6 hrs every night is the expectation. Advised against medications with sedative side effects Cautioned against driving when sleepy - understanding that sleepiness will vary on a day to day basis

## 2023-03-15 NOTE — Progress Notes (Signed)
   Subjective:    Patient ID: Mikayla Jenkins, female    DOB: 1946-02-15, 77 y.o.   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 Daughter felt that she would not be able to use a mouthguard and would not be able to be compliant with positional therapy hence we proceeded with CPAP therapy.  She received a CPAP and has been using this for about 2 to 3 months now.  She had some initial adjustment issues but seems to have settled down.  She reports being slightly better rested in the mornings and has some more energy in the daytime.  She was not taking naps previously. She is still needing 5 medications to control her blood pressure She has settled down with nasal pillows, could not tolerate fullface mask  Significant tests/ events reviewed 11/2022 HST showed mild  OSA with AHI 11 / hr and low sat of 82%    Review of Systems neg for any significant sore throat, dysphagia, itching, sneezing, nasal congestion or excess/ purulent secretions, fever, chills, sweats, unintended wt loss, pleuritic or exertional cp, hempoptysis, orthopnea pnd or change in chronic leg swelling. Also denies presyncope, palpitations, heartburn, abdominal pain, nausea, vomiting, diarrhea or change in bowel or urinary habits, dysuria,hematuria, rash, arthralgias, visual complaints, headache, numbness weakness or ataxia.     Objective:   Physical Exam  Gen. Pleasant, well-nourished, in no distress ENT - no thrush, no pallor/icterus,no post nasal drip Neck: No JVD, no thyromegaly, no carotid bruits Lungs: no use of accessory muscles, no  dullness to percussion, clear without rales or rhonchi  Cardiovascular: Rhythm regular, heart sounds  normal, no murmurs or gallops, no peripheral edema Musculoskeletal: No deformities, no cyanosis or clubbing        Assessment & Plan:

## 2023-03-15 NOTE — Assessment & Plan Note (Signed)
Blood pressure slight high today, remains on 5 medications. Hopeful that CPAP therapy will help with refractory hypertension

## 2023-03-16 ENCOUNTER — Emergency Department (HOSPITAL_COMMUNITY): Payer: Medicare HMO

## 2023-03-16 ENCOUNTER — Encounter (HOSPITAL_COMMUNITY)
Admission: EM | Disposition: A | Discharge status: Home Health | Payer: Self-pay | Source: Home / Self Care | Attending: Cardiology

## 2023-03-16 ENCOUNTER — Inpatient Hospital Stay (HOSPITAL_COMMUNITY)
Admission: EM | Admit: 2023-03-16 | Discharge: 2023-03-22 | DRG: 281 | Disposition: A | Discharge status: Home Health | Payer: Medicare HMO | Attending: Cardiology | Admitting: Cardiology

## 2023-03-16 ENCOUNTER — Other Ambulatory Visit: Payer: Self-pay

## 2023-03-16 ENCOUNTER — Encounter (HOSPITAL_COMMUNITY): Payer: Self-pay | Admitting: Emergency Medicine

## 2023-03-16 DIAGNOSIS — I5042 Chronic combined systolic (congestive) and diastolic (congestive) heart failure: Secondary | ICD-10-CM | POA: Diagnosis present

## 2023-03-16 DIAGNOSIS — Z7901 Long term (current) use of anticoagulants: Secondary | ICD-10-CM | POA: Diagnosis not present

## 2023-03-16 DIAGNOSIS — I10 Essential (primary) hypertension: Secondary | ICD-10-CM | POA: Diagnosis present

## 2023-03-16 DIAGNOSIS — N183 Chronic kidney disease, stage 3 unspecified: Secondary | ICD-10-CM | POA: Diagnosis not present

## 2023-03-16 DIAGNOSIS — I5181 Takotsubo syndrome: Secondary | ICD-10-CM

## 2023-03-16 DIAGNOSIS — I428 Other cardiomyopathies: Secondary | ICD-10-CM | POA: Diagnosis present

## 2023-03-16 DIAGNOSIS — E1121 Type 2 diabetes mellitus with diabetic nephropathy: Secondary | ICD-10-CM | POA: Diagnosis not present

## 2023-03-16 DIAGNOSIS — Z833 Family history of diabetes mellitus: Secondary | ICD-10-CM | POA: Diagnosis not present

## 2023-03-16 DIAGNOSIS — Z96641 Presence of right artificial hip joint: Secondary | ICD-10-CM | POA: Diagnosis present

## 2023-03-16 DIAGNOSIS — Z7984 Long term (current) use of oral hypoglycemic drugs: Secondary | ICD-10-CM

## 2023-03-16 DIAGNOSIS — Z825 Family history of asthma and other chronic lower respiratory diseases: Secondary | ICD-10-CM | POA: Diagnosis not present

## 2023-03-16 DIAGNOSIS — I513 Intracardiac thrombosis, not elsewhere classified: Secondary | ICD-10-CM | POA: Diagnosis not present

## 2023-03-16 DIAGNOSIS — M549 Dorsalgia, unspecified: Secondary | ICD-10-CM | POA: Diagnosis not present

## 2023-03-16 DIAGNOSIS — I252 Old myocardial infarction: Secondary | ICD-10-CM | POA: Diagnosis not present

## 2023-03-16 DIAGNOSIS — N179 Acute kidney failure, unspecified: Secondary | ICD-10-CM | POA: Diagnosis not present

## 2023-03-16 DIAGNOSIS — I5021 Acute systolic (congestive) heart failure: Secondary | ICD-10-CM | POA: Diagnosis not present

## 2023-03-16 DIAGNOSIS — I213 ST elevation (STEMI) myocardial infarction of unspecified site: Secondary | ICD-10-CM | POA: Diagnosis not present

## 2023-03-16 DIAGNOSIS — E1169 Type 2 diabetes mellitus with other specified complication: Secondary | ICD-10-CM | POA: Diagnosis present

## 2023-03-16 DIAGNOSIS — I249 Acute ischemic heart disease, unspecified: Secondary | ICD-10-CM | POA: Diagnosis not present

## 2023-03-16 DIAGNOSIS — I2510 Atherosclerotic heart disease of native coronary artery without angina pectoris: Secondary | ICD-10-CM | POA: Diagnosis not present

## 2023-03-16 DIAGNOSIS — I1A0 Resistant hypertension: Secondary | ICD-10-CM | POA: Diagnosis not present

## 2023-03-16 DIAGNOSIS — I5032 Chronic diastolic (congestive) heart failure: Secondary | ICD-10-CM | POA: Diagnosis not present

## 2023-03-16 DIAGNOSIS — E1122 Type 2 diabetes mellitus with diabetic chronic kidney disease: Secondary | ICD-10-CM | POA: Diagnosis not present

## 2023-03-16 DIAGNOSIS — R410 Disorientation, unspecified: Secondary | ICD-10-CM | POA: Diagnosis present

## 2023-03-16 DIAGNOSIS — I959 Hypotension, unspecified: Secondary | ICD-10-CM | POA: Diagnosis not present

## 2023-03-16 DIAGNOSIS — Z79899 Other long term (current) drug therapy: Secondary | ICD-10-CM | POA: Diagnosis not present

## 2023-03-16 DIAGNOSIS — Z888 Allergy status to other drugs, medicaments and biological substances status: Secondary | ICD-10-CM

## 2023-03-16 DIAGNOSIS — I499 Cardiac arrhythmia, unspecified: Secondary | ICD-10-CM | POA: Diagnosis not present

## 2023-03-16 DIAGNOSIS — I5041 Acute combined systolic (congestive) and diastolic (congestive) heart failure: Secondary | ICD-10-CM | POA: Diagnosis not present

## 2023-03-16 DIAGNOSIS — I130 Hypertensive heart and chronic kidney disease with heart failure and stage 1 through stage 4 chronic kidney disease, or unspecified chronic kidney disease: Secondary | ICD-10-CM | POA: Diagnosis not present

## 2023-03-16 DIAGNOSIS — R079 Chest pain, unspecified: Secondary | ICD-10-CM | POA: Diagnosis not present

## 2023-03-16 DIAGNOSIS — N2581 Secondary hyperparathyroidism of renal origin: Secondary | ICD-10-CM | POA: Diagnosis not present

## 2023-03-16 DIAGNOSIS — I214 Non-ST elevation (NSTEMI) myocardial infarction: Secondary | ICD-10-CM | POA: Diagnosis not present

## 2023-03-16 DIAGNOSIS — R9431 Abnormal electrocardiogram [ECG] [EKG]: Secondary | ICD-10-CM | POA: Diagnosis not present

## 2023-03-16 DIAGNOSIS — E785 Hyperlipidemia, unspecified: Secondary | ICD-10-CM | POA: Diagnosis present

## 2023-03-16 DIAGNOSIS — Z7982 Long term (current) use of aspirin: Secondary | ICD-10-CM | POA: Diagnosis not present

## 2023-03-16 DIAGNOSIS — G4730 Sleep apnea, unspecified: Secondary | ICD-10-CM | POA: Diagnosis present

## 2023-03-16 DIAGNOSIS — Z7401 Bed confinement status: Secondary | ICD-10-CM | POA: Diagnosis not present

## 2023-03-16 DIAGNOSIS — I129 Hypertensive chronic kidney disease with stage 1 through stage 4 chronic kidney disease, or unspecified chronic kidney disease: Secondary | ICD-10-CM | POA: Diagnosis not present

## 2023-03-16 DIAGNOSIS — M546 Pain in thoracic spine: Secondary | ICD-10-CM | POA: Diagnosis not present

## 2023-03-16 DIAGNOSIS — Z823 Family history of stroke: Secondary | ICD-10-CM

## 2023-03-16 DIAGNOSIS — N1831 Chronic kidney disease, stage 3a: Secondary | ICD-10-CM | POA: Diagnosis not present

## 2023-03-16 DIAGNOSIS — G4733 Obstructive sleep apnea (adult) (pediatric): Secondary | ICD-10-CM | POA: Diagnosis not present

## 2023-03-16 HISTORY — PX: CORONARY/GRAFT ACUTE MI REVASCULARIZATION: CATH118305

## 2023-03-16 HISTORY — PX: LEFT HEART CATH AND CORONARY ANGIOGRAPHY: CATH118249

## 2023-03-16 LAB — LACTIC ACID, PLASMA: Lactic Acid, Venous: 1.2 mmol/L (ref 0.5–1.9)

## 2023-03-16 LAB — COMPREHENSIVE METABOLIC PANEL
ALT: 18 U/L (ref 0–44)
AST: 29 U/L (ref 15–41)
Albumin: 4.5 g/dL (ref 3.5–5.0)
Alkaline Phosphatase: 85 U/L (ref 38–126)
Anion gap: 10 (ref 5–15)
BUN: 15 mg/dL (ref 8–23)
CO2: 24 mmol/L (ref 22–32)
Calcium: 9.6 mg/dL (ref 8.9–10.3)
Chloride: 100 mmol/L (ref 98–111)
Creatinine, Ser: 1.26 mg/dL — ABNORMAL HIGH (ref 0.44–1.00)
GFR, Estimated: 44 mL/min — ABNORMAL LOW (ref >60)
Glucose, Bld: 153 mg/dL — ABNORMAL HIGH (ref 70–99)
Potassium: 3.6 mmol/L (ref 3.5–5.1)
Sodium: 134 mmol/L — ABNORMAL LOW (ref 135–145)
Total Bilirubin: 1 mg/dL (ref 0.3–1.2)
Total Protein: 7.7 g/dL (ref 6.5–8.1)

## 2023-03-16 LAB — APTT: aPTT: 29 seconds (ref 24–36)

## 2023-03-16 LAB — LIPID PANEL
Cholesterol: 207 mg/dL — ABNORMAL HIGH (ref 0–200)
HDL: 107 mg/dL (ref >40)
LDL Cholesterol: 85 mg/dL (ref 0–99)
Total CHOL/HDL Ratio: 1.9 RATIO
Triglycerides: 75 mg/dL (ref <150)
VLDL: 15 mg/dL (ref 0–40)

## 2023-03-16 LAB — CBC
HCT: 38.5 % (ref 36.0–46.0)
Hemoglobin: 12.8 g/dL (ref 12.0–15.0)
MCH: 31.3 pg (ref 26.0–34.0)
MCHC: 33.2 g/dL (ref 30.0–36.0)
MCV: 94.1 fL (ref 80.0–100.0)
Platelets: 218 10*3/uL (ref 150–400)
RBC: 4.09 MIL/uL (ref 3.87–5.11)
RDW: 12.7 % (ref 11.5–15.5)
WBC: 6.6 10*3/uL (ref 4.0–10.5)
nRBC: 0 % (ref 0.0–0.2)

## 2023-03-16 LAB — PROTIME-INR
INR: 1.1 (ref 0.8–1.2)
Prothrombin Time: 14.5 seconds (ref 11.4–15.2)

## 2023-03-16 LAB — TROPONIN I (HIGH SENSITIVITY)
Troponin I (High Sensitivity): 2557 ng/L (ref <18)
Troponin I (High Sensitivity): 3099 ng/L (ref <18)

## 2023-03-16 SURGERY — CORONARY/GRAFT ACUTE MI REVASCULARIZATION
Anesthesia: LOCAL

## 2023-03-16 MED ORDER — ASPIRIN 81 MG PO CHEW: 324.0000 mg | CHEWABLE_TABLET | Freq: Once | ORAL | Status: DC

## 2023-03-16 MED ORDER — FENTANYL CITRATE (PF) 100 MCG/2ML IJ SOLN
INTRAMUSCULAR | Status: DC | PRN
  Administered 2023-03-16: 25 ug via INTRAVENOUS

## 2023-03-16 MED ORDER — VERAPAMIL HCL 2.5 MG/ML IV SOLN
INTRAVENOUS | Status: AC
  Filled 2023-03-16: qty 2

## 2023-03-16 MED ORDER — SODIUM CHLORIDE 0.9 % IV SOLN: INTRAVENOUS | Status: DC

## 2023-03-16 MED ORDER — HEPARIN SODIUM (PORCINE) 1000 UNIT/ML IJ SOLN
INTRAMUSCULAR | Status: DC | PRN
  Administered 2023-03-16: 3000 [IU] via INTRAVENOUS

## 2023-03-16 MED ORDER — MIDAZOLAM HCL 2 MG/2ML IJ SOLN
INTRAMUSCULAR | Status: AC
  Filled 2023-03-16: qty 2

## 2023-03-16 MED ORDER — HEPARIN SODIUM (PORCINE) 5000 UNIT/ML IJ SOLN
60.0000 [IU]/kg | Freq: Once | INTRAMUSCULAR | Status: AC
  Administered 2023-03-16: 3750 [IU] via INTRAVENOUS
  Filled 2023-03-16: qty 1

## 2023-03-16 MED ORDER — IOHEXOL 350 MG/ML SOLN
INTRAVENOUS | Status: DC | PRN
  Administered 2023-03-16: 60 mL

## 2023-03-16 MED ORDER — ASPIRIN 81 MG PO CHEW
324.0000 mg | CHEWABLE_TABLET | Freq: Once | ORAL | Status: AC
  Administered 2023-03-16: 324 mg via ORAL
  Filled 2023-03-16: qty 4

## 2023-03-16 MED ORDER — LIDOCAINE HCL (PF) 1 % IJ SOLN
INTRAMUSCULAR | Status: AC
  Filled 2023-03-16: qty 30

## 2023-03-16 MED ORDER — HEPARIN SODIUM (PORCINE) 1000 UNIT/ML IJ SOLN
INTRAMUSCULAR | Status: AC
  Filled 2023-03-16: qty 10

## 2023-03-16 MED ORDER — HEPARIN (PORCINE) IN NACL 1000-0.9 UT/500ML-% IV SOLN
INTRAVENOUS | Status: DC | PRN
  Administered 2023-03-16 (×2): 500 mL

## 2023-03-16 MED ORDER — LIDOCAINE HCL (PF) 1 % IJ SOLN
INTRAMUSCULAR | Status: DC | PRN
  Administered 2023-03-16: 5 mL

## 2023-03-16 MED ORDER — NITROGLYCERIN 2 % TD OINT: 1.0000 [in_us] | TOPICAL_OINTMENT | Freq: Four times a day (QID) | TRANSDERMAL | Status: DC

## 2023-03-16 MED ORDER — MIDAZOLAM HCL 2 MG/2ML IJ SOLN
INTRAMUSCULAR | Status: DC | PRN
  Administered 2023-03-16: 1 mg via INTRAVENOUS

## 2023-03-16 MED ORDER — VERAPAMIL HCL 2.5 MG/ML IV SOLN
INTRAVENOUS | Status: DC | PRN
  Administered 2023-03-16: 10 mL via INTRA_ARTERIAL

## 2023-03-16 MED ORDER — NITROGLYCERIN 0.4 MG SL SUBL
0.4000 mg | SUBLINGUAL_TABLET | SUBLINGUAL | Status: DC | PRN
  Administered 2023-03-16: 0.4 mg via SUBLINGUAL
  Filled 2023-03-16: qty 1

## 2023-03-16 MED ORDER — HEPARIN SODIUM (PORCINE) 5000 UNIT/ML IJ SOLN: 60.0000 [IU]/kg | Freq: Once | INTRAMUSCULAR | Status: DC

## 2023-03-16 MED ORDER — NITROGLYCERIN 1 MG/10 ML FOR IR/CATH LAB
INTRA_ARTERIAL | Status: AC
  Filled 2023-03-16: qty 10

## 2023-03-16 MED ORDER — FENTANYL CITRATE (PF) 100 MCG/2ML IJ SOLN
INTRAMUSCULAR | Status: AC
  Filled 2023-03-16: qty 2

## 2023-03-16 MED ORDER — SODIUM CHLORIDE 0.9 % IV SOLN
INTRAVENOUS | Status: AC | PRN
  Administered 2023-03-16: 10 mL/h via INTRAVENOUS

## 2023-03-16 SURGICAL SUPPLY — 14 items
BAND CMPR LRG ZPHR (HEMOSTASIS) ×1
BAND ZEPHYR COMPRESS 30 LONG (HEMOSTASIS) IMPLANT
CATH OPTITORQUE TIG 4.0 5F (CATHETERS) IMPLANT
CATH VISTA GUIDE 6FR JR4 (CATHETERS) IMPLANT
GLIDESHEATH SLEND SS 6F .021 (SHEATH) IMPLANT
GUIDEWIRE INQWIRE 1.5J.035X260 (WIRE) IMPLANT
INQWIRE 1.5J .035X260CM (WIRE) ×1
KIT ENCORE 26 ADVANTAGE (KITS) IMPLANT
KIT HEART LEFT (KITS) ×1 IMPLANT
PACK CARDIAC CATHETERIZATION (CUSTOM PROCEDURE TRAY) ×1 IMPLANT
SHEATH PROBE COVER 6X72 (BAG) IMPLANT
SYR MEDRAD MARK 7 150ML (SYRINGE) ×1 IMPLANT
TRANSDUCER W/STOPCOCK (MISCELLANEOUS) ×1 IMPLANT
TUBING CIL FLEX 10 FLL-RA (TUBING) ×1 IMPLANT

## 2023-03-16 NOTE — H&P (Signed)
    BRIEF NOTED  77 year old with hypertension hyperlipidemia diabetes mellitus of type II who was in her usual health until the evening of 03/07/2023 when she presented any pain emergency room with pain between the shoulder blades and left lateral chest wall pain.  She also noted some nausea.  No prior cardiac history.  Was in the waiting area and had some mild abnormalities on EKG but poor quality.  Initial 20 came back at 2557 and therefore she was brought back for more detailed evaluation at this time EKG was better quality and showed subtle ST elevation in inferior leads with anterolateral T wave inversions.  Findings be consistent with possible high lateral high anterolateral ST elevation MI.  Did not fully meet criteria however with ongoing chest pain and concerning EKG in the patient with high risk factors, she was brought in to [facility] emergency traffic as possible STEMI.  Upon arrival she had received 2 subcu nitroglycerin as well as 4000 IV heparin and noted her pain was down from 8-10 down to maybe 6-8/10 pain mostly still in the back. Her EKG however had improved dramatically with much less prominent subtle changes in the limb leads but still with the T wave inversions in V5 V6.  Based on fact she still had symptoms and the dynamic EKG changes I opted to proceed to the cardiac estimated station lab with more of a diagnosis of urgent non-STEMI as opposed to STEMI.   Full H&P to follow.   Bryan Lemma, MD

## 2023-03-16 NOTE — ED Notes (Signed)
STEMI MD PAGED TO DR Deretha Emory

## 2023-03-16 NOTE — ED Notes (Signed)
[facility] present to transport pt

## 2023-03-16 NOTE — ED Notes (Signed)
ED Provider at bedside. 

## 2023-03-16 NOTE — H&P (Incomplete)
Cardiology Admission History and Physical   Patient ID: Mikayla Jenkins MRN: [id number]; DOB: 05-09-1946   Admission date: 03/16/2023  PCP:  Kerri Perches, MD   [facility] Providers Cardiologist:  Marjo Bicker, MD   { Click here to update MD or APP on Care Team, Refresh:1}    Chief Complaint:  chest pain  Patient Profile:   Mikayla Jenkins is a 77 y.o. female with HTN, T2DM, HLD, CKD who is being seen 03/16/2023 for the evaluation of STEMI.  History of Present Illness:   Mikayla Jenkins is a 77 yo female with multiple CV risk factors who presented to [facility] at 2100 after 30 minutes of acute chest pain with radiation to back.  Associated symptoms of nausea.  She has no prior cardiac history.  Chest pain at the worst was 8 out of 10.  On arrival to the ED was afebrile hemodynamically stable.  Her initial EKG was of poor quality and initial labs were drawn.  And troponin returned back at 2500 and repeat EKG was done demonstrating minor ST elevations in lead I and aVL, with anterior lateral T wave versions that were new.  Code STEMI was activated.  She was then given aspirin, heparin, nitro sublingual x 2 with improvement in her chest pain 6 out of 10.  Repeat EKG showed improved lateral EKG findings but persistent anterior lateral T wave versions.  Her chest pain was persistent at 8 out of 10 on arrival to [facility].  Thus decision was made to take her urgently to the Cath Lab for unstable NSTEMI.  Creatinine 1.26.  She denies any history of stroke.  Chest x-ray unremarkable on review.  Past Medical History:  Diagnosis Date   Diabetes mellitus type II    Heart murmur    Hyperlipidemia    Hypertension    Renal disorder    Sleep apnea    Past Surgical History:  Procedure Laterality Date   ABDOMINAL HYSTERECTOMY     BIOPSY  12/28/2022   Procedure: BIOPSY;  Surgeon: Dolores Frame, MD;  Location: [facility];  Service: Gastroenterology;;   COLONOSCOPY N/A  06/13/2015   Procedure: COLONOSCOPY;  Surgeon: Malissa Hippo, MD;  Location: [facility];  Service: Endoscopy;  Laterality: N/A;  930-rescheduled 8/26 @ 8:30am Ann to notify pt   ESOPHAGOGASTRODUODENOSCOPY (EGD) WITH PROPOFOL N/A 12/28/2022   Procedure: ESOPHAGOGASTRODUODENOSCOPY (EGD) WITH PROPOFOL;  Surgeon: Dolores Frame, MD;  Location: [facility];  Service: Gastroenterology;  Laterality: N/A;  115pm, asa 3   HERNIA REPAIR     inguinal herniorrhapy right     right eye surgery secondary to right eye weakness     SALPINGOOPHORECTOMY     TOTAL HIP ARTHROPLASTY Right 01/06/2021   TOTAL HIP ARTHROPLASTY Right 01/06/2021   Procedure: RIGHT TOTAL HIP ARTHROPLASTY ANTERIOR APPROACH;  Surgeon: Kathryne Hitch, MD;  Location: [facility] OR;  Service: Orthopedics;  Laterality: Right;   TUBAL LIGATION      Medications Prior to Admission: Prior to Admission medications   Medication Sig Start Date End Date Taking? Authorizing Provider  acetaminophen (TYLENOL) 650 MG CR tablet Take 650 mg by mouth every 8 (eight) hours as needed for pain.    [provider]  amLODipine (NORVASC) 10 MG tablet TAKE 1 TABLET EVERY DAY 02/16/23   Kerri Perches, MD  aspirin EC 81 MG tablet Take 81 mg by mouth in the morning.    [provider]  benazepril (LOTENSIN) 40 MG tablet TAKE 1 TABLET EVERY DAY 12/27/22   Kerri Perches, MD  blood glucose meter kit and supplies Dispense based on patient and insurance preference. Use up to four times daily as directed. (FOR ICD-10 E10.9, E11.9). 05/03/18   Kerri Perches, MD  blood glucose meter kit and supplies Dispense based on patient and insurance preference  Accuchek guide meter and supplies once daily testing dx e11.9 12/17/20   Kerri Perches, MD  Calcium Carbonate-Vitamin D 600-400 MG-UNIT tablet Take 1 tablet by mouth in the morning.    [provider]  carvedilol (COREG) 6.25 MG tablet TAKE 1 TABLET TWICE DAILY (STOP  ATENOLOL/CHLORTHALIDONE) 02/09/23   Mallipeddi, Vishnu P, MD  chlorthalidone (HYGROTON) 25 MG tablet TAKE 1 TABLET (25 MG TOTAL) BY MOUTH DAILY (STOP ATENOLOL / CHLORTHALIDONE) 01/13/23   Mallipeddi, Vishnu P, MD  cloNIDine (CATAPRES) 0.3 MG tablet TAKE 1 AND 1/2 TABLETS AT BEDTIME 09/22/22   Kerri Perches, MD  glipiZIDE (GLUCOTROL XL) 2.5 MG 24 hr tablet TAKE 1 TABLET WITH BREAKFAST EVERY TUESDAY, THURSDAY AND SATURDAY 03/15/23   Kerri Perches, MD  lovastatin (MEVACOR) 40 MG tablet TAKE 1 TABLET EVERY DAY 12/27/22   Kerri Perches, MD  Multiple Vitamin (MULTIVITAMIN) tablet Take 1 tablet by mouth in the morning.    [provider]  omeprazole (PRILOSEC) 40 MG capsule Take 1 capsule (40 mg total) by mouth daily. 12/28/22   Dolores Frame, MD     Allergies:    Allergies  Allergen Reactions   Metformin And Related Other (See Comments)    CKD stage 4    Social History:   Social History   Socioeconomic History   Marital status: Married    Spouse name: Not on file   Number of children: Not on file   Years of education: Not on file   Highest education level: High school graduate  Occupational History   Not on file  Tobacco Use   Smoking status: Never    Passive exposure: Never   Smokeless tobacco: Never  Vaping Use   Vaping Use: Never used  Substance and Sexual Activity   Alcohol use: No   Drug use: No   Sexual activity: Yes    Birth control/protection: Surgical  Other Topics Concern   Not on file  Social History Narrative   Not on file   Social Determinants of Health   Financial Resource Strain: Low Risk  (05/31/2022)   Overall Financial Resource Strain (CARDIA)    Difficulty of Paying Living Expenses: Not hard at all  Food Insecurity: No Food Insecurity (05/31/2022)   Hunger Vital Sign    Worried About Running Out of Food in the Last Year: Never true    Ran Out of Food in the Last Year: Never true  Transportation Needs: No Transportation  Needs (05/31/2022)   PRAPARE - Administrator, Civil Service (Medical): No    Lack of Transportation (Non-Medical): No  Physical Activity: Insufficiently Active (05/31/2022)   Exercise Vital Sign    Days of Exercise per Week: 3 days    Minutes of Exercise per Session: 30 min  Stress: No Stress Concern Present (05/31/2022)   [employer] of Occupational Health - Occupational Stress Questionnaire    Feeling of Stress : Not at all  Social Connections: Moderately Integrated (05/31/2022)   Social Connection and Isolation Panel [NHANES]    Frequency of Communication with Friends and Family: More than three  times a week    Frequency of Social Gatherings with Friends and Family: Once a week    Attends Religious Services: More than 4 times per year    Active Member of [employer] or Organizations: No    Attends Banker Meetings: Never    Marital Status: Married  Catering manager Violence: Not At Risk (05/31/2022)   Humiliation, Afraid, Rape, and Kick questionnaire    Fear of Current or Ex-Partner: No    Emotionally Abused: No    Physically Abused: No    Sexually Abused: No    Family History:   The patient's family history includes COPD in her sister; Diabetes in her mother and sister; Fibroids in her daughter; Hypertension in her mother; Pneumonia in her brother; Stroke in her brother, sister, and sister.    ROS:  Please see the history of present illness.  All other ROS reviewed and negative.     Physical Exam/Data:   Vitals:   03/16/23 2200 03/16/23 2230 03/16/23 2242 03/16/23 2339  BP: (!) 145/94 (!) 154/103 (!) 148/73   Pulse: 69 83 (!) 104   Resp: 18 15    Temp:      SpO2: 100% 97% 100% 100%  Weight:      Height:       No intake or output data in the 24 hours ending 03/16/23 2348    03/16/2023    8:17 PM 03/15/2023    2:05 PM 03/03/2023    3:47 PM  Last 3 Weights  Weight (lbs) 137 lb 136 lb 12.8 oz 135 lb 1.9 oz  Weight (kg) 62.143 kg 62.052 kg 61.29 kg      Body mass index is 22.8 kg/m.  General:  Well nourished, well developed, in no acute distress HEENT: normal Neck: no JVD Vascular: No carotid bruits; Distal pulses 2+ bilaterally   Cardiac:  normal S1, S2; RRR; no murmur  Lungs:  clear to auscultation bilaterally, no wheezing, rhonchi or rales  Abd: soft, nontender, no hepatomegaly  Ext: no edema Musculoskeletal:  No deformities, BUE and BLE strength normal and equal Skin: warm and dry  Neuro:  CNs 2-12 intact, no focal abnormalities noted Psych:  Normal affect   EKG:  The ECG that was done  was personally reviewed and demonstrates: Sinus rhythm, extreme axis, ST elevation in 1, aVL, V2, T wave inversions in V4 through V6  Relevant CV Studies:  TTE 12/23/2022  IMPRESSIONS     1. Left ventricular ejection fraction, by estimation, is 60 to 65%. The  left ventricle has normal function. The left ventricle has no regional  wall motion abnormalities. There is mild left ventricular hypertrophy.  Left ventricular diastolic parameters  are consistent with Grade I diastolic dysfunction (impaired relaxation).   2. Right ventricular systolic function is normal. The right ventricular  size is normal. Tricuspid regurgitation signal is inadequate for assessing  PA pressure.   3. The mitral valve is normal in structure. No evidence of mitral valve  regurgitation. No evidence of mitral stenosis.   4. The aortic valve is tricuspid. Aortic valve regurgitation is not  visualized. No aortic stenosis is present.   Laboratory Data:  High Sensitivity Troponin:   Recent Labs  Lab 03/16/23 2032 03/16/23 2223  TROPONINIHS 2,557* 3,099*      Chemistry Recent Labs  Lab 03/16/23 2032  NA 134*  K 3.6  CL 100  CO2 24  GLUCOSE 153*  BUN 15  CREATININE 1.26*  CALCIUM 9.6  GFRNONAA 44*  ANIONGAP 10    Recent Labs  Lab 03/16/23 2032  PROT 7.7  ALBUMIN 4.5  AST 29  ALT 18  ALKPHOS 85  BILITOT 1.0   Lipids  Recent Labs  Lab  03/16/23 2032  CHOL 207*  TRIG 75  HDL 107  LDLCALC 85  CHOLHDL 1.9   Hematology Recent Labs  Lab 03/16/23 2032  WBC 6.6  RBC 4.09  HGB 12.8  HCT 38.5  MCV 94.1  MCH 31.3  MCHC 33.2  RDW 12.7  PLT 218   Thyroid No results for input(s): "TSH", "FREET4" in the last 168 hours. BNPNo results for input(s): "BNP", "PROBNP" in the last 168 hours.  DDimer No results for input(s): "DDIMER" in the last 168 hours.   Radiology/Studies:  DG Thoracic Spine 2 View  Result Date: 03/16/2023 CLINICAL DATA:  Chest pain upper back pain EXAM: THORACIC SPINE 2 VIEWS COMPARISON:  None Available. FINDINGS: Scoliosis of the thoracolumbar spine. Vertebral body heights are grossly maintained. Multilevel degenerative osteophytes. IMPRESSION: Scoliosis and degenerative changes. Electronically Signed   By: Jasmine Pang M.D.   On: 03/16/2023 21:23   DG Chest 2 View  Result Date: 03/16/2023 CLINICAL DATA:  Chest pain EXAM: CHEST - 2 VIEW COMPARISON:  01/06/2023 FINDINGS: The heart size and mediastinal contours are within normal limits. Both lungs are clear. Scoliosis. Aortic atherosclerosis. Minimal scarring left lung base. IMPRESSION: No active cardiopulmonary disease. Electronically Signed   By: Jasmine Pang M.D.   On: 03/16/2023 21:22     Assessment and Plan:   High Risk NSTEMI HTN, HLD Patient presented with dynamic high risk EKG features with typical angina that did not meet STEMI criteria however with ongoing active chest pain despite medication administration decision was made to urgently take Cath Lab.  Cath Lab demonstrated left dominant system with no large vessel occlusion and LAD left circumflex system.  RCA without large vessel occlusion.  Unclear etiology for her chest pain and troponin elevation.  Possible she had coronary vasospasm vs. peri-myocarditis vs. MINOCA.  -Continue home aspirin, benazepril, carvedilol, chlorthalidone, clonidine, lovastatin 40 mg  3. T2DM Hold home glipizide.   Continue SSI with Accu-Cheks.  Risk Assessment/Risk Scores:  {Complete the following score calculators/questions to meet required metrics.  Press F2:1}  TIMI Risk Score for Unstable Angina or Non-ST Elevation MI:   The patient's TIMI risk score is 6, which indicates a 41% risk of all cause mortality, new or recurrent myocardial infarction or need for urgent revascularization in the next 14 days.{ Click here to calculate score  REFRESH Note before signing  :1}    Severity of Illness: The appropriate patient status for this patient is INPATIENT. Inpatient status is judged to be reasonable and necessary in order to provide the required intensity of service to ensure the patient's safety. The patient's presenting symptoms, physical exam findings, and initial radiographic and laboratory data in the context of their chronic comorbidities is felt to place them at high risk for further clinical deterioration. Furthermore, it is not anticipated that the patient will be medically stable for discharge from the hospital within 2 midnights of admission.   * I certify that at the point of admission it is my clinical judgment that the patient will require inpatient hospital care spanning beyond 2 midnights from the point of admission due to high intensity of service, high risk for further deterioration and high frequency of surveillance required.*   For questions or updates, please contact [facility]  Please consult [email or web address] for contact info under     Signed, Thana Ates, MD  03/16/2023 11:48 PM

## 2023-03-16 NOTE — ED Triage Notes (Signed)
Pt c/o upper back pain between shoulder blades since about 3:30pm this afternoon. She went to a dr appointment today and then suddenly started hurting afterwards. No identifiable improvement or worsening based on position, inhalation, etc. Pt also has nausea. Recent dx gastritis. Hx HTN with numerous BP medications. A/O

## 2023-03-16 NOTE — ED Provider Notes (Addendum)
[place] EMERGENCY DEPARTMENT AT [facility] Provider Note   CSN: [id number] Arrival date & time: 03/16/23  1845     History  Chief Complaint  Patient presents with   Back Pain    Mikayla Jenkins is a 77 y.o. female.  Patient has a history of some memory issues.  Patient with complaint at about 15-30 of the left lateral chest wall pain and then it did go between the shoulder blades.  Patient's daughter is here with her and she is a Teacher, early years/pre.  They also noted some nausea but no vomiting.  Patient does not have a history of known heart attack but does have a history of hyperlipidemia hypertension diabetes and some renal insufficiency.  EKG done out in the waiting area was not conclusive for STEMI patient's troponin came back at 2557.  Repeat EKG back here is read as an acute lateral infarct acute MI there is ST segment elevation in V2 there is depression in V5 and questionable elevation in aVL and depression in aVF and in an 3.  So could be a STEMI certainly an excellent history.  Will discuss with the on-call Cath Lab cardiologist have them review the EKG.  Will initiate STEMI order set for now.       Home Medications Prior to Admission medications   Medication Sig Start Date End Date Taking? Authorizing Provider  acetaminophen (TYLENOL) 650 MG CR tablet Take 650 mg by mouth every 8 (eight) hours as needed for pain.    [provider]  amLODipine (NORVASC) 10 MG tablet TAKE 1 TABLET EVERY DAY 02/16/23   Kerri Perches, MD  aspirin EC 81 MG tablet Take 81 mg by mouth in the morning.    [provider]  benazepril (LOTENSIN) 40 MG tablet TAKE 1 TABLET EVERY DAY 12/27/22   Kerri Perches, MD  blood glucose meter kit and supplies Dispense based on patient and insurance preference. Use up to four times daily as directed. (FOR ICD-10 E10.9, E11.9). 05/03/18   Kerri Perches, MD  blood glucose meter kit and supplies Dispense based on patient and  insurance preference  Accuchek guide meter and supplies once daily testing dx e11.9 12/17/20   Kerri Perches, MD  Calcium Carbonate-Vitamin D 600-400 MG-UNIT tablet Take 1 tablet by mouth in the morning.    [provider]  carvedilol (COREG) 6.25 MG tablet TAKE 1 TABLET TWICE DAILY (STOP ATENOLOL/CHLORTHALIDONE) 02/09/23   Mallipeddi, Vishnu P, MD  chlorthalidone (HYGROTON) 25 MG tablet TAKE 1 TABLET (25 MG TOTAL) BY MOUTH DAILY (STOP ATENOLOL / CHLORTHALIDONE) 01/13/23   Mallipeddi, Vishnu P, MD  cloNIDine (CATAPRES) 0.3 MG tablet TAKE 1 AND 1/2 TABLETS AT BEDTIME 09/22/22   Kerri Perches, MD  glipiZIDE (GLUCOTROL XL) 2.5 MG 24 hr tablet TAKE 1 TABLET WITH BREAKFAST EVERY TUESDAY, THURSDAY AND SATURDAY 03/15/23   Kerri Perches, MD  lovastatin (MEVACOR) 40 MG tablet TAKE 1 TABLET EVERY DAY 12/27/22   Kerri Perches, MD  Multiple Vitamin (MULTIVITAMIN) tablet Take 1 tablet by mouth in the morning.    [provider]  omeprazole (PRILOSEC) 40 MG capsule Take 1 capsule (40 mg total) by mouth daily. 12/28/22   Dolores Frame, MD      Allergies    Metformin and related    Review of Systems   Review of Systems  Constitutional:  Negative for chills and fever.  HENT:  Negative for ear pain and sore throat.  Eyes:  Negative for pain and visual disturbance.  Respiratory:  Negative for cough and shortness of breath.   Cardiovascular:  Positive for chest pain. Negative for palpitations.  Gastrointestinal:  Positive for nausea. Negative for abdominal pain and vomiting.  Genitourinary:  Negative for dysuria and hematuria.  Musculoskeletal:  Positive for back pain. Negative for arthralgias.  Skin:  Negative for color change and rash.  Neurological:  Negative for seizures and syncope.  All other systems reviewed and are negative.   Physical Exam Updated Vital Signs BP (!) 167/111 (BP Location: Right Arm)   Pulse 80   Temp 98.3 F (36.8 C)   Resp 18    Ht 1.651 m (5\' 5" )   Wt 62.1 kg   SpO2 100%   BMI 22.80 kg/m  Physical Exam Vitals and nursing note reviewed.  Constitutional:      General: She is not in acute distress.    Appearance: Normal appearance. She is well-developed.  HENT:     Head: Normocephalic and atraumatic.  Eyes:     Extraocular Movements: Extraocular movements intact.     Conjunctiva/sclera: Conjunctivae normal.     Pupils: Pupils are equal, round, and reactive to light.  Cardiovascular:     Rate and Rhythm: Normal rate and regular rhythm.     Heart sounds: No murmur heard. Pulmonary:     Effort: Pulmonary effort is normal. No respiratory distress.     Breath sounds: Normal breath sounds. No wheezing or rales.  Abdominal:     Palpations: Abdomen is soft.     Tenderness: There is no abdominal tenderness.  Musculoskeletal:        General: No swelling.     Cervical back: Neck supple.  Skin:    General: Skin is warm and dry.     Capillary Refill: Capillary refill takes less than 2 seconds.  Neurological:     General: No focal deficit present.     Mental Status: She is alert. Mental status is at baseline.  Psychiatric:        Mood and Affect: Mood normal.     ED Results / Procedures / Treatments   Labs (all labs ordered are listed, but only abnormal results are displayed) Labs Reviewed  COMPREHENSIVE METABOLIC PANEL - Abnormal; Notable for the following components:      Result Value   Sodium 134 (*)    Glucose, Bld 153 (*)    Creatinine, Ser 1.26 (*)    GFR, Estimated 44 (*)    All other components within normal limits  LIPID PANEL - Abnormal; Notable for the following components:   Cholesterol 207 (*)    All other components within normal limits  TROPONIN I (HIGH SENSITIVITY) - Abnormal; Notable for the following components:   Troponin I (High Sensitivity) 2,557 (*)    All other components within normal limits  CBC  PROTIME-INR  APTT  HEMOGLOBIN A1C  LACTIC ACID, PLASMA  TROPONIN I (HIGH  SENSITIVITY)    EKG EKG Interpretation  Date/Time:  Wednesday Mar 16 2023 20:24:39 EDT Ventricular Rate:  80 PR Interval:  170 QRS Duration: 96 QT Interval:  376 QTC Calculation: 433 R Axis:   171 Text Interpretation: Normal sinus rhythm Right axis deviation Anterior infarct , age undetermined T wave abnormality, consider lateral ischemia Abnormal ECG When compared with ECG of 16-Nov-2019 19:33, PREVIOUS ECG IS PRESENT Confirmed by Vanetta Mulders [phone]) on 03/16/2023 9:21:21 PM  Radiology DG Thoracic Spine 2 View  Result Date: 03/16/2023  CLINICAL DATA:  Chest pain upper back pain EXAM: THORACIC SPINE 2 VIEWS COMPARISON:  None Available. FINDINGS: Scoliosis of the thoracolumbar spine. Vertebral body heights are grossly maintained. Multilevel degenerative osteophytes. IMPRESSION: Scoliosis and degenerative changes. Electronically Signed   By: Jasmine Pang M.D.   On: 03/16/2023 21:23   DG Chest 2 View  Result Date: 03/16/2023 CLINICAL DATA:  Chest pain EXAM: CHEST - 2 VIEW COMPARISON:  01/06/2023 FINDINGS: The heart size and mediastinal contours are within normal limits. Both lungs are clear. Scoliosis. Aortic atherosclerosis. Minimal scarring left lung base. IMPRESSION: No active cardiopulmonary disease. Electronically Signed   By: Jasmine Pang M.D.   On: 03/16/2023 21:22    Procedures Procedures    Medications Ordered in ED Medications  0.9 %  sodium chloride infusion ( Intravenous New Bag/Given 03/16/23 2227)  0.9 %  sodium chloride infusion ( Intravenous Not Given 03/16/23 2229)  aspirin chewable tablet 324 mg (324 mg Oral Not Given 03/16/23 2230)  heparin injection 3,750 Units (3,750 Units Intravenous Not Given 03/16/23 2230)  nitroGLYCERIN (NITROSTAT) SL tablet 0.4 mg (0.4 mg Sublingual Given 03/16/23 2233)  nitroGLYCERIN (NITROGLYN) 2 % ointment 1 inch (has no administration in time range)  aspirin chewable tablet 324 mg (324 mg Oral Given 03/16/23 2220)  heparin injection  3,750 Units (3,750 Units Intravenous Given 03/16/23 2226)    ED Course/ Medical Decision Making/ A&P                             Medical Decision Making Amount and/or Complexity of Data Reviewed Labs: ordered. Radiology: ordered.  Risk OTC drugs. Prescription drug management. Decision regarding hospitalization.   CRITICAL CARE Performed by: Vanetta Mulders Total critical care time: 40 minutes Critical care time was exclusive of separately billable procedures and treating other patients. Critical care was necessary to treat or prevent imminent or life-threatening deterioration. Critical care was time spent personally by me on the following activities: development of treatment plan with patient and/or surrogate as well as nursing, discussions with consultants, evaluation of patient's response to treatment, examination of patient, obtaining history from patient or surrogate, ordering and performing treatments and interventions, ordering and review of laboratory studies, ordering and review of radiographic studies, pulse oximetry and re-evaluation of patient's condition.  Patient either with non-STEMI or STEMI.  Repeat EKG little concerning for possible STEMI but may want to go to Cath Lab either way.  Will discuss with the Cath Lab cardiologist on-call at [facility].  Will initiate heparin and nitro and aspirin.  Patient does take a baby aspirin a day is not on any blood thinners.  Chest x-ray here without any acute findings.  Patient's cleat metabolic panel sodium 134 glucose 153 creatinine 1.26 GFR 44 LFTs normal as mentioned troponin was 2557 CBC without any acute abnormalities.  Cardiology is going to go with quality as a code STEMI will get EMS to transport her show get IV heparin bolus and sublingual nitroglycerin.  They are ready to transport her were not enough time to get the heparin drip done.  She will go to the bridge of the [facility] ED and the cardiac cath team will reassess her to see if  she is improved.  Discussed with Dr. Herbie Baltimore from cardiology.  Patient still having the back pain following the aspirin.  Final Clinical Impression(s) / ED Diagnoses Final diagnoses:  ST elevation myocardial infarction (STEMI), unspecified artery (HCC)    Rx / DC Orders  ED Discharge Orders     None         Vanetta Mulders, MD 03/16/23 1610    Vanetta Mulders, MD 03/16/23 9604    Vanetta Mulders, MD 03/16/23 2234

## 2023-03-17 ENCOUNTER — Inpatient Hospital Stay (HOSPITAL_COMMUNITY): Payer: Medicare HMO

## 2023-03-17 ENCOUNTER — Encounter (HOSPITAL_COMMUNITY): Payer: Self-pay | Admitting: Cardiology

## 2023-03-17 ENCOUNTER — Ambulatory Visit (INDEPENDENT_AMBULATORY_CARE_PROVIDER_SITE_OTHER): Payer: Medicare HMO | Admitting: Gastroenterology

## 2023-03-17 ENCOUNTER — Other Ambulatory Visit (HOSPITAL_COMMUNITY): Payer: Self-pay

## 2023-03-17 DIAGNOSIS — E785 Hyperlipidemia, unspecified: Secondary | ICD-10-CM

## 2023-03-17 DIAGNOSIS — N1831 Chronic kidney disease, stage 3a: Secondary | ICD-10-CM

## 2023-03-17 DIAGNOSIS — I214 Non-ST elevation (NSTEMI) myocardial infarction: Secondary | ICD-10-CM | POA: Diagnosis not present

## 2023-03-17 DIAGNOSIS — E1169 Type 2 diabetes mellitus with other specified complication: Secondary | ICD-10-CM

## 2023-03-17 DIAGNOSIS — E1121 Type 2 diabetes mellitus with diabetic nephropathy: Secondary | ICD-10-CM

## 2023-03-17 LAB — BASIC METABOLIC PANEL
Anion gap: 11 (ref 5–15)
Anion gap: 11 (ref 5–15)
BUN: 10 mg/dL (ref 8–23)
BUN: 11 mg/dL (ref 8–23)
CO2: 24 mmol/L (ref 22–32)
CO2: 21 mmol/L — ABNORMAL LOW (ref 22–32)
Calcium: 9.3 mg/dL (ref 8.9–10.3)
Calcium: 9.4 mg/dL (ref 8.9–10.3)
Chloride: 98 mmol/L (ref 98–111)
Chloride: 103 mmol/L (ref 98–111)
Creatinine, Ser: 1.34 mg/dL — ABNORMAL HIGH (ref 0.44–1.00)
Creatinine, Ser: 1.19 mg/dL — ABNORMAL HIGH (ref 0.44–1.00)
GFR, Estimated: 41 mL/min — ABNORMAL LOW (ref >60)
GFR, Estimated: 47 mL/min — ABNORMAL LOW (ref >60)
Glucose, Bld: 144 mg/dL — ABNORMAL HIGH (ref 70–99)
Glucose, Bld: 156 mg/dL — ABNORMAL HIGH (ref 70–99)
Potassium: 3.6 mmol/L (ref 3.5–5.1)
Potassium: 3.8 mmol/L (ref 3.5–5.1)
Sodium: 133 mmol/L — ABNORMAL LOW (ref 135–145)
Sodium: 135 mmol/L (ref 135–145)

## 2023-03-17 LAB — URINALYSIS, ROUTINE W REFLEX MICROSCOPIC
Bacteria, UA: NONE SEEN
Bilirubin Urine: NEGATIVE
Glucose, UA: >=500 mg/dL — AB
Hgb urine dipstick: NEGATIVE
Ketones, ur: NEGATIVE mg/dL
Leukocytes,Ua: NEGATIVE
Nitrite: NEGATIVE
Protein, ur: NEGATIVE mg/dL
Specific Gravity, Urine: 1.003 — ABNORMAL LOW (ref 1.005–1.030)
pH: 7 (ref 5.0–8.0)

## 2023-03-17 LAB — ECHOCARDIOGRAM LIMITED
AR max vel: 2.32 cm2
AV Peak grad: 3.3 mmHg
Ao pk vel: 0.9 m/s
Area-P 1/2: 5.13 cm2
Height: 65 in
S' Lateral: 2.9 cm
Weight: 2208.13 oz

## 2023-03-17 LAB — GLUCOSE, CAPILLARY
Glucose-Capillary: 107 mg/dL — ABNORMAL HIGH (ref 70–99)
Glucose-Capillary: 119 mg/dL — ABNORMAL HIGH (ref 70–99)
Glucose-Capillary: 145 mg/dL — ABNORMAL HIGH (ref 70–99)
Glucose-Capillary: 162 mg/dL — ABNORMAL HIGH (ref 70–99)
Glucose-Capillary: 236 mg/dL — ABNORMAL HIGH (ref 70–99)

## 2023-03-17 LAB — CBC
HCT: 36.8 % (ref 36.0–46.0)
HCT: 37.1 % (ref 36.0–46.0)
Hemoglobin: 12.3 g/dL (ref 12.0–15.0)
Hemoglobin: 12.5 g/dL (ref 12.0–15.0)
MCH: 30.8 pg (ref 26.0–34.0)
MCH: 30.8 pg (ref 26.0–34.0)
MCHC: 33.4 g/dL (ref 30.0–36.0)
MCHC: 33.7 g/dL (ref 30.0–36.0)
MCV: 92 fL (ref 80.0–100.0)
MCV: 91.4 fL (ref 80.0–100.0)
Platelets: 194 10*3/uL (ref 150–400)
Platelets: 195 10*3/uL (ref 150–400)
RBC: 4 MIL/uL (ref 3.87–5.11)
RBC: 4.06 MIL/uL (ref 3.87–5.11)
RDW: 12.6 % (ref 11.5–15.5)
RDW: 12.5 % (ref 11.5–15.5)
WBC: 6.8 10*3/uL (ref 4.0–10.5)
WBC: 5.6 10*3/uL (ref 4.0–10.5)
nRBC: 0 % (ref 0.0–0.2)
nRBC: 0 % (ref 0.0–0.2)

## 2023-03-17 LAB — MRSA NEXT GEN BY PCR, NASAL: MRSA by PCR Next Gen: NOT DETECTED

## 2023-03-17 LAB — HEPARIN LEVEL (UNFRACTIONATED): Heparin Unfractionated: 0.29 IU/mL — ABNORMAL LOW (ref 0.30–0.70)

## 2023-03-17 MED ORDER — PANTOPRAZOLE SODIUM 40 MG PO TBEC
40.0000 mg | DELAYED_RELEASE_TABLET | Freq: Every day | ORAL | Status: DC
  Administered 2023-03-17 – 2023-03-22 (×6): 40 mg via ORAL
  Filled 2023-03-17 (×6): qty 1

## 2023-03-17 MED ORDER — CARVEDILOL 6.25 MG PO TABS
9.3750 mg | ORAL_TABLET | Freq: Two times a day (BID) | ORAL | Status: DC
  Administered 2023-03-17: 9.375 mg via ORAL
  Filled 2023-03-17: qty 1

## 2023-03-17 MED ORDER — SODIUM CHLORIDE 0.9 % IV SOLN: 250.0000 mL | INTRAVENOUS | Status: DC | PRN

## 2023-03-17 MED ORDER — BENAZEPRIL HCL 20 MG PO TABS
40.0000 mg | ORAL_TABLET | Freq: Every day | ORAL | Status: DC
  Administered 2023-03-17: 40 mg via ORAL
  Filled 2023-03-17 (×2): qty 2

## 2023-03-17 MED ORDER — SODIUM CHLORIDE 0.9% FLUSH
3.0000 mL | Freq: Two times a day (BID) | INTRAVENOUS | Status: DC
  Administered 2023-03-17 – 2023-03-22 (×11): 3 mL via INTRAVENOUS

## 2023-03-17 MED ORDER — OYSTER SHELL CALCIUM/D3 500-5 MG-MCG PO TABS
1.0000 | ORAL_TABLET | Freq: Every day | ORAL | Status: DC
  Administered 2023-03-17 – 2023-03-22 (×5): 1 via ORAL
  Filled 2023-03-17 (×6): qty 1

## 2023-03-17 MED ORDER — LABETALOL HCL 5 MG/ML IV SOLN: 10.0000 mg | INTRAVENOUS | Status: AC | PRN

## 2023-03-17 MED ORDER — CLOPIDOGREL BISULFATE 75 MG PO TABS
75.0000 mg | ORAL_TABLET | Freq: Every day | ORAL | Status: DC
  Administered 2023-03-17: 75 mg via ORAL
  Filled 2023-03-17: qty 1

## 2023-03-17 MED ORDER — EMPAGLIFLOZIN 10 MG PO TABS
10.0000 mg | ORAL_TABLET | Freq: Every day | ORAL | Status: DC
  Administered 2023-03-17 – 2023-03-18 (×2): 10 mg via ORAL
  Filled 2023-03-17 (×2): qty 1

## 2023-03-17 MED ORDER — HEPARIN (PORCINE) 25000 UT/250ML-% IV SOLN
850.0000 [IU]/h | INTRAVENOUS | Status: DC
  Administered 2023-03-17: 750 [IU]/h via INTRAVENOUS
  Filled 2023-03-17: qty 250

## 2023-03-17 MED ORDER — ROSUVASTATIN CALCIUM 20 MG PO TABS
40.0000 mg | ORAL_TABLET | Freq: Every day | ORAL | Status: DC
  Administered 2023-03-17 – 2023-03-22 (×6): 40 mg via ORAL
  Filled 2023-03-17 (×7): qty 2

## 2023-03-17 MED ORDER — INSULIN ASPART 100 UNIT/ML IJ SOLN
0.0000 [IU] | Freq: Three times a day (TID) | INTRAMUSCULAR | Status: DC
  Administered 2023-03-17 – 2023-03-18 (×2): 3 [IU] via SUBCUTANEOUS
  Administered 2023-03-18: 1 [IU] via SUBCUTANEOUS
  Administered 2023-03-19: 2 [IU] via SUBCUTANEOUS
  Administered 2023-03-20: 1 [IU] via SUBCUTANEOUS
  Administered 2023-03-20 – 2023-03-21 (×3): 2 [IU] via SUBCUTANEOUS
  Administered 2023-03-22: 3 [IU] via SUBCUTANEOUS

## 2023-03-17 MED ORDER — ACETAMINOPHEN 325 MG PO TABS: 650.0000 mg | ORAL_TABLET | ORAL | Status: DC | PRN

## 2023-03-17 MED ORDER — SODIUM CHLORIDE 0.9 % WEIGHT BASED INFUSION
1.0000 mL/kg/h | INTRAVENOUS | Status: AC
  Administered 2023-03-17: 1 mL/kg/h via INTRAVENOUS

## 2023-03-17 MED ORDER — CARVEDILOL 6.25 MG PO TABS
6.2500 mg | ORAL_TABLET | Freq: Two times a day (BID) | ORAL | Status: DC
  Administered 2023-03-17: 6.25 mg via ORAL
  Filled 2023-03-17: qty 1

## 2023-03-17 MED ORDER — SODIUM CHLORIDE 0.9% FLUSH: 3.0000 mL | INTRAVENOUS | Status: DC | PRN

## 2023-03-17 MED ORDER — HALOPERIDOL LACTATE 5 MG/ML IJ SOLN
1.0000 mg | Freq: Once | INTRAMUSCULAR | Status: AC
  Filled 2023-03-17: qty 1

## 2023-03-17 MED ORDER — HYDRALAZINE HCL 20 MG/ML IJ SOLN: 10.0000 mg | INTRAMUSCULAR | Status: AC | PRN

## 2023-03-17 MED ORDER — CHLORHEXIDINE GLUCONATE CLOTH 2 % EX PADS
6.0000 | MEDICATED_PAD | Freq: Every day | CUTANEOUS | Status: DC
  Administered 2023-03-17: 6 via TOPICAL

## 2023-03-17 MED ORDER — ACETAMINOPHEN ER 650 MG PO TBCR: 650.0000 mg | EXTENDED_RELEASE_TABLET | Freq: Three times a day (TID) | ORAL | Status: DC | PRN

## 2023-03-17 MED ORDER — ASPIRIN 81 MG PO TBEC
81.0000 mg | DELAYED_RELEASE_TABLET | Freq: Every day | ORAL | Status: DC
  Administered 2023-03-17 – 2023-03-20 (×4): 81 mg via ORAL
  Filled 2023-03-17 (×4): qty 1

## 2023-03-17 MED ORDER — HALOPERIDOL 0.5 MG PO TABS
0.5000 mg | ORAL_TABLET | Freq: Once | ORAL | Status: AC
  Administered 2023-03-17: 0.5 mg via ORAL
  Filled 2023-03-17: qty 1

## 2023-03-17 MED ORDER — AMLODIPINE BESYLATE 10 MG PO TABS
10.0000 mg | ORAL_TABLET | Freq: Every evening | ORAL | Status: DC
  Administered 2023-03-17: 10 mg via ORAL
  Filled 2023-03-17: qty 1

## 2023-03-17 MED ORDER — CLOPIDOGREL BISULFATE 300 MG PO TABS
300.0000 mg | ORAL_TABLET | Freq: Once | ORAL | Status: AC
  Administered 2023-03-17: 300 mg via ORAL
  Filled 2023-03-17: qty 1

## 2023-03-17 MED ORDER — DAPAGLIFLOZIN PROPANEDIOL 10 MG PO TABS
10.0000 mg | ORAL_TABLET | Freq: Every day | ORAL | Status: DC
  Filled 2023-03-17: qty 1

## 2023-03-17 MED ORDER — ORAL CARE MOUTH RINSE: 15.0000 mL | OROMUCOSAL | Status: DC | PRN

## 2023-03-17 MED ORDER — INSULIN ASPART 100 UNIT/ML IJ SOLN
0.0000 [IU] | Freq: Three times a day (TID) | INTRAMUSCULAR | Status: DC
  Administered 2023-03-17: 2 [IU] via SUBCUTANEOUS
  Administered 2023-03-17: 1 [IU] via SUBCUTANEOUS

## 2023-03-17 MED ORDER — ONDANSETRON HCL 4 MG/2ML IJ SOLN
4.0000 mg | Freq: Four times a day (QID) | INTRAMUSCULAR | Status: DC | PRN
  Administered 2023-03-18: 4 mg via INTRAVENOUS
  Filled 2023-03-17: qty 2

## 2023-03-17 NOTE — Progress Notes (Signed)
ANTICOAGULATION CONSULT NOTE  Pharmacy Consult for Heparin  Indication: chest pain/ACS, s/p cath, planning for 48 hours of heparin  Allergies  Allergen Reactions   Metformin And Related Other (See Comments)    CKD stage 4    Patient Measurements: Height: 5\' 5"  (165.1 cm) Weight: 62.6 kg (138 lb 0.1 oz) IBW/kg (Calculated) : 57  Vital Signs: Temp: 98.7 F (37.1 C) (05/30 1136) Temp Source: Oral (05/30 1136) BP: 143/91 (05/30 1300) Pulse Rate: 82 (05/30 1300)  Labs: Recent Labs    03/16/23 2032 03/16/23 2223 03/17/23 0132 03/17/23 0849 03/17/23 1309  HGB 12.8  --  12.3 12.5  --   HCT 38.5  --  36.8 37.1  --   PLT 218  --  194 195  --   APTT 29  --   --   --   --   LABPROT 14.5  --   --   --   --   INR 1.1  --   --   --   --   HEPARINUNFRC  --   --   --   --  0.29*  CREATININE 1.26*  --  1.34* 1.19*  --   TROPONINIHS 2,557* 3,099*  --   --   --      Estimated Creatinine Clearance: 35.6 mL/min (A) (by C-G formula based on SCr of 1.19 mg/dL (H)).   Medical History: Past Medical History:  Diagnosis Date   Diabetes mellitus type II    Heart murmur    Hyperlipidemia    Hypertension    Renal disorder    Sleep apnea     Assessment: 77 y/o F s/p cath, starting heparin 2 hours after TR band removal, planning for 48 hours of therapy. No culprit lesions identified. CBC good. Scr 1.19. PTA meds reviewed.   Heparin level slightly subtherapeutic at 0.29. Hgb/plt stable. No issues with infusion or bleeding per RN.  Goal of Therapy:  Heparin level 0.3-0.7 units/ml Monitor platelets by anticoagulation protocol: Yes   Plan:  Increase heparin to 850 units/hr x48 hrs  Daily CBC/heparin level Monitor for bleeding  Andreas Ohm, PharmD Pharmacy Resident  03/17/2023 2:34 PM

## 2023-03-17 NOTE — Progress Notes (Signed)
Echocardiogram 2D Echocardiogram has been performed.  Mikayla Jenkins 03/17/2023, 2:10 PM

## 2023-03-17 NOTE — Progress Notes (Signed)
ANTICOAGULATION CONSULT NOTE - Initial Consult  Pharmacy Consult for Heparin  Indication: chest pain/ACS, s/p cath, planning for 48 hours of heparin  Allergies  Allergen Reactions   Metformin And Related Other (See Comments)    CKD stage 4    Patient Measurements: Height: 5\' 5"  (165.1 cm) Weight: 62.6 kg (138 lb 0.1 oz) IBW/kg (Calculated) : 57  Vital Signs: Temp: 98 F (36.7 C) (05/30 0028) Temp Source: Oral (05/30 0028) BP: 145/87 (05/30 0300) Pulse Rate: 67 (05/30 0300)  Labs: Recent Labs    03/16/23 2032 03/16/23 2223 03/17/23 0132  HGB 12.8  --  12.3  HCT 38.5  --  36.8  PLT 218  --  194  APTT 29  --   --   LABPROT 14.5  --   --   INR 1.1  --   --   CREATININE 1.26*  --  1.34*  TROPONINIHS 2,557* 3,099*  --     Estimated Creatinine Clearance: 31.6 mL/min (A) (by C-G formula based on SCr of 1.34 mg/dL (H)).   Medical History: Past Medical History:  Diagnosis Date   Diabetes mellitus type II    Heart murmur    Hyperlipidemia    Hypertension    Renal disorder    Sleep apnea     Assessment: 77 y/o F s/p cath, starting heparin 2 hours after TR band removal, planning for 48 hours of therapy. No culprit lesions identified. CBC good. Scr 1.34. PTA meds reviewed.   Goal of Therapy:  Heparin level 0.3-0.7 units/ml Monitor platelets by anticoagulation protocol: Yes   Plan:  Start heparin at 750 units/hr 2 hours after TR band removal 1300 heparin level Daily CBC/heparin level Monitor for bleeding  Abran Duke, PharmD, BCPS Clinical Pharmacist Phone: [phone]

## 2023-03-17 NOTE — TOC Benefit Eligibility Note (Addendum)
Patient Product/process development scientist completed.    The patient is currently admitted and upon discharge could be taking clopidogrel (Plavix) 75 mg tablets.  The current 30 day co-pay is $0.00.   The patient is currently admitted and upon discharge could be taking Farxiga 10 mg tablets.  The current 30 day co-pay is $95.00.   The patient is currently admitted and upon discharge could be taking Jardiance 10 mg tablets.  The current 30 day co-pay is $45.00.   The patient is currently admitted and upon discharge could be taking Eliquis 5 mg tablets.  The current 30 day co-pay is $45.00.   The patient is insured through [employer] Part D   This test claim was processed through [facility]- copay amounts may vary at other pharmacies due to pharmacy/plan contracts, or as the patient moves through the different stages of their insurance plan.  Mikayla Jenkins, CPHT Pharmacy Patient Advocate Specialist [facility] Patient Advocate Team Direct Number: [phone]  Fax: [phone]

## 2023-03-17 NOTE — Progress Notes (Signed)
Rounding Note    Patient Name: Mikayla Jenkins Date of Encounter: 03/17/2023  [facility] Cardiologist: Marjo Bicker, MD  Patient Profile     77 y.o. female  is a 77 y.o. female with HTN, T2DM, HLD, CKD who is being seen 03/17/2023 for the evaluation of STEMI.  EKG was borderline for STEMI but changes had resolved upon arrival to [facility].  She did have elevated troponin to 2000 range did increase up to 3000 on follow-up check.  She was taken urgently to the catheterization lab as an urgent non-STEMI and found to have normal coronary arteries.  Consider cleared culprit lesion versus MINOCA.  Subjective   No further back or shoulder pain.  Just seems tired.  Assessment & Plan    Principal Problem:   NSTEMI (non-ST elevated myocardial infarction) (HCC) Active Problems:   Type 2 diabetes mellitus with nephropathy (HCC)   Hyperlipidemia associated with type 2 diabetes mellitus (HCC)   HTN (hypertension)   CKD (chronic kidney disease), stage III (HCC)  Principal Problem:   NSTEMI (non-ST elevated myocardial infarction) (HCC) -> cardiac cath with nonobstructive CAD.  Differential diagnosis would be plaque rupture and clearance with initial treatment using IV heparin and nitroglycerin versus MINOCA, also in the differential would be myocarditis.  No symptoms to suggest pericarditis.  Actually presentation is consistent with ACS and therefore small microvascular versus microvascular disease and the most likely etiology. 48 hours IV heparin We will do DAPT for at least 6 months (okay to interrupt) Continue home medications as she was already on most GDMT medications including carvedilol, amlodipine and benazepril. Holding home dose of clonidine which will allow me to titrate carvedilol to 9.375 mg twice daily today Since she is on high-dose amlodipine as well, would only consider Ranexa if symptoms warrant. Switch statins to rosuvastatin 40 mg Echo pending, depending on  findings may consider further evaluation-cardiac MRI is possible although unlikely if we are considering the possibility of myocarditis.   Active Problems:   Type 2 diabetes mellitus with nephropathy (HCC) -> sliding scale insulin for now, but will reinitiate home meds prior to discharge. Anticipate SGLT2 inhibitor   Hyperlipidemia associated with type 2 diabetes mellitus (HCC) Converted statin to rosuvastatin 40 mg   HTN (hypertension) Started home meds with exception of nightly clonidine and daily thiazide diuretic. Will titrate carvedilol to 9.375 mg twice daily today would like to get the 12.5 mg twice daily prior to discharge.   CKD (chronic kidney disease), stage III (HCC) Urine output stable today. Pending labs today.   Plan is to get echo today and ambulate.  Transfer to telemetry.  Would hope to consider discharge by tomorrow afternoon if stable   Inpatient Medications    Scheduled Meds:  amLODipine  10 mg Oral QPM   aspirin EC  81 mg Oral Daily   benazepril  40 mg Oral Daily   calcium-vitamin D  1 tablet Oral Daily   carvedilol  6.25 mg Oral BID WC   Chlorhexidine Gluconate Cloth  6 each Topical Q0600   clopidogrel  75 mg Oral Q breakfast   insulin aspart  0-9 Units Subcutaneous TID WC   pantoprazole  40 mg Oral Daily   rosuvastatin  40 mg Oral Daily   sodium chloride flush  3 mL Intravenous Q12H   Continuous Infusions:  sodium chloride 10 mL/hr at 03/17/23 0800   sodium chloride     sodium chloride     sodium chloride 1  mL/kg/hr (03/17/23 0800)   heparin 750 Units/hr (03/17/23 0800)   PRN Meds: sodium chloride, acetaminophen, nitroGLYCERIN, ondansetron (ZOFRAN) IV, mouth rinse, sodium chloride flush   Vital Signs    Vitals:   03/17/23 0600 03/17/23 0700 03/17/23 0758 03/17/23 0800  BP:    (!) 131/93  Pulse: 68 67  71  Resp: 16 17  14   Temp:   98.5 F (36.9 C)   TempSrc:   Oral   SpO2: 99% 100%  99%  Weight:      Height:        Intake/Output  Summary (Last 24 hours) at 03/17/2023 0825 Last data filed at 03/17/2023 0800 Gross per 24 hour  Intake 569.88 ml  Output 400 ml  Net 169.88 ml      03/17/2023   12:28 AM 03/16/2023    8:17 PM 03/15/2023    2:05 PM  Last 3 Weights  Weight (lbs) 138 lb 0.1 oz 137 lb 136 lb 12.8 oz  Weight (kg) 62.6 kg 62.143 kg 62.052 kg      Telemetry    Sinus rhythm- Personally Reviewed  ECG    Normal sinus rhythm-rate 71 bpm; right axis deviation.  RBBB.  Cannot rule out RVH.  (Computer reads possible septal infarct-likely normal variant) T wave abnormality, consider inferolateral ischemia - Personally Reviewed  When compared to initial EKG from [facility], T wave version notably improved as are the subtle ST elevations in anterolateral limb leads.  Physical Exam   GEN: No acute distress.  Seems a little bit sleepy but okay. Neck: No JVD Cardiac: Somewhat hyperdynamic precordium.  RRR, no murmurs, rubs, or gallops.  Respiratory: Clear to auscultation bilaterally.  Nonlabored, good air movement. GI: Soft, nontender, non-distended  MS: No edema; No deformity. Neuro:  Nonfocal  Psych: Normal affect   Labs    High Sensitivity Troponin:   Recent Labs  Lab 03/16/23 2032 03/16/23 2223  TROPONINIHS 2,557* 3,099*     Chemistry Recent Labs  Lab 03/16/23 2032 03/17/23 0132  NA 134* 133*  K 3.6 3.6  CL 100 98  CO2 24 24  GLUCOSE 153* 144*  BUN 15 10  CREATININE 1.26* 1.34*  CALCIUM 9.6 9.3  PROT 7.7  --   ALBUMIN 4.5  --   AST 29  --   ALT 18  --   ALKPHOS 85  --   BILITOT 1.0  --   GFRNONAA 44* 41*  ANIONGAP 10 11    Lipids  Recent Labs  Lab 03/16/23 2032  CHOL 207*  TRIG 75  HDL 107  LDLCALC 85  CHOLHDL 1.9    Hematology Recent Labs  Lab 03/16/23 2032 03/17/23 0132  WBC 6.6 6.8  RBC 4.09 4.00  HGB 12.8 12.3  HCT 38.5 36.8  MCV 94.1 92.0  MCH 31.3 30.8  MCHC 33.2 33.4  RDW 12.7 12.6  PLT 218 194   Thyroid No results for input(s): "TSH",  "FREET4" in the last 168 hours.  BNPNo results for input(s): "BNP", "PROBNP" in the last 168 hours.  DDimer No results for input(s): "DDIMER" in the last 168 hours.   Radiology    CARDIAC CATHETERIZATION  Result Date: 03/17/2023 Angiographically normal coronary artery disease with no obvious culprit lesion. Codominant system. Difficult to image left ventriculography, would recommend 2D echo but appears to be normal EF. Normal EDP. Plan: Will need to consider different etiology for troponin elevation however for now we will go ahead and treat as though this  could have been ACS with a lesion that cleared with nitroglycerin, aspirin and heparin.. For now we will treat with DAPT and 40 hours of IV heparin until other etiology is determined. Check 2D echo to better assess EF and consider cardiac MRI as there is concern for troponin elevation and her discomfort being related to myocarditis. Will continue most home medications but hold the thiazide diuretic and clonidine nightly. Sliding-scale insulin. Standard labs checked with A1c, lipid panel etc. Anticipate relatively short stay depending additional evaluation.   DG Thoracic Spine 2 View  Result Date: 03/16/2023 CLINICAL DATA:  Chest pain upper back pain EXAM: THORACIC SPINE 2 VIEWS COMPARISON:  None Available. FINDINGS: Scoliosis of the thoracolumbar spine. Vertebral body heights are grossly maintained. Multilevel degenerative osteophytes. IMPRESSION: Scoliosis and degenerative changes. Electronically Signed   By: Jasmine Pang M.D.   On: 03/16/2023 21:23   DG Chest 2 View  Result Date: 03/16/2023 CLINICAL DATA:  Chest pain EXAM: CHEST - 2 VIEW COMPARISON:  01/06/2023 FINDINGS: The heart size and mediastinal contours are within normal limits. Both lungs are clear. Scoliosis. Aortic atherosclerosis. Minimal scarring left lung base. IMPRESSION: No active cardiopulmonary disease. Electronically Signed   By: Jasmine Pang M.D.   On: 03/16/2023 21:22     Cardiac Studies       For questions or updates, please contact [facility] Please consult [email or web address] for contact info under        Signed, Bryan Lemma, MD  03/17/2023, 8:25 AM

## 2023-03-18 ENCOUNTER — Other Ambulatory Visit (HOSPITAL_COMMUNITY): Payer: Self-pay

## 2023-03-18 ENCOUNTER — Encounter (HOSPITAL_COMMUNITY): Payer: Self-pay | Admitting: Cardiology

## 2023-03-18 DIAGNOSIS — I249 Acute ischemic heart disease, unspecified: Secondary | ICD-10-CM | POA: Diagnosis not present

## 2023-03-18 DIAGNOSIS — I5181 Takotsubo syndrome: Secondary | ICD-10-CM

## 2023-03-18 DIAGNOSIS — N1831 Chronic kidney disease, stage 3a: Secondary | ICD-10-CM | POA: Diagnosis not present

## 2023-03-18 DIAGNOSIS — E1121 Type 2 diabetes mellitus with diabetic nephropathy: Secondary | ICD-10-CM | POA: Diagnosis not present

## 2023-03-18 DIAGNOSIS — I5021 Acute systolic (congestive) heart failure: Secondary | ICD-10-CM

## 2023-03-18 DIAGNOSIS — I1A0 Resistant hypertension: Secondary | ICD-10-CM

## 2023-03-18 DIAGNOSIS — E1169 Type 2 diabetes mellitus with other specified complication: Secondary | ICD-10-CM | POA: Diagnosis not present

## 2023-03-18 LAB — CBC
HCT: 39.3 % (ref 36.0–46.0)
Hemoglobin: 13.2 g/dL (ref 12.0–15.0)
MCH: 30.8 pg (ref 26.0–34.0)
MCHC: 33.6 g/dL (ref 30.0–36.0)
MCV: 91.8 fL (ref 80.0–100.0)
Platelets: 205 10*3/uL (ref 150–400)
RBC: 4.28 MIL/uL (ref 3.87–5.11)
RDW: 12.7 % (ref 11.5–15.5)
WBC: 9.1 10*3/uL (ref 4.0–10.5)
nRBC: 0 % (ref 0.0–0.2)

## 2023-03-18 LAB — HEMOGLOBIN A1C
Hgb A1c MFr Bld: 6.9 % — ABNORMAL HIGH (ref 4.8–5.6)
Mean Plasma Glucose: 151 mg/dL

## 2023-03-18 LAB — BASIC METABOLIC PANEL
Anion gap: 13 (ref 5–15)
BUN: 13 mg/dL (ref 8–23)
CO2: 21 mmol/L — ABNORMAL LOW (ref 22–32)
Calcium: 9.3 mg/dL (ref 8.9–10.3)
Chloride: 94 mmol/L — ABNORMAL LOW (ref 98–111)
Creatinine, Ser: 1.42 mg/dL — ABNORMAL HIGH (ref 0.44–1.00)
GFR, Estimated: 38 mL/min — ABNORMAL LOW (ref >60)
Glucose, Bld: 189 mg/dL — ABNORMAL HIGH (ref 70–99)
Potassium: 3.5 mmol/L (ref 3.5–5.1)
Sodium: 128 mmol/L — ABNORMAL LOW (ref 135–145)

## 2023-03-18 LAB — GLUCOSE, CAPILLARY
Glucose-Capillary: 143 mg/dL — ABNORMAL HIGH (ref 70–99)
Glucose-Capillary: 202 mg/dL — ABNORMAL HIGH (ref 70–99)
Glucose-Capillary: 107 mg/dL — ABNORMAL HIGH (ref 70–99)
Glucose-Capillary: 155 mg/dL — ABNORMAL HIGH (ref 70–99)

## 2023-03-18 MED ORDER — CLONIDINE HCL 0.2 MG PO TABS
0.3000 mg | ORAL_TABLET | Freq: Every day | ORAL | Status: AC
  Administered 2023-03-18: 0.3 mg via ORAL
  Filled 2023-03-18: qty 1

## 2023-03-18 MED ORDER — IRBESARTAN 75 MG PO TABS
75.0000 mg | ORAL_TABLET | Freq: Every day | ORAL | Status: DC
  Administered 2023-03-18: 75 mg via ORAL
  Filled 2023-03-18: qty 1

## 2023-03-18 MED ORDER — CARVEDILOL 12.5 MG PO TABS
12.5000 mg | ORAL_TABLET | Freq: Two times a day (BID) | ORAL | Status: DC
  Administered 2023-03-18: 12.5 mg via ORAL
  Filled 2023-03-18: qty 1

## 2023-03-18 MED ORDER — CLONIDINE HCL 0.1 MG PO TABS
0.1000 mg | ORAL_TABLET | Freq: Every day | ORAL | Status: AC
  Administered 2023-03-20: 0.1 mg via ORAL
  Filled 2023-03-18: qty 1

## 2023-03-18 MED ORDER — CLONIDINE HCL 0.2 MG PO TABS
0.2000 mg | ORAL_TABLET | Freq: Every day | ORAL | Status: AC
  Filled 2023-03-18: qty 1

## 2023-03-18 MED ORDER — POTASSIUM CHLORIDE CRYS ER 20 MEQ PO TBCR
40.0000 meq | EXTENDED_RELEASE_TABLET | Freq: Once | ORAL | Status: AC
  Administered 2023-03-18: 40 meq via ORAL
  Filled 2023-03-18: qty 2

## 2023-03-18 NOTE — TOC Benefit Eligibility Note (Signed)
Patient Advocate Encounter  Insurance verification completed.    The patient is currently admitted and upon discharge could be taking Entresto 24-26 mg.  The current 30 day co-pay is $45.00.   The patient is insured through [employer] Part D   This test claim was processed through [facility]- copay amounts may vary at other pharmacies due to pharmacy/plan contracts, or as the patient moves through the different stages of their insurance plan.  Olsen Mccutchan, CPHT Pharmacy Patient Advocate Specialist [facility] Patient Advocate Team Direct Number: [phone]  Fax: [phone]       

## 2023-03-18 NOTE — Plan of Care (Signed)
  Problem: Education: Goal: Knowledge of General Education information will improve Description: Including pain rating scale, medication(s)/side effects and non-pharmacologic comfort measures Outcome: Progressing   Problem: Health Behavior/Discharge Planning: Goal: Ability to manage health-related needs will improve Outcome: Progressing   Problem: Clinical Measurements: Goal: Cardiovascular complication will be avoided Outcome: Progressing   Problem: Activity: Goal: Risk for activity intolerance will decrease Outcome: Progressing   Problem: Coping: Goal: Level of anxiety will decrease Outcome: Progressing   Problem: Safety: Goal: Ability to remain free from injury will improve Outcome: Progressing   Problem: Pain Managment: Goal: General experience of comfort will improve Outcome: Progressing

## 2023-03-18 NOTE — Progress Notes (Addendum)
Pt was confused, disoriented to place, time and situation. Pulling out lines and trying to get out of bed a couple of times overnight.  Daughter present at bedside. This was not her baseline as per the daughter. Bedtime CBG was 236. VS stable. Night on call Dr. made aware. Insulin was ordered and a one dose of 0.5mg  PO haldol.   Pt still trying to get out of bed. Refused blood draw early this morning. I have asked phlebotomist to come back later in the am. Re oriented patient. Family at bedside. Bed alarm kept on.

## 2023-03-18 NOTE — Plan of Care (Signed)

## 2023-03-18 NOTE — Care Management Important Message (Signed)
Important Message  Patient Details  Name: Mikayla Jenkins MRN: [id number] Date of Birth: 03-Dec-1945   Medicare Important Message Given:  Yes     Renie Ora 03/18/2023, 11:17 AM

## 2023-03-18 NOTE — Progress Notes (Addendum)
Rounding Note    Patient Name: Mikayla Jenkins Date of Encounter: 03/18/2023  [facility] Cardiologist: Marjo Bicker, MD   Subjective   Sitting up in bed, developed confusion/delirium last evening. Received Haldol. Family at bedside.   Still a bit confused -- but no longer agitated.   Inpatient Medications    Scheduled Meds:  aspirin EC  81 mg Oral Daily   calcium-vitamin D  1 tablet Oral Daily   carvedilol  12.5 mg Oral BID WC   clopidogrel  75 mg Oral Q breakfast   empagliflozin  10 mg Oral Daily   insulin aspart  0-9 Units Subcutaneous TID WC   irbesartan  75 mg Oral Daily   pantoprazole  40 mg Oral Daily   rosuvastatin  40 mg Oral Daily   sodium chloride flush  3 mL Intravenous Q12H   Continuous Infusions:  sodium chloride 10 mL/hr at 03/18/23 0104   sodium chloride     sodium chloride     PRN Meds: sodium chloride, acetaminophen, nitroGLYCERIN, ondansetron (ZOFRAN) IV, mouth rinse, sodium chloride flush   Vital Signs    Vitals:   03/17/23 1509 03/17/23 2001 03/17/23 2316 03/18/23 0422  BP: (!) 147/85 136/80 123/81 138/81  Pulse: 70 65 72 97  Resp: 18 16 18 20   Temp: 98.4 F (36.9 C) 99.4 F (37.4 C) 97.9 F (36.6 C) 97.9 F (36.6 C)  TempSrc: Oral Oral Oral Oral  SpO2: 100% 100% 100% 100%  Weight:      Height:        Intake/Output Summary (Last 24 hours) at 03/18/2023 0826 Last data filed at 03/18/2023 0252 Gross per 24 hour  Intake 522.91 ml  Output 700 ml  Net -177.09 ml      03/17/2023   12:28 AM 03/16/2023    8:17 PM 03/15/2023    2:05 PM  Last 3 Weights  Weight (lbs) 138 lb 0.1 oz 137 lb 136 lb 12.8 oz  Weight (kg) 62.6 kg 62.143 kg 62.052 kg      Telemetry    Sinus Tachycardia, deep TWI - Personally Reviewed  ECG    No new tracing this morning  Physical Exam   GEN: No acute distress. In a posey belt. Neck: No JVD Cardiac: RRR, no murmurs, rubs, or gallops.  Respiratory: Clear to auscultation bilaterally. GI:  Soft, nontender, non-distended  MS: No edema; No deformity. Neuro:  Nonfocal  Psych: confused, alert to self   Labs    High Sensitivity Troponin:   Recent Labs  Lab 03/16/23 2032 03/16/23 2223  TROPONINIHS 2,557* 3,099*     Chemistry Recent Labs  Lab 03/16/23 2032 03/17/23 0132 03/17/23 0849  NA 134* 133* 135  K 3.6 3.6 3.8  CL 100 98 103  CO2 24 24 21*  GLUCOSE 153* 144* 156*  BUN 15 10 11   CREATININE 1.26* 1.34* 1.19*  CALCIUM 9.6 9.3 9.4  PROT 7.7  --   --   ALBUMIN 4.5  --   --   AST 29  --   --   ALT 18  --   --   ALKPHOS 85  --   --   BILITOT 1.0  --   --   GFRNONAA 44* 41* 47*  ANIONGAP 10 11 11     Lipids  Recent Labs  Lab 03/16/23 2032  CHOL 207*  TRIG 75  HDL 107  LDLCALC 85  CHOLHDL 1.9    Hematology Recent Labs  Lab  03/16/23 2032 03/17/23 0132 03/17/23 0849  WBC 6.6 6.8 5.6  RBC 4.09 4.00 4.06  HGB 12.8 12.3 12.5  HCT 38.5 36.8 37.1  MCV 94.1 92.0 91.4  MCH 31.3 30.8 30.8  MCHC 33.2 33.4 33.7  RDW 12.7 12.6 12.5  PLT 218 194 195   Thyroid No results for input(s): "TSH", "FREET4" in the last 168 hours.  BNPNo results for input(s): "BNP", "PROBNP" in the last 168 hours.  DDimer No results for input(s): "DDIMER" in the last 168 hours.   Radiology   DG Thoracic Spine 2 View  Result Date: 03/16/2023 CLINICAL DATA:  Chest pain upper back pain EXAM: THORACIC SPINE 2 VIEWS COMPARISON:  None Available. FINDINGS: Scoliosis of the thoracolumbar spine. Vertebral body heights are grossly maintained. Multilevel degenerative osteophytes. IMPRESSION: Scoliosis and degenerative changes. Electronically Signed   By: Jasmine Pang M.D.   On: 03/16/2023 21:23   DG Chest 2 View  Result Date: 03/16/2023 CLINICAL DATA:  Chest pain EXAM: CHEST - 2 VIEW COMPARISON:  01/06/2023 FINDINGS: The heart size and mediastinal contours are within normal limits. Both lungs are clear. Scoliosis. Aortic atherosclerosis. Minimal scarring left lung base. IMPRESSION: No  active cardiopulmonary disease. Electronically Signed   By: Jasmine Pang M.D.   On: 03/16/2023 21:22    Cardiac Studies   Cath: 03/16/2023  Angiographically normal coronary artery disease with no obvious culprit lesion. Codominant system. Difficult to image left ventriculography, would recommend 2D echo but appears to be normal EF. Normal EDP.    Plan: Will need to consider different etiology for troponin elevation however for now we will go ahead and treat as though this could have been ACS with a lesion that cleared with nitroglycerin, aspirin and heparin.. For now we will treat with DAPT and 40 hours of IV heparin until other etiology is determined. Check 2D echo to better assess EF and consider cardiac MRI as there is concern for troponin elevation and her discomfort being related to myocarditis. Will continue most home medications but hold the thiazide diuretic and clonidine nightly. Sliding-scale insulin. Standard labs checked with A1c, lipid panel etc.    Anticipate relatively short stay depending additional evaluation.  Echo: 03/17/2023: LV EF ~25-30%. Mid-Apical Segmental Akinesis in a pattern c/w Takotsubo (Stress-Induced). Gr 1 DD. Normal RV & RVSP.  Severe LAD. Trivial MR.  Mild AoV thickening & normal RAP  Patient Profile     77 y.o. female with HTN, T2DM, HLD, CKD who is being seen 03/17/2023 for the evaluation of STEMI.  EKG was borderline for STEMI but changes had resolved upon arrival to [facility].  She did have elevated troponin to 2000 range did increase up to 3000 on follow-up check.  She was taken urgently to the catheterization lab as an urgent non-STEMI and found to have normal coronary arteries.  Assessment & Plan    Elevated Troponin  - Presented as ACS/ NSTEMI - Normal Coronaries on Cath -- Ech c/w Stress CM  -- underwent cardiac cath noted above with no culprit lesion. Initially concerning for NSTEMI, but with echo finding now felt to be stress cardiomyopathy.  hsTn peaked at 3099. Has been treated with IV heparin - will d/c heparin & Plavix.  -- on ASA, statin, BB, ARB  HFrEF NICM Stress cardiomyopathy -- Echo showed LVEF of 25-30% with wall motion consistent with takostubo cardiomyopathy -- GDMT: increase coreg to 12.5mg  BID, benazepril stopped--> switched to avapro 75mg  daily (ideally transition to [place] prior to DC), jardiance  Long discussion with patient's Dtr (who is a Teacher, early years/pre) re her medication regimen & $$ concerns.   Would like to Optimize GDMT  HTN -- blood pressures stable -- as above coreg 12.5mg  BID, avapro 75mg  (transition to entresto), wean clonidine 0.3mg  qhs--> 0.2qhs-->0.1mg  qhs --. Stopped home thiazide Diuretic & will decreease/stop amlodipine to allow to Entresto Titration.   HLD -- on atorvastatin 40mg  daily   Delirium -- developed last evening, received dose of haldol. Currently in posey belt -- has not been receiving her evening clonidine (as above, will taper) -  Work to Re-direct & minimize distractions. Open windows during daytime to allow for re-orientation.  Will hop for d/c tomorrow once we have had time to transition meds   DM -- Hgb A1c 6.9 -- SSI  -- jardiance (can stop sulfonylurea)  TOC HF to see, case management consult for [facility] assistance   For questions or updates, please contact [facility] Please consult [email or web address] for contact info under        Signed, Laverda Page, NP  03/18/2023, 8:26 AM       ATTENDING ATTESTATION  I have seen, examined and evaluated the patient this AM  along with Laverda Page, NP-C.  After reviewing all the available data and chart, we discussed the patients laboratory, study & physical findings as well as symptoms in detail.  I agree with her findings, examination as well as impression recommendations as per our discussion.    Attending adjustments noted in italics.   Very revealing echocardiogram with findings very consistent with  Takotsubo.  It appears that she had just been at her nephrologist office and had relatively high blood pressures.  This caused her to be quite stressed and anxious about increasing medications.  The plan had been to increase carvedilol to 12.5 mg twice daily.  While driving back from his appointment apparently she started noticing some chest discomfort and then back discomfort.  No other clear inciting event that we can tell.    Presentation was consistent with ACS with elevated troponin and EKG changes, however cardiac catheterization did not reveal any stenoses.  The new finding from the echocardiogram likely Takotsubo cardiomyopathy, we can change the diagnosis from ACS to stress-induced cardiomyopathy.  Plan is the now try to transition to GDMT medications for reduced EF.  We will get her established in the [facility] outpatient follow-up to ensure medication regimen is clearly laid out.  I think she would probably benefit from home health RN to go see her at least once if not twice a week to help out with cleaning out her pillboxes.  Will try to avoid diuretics since she seems to euvolemic.  I think the combination of Entresto and SGLT2 inhibitor should help.  Would hope to try get her discharged soon because of her tendency now to sundown.  Try to avoid medication management of sundowning and would prefer to simply reorient.  This could partially be because of withdrawal from clonidine so we are tapering her off of clonidine p.m.   Hope for discharge tomorrow.   Marykay Lex, MD, MS Bryan Lemma, M.D., M.S. Interventional Cardiologist  [facility]  Pager # [phone] Phone # [phone] [address] [place], [place] [address]

## 2023-03-18 NOTE — Progress Notes (Addendum)
   Heart Failure Stewardship Pharmacist Progress Note   PCP: Kerri Perches, MD PCP-Cardiologist: Marjo Bicker, MD   HPI:  77 YO female with a PMH of HTN, T2DM, HLD, CKD.   [facility] with complaints of chest and back pain as well as nausea. Troponin 2500>>3000 on admission and chest pain continued after medication administration. Patient was urgently taken to the cath lab due to suspicion for STEMI, however, cath revealed normal CAD with no culprit lesion so etiology of CP remains unclear. Per cardiology, possible etiologies include cleared culprit lesion with IV heparin and NTG, MINOCA, and myocarditis. Echo from 5/30 showing EF 25-30%, Takotstubo cardiomyopathy, regional wall motion abnormalities, G1DD, trivial MVR, RAP 3 mmHg, normal PASP.  Current HF Medications: Beta Blocker: carvedilol 9.375 mg BID ACE/ARB/ARNI: benazepril 40 mg daily SGLT2i: Jardiance 10 mg daily  Prior to admission HF Medications: Beta blocker: carvedilol 12.5 mg BID ACE/ARB/ARNI: benazepril 40 mg daily  Pertinent Lab Values: Serum creatinine 1.42, BUN 13, Potassium 3.5, Sodium 128, Magnesium pending, A1c 6.9%   Vital Signs: Weight: 138 lbs (admission weight: 137 lbs) Blood pressure: 130s-140s/80s  Heart rate: 70s-90s  I/O: +77mL yesterday; net +234mL  Medication Assistance / Insurance Benefits Check: Does the patient have prescription insurance?  Yes Type of insurance plan: Medicare [employer])  Does the patient qualify for medication assistance through manufacturers or grants?   Yes Eligible grants and/or patient assistance programs: Valla Leaver Medication assistance applications in progress: Valla Leaver Medication assistance applications approved: None Approved medication assistance renewals will be completed by: N/A  Outpatient Pharmacy:  Prior to admission outpatient pharmacy: [facility] Is the patient willing to use [facility] TOC pharmacy at  discharge? Yes Is the patient willing to transition their outpatient pharmacy to utilize a [facility] outpatient pharmacy?   Pending   Assessment: 1. Acute systolic CHF (LVEF 25-30%), due to NICM. NYHA class III symptoms. -Scr 1.19>>1.42 from yesterday (BL 1.2-1.5). Strict I's and O's. Keep K >4 and Mg >2. Recommend K supplementation.  - Agree with transitioning benazepril to irbesartan 75 mg daily--may need to increase dose tomorrow depending on BP. Hopeful to start [facility] prior to discharge.  - Increase carvedilol to 12.5 mg BID (PTA dose) - Continue Jardiance 10 mg daily and continue to watch Scr (variable at baseline) - Patient's daughter reports that she has been on spironolactone multiple times in the past and is consistently taken off of the medication due to worsening renal function--would hold off on starting for now.   Plan: 1) Medication changes recommended at this time: - Increase carvedilol to 12.5 mg BID  - Transition benazepril to irbesartan 75 mg daily - Give KCl 40 mEq x1  2) Patient assistance: - Farxiga - $95 Valla Leaver - $45 - Patient assistance needed for both Jardiance and Sherryll Burger (if this medication is started prior to discharge)--pharmacy will complete the forms empirically in case of discharge over the weekend.  3)  Education  - Full medication education completed with patient's daughter  - Education with patient to be completed prior to discharge (awaiting continued improvement of AMS)   Cherylin Mylar, PharmD PGY1 Pharmacy Resident 5/31/20247:45 AM

## 2023-03-18 NOTE — Progress Notes (Signed)
Heart Failure Nurse Navigator Progress Note  PCP: Kerri Perches, MD PCP-Cardiologist: Mallipeddi Admission Diagnosis: STEMI Admitted from: Home  Presentation:   [name] presented with upper back pain between shoulder blades, nausea, Patients daughter is a Teacher, early years/pre and doesn't believe her mother has been getting her medications correctly. BNP 3,099, troponin 2,557, BP 167/111, HR 80, taken to cath lab from ED, showed normal CAD with no lesion. 6/2 Cardiac MRI findings showed consistent with Takotsubo cardiomyopathy.  Per daughter patient has slight memory issues and her father takes care of her mother including diet and medications.   Patient and daughter were educated on the sign and symptoms of heart failure, daily weights, when to call her doctor or go to the ED, Diet/ fluid restrictions, taking all medications as prescribed and attending ll medica appointments. Patient and daughter verbalized their understanding of education. Navigator spoke with CSW about helping to get a nurse to home to help with medications. A HF TOC appointment was scheduled for 04/06/2023 @ 2 pm.   ECHO/ LVEF: 25-30%  Clinical Course:  Past Medical History:  Diagnosis Date   Diabetes mellitus type II    Heart murmur    Hyperlipidemia    Hypertension    Renal disorder    Sleep apnea      Social History   Socioeconomic History   Marital status: Married    Spouse name: Jake Shark   Number of children: 3   Years of education: Not on file   Highest education level: High school graduate  Occupational History   Occupation: Retired  Tobacco Use   Smoking status: Never    Passive exposure: Never   Smokeless tobacco: Never  Vaping Use   Vaping Use: Never used  Substance and Sexual Activity   Alcohol use: No   Drug use: No   Sexual activity: Yes    Birth control/protection: Surgical  Other Topics Concern   Not on file  Social History Narrative   Not on file   Social Determinants of Health    Financial Resource Strain: Low Risk  (03/18/2023)   Overall Financial Resource Strain (CARDIA)    Difficulty of Paying Living Expenses: Not very hard  Food Insecurity: No Food Insecurity (03/17/2023)   Hunger Vital Sign    Worried About Running Out of Food in the Last Year: Never true    Ran Out of Food in the Last Year: Never true  Transportation Needs: No Transportation Needs (03/17/2023)   PRAPARE - Administrator, Civil Service (Medical): No    Lack of Transportation (Non-Medical): No  Physical Activity: Insufficiently Active (05/31/2022)   Exercise Vital Sign    Days of Exercise per Week: 3 days    Minutes of Exercise per Session: 30 min  Stress: No Stress Concern Present (05/31/2022)   [employer] of Occupational Health - Occupational Stress Questionnaire    Feeling of Stress : Not at all  Social Connections: Moderately Integrated (05/31/2022)   Social Connection and Isolation Panel [NHANES]    Frequency of Communication with Friends and Family: More than three times a week    Frequency of Social Gatherings with Friends and Family: Once a week    Attends Religious Services: More than 4 times per year    Active Member of [employer] or Organizations: No    Attends Banker Meetings: Never    Marital Status: Married   Water engineer and Provision:  Detailed education and instructions provided on heart failure  disease management including the following:  Signs and symptoms of Heart Failure When to call the physician Importance of daily weights Low sodium diet Fluid restriction Medication management Anticipated future follow-up appointments  Patient education given on each of the above topics.  Patient acknowledges understanding via teach back method and acceptance of all instructions.  Education Materials:  "Living Better With Heart Failure" Booklet, HF zone tool, & Daily Weight Tracker Tool.  Patient has scale at home: yes Patient has pill  box at home: yes     High Risk Criteria for Readmission and/or Poor Patient Outcomes: Heart failure hospital admissions (last 6 months): 0  No Show rate: 1 % Difficult social situation: No Demonstrates medication adherence: Yes Primary Language: English Literacy level: Reading, writing, and comprehension  Barriers of Care:   Diet/ fluids restrictions Daily weights  Considerations/Referrals:   Referral made to Heart Failure Pharmacist Stewardship: yes Referral made to Heart Failure CSW/NCM TOC: No Referral made to Heart & Vascular TOC clinic: Yes, HF TOC 04/06/2023 @ 2 pm   Items for Follow-up on DC/TOC: Diet/ fluid restrictions Daily weights Continued HF education    Rhae Hammock, BSN, RN Heart Failure Teacher, adult education Only

## 2023-03-19 DIAGNOSIS — N179 Acute kidney failure, unspecified: Secondary | ICD-10-CM

## 2023-03-19 DIAGNOSIS — I214 Non-ST elevation (NSTEMI) myocardial infarction: Secondary | ICD-10-CM | POA: Diagnosis not present

## 2023-03-19 DIAGNOSIS — I5041 Acute combined systolic (congestive) and diastolic (congestive) heart failure: Secondary | ICD-10-CM | POA: Diagnosis not present

## 2023-03-19 DIAGNOSIS — I959 Hypotension, unspecified: Secondary | ICD-10-CM | POA: Diagnosis not present

## 2023-03-19 LAB — MAGNESIUM: Magnesium: 1.5 mg/dL — ABNORMAL LOW (ref 1.7–2.4)

## 2023-03-19 LAB — BASIC METABOLIC PANEL
Anion gap: 13 (ref 5–15)
BUN: 16 mg/dL (ref 8–23)
CO2: 23 mmol/L (ref 22–32)
Calcium: 9.1 mg/dL (ref 8.9–10.3)
Chloride: 101 mmol/L (ref 98–111)
Creatinine, Ser: 2.09 mg/dL — ABNORMAL HIGH (ref 0.44–1.00)
GFR, Estimated: 24 mL/min — ABNORMAL LOW (ref >60)
Glucose, Bld: 126 mg/dL — ABNORMAL HIGH (ref 70–99)
Potassium: 4.7 mmol/L (ref 3.5–5.1)
Sodium: 137 mmol/L (ref 135–145)

## 2023-03-19 LAB — CBC
HCT: 33.1 % — ABNORMAL LOW (ref 36.0–46.0)
Hemoglobin: 11.1 g/dL — ABNORMAL LOW (ref 12.0–15.0)
MCH: 31.2 pg (ref 26.0–34.0)
MCHC: 33.5 g/dL (ref 30.0–36.0)
MCV: 93 fL (ref 80.0–100.0)
Platelets: 166 10*3/uL (ref 150–400)
RBC: 3.56 MIL/uL — ABNORMAL LOW (ref 3.87–5.11)
RDW: 12.8 % (ref 11.5–15.5)
WBC: 5.1 10*3/uL (ref 4.0–10.5)
nRBC: 0 % (ref 0.0–0.2)

## 2023-03-19 LAB — GLUCOSE, CAPILLARY
Glucose-Capillary: 114 mg/dL — ABNORMAL HIGH (ref 70–99)
Glucose-Capillary: 174 mg/dL — ABNORMAL HIGH (ref 70–99)
Glucose-Capillary: 92 mg/dL (ref 70–99)
Glucose-Capillary: 122 mg/dL — ABNORMAL HIGH (ref 70–99)

## 2023-03-19 LAB — LIPOPROTEIN A (LPA): Lipoprotein (a): 111.9 nmol/L — ABNORMAL HIGH (ref <75.0)

## 2023-03-19 MED ORDER — CARVEDILOL 3.125 MG PO TABS
3.1250 mg | ORAL_TABLET | Freq: Two times a day (BID) | ORAL | Status: DC
  Administered 2023-03-20 – 2023-03-22 (×5): 3.125 mg via ORAL
  Filled 2023-03-19 (×5): qty 1

## 2023-03-19 MED ORDER — CARVEDILOL 3.125 MG PO TABS: 3.1250 mg | ORAL_TABLET | Freq: Two times a day (BID) | ORAL | Status: DC

## 2023-03-19 MED ORDER — MAGNESIUM SULFATE 2 GM/50ML IV SOLN
2.0000 g | Freq: Once | INTRAVENOUS | Status: AC
  Administered 2023-03-19: 2 g via INTRAVENOUS
  Filled 2023-03-19: qty 50

## 2023-03-19 MED ORDER — HEPARIN SODIUM (PORCINE) 5000 UNIT/ML IJ SOLN
5000.0000 [IU] | Freq: Three times a day (TID) | INTRAMUSCULAR | Status: DC
  Administered 2023-03-19 – 2023-03-20 (×4): 5000 [IU] via SUBCUTANEOUS
  Filled 2023-03-19 (×4): qty 1

## 2023-03-19 NOTE — Progress Notes (Signed)
Rounding Note    Patient Name: Mikayla Jenkins Date of Encounter: 03/19/2023  [facility] Cardiologist: Marjo Bicker, MD   Subjective   BP down to 83/46 this morning, now improved to 111/66.  Worsening renal function (Cr 1.42 >2.09), magnesium 1.5.  Denies any chest pain  Inpatient Medications    Scheduled Meds:  aspirin EC  81 mg Oral Daily   calcium-vitamin D  1 tablet Oral Daily   carvedilol  12.5 mg Oral BID WC   cloNIDine  0.2 mg Oral QHS   Followed by   Melene Muller ON 03/20/2023] cloNIDine  0.1 mg Oral QHS   empagliflozin  10 mg Oral Daily   insulin aspart  0-9 Units Subcutaneous TID WC   irbesartan  75 mg Oral Daily   pantoprazole  40 mg Oral Daily   rosuvastatin  40 mg Oral Daily   sodium chloride flush  3 mL Intravenous Q12H   Continuous Infusions:  sodium chloride 10 mL/hr at 03/18/23 0104   sodium chloride     sodium chloride     PRN Meds: sodium chloride, acetaminophen, nitroGLYCERIN, ondansetron (ZOFRAN) IV, mouth rinse, sodium chloride flush   Vital Signs    Vitals:   03/19/23 0755 03/19/23 0924 03/19/23 0941 03/19/23 0953  BP: 99/89 (!) 83/46 115/65 111/66  Pulse:      Resp:      Temp: 98 F (36.7 C)     TempSrc: Oral     SpO2:      Weight:      Height:        Intake/Output Summary (Last 24 hours) at 03/19/2023 1025 Last data filed at 03/19/2023 0620 Gross per 24 hour  Intake --  Output 850 ml  Net -850 ml       03/17/2023   12:28 AM 03/16/2023    8:17 PM 03/15/2023    2:05 PM  Last 3 Weights  Weight (lbs) 138 lb 0.1 oz 137 lb 136 lb 12.8 oz  Weight (kg) 62.6 kg 62.143 kg 62.052 kg      Telemetry    Sinus Tachycardia, deep TWI - Personally Reviewed  ECG    No new tracing this morning  Physical Exam   GEN: No acute distress. In a posey belt. Neck: No JVD Cardiac: RRR, no murmurs, rubs, or gallops.  Respiratory: Clear to auscultation bilaterally. GI: Soft, nontender, non-distended  MS: No edema; No deformity. Neuro:   Nonfocal  Psych: confused, alert to self   Labs    High Sensitivity Troponin:   Recent Labs  Lab 03/16/23 2032 03/16/23 2223  TROPONINIHS 2,557* 3,099*      Chemistry Recent Labs  Lab 03/16/23 2032 03/17/23 0132 03/17/23 0849 03/18/23 0945 03/19/23 0655  NA 134*   < > 135 128* 137  K 3.6   < > 3.8 3.5 4.7  CL 100   < > 103 94* 101  CO2 24   < > 21* 21* 23  GLUCOSE 153*   < > 156* 189* 126*  BUN 15   < > 11 13 16   CREATININE 1.26*   < > 1.19* 1.42* 2.09*  CALCIUM 9.6   < > 9.4 9.3 9.1  MG  --   --   --   --  1.5*  PROT 7.7  --   --   --   --   ALBUMIN 4.5  --   --   --   --   AST 29  --   --   --   --  ALT 18  --   --   --   --   ALKPHOS 85  --   --   --   --   BILITOT 1.0  --   --   --   --   GFRNONAA 44*   < > 47* 38* 24*  ANIONGAP 10   < > 11 13 13    < > = values in this interval not displayed.     Lipids  Recent Labs  Lab 03/16/23 2032  CHOL 207*  TRIG 75  HDL 107  LDLCALC 85  CHOLHDL 1.9     Hematology Recent Labs  Lab 03/17/23 0849 03/18/23 0945 03/19/23 0655  WBC 5.6 9.1 5.1  RBC 4.06 4.28 3.56*  HGB 12.5 13.2 11.1*  HCT 37.1 39.3 33.1*  MCV 91.4 91.8 93.0  MCH 30.8 30.8 31.2  MCHC 33.7 33.6 33.5  RDW 12.5 12.7 12.8  PLT 195 205 166    Thyroid No results for input(s): "TSH", "FREET4" in the last 168 hours.  BNPNo results for input(s): "BNP", "PROBNP" in the last 168 hours.  DDimer No results for input(s): "DDIMER" in the last 168 hours.   Radiology   DG Thoracic Spine 2 View  Result Date: 03/16/2023 CLINICAL DATA:  Chest pain upper back pain EXAM: THORACIC SPINE 2 VIEWS COMPARISON:  None Available. FINDINGS: Scoliosis of the thoracolumbar spine. Vertebral body heights are grossly maintained. Multilevel degenerative osteophytes. IMPRESSION: Scoliosis and degenerative changes. Electronically Signed   By: Jasmine Pang M.D.   On: 03/16/2023 21:23   DG Chest 2 View  Result Date: 03/16/2023 CLINICAL DATA:  Chest pain EXAM: CHEST - 2  VIEW COMPARISON:  01/06/2023 FINDINGS: The heart size and mediastinal contours are within normal limits. Both lungs are clear. Scoliosis. Aortic atherosclerosis. Minimal scarring left lung base. IMPRESSION: No active cardiopulmonary disease. Electronically Signed   By: Jasmine Pang M.D.   On: 03/16/2023 21:22    Cardiac Studies   Cath: 03/16/2023  Angiographically normal coronary artery disease with no obvious culprit lesion. Codominant system. Difficult to image left ventriculography, would recommend 2D echo but appears to be normal EF. Normal EDP.    Plan: Will need to consider different etiology for troponin elevation however for now we will go ahead and treat as though this could have been ACS with a lesion that cleared with nitroglycerin, aspirin and heparin.. For now we will treat with DAPT and 40 hours of IV heparin until other etiology is determined. Check 2D echo to better assess EF and consider cardiac MRI as there is concern for troponin elevation and her discomfort being related to myocarditis. Will continue most home medications but hold the thiazide diuretic and clonidine nightly. Sliding-scale insulin. Standard labs checked with A1c, lipid panel etc.    Anticipate relatively short stay depending additional evaluation.  Echo: 03/17/2023: LV EF ~25-30%. Mid-Apical Segmental Akinesis in a pattern c/w Takotsubo (Stress-Induced). Gr 1 DD. Normal RV & RVSP.  Severe LAD. Trivial MR.  Mild AoV thickening & normal RAP  Patient Profile     77 y.o. female with history of hypertension, CKD, T2DM, hyperlipidemia who presented with elevated troponin and ST elevations on EKG.  Taken to [facility], showed normal coronary arteries  Assessment & Plan    Troponin elevation: Up to 3000, ST elevations on EKG.  Cath showed normal coronary arteries.  Echocardiogram shows wall motion abnormality concerning for Takotsubo cardiomyopathy -Plan for cardiac MRI for further evaluation, pending  improvement in renal  function  Suspected Takotsubo cardiomyopathy: Echo shows EF 25 to 30% with wall motion abnormality suggesting Takotsubo cardiomyopathy.  Troponins up to 3000. -Started on GDMT with carvedilol 12.5 mg twice daily, Avapro 75 mg daily, Jardiance 10 mg daily.  Hypotensive to 80/40s this morning.  BP meds held.  Will slowly add back GDMT  AKI: Worsening renal function (Cr 1.42 >2.09).  Hold avapro and jardiance.    Hypertension: Weaning clonidine and stopped home thiazide diuretic and amlodipine to add GDMT as above  Hyperlipidemia: On atorvastatin 40 mg daily  Delirium: Required dose of Haldol 5/30.  Work to Re-direct & minimize distractions. Open windows during daytime to allow for re-orientation.   Improved today  -Diet heart healthy -DVT PPx: SQ heparin -Code: Full

## 2023-03-19 NOTE — Progress Notes (Signed)
Spoke to Alicia (daughter) regarding BP meds modification. She wanted reassurance regarding the Clonidine. BP on soft side the whole day. Clonidine due at 2200H. Consulted on call cardiologist Dr.Khan with order to hold due Clonidine tonight. Education given to [place] and verbalized understanding.

## 2023-03-19 NOTE — Progress Notes (Addendum)
SBP 90's mmHg, MAP >65, HR 60's bpm. Denied chest pain. Resumed Clonidine last night per order. Alert and oriented x 3. Calm and cooperative.

## 2023-03-19 NOTE — Progress Notes (Signed)
Patient's son Jake Shark requested for 4 side rails up. Educated that it is a form of restrain. He verbalized understanding.

## 2023-03-19 NOTE — Plan of Care (Signed)
  Problem: Education: Goal: Knowledge of General Education information will improve Description: Including pain rating scale, medication(s)/side effects and non-pharmacologic comfort measures Outcome: Progressing   Problem: Health Behavior/Discharge Planning: Goal: Ability to manage health-related needs will improve Outcome: Progressing   Problem: Clinical Measurements: Goal: Cardiovascular complication will be avoided Outcome: Progressing   Problem: Activity: Goal: Risk for activity intolerance will decrease Outcome: Progressing   Problem: Coping: Goal: Level of anxiety will decrease Outcome: Progressing   Problem: Pain Managment: Goal: General experience of comfort will improve Outcome: Progressing   Problem: Safety: Goal: Ability to remain free from injury will improve Outcome: Progressing   Problem: Skin Integrity: Goal: Risk for impaired skin integrity will decrease Outcome: Progressing   

## 2023-03-19 NOTE — Plan of Care (Signed)

## 2023-03-19 NOTE — Progress Notes (Signed)
1610- BP was 83/46 with a map of 57, This RN notified Dr. Bjorn Pippin in person and he told this RN to hold off on BP medications for the morning and would be in to see the patient soon. Will continue to monitor blood pressure. Patient denying any dizziness or lightheadedness at this time.

## 2023-03-20 ENCOUNTER — Inpatient Hospital Stay (HOSPITAL_COMMUNITY): Payer: Medicare HMO

## 2023-03-20 DIAGNOSIS — I5041 Acute combined systolic (congestive) and diastolic (congestive) heart failure: Secondary | ICD-10-CM

## 2023-03-20 DIAGNOSIS — I214 Non-ST elevation (NSTEMI) myocardial infarction: Secondary | ICD-10-CM | POA: Diagnosis not present

## 2023-03-20 DIAGNOSIS — N179 Acute kidney failure, unspecified: Secondary | ICD-10-CM | POA: Diagnosis not present

## 2023-03-20 LAB — CBC
HCT: 34.7 % — ABNORMAL LOW (ref 36.0–46.0)
Hemoglobin: 11.7 g/dL — ABNORMAL LOW (ref 12.0–15.0)
MCH: 31.8 pg (ref 26.0–34.0)
MCHC: 33.7 g/dL (ref 30.0–36.0)
MCV: 94.3 fL (ref 80.0–100.0)
Platelets: 169 10*3/uL (ref 150–400)
RBC: 3.68 MIL/uL — ABNORMAL LOW (ref 3.87–5.11)
RDW: 12.6 % (ref 11.5–15.5)
WBC: 5.9 10*3/uL (ref 4.0–10.5)
nRBC: 0 % (ref 0.0–0.2)

## 2023-03-20 LAB — MAGNESIUM: Magnesium: 2.1 mg/dL (ref 1.7–2.4)

## 2023-03-20 LAB — BASIC METABOLIC PANEL
Anion gap: 9 (ref 5–15)
BUN: 20 mg/dL (ref 8–23)
CO2: 25 mmol/L (ref 22–32)
Calcium: 8.9 mg/dL (ref 8.9–10.3)
Chloride: 96 mmol/L — ABNORMAL LOW (ref 98–111)
Creatinine, Ser: 1.58 mg/dL — ABNORMAL HIGH (ref 0.44–1.00)
GFR, Estimated: 34 mL/min — ABNORMAL LOW (ref >60)
Glucose, Bld: 116 mg/dL — ABNORMAL HIGH (ref 70–99)
Potassium: 4 mmol/L (ref 3.5–5.1)
Sodium: 130 mmol/L — ABNORMAL LOW (ref 135–145)

## 2023-03-20 LAB — GLUCOSE, CAPILLARY
Glucose-Capillary: 149 mg/dL — ABNORMAL HIGH (ref 70–99)
Glucose-Capillary: 149 mg/dL — ABNORMAL HIGH (ref 70–99)
Glucose-Capillary: 186 mg/dL — ABNORMAL HIGH (ref 70–99)
Glucose-Capillary: 164 mg/dL — ABNORMAL HIGH (ref 70–99)

## 2023-03-20 MED ORDER — GADOBUTROL 1 MMOL/ML IV SOLN
9.0000 mL | Freq: Once | INTRAVENOUS | Status: AC | PRN
  Administered 2023-03-20: 9 mL via INTRAVENOUS

## 2023-03-20 MED ORDER — APIXABAN 5 MG PO TABS
5.0000 mg | ORAL_TABLET | Freq: Two times a day (BID) | ORAL | Status: DC
  Administered 2023-03-20 – 2023-03-22 (×4): 5 mg via ORAL
  Filled 2023-03-20 (×4): qty 1

## 2023-03-20 NOTE — Progress Notes (Addendum)
Rounding Note    Patient Name: Mikayla Jenkins Date of Encounter: 03/20/2023  [facility] Cardiologist: Marjo Bicker, MD   Subjective   BP improved to 130/68 this morning.  Renal function improved (Cr 2.09>1.58).  Denies any chest pain or dyspnea  Inpatient Medications    Scheduled Meds:  aspirin EC  81 mg Oral Daily   calcium-vitamin D  1 tablet Oral Daily   carvedilol  3.125 mg Oral BID WC   cloNIDine  0.2 mg Oral QHS   Followed by   cloNIDine  0.1 mg Oral QHS   heparin  5,000 Units Subcutaneous Q8H   insulin aspart  0-9 Units Subcutaneous TID WC   pantoprazole  40 mg Oral Daily   rosuvastatin  40 mg Oral Daily   sodium chloride flush  3 mL Intravenous Q12H   Continuous Infusions:  sodium chloride 10 mL/hr at 03/18/23 0104   sodium chloride     sodium chloride     PRN Meds: sodium chloride, acetaminophen, nitroGLYCERIN, ondansetron (ZOFRAN) IV, mouth rinse, sodium chloride flush   Vital Signs    Vitals:   03/20/23 0200 03/20/23 0400 03/20/23 0558 03/20/23 0745  BP: 120/67 (!) 100/52 118/69 130/68  Pulse: 71  62 65  Resp: 11  16 18   Temp:   98.1 F (36.7 C) 98.5 F (36.9 C)  TempSrc:   Oral Oral  SpO2:   99% 100%  Weight:      Height:        Intake/Output Summary (Last 24 hours) at 03/20/2023 0938 Last data filed at 03/20/2023 0745 Gross per 24 hour  Intake 48.06 ml  Output 2800 ml  Net -2751.94 ml       03/17/2023   12:28 AM 03/16/2023    8:17 PM 03/15/2023    2:05 PM  Last 3 Weights  Weight (lbs) 138 lb 0.1 oz 137 lb 136 lb 12.8 oz  Weight (kg) 62.6 kg 62.143 kg 62.052 kg      Telemetry    Sinus Tachycardia, deep TWI - Personally Reviewed  ECG    No new tracing this morning  Physical Exam   GEN: No acute distress. In a posey belt. Neck: No JVD Cardiac: RRR, no murmurs, rubs, or gallops.  Respiratory: Clear to auscultation bilaterally. GI: Soft, nontender, non-distended  MS: No edema; No deformity. Neuro:  Nonfocal   Psych: confused, alert to self   Labs    High Sensitivity Troponin:   Recent Labs  Lab 03/16/23 2032 03/16/23 2223  TROPONINIHS 2,557* 3,099*      Chemistry Recent Labs  Lab 03/16/23 2032 03/17/23 0132 03/18/23 0945 03/19/23 0655 03/20/23 0218  NA 134*   < > 128* 137 130*  K 3.6   < > 3.5 4.7 4.0  CL 100   < > 94* 101 96*  CO2 24   < > 21* 23 25  GLUCOSE 153*   < > 189* 126* 116*  BUN 15   < > 13 16 20   CREATININE 1.26*   < > 1.42* 2.09* 1.58*  CALCIUM 9.6   < > 9.3 9.1 8.9  MG  --   --   --  1.5* 2.1  PROT 7.7  --   --   --   --   ALBUMIN 4.5  --   --   --   --   AST 29  --   --   --   --   ALT 18  --   --   --   --  ALKPHOS 85  --   --   --   --   BILITOT 1.0  --   --   --   --   GFRNONAA 44*   < > 38* 24* 34*  ANIONGAP 10   < > 13 13 9    < > = values in this interval not displayed.     Lipids  Recent Labs  Lab 03/16/23 2032  CHOL 207*  TRIG 75  HDL 107  LDLCALC 85  CHOLHDL 1.9     Hematology Recent Labs  Lab 03/18/23 0945 03/19/23 0655 03/20/23 0218  WBC 9.1 5.1 5.9  RBC 4.28 3.56* 3.68*  HGB 13.2 11.1* 11.7*  HCT 39.3 33.1* 34.7*  MCV 91.8 93.0 94.3  MCH 30.8 31.2 31.8  MCHC 33.6 33.5 33.7  RDW 12.7 12.8 12.6  PLT 205 166 169    Thyroid No results for input(s): "TSH", "FREET4" in the last 168 hours.  BNPNo results for input(s): "BNP", "PROBNP" in the last 168 hours.  DDimer No results for input(s): "DDIMER" in the last 168 hours.   Radiology   DG Thoracic Spine 2 View  Result Date: 03/16/2023 CLINICAL DATA:  Chest pain upper back pain EXAM: THORACIC SPINE 2 VIEWS COMPARISON:  None Available. FINDINGS: Scoliosis of the thoracolumbar spine. Vertebral body heights are grossly maintained. Multilevel degenerative osteophytes. IMPRESSION: Scoliosis and degenerative changes. Electronically Signed   By: Jasmine Pang M.D.   On: 03/16/2023 21:23   DG Chest 2 View  Result Date: 03/16/2023 CLINICAL DATA:  Chest pain EXAM: CHEST - 2 VIEW  COMPARISON:  01/06/2023 FINDINGS: The heart size and mediastinal contours are within normal limits. Both lungs are clear. Scoliosis. Aortic atherosclerosis. Minimal scarring left lung base. IMPRESSION: No active cardiopulmonary disease. Electronically Signed   By: Jasmine Pang M.D.   On: 03/16/2023 21:22    Cardiac Studies   Cath: 03/16/2023  Angiographically normal coronary artery disease with no obvious culprit lesion. Codominant system. Difficult to image left ventriculography, would recommend 2D echo but appears to be normal EF. Normal EDP.    Plan: Will need to consider different etiology for troponin elevation however for now we will go ahead and treat as though this could have been ACS with a lesion that cleared with nitroglycerin, aspirin and heparin.. For now we will treat with DAPT and 40 hours of IV heparin until other etiology is determined. Check 2D echo to better assess EF and consider cardiac MRI as there is concern for troponin elevation and her discomfort being related to myocarditis. Will continue most home medications but hold the thiazide diuretic and clonidine nightly. Sliding-scale insulin. Standard labs checked with A1c, lipid panel etc.    Anticipate relatively short stay depending additional evaluation.  Echo: 03/17/2023: LV EF ~25-30%. Mid-Apical Segmental Akinesis in a pattern c/w Takotsubo (Stress-Induced). Gr 1 DD. Normal RV & RVSP.  Severe LAD. Trivial MR.  Mild AoV thickening & normal RAP  Patient Profile     77 y.o. female with history of hypertension, CKD, T2DM, hyperlipidemia who presented with elevated troponin and ST elevations on EKG.  Taken to [facility], showed normal coronary arteries  Assessment & Plan    Troponin elevation: Up to 3000, ST elevations on EKG.  Cath showed normal coronary arteries.  Echocardiogram shows wall motion abnormality concerning for Takotsubo cardiomyopathy -Plan for cardiac MRI for further evaluation  ADDENDUM: Cardiac  MRI shows findings consistent with Takotsubo cardiomyopathy.  LVEF 41%.  There is an LV apical  thrombus.  Will start on Eliquis.  Recommend echo with contrast in 1 month to evaluate for improvement in LV systolic function and resolution of apical thrombus  Suspected Takotsubo cardiomyopathy: Echo shows EF 25 to 30% with wall motion abnormality suggesting Takotsubo cardiomyopathy.  Troponins up to 3000. -Started on GDMT with carvedilol 12.5 mg twice daily, Avapro 75 mg daily, Jardiance 10 mg daily.  Hypotensive to 80/40s on 6/1.   BP meds held.  Will slowly add back GDMT.  BP improved today, will add coreg 3.125 mg BID today  AKI: Worsening renal function (Cr 1.42 >2.09) on 6/1.  Held avapro and [place].  Cr improved to 1.58 today  Hypertension: Weaning clonidine and stopped home thiazide diuretic and amlodipine to add GDMT as above  Hyperlipidemia: On atorvastatin 40 mg daily  Delirium: Required dose of Haldol 5/30.  Work to Re-direct & minimize distractions. Open windows during daytime to allow for re-orientation.   Improved  -Diet heart healthy -DVT PPx: SQ heparin -Code: Full  Little Ishikawa, MD

## 2023-03-20 NOTE — Progress Notes (Signed)
ANTICOAGULATION CONSULT NOTE - Initial Consult  Pharmacy Consult for apixaban Indication:  LV Thrombus  Allergies  Allergen Reactions   Metformin And Related Other (See Comments)    CKD stage 4    Patient Measurements: Height: 5\' 5"  (165.1 cm) Weight: 62.6 kg (138 lb 0.1 oz) IBW/kg (Calculated) : 57  Vital Signs: Temp: 98.5 F (36.9 C) (06/02 0745) Temp Source: Oral (06/02 0745) BP: 126/68 (06/02 1628) Pulse Rate: 65 (06/02 0745)  Labs: Recent Labs    03/18/23 0945 03/19/23 0655 03/20/23 0218  HGB 13.2 11.1* 11.7*  HCT 39.3 33.1* 34.7*  PLT 205 166 169  CREATININE 1.42* 2.09* 1.58*    Estimated Creatinine Clearance: 26.8 mL/min (A) (by C-G formula based on SCr of 1.58 mg/dL (H)).   Medical History: Past Medical History:  Diagnosis Date   Diabetes mellitus type II    Heart murmur    Hyperlipidemia    Hypertension    Renal disorder    Sleep apnea       Assessment: 63 yoF admitted with CHF noted to have LV thrombus on cardiac MRI. Pharmacy to dose apixaban.   Plan:  Apixaban 5mg  BID Pharmacy will sign off  Fredonia Highland, PharmD, [place], [facility] Clinical Pharmacist [phone] Please check AMION for all [facility] Pharmacy numbers 03/20/2023

## 2023-03-20 NOTE — Plan of Care (Signed)

## 2023-03-21 ENCOUNTER — Other Ambulatory Visit (HOSPITAL_COMMUNITY): Payer: Self-pay

## 2023-03-21 DIAGNOSIS — I214 Non-ST elevation (NSTEMI) myocardial infarction: Secondary | ICD-10-CM | POA: Diagnosis not present

## 2023-03-21 DIAGNOSIS — I5181 Takotsubo syndrome: Secondary | ICD-10-CM | POA: Diagnosis not present

## 2023-03-21 DIAGNOSIS — I5041 Acute combined systolic (congestive) and diastolic (congestive) heart failure: Secondary | ICD-10-CM | POA: Diagnosis not present

## 2023-03-21 DIAGNOSIS — I513 Intracardiac thrombosis, not elsewhere classified: Secondary | ICD-10-CM

## 2023-03-21 DIAGNOSIS — N179 Acute kidney failure, unspecified: Secondary | ICD-10-CM | POA: Diagnosis not present

## 2023-03-21 LAB — GLUCOSE, CAPILLARY
Glucose-Capillary: 179 mg/dL — ABNORMAL HIGH (ref 70–99)
Glucose-Capillary: 105 mg/dL — ABNORMAL HIGH (ref 70–99)
Glucose-Capillary: 181 mg/dL — ABNORMAL HIGH (ref 70–99)
Glucose-Capillary: 177 mg/dL — ABNORMAL HIGH (ref 70–99)

## 2023-03-21 LAB — CBC
HCT: 35.9 % — ABNORMAL LOW (ref 36.0–46.0)
Hemoglobin: 12.1 g/dL (ref 12.0–15.0)
MCH: 31 pg (ref 26.0–34.0)
MCHC: 33.7 g/dL (ref 30.0–36.0)
MCV: 92.1 fL (ref 80.0–100.0)
Platelets: 183 10*3/uL (ref 150–400)
RBC: 3.9 MIL/uL (ref 3.87–5.11)
RDW: 12.6 % (ref 11.5–15.5)
WBC: 5.6 10*3/uL (ref 4.0–10.5)
nRBC: 0 % (ref 0.0–0.2)

## 2023-03-21 LAB — BASIC METABOLIC PANEL
Anion gap: 11 (ref 5–15)
BUN: 18 mg/dL (ref 8–23)
CO2: 24 mmol/L (ref 22–32)
Calcium: 9.1 mg/dL (ref 8.9–10.3)
Chloride: 99 mmol/L (ref 98–111)
Creatinine, Ser: 1.42 mg/dL — ABNORMAL HIGH (ref 0.44–1.00)
GFR, Estimated: 38 mL/min — ABNORMAL LOW (ref >60)
Glucose, Bld: 130 mg/dL — ABNORMAL HIGH (ref 70–99)
Potassium: 4 mmol/L (ref 3.5–5.1)
Sodium: 134 mmol/L — ABNORMAL LOW (ref 135–145)

## 2023-03-21 NOTE — Evaluation (Signed)
Physical Therapy Evaluation Patient Details Name: Mikayla Jenkins MRN: [id number] DOB: November 12, 1945 Today's Date: 03/21/2023  History of Present Illness  Mikayla Jenkins is a 77 y.o. female who came to ED with chest pain on 03/17/2023 for the evaluation of STEMI. PMH: HTN, T2DM, HLD, CKD   Clinical Impression  Pt admitted with above. Pt mobilizing well however pt confused with decreased insight to deficits and safety. Dtr reports confusion started when she moved from [facility] to 6e. Acute PT to cont to follow to monitor cognition and prevent deconditioning from hospital stay.       Recommendations for follow up therapy are one component of a multi-disciplinary discharge planning process, led by the attending physician.  Recommendations may be updated based on patient status, additional functional criteria and insurance authorization.  Follow Up Recommendations       Assistance Recommended at Discharge Frequent or constant Supervision/Assistance (due to cognitive impairements)  Patient can return home with the following  A little help with walking and/or transfers;A little help with bathing/dressing/bathroom;Assist for transportation;Help with stairs or ramp for entrance    Equipment Recommendations None recommended by PT  Recommendations for Other Services       Functional Status Assessment Patient has had a recent decline in their functional status and demonstrates the ability to make significant improvements in function in a reasonable and predictable amount of time.     Precautions / Restrictions Precautions Precautions: Fall Precaution Comments: noted confusion since admission Restrictions Weight Bearing Restrictions: No      Mobility  Bed Mobility Overal bed mobility: Needs Assistance Bed Mobility: Supine to Sit, Sit to Supine     Supine to sit: Supervision Sit to supine: Supervision   General bed mobility comments: increased time, HOB all the way elevated    Transfers Overall  transfer level: Needs assistance Equipment used: None Transfers: Sit to/from Stand Sit to Stand: Min guard           General transfer comment: min guard for safety due to first time up, no physical assist needed    Ambulation/Gait Ambulation/Gait assistance: Min guard Gait Distance (Feet): 175 Feet Assistive device: None Gait Pattern/deviations: Step-through pattern, Decreased stride length, Narrow base of support Gait velocity: wfl for age Gait velocity interpretation: 1.31 - 2.62 ft/sec, indicative of limited community ambulator   General Gait Details: pt without LOB despite leg length discrepency, VSS, HR up to 108bpm, SpO2 at 97%  Stairs            Wheelchair Mobility    Modified Rankin (Stroke Patients Only)       Balance Overall balance assessment: Mild deficits observed, not formally tested (pt able to stand and place maxipad in mesh underwear)                                           Pertinent Vitals/Pain Pain Assessment Pain Assessment: No/denies pain    Home Living Family/patient expects to be discharged to:: Private residence Living Arrangements: Spouse/significant other Available Help at Discharge: Family;Available 24 hours/day Type of Home: House Home Access: Level entry       Home Layout: One level Home Equipment: Agricultural consultant (2 wheels);Shower seat      Prior Function Prior Level of Function : Independent/Modified Independent             Mobility Comments: indep, no AD, drove ADLs Comments:  indep     Hand Dominance   Dominant Hand: Right    Extremity/Trunk Assessment   Upper Extremity Assessment Upper Extremity Assessment: Overall WFL for tasks assessed    Lower Extremity Assessment Lower Extremity Assessment: Overall WFL for tasks assessed (pt with leg length discrepency and wears heal lift in R shoe)    Cervical / Trunk Assessment Cervical / Trunk Assessment: Normal  Communication    Communication: No difficulties (soft spoken)  Cognition Arousal/Alertness: Awake/alert Behavior During Therapy: WFL for tasks assessed/performed Overall Cognitive Status: Impaired/Different from baseline                                 General Comments: pt confused, unable to state date or recall after being re-oriented. Pt's dtr reports she became confused once they switched rooms from [facility] to 6e.pt with decreased safety awareness, typically pt is A&Ox4        General Comments General comments (skin integrity, edema, etc.): VSS    Exercises     Assessment/Plan    PT Assessment Patient needs continued PT services  PT Problem List Decreased strength;Decreased range of motion;Decreased activity tolerance;Decreased balance;Decreased mobility;Decreased safety awareness;Decreased cognition       PT Treatment Interventions DME instruction;Gait training;Stair training;Functional mobility training;Therapeutic activities;Therapeutic exercise;Balance training;Cognitive remediation    PT Goals (Current goals can be found in the Care Plan section)  Acute Rehab PT Goals Patient Stated Goal: home PT Goal Formulation: With patient/family Time For Goal Achievement: 04/04/23 Potential to Achieve Goals: Good Additional Goals Additional Goal #1: Pt to score >19 on DGI to indicate minimal falls risk.    Frequency Min 1X/week     Co-evaluation               AM-PAC PT "6 Clicks" Mobility  Outcome Measure Help needed turning from your back to your side while in a flat bed without using bedrails?: None Help needed moving from lying on your back to sitting on the side of a flat bed without using bedrails?: None Help needed moving to and from a bed to a chair (including a wheelchair)?: None Help needed standing up from a chair using your arms (e.g., wheelchair or bedside chair)?: A Little Help needed to walk in hospital room?: A Little Help needed climbing 3-5 steps with a  railing? : A Little 6 Click Score: 21    End of Session Equipment Utilized During Treatment: Gait belt Activity Tolerance: Patient tolerated treatment well Patient left: in bed;with bed alarm set;with family/visitor present;with call bell/phone within reach Nurse Communication: Mobility status PT Visit Diagnosis: Unsteadiness on feet (R26.81);Muscle weakness (generalized) (M62.81)    Time: 6387-5643 PT Time Calculation (min) (ACUTE ONLY): 25 min   Charges:   PT Evaluation $PT Eval Moderate Complexity: 1 Mod PT Treatments $Gait Training: 8-22 mins        Lewis Shock, PT, DPT Acute Rehabilitation Services Secure chat preferred Office #: [phone]   Iona Hansen 03/21/2023, 1:39 PM

## 2023-03-21 NOTE — Progress Notes (Addendum)
Rounding Note    Patient Name: Mikayla Jenkins Date of Encounter: 03/21/2023  [facility] Cardiologist: Marjo Bicker, MD   Subjective   No chest pain this morning. Family at the bedside.   Inpatient Medications    Scheduled Meds:  apixaban  5 mg Oral BID   calcium-vitamin D  1 tablet Oral Daily   carvedilol  3.125 mg Oral BID WC   insulin aspart  0-9 Units Subcutaneous TID WC   pantoprazole  40 mg Oral Daily   rosuvastatin  40 mg Oral Daily   sodium chloride flush  3 mL Intravenous Q12H   Continuous Infusions:  sodium chloride 10 mL/hr at 03/18/23 0104   sodium chloride     sodium chloride     PRN Meds: sodium chloride, acetaminophen, nitroGLYCERIN, ondansetron (ZOFRAN) IV, mouth rinse, sodium chloride flush   Vital Signs    Vitals:   03/20/23 1628 03/20/23 2046 03/20/23 2110 03/21/23 0445  BP: 126/68 129/67 121/64 126/73  Pulse:  72  81  Resp:  13  16  Temp:  98.2 F (36.8 C)  97.9 F (36.6 C)  TempSrc:  Oral  Oral  SpO2:  97%  100%  Weight:      Height:        Intake/Output Summary (Last 24 hours) at 03/21/2023 0750 Last data filed at 03/21/2023 0636 Gross per 24 hour  Intake --  Output 1050 ml  Net -1050 ml      03/17/2023   12:28 AM 03/16/2023    8:17 PM 03/15/2023    2:05 PM  Last 3 Weights  Weight (lbs) 138 lb 0.1 oz 137 lb 136 lb 12.8 oz  Weight (kg) 62.6 kg 62.143 kg 62.052 kg      Telemetry    Sinus Rhythm, brief episode of ST - Personally Reviewed  ECG    No new tracing this morning  Physical Exam   GEN: No acute distress.   Neck: No JVD Cardiac: RRR, no murmurs, rubs, or gallops.  Respiratory: Clear to auscultation bilaterally. GI: Soft, nontender, non-distended  MS: No edema; No deformity. Neuro:  Nonfocal  Psych: Normal affect   Labs    High Sensitivity Troponin:   Recent Labs  Lab 03/16/23 2032 03/16/23 2223  TROPONINIHS 2,557* 3,099*     Chemistry Recent Labs  Lab 03/16/23 2032 03/17/23 0132  03/19/23 0655 03/20/23 0218 03/21/23 0316  NA 134*   < > 137 130* 134*  K 3.6   < > 4.7 4.0 4.0  CL 100   < > 101 96* 99  CO2 24   < > 23 25 24   GLUCOSE 153*   < > 126* 116* 130*  BUN 15   < > 16 20 18   CREATININE 1.26*   < > 2.09* 1.58* 1.42*  CALCIUM 9.6   < > 9.1 8.9 9.1  MG  --   --  1.5* 2.1  --   PROT 7.7  --   --   --   --   ALBUMIN 4.5  --   --   --   --   AST 29  --   --   --   --   ALT 18  --   --   --   --   ALKPHOS 85  --   --   --   --   BILITOT 1.0  --   --   --   --   [facility]  44*   < > 24* 34* 38*  ANIONGAP 10   < > 13 9 11    < > = values in this interval not displayed.    Lipids  Recent Labs  Lab 03/16/23 2032  CHOL 207*  TRIG 75  HDL 107  LDLCALC 85  CHOLHDL 1.9    Hematology Recent Labs  Lab 03/19/23 0655 03/20/23 0218 03/21/23 0316  WBC 5.1 5.9 5.6  RBC 3.56* 3.68* 3.90  HGB 11.1* 11.7* 12.1  HCT 33.1* 34.7* 35.9*  MCV 93.0 94.3 92.1  MCH 31.2 31.8 31.0  MCHC 33.5 33.7 33.7  RDW 12.8 12.6 12.6  PLT 166 169 183   Thyroid No results for input(s): "TSH", "FREET4" in the last 168 hours.  BNPNo results for input(s): "BNP", "PROBNP" in the last 168 hours.  DDimer No results for input(s): "DDIMER" in the last 168 hours.   Radiology    MR CARDIAC MORPHOLOGY W WO CONTRAST  Result Date: 03/20/2023 CLINICAL DATA:  77F presents with MINOCA EXAM: CARDIAC MRI TECHNIQUE: The patient was scanned on a 1.5 Tesla Siemens magnet. A dedicated cardiac coil was used. Functional imaging was done using Fiesta sequences. 2,3, and 4 chamber views were done to assess for RWMA's. Modified Simpson's rule using a short axis stack was used to calculate an ejection fraction on a dedicated work [employer], [employer]. The patient received 9 cc of Gadavist. After 10 minutes inversion recovery sequences were used to assess for infiltration and scar tissue. Phase contrast velocity mapping was performed above the aortic and pulmonic valves CONTRAST:  9 cc  of Gadavist  FINDINGS: Left ventricle: -Normal size -Mild hypertrophy -Mild systolic dysfunction.  Apical akinesis -LV apical thrombus measures 12mm x 10mm x 6mm -Elevated ECV (30%) -Elevated T2 values in apical segments, measuring up to 63ms in apical septum -No LGE LV EF:  41% (Normal 52-79%) Absolute volumes: LV EDV: (Normal 78-167 mL) LV ESV: 61mL (Normal 21-64 mL) LV SV: 42mL (Normal 52-114 mL) CO: 2.9L/min (Normal 2.7-6.3 L/min) Indexed volumes: LV EDV: 66mL/sq-m (Normal 50-96 mL/sq-m) LV ESV: 63mL/sq-m (Normal 10-40 mL/sq-m) LV SV: 32mL/sq-m (Normal 33-64 mL/sq-m) CI: 1.7L/min/sq-m (Normal 1.9-3.9 L/min/sq-m) Right ventricle: Normal size with mild systolic dysfunction. RV EF: 46% (Normal 52-80%) Absolute volumes: RV EDV: 94mL (Normal 79-175 mL) RV ESV: 51mL (Normal 13-75 mL) RV SV: 44mL (Normal 56-110 mL) CO: 3.0L/min (Normal 2.7-6 L/min) Indexed volumes: RV EDV: 36mL/sq-m (Normal 51-97 mL/sq-m) RV ESV: 31mL/sq-m (Normal 9-42 mL/sq-m) RV SV: 38mL/sq-m (Normal 35-61 mL/sq-m) CI: 1.8L/min/sq-m (Normal 1.8-3.8 L/min/sq-m) Left atrium: Normal size Right atrium: Normal size Mitral valve: Trivial regurgitation Aortic valve: Tricuspid.  Trivial regurgitation Tricuspid valve: Trivial regurgitation Pulmonic valve: Trivial regurgitation Aorta: Normal proximal ascending aorta Pericardium: Small effusion IMPRESSION: 1. Findings consistent with Takotsubo cardiomyopathy, including akinesis of LV apical segments, no late gadolinium enhancement, and elevated T2 values suggesting myocardial edema in apical segments. 2.  LV apical thrombus measures 12mm x 10mm x 6mm 3. Normal LV size, mild hypertrophy, and mild systolic dysfunction (EF 41%) 4.  Normal RV size with mild systolic dysfunction (EF 46%) Electronically Signed   By: Epifanio Lesches M.D.   On: 03/20/2023 22:30   MR CARDIAC VELOCITY FLOW MAP  Result Date: 03/20/2023 CLINICAL DATA:  77F presents with MINOCA EXAM: CARDIAC MRI TECHNIQUE: The patient was scanned on a 1.5  Tesla Siemens magnet. A dedicated cardiac coil was used. Functional imaging was done using Fiesta sequences. 2,3, and 4 chamber views were done to  assess for RWMA's. Modified Simpson's rule using a short axis stack was used to calculate an ejection fraction on a dedicated work [employer] officer, [employer]. The patient received 9 cc of Gadavist. After 10 minutes inversion recovery sequences were used to assess for infiltration and scar tissue. Phase contrast velocity mapping was performed above the aortic and pulmonic valves CONTRAST:  9 cc  of Gadavist FINDINGS: Left ventricle: -Normal size -Mild hypertrophy -Mild systolic dysfunction.  Apical akinesis -LV apical thrombus measures 12mm x 10mm x 6mm -Elevated ECV (30%) -Elevated T2 values in apical segments, measuring up to 63ms in apical septum -No LGE LV EF:  41% (Normal 52-79%) Absolute volumes: LV EDV: (Normal 78-167 mL) LV ESV: 61mL (Normal 21-64 mL) LV SV: 42mL (Normal 52-114 mL) CO: 2.9L/min (Normal 2.7-6.3 L/min) Indexed volumes: LV EDV: 37mL/sq-m (Normal 50-96 mL/sq-m) LV ESV: 56mL/sq-m (Normal 10-40 mL/sq-m) LV SV: 78mL/sq-m (Normal 33-64 mL/sq-m) CI: 1.7L/min/sq-m (Normal 1.9-3.9 L/min/sq-m) Right ventricle: Normal size with mild systolic dysfunction. RV EF: 46% (Normal 52-80%) Absolute volumes: RV EDV: 94mL (Normal 79-175 mL) RV ESV: 51mL (Normal 13-75 mL) RV SV: 44mL (Normal 56-110 mL) CO: 3.0L/min (Normal 2.7-6 L/min) Indexed volumes: RV EDV: 20mL/sq-m (Normal 51-97 mL/sq-m) RV ESV: 65mL/sq-m (Normal 9-42 mL/sq-m) RV SV: 46mL/sq-m (Normal 35-61 mL/sq-m) CI: 1.8L/min/sq-m (Normal 1.8-3.8 L/min/sq-m) Left atrium: Normal size Right atrium: Normal size Mitral valve: Trivial regurgitation Aortic valve: Tricuspid.  Trivial regurgitation Tricuspid valve: Trivial regurgitation Pulmonic valve: Trivial regurgitation Aorta: Normal proximal ascending aorta Pericardium: Small effusion IMPRESSION: 1. Findings consistent with Takotsubo cardiomyopathy, including  akinesis of LV apical segments, no late gadolinium enhancement, and elevated T2 values suggesting myocardial edema in apical segments. 2.  LV apical thrombus measures 12mm x 10mm x 6mm 3. Normal LV size, mild hypertrophy, and mild systolic dysfunction (EF 41%) 4.  Normal RV size with mild systolic dysfunction (EF 46%) Electronically Signed   By: Epifanio Lesches M.D.   On: 03/20/2023 22:30   MR CARDIAC VELOCITY FLOW MAP  Result Date: 03/20/2023 CLINICAL DATA:  60F presents with MINOCA EXAM: CARDIAC MRI TECHNIQUE: The patient was scanned on a 1.5 Tesla Siemens magnet. A dedicated cardiac coil was used. Functional imaging was done using Fiesta sequences. 2,3, and 4 chamber views were done to assess for RWMA's. Modified Simpson's rule using a short axis stack was used to calculate an ejection fraction on a dedicated work [employer] officer, [employer]. The patient received 9 cc of Gadavist. After 10 minutes inversion recovery sequences were used to assess for infiltration and scar tissue. Phase contrast velocity mapping was performed above the aortic and pulmonic valves CONTRAST:  9 cc  of Gadavist FINDINGS: Left ventricle: -Normal size -Mild hypertrophy -Mild systolic dysfunction.  Apical akinesis -LV apical thrombus measures 12mm x 10mm x 6mm -Elevated ECV (30%) -Elevated T2 values in apical segments, measuring up to 63ms in apical septum -No LGE LV EF:  41% (Normal 52-79%) Absolute volumes: LV EDV: (Normal 78-167 mL) LV ESV: 61mL (Normal 21-64 mL) LV SV: 42mL (Normal 52-114 mL) CO: 2.9L/min (Normal 2.7-6.3 L/min) Indexed volumes: LV EDV: 68mL/sq-m (Normal 50-96 mL/sq-m) LV ESV: 69mL/sq-m (Normal 10-40 mL/sq-m) LV SV: 72mL/sq-m (Normal 33-64 mL/sq-m) CI: 1.7L/min/sq-m (Normal 1.9-3.9 L/min/sq-m) Right ventricle: Normal size with mild systolic dysfunction. RV EF: 46% (Normal 52-80%) Absolute volumes: RV EDV: 94mL (Normal 79-175 mL) RV ESV: 51mL (Normal 13-75 mL) RV SV: 44mL (Normal 56-110 mL) CO: 3.0L/min  (Normal 2.7-6 L/min) Indexed volumes: RV EDV: 83mL/sq-m (Normal 51-97 mL/sq-m) RV  ESV: 72mL/sq-m (Normal 9-42 mL/sq-m) RV SV: 26mL/sq-m (Normal 35-61 mL/sq-m) CI: 1.8L/min/sq-m (Normal 1.8-3.8 L/min/sq-m) Left atrium: Normal size Right atrium: Normal size Mitral valve: Trivial regurgitation Aortic valve: Tricuspid.  Trivial regurgitation Tricuspid valve: Trivial regurgitation Pulmonic valve: Trivial regurgitation Aorta: Normal proximal ascending aorta Pericardium: Small effusion IMPRESSION: 1. Findings consistent with Takotsubo cardiomyopathy, including akinesis of LV apical segments, no late gadolinium enhancement, and elevated T2 values suggesting myocardial edema in apical segments. 2.  LV apical thrombus measures 12mm x 10mm x 6mm 3. Normal LV size, mild hypertrophy, and mild systolic dysfunction (EF 41%) 4.  Normal RV size with mild systolic dysfunction (EF 46%) Electronically Signed   By: Epifanio Lesches M.D.   On: 03/20/2023 22:30    Cardiac Studies   Cath: 03/16/2023   Angiographically normal coronary artery disease with no obvious culprit lesion. Codominant system. Difficult to image left ventriculography, would recommend 2D echo but appears to be normal EF. Normal EDP.    Plan: Will need to consider different etiology for troponin elevation however for now we will go ahead and treat as though this could have been ACS with a lesion that cleared with nitroglycerin, aspirin and heparin.. For now we will treat with DAPT and 40 hours of IV heparin until other etiology is determined. Check 2D echo to better assess EF and consider cardiac MRI as there is concern for troponin elevation and her discomfort being related to myocarditis. Will continue most home medications but hold the thiazide diuretic and clonidine nightly. Sliding-scale insulin. Standard labs checked with A1c, lipid panel etc.     Anticipate relatively short stay depending additional evaluation.  Echo:  03/17/2023  IMPRESSIONS     1. All mid-to-apical LV segments are akinetic. Wall motion pattern most  consistent with takostubo cardiomyopathy with normal coronaries noted on  cath from 03/16/23. Left ventricular ejection fraction, by estimation, is  25 to 30%. The left ventricle has  severely decreased function. The left ventricle demonstrates regional wall  motion abnormalities (see scoring diagram/findings for description). Left  ventricular diastolic parameters are consistent with Grade I diastolic  dysfunction (impaired relaxation).   2. Right ventricular systolic function is normal. The right ventricular  size is normal. There is normal pulmonary artery systolic pressure. The  estimated right ventricular systolic pressure is 28.6 mmHg.   3. Left atrial size was severely dilated.   4. The mitral valve is normal in structure. Trivial mitral valve  regurgitation.   5. The aortic valve is tricuspid. There is mild calcification of the  aortic valve. There is mild thickening of the aortic valve. Aortic valve  regurgitation is not visualized.   6. The inferior vena cava is normal in size with greater than 50%  respiratory variability, suggesting right atrial pressure of 3 mmHg.   FINDINGS   Left Ventricle: All mid-to-apical LV segments are akinetic. Wall motion  pattern most consistent with takostubo cardiomyopathy with normal  coronaries noted on cath from 03/16/23. Left ventricular ejection fraction,  by estimation, is 25 to 30%. The left  ventricle has severely decreased function. The left ventricle demonstrates  regional wall motion abnormalities. The left ventricular internal cavity  size was normal in size. There is no left ventricular hypertrophy. Left  ventricular diastolic parameters  are consistent with Grade I diastolic dysfunction (impaired relaxation).   Right Ventricle: The right ventricular size is normal. Right ventricular  systolic function is normal. There is normal  pulmonary artery systolic  pressure. The tricuspid regurgitant velocity is  2.53 m/s, and with an  assumed right atrial pressure of 3 mmHg,   the estimated right ventricular systolic pressure is 28.6 mmHg.   Left Atrium: Left atrial size was severely dilated.   Right Atrium: Right atrial size was normal in size.   Mitral Valve: The mitral valve is normal in structure. Trivial mitral  valve regurgitation.   Tricuspid Valve: The tricuspid valve is normal in structure. Tricuspid  valve regurgitation is trivial.   Aortic Valve: The aortic valve is tricuspid. There is mild calcification  of the aortic valve. There is mild thickening of the aortic valve. Aortic  valve regurgitation is not visualized. Aortic valve peak gradient measures  3.3 mmHg.   Pulmonic Valve: The pulmonic valve was normal in structure. Pulmonic valve  regurgitation is trivial.   Aorta: The aortic root is normal in size and structure.   Venous: The inferior vena cava is normal in size with greater than 50%  respiratory variability, suggesting right atrial pressure of 3 mmHg.   IAS/Shunts: There is right bowing of the interatrial septum, suggestive of  elevated left atrial pressure.   Cardiac MRI: 03/20/2023  IMPRESSION: 1. Findings consistent with Takotsubo cardiomyopathy, including akinesis of LV apical segments, no late gadolinium enhancement, and elevated T2 values suggesting myocardial edema in apical segments.   2.  LV apical thrombus measures 12mm x 10mm x 6mm   3. Normal LV size, mild hypertrophy, and mild systolic dysfunction (EF 41%)   4.  Normal RV size with mild systolic dysfunction (EF 46%)     Electronically Signed   By: Epifanio Lesches M.D.   On: 03/20/2023 22:30  Patient Profile     77 y.o. female with history of hypertension, CKD, T2DM, hyperlipidemia who presented with elevated troponin and ST elevations on EKG. Taken to [facility], showed normal coronary arteries   Assessment &  Plan    Elevated Troponin -- hsTn peaked at 3099, underwent cardiac cath noted above with no coronary artery disease noted.  -- Cardiac MRI confirmed stress cardiomyopathy  Takotsubo cardiomyopathy -- echo showed LVEF of 25-30% with WMA suggestive of stress cardiomyopathy. Underwent cardiac MRI 6/2 consistent with takostsubo, LVEF 41% -- GDMT: initially started but blood pressures became soft, with rise in Cr. Continue Coreg 3.125mg  BID, hold on additional agents for now.  LV thrombus -- apical LV thrombus on cardiac MRI -- now on Eliquis 5mg  BID  AKI -- Cr peaked at 2.09>> improved to 1.42  HTN -- initially elevated on admission, dropped in the setting of addition of GDMT. Currently stable on low dose coreg 3.125mg  BID  HLD -- continue Crestor 40mg  daily   DM -- Hgb A1c 6.9 -- SSI  -- attempted to add jardiance, but BP dropped in the setting of other GDMT. Family does not wish to resume at this time  Delirium -- received a dose of Haldol on 5/30 -- re-direction and family at the bedside -- significantly improved   PT eval ordered, CM to set up for outpatient [facility] resources   For questions or updates, please contact [facility] Please consult [email or web address] for contact info under        Signed, Laverda Page, NP  03/21/2023, 7:50 AM     Patient seen and examined. Agree with assessment and plan.  Patient feels well without chest pain or shortness of breath.  Telemetry reveals sinus rhythm in the 70s.  She is now just on carvedilol at reduced dose 3.125 mg twice a day  and is no longer on Jardiance.  Creatinine had increased to 2.09, now improved at 1.42.  Cardiac MRI confirmed Takotsubo cardiomyopathy with LV apical thrombus.  She is now on Eliquis 5 mg twice a day without bleeding.  Prior to admission, the patient was on glipizide 2.5 mg on Tuesday, Thursday and Saturday.  May need to be reinstituted as outpatient.  Will repeat laboratory in a.m. tomorrow if patient  remains stable possible consider discharge tomorrow.  Will need repeat echo in 1 month to evaluate improvement in LV systolic function and resolution of apical thrombus   Lennette Bihari, MD, [facility] 03/21/2023 9:27 AM

## 2023-03-21 NOTE — Progress Notes (Signed)
   Heart Failure Stewardship Pharmacist Progress Note   PCP: Kerri Perches, MD PCP-Cardiologist: Marjo Bicker, MD   HPI:  77 YO female with a PMH of HTN, T2DM, HLD, CKD.   [facility] with complaints of chest and back pain as well as nausea. Troponin 2500>>3000 on admission and chest pain continued after medication administration. Patient was urgently taken to the cath lab due to suspicion for STEMI, however, cath revealed normal CAD with no culprit lesion so etiology of CP remains unclear. Per cardiology, possible etiologies include cleared culprit lesion with IV heparin and NTG, MINOCA, and myocarditis. Echo from 5/30 showing EF 25-30%, Takotstubo cardiomyopathy, regional wall motion abnormalities, G1DD, trivial MVR, RAP 3 mmHg, normal PASP. Cardiac MRI on 6/2 shows findings consistent with Takotsubo cardiomyopathy, LV apical thrombus.   Current HF Medications: Beta Blocker: carvedilol 3.125 mg BID  Prior to admission HF Medications: Beta blocker: carvedilol 12.5 mg BID ACE/ARB/ARNI: benazepril 40 mg daily  Pertinent Lab Values: Serum creatinine 1.42, BUN 18, Potassium 4.0, Sodium 134, Magnesium 2.1, A1c 6.9%   Vital Signs: Weight: 138 lbs (admission weight: 137 lbs) Blood pressure: 120/70s  Heart rate: 70s  I/O: +766mL yesterday; net +255mL  Medication Assistance / Insurance Benefits Check: Does the patient have prescription insurance?  Yes Type of insurance plan: Medicare [facility])  Does the patient qualify for medication assistance through manufacturers or grants?   Yes Eligible grants and/or patient assistance programs: Valla Leaver Medication assistance applications in progress: Valla Leaver Medication assistance applications approved: None Approved medication assistance renewals will be completed by: pending  Outpatient Pharmacy:  Prior to admission outpatient pharmacy: [facility] Is the patient willing to use [facility]  pharmacy at discharge? Yes Is the patient willing to transition their outpatient pharmacy to utilize a [facility] outpatient pharmacy?   Pending   Assessment: 1. Acute systolic CHF (LVEF 25-30%), due to NICM. NYHA class III symptoms. -Scr improved 1.58>1.42 from yesterday (BL 1.2-1.5). Strict I's and O's. Keep K >4 and Mg >2.  - Continue carvedilol 3.125 mg BID - Holding ARB with hypotensive episode  - Continue Jardiance 10 mg daily and continue to watch Scr (variable at baseline) - Patient's daughter reports that she has been on spironolactone multiple times in the past and is consistently taken off of the medication due to worsening renal function--would hold off on starting for now. - Jardiance also held when BP dropped in the setting of other GDMT. Consider restarting Jardiance 10 mg daily. Per cardiology report, family does not wish to resume at this time.    Plan: 1) Medication changes recommended at this time: - Restart Jardiance 10 mg daily when pt/family agreeable   2) Patient assistance: - Farxiga - $95 Valla Leaver - $45 - Patient assistance needed for both Jardiance and Sherryll Burger (if this medication is started prior to discharge)--pharmacy will complete the forms empirically in case of discharge over the weekend.  3)  Education  - Full medication education completed with patient's daughter  - Education with patient to be completed prior to discharge   Sharen Hones, PharmD, BCPS Heart Failure Engineer, building services Phone [phone]

## 2023-03-21 NOTE — Discharge Instructions (Signed)
Information on my medicine - ELIQUIS (apixaban)   Why was Eliquis prescribed for you? Eliquis was prescribed for you to reduce the risk of stroke from a clot found in the heart.   What do You need to know about Eliquis ? Take your Eliquis TWICE DAILY - one tablet in the morning and one tablet in the evening with or without food. If you have difficulty swallowing the tablet whole please discuss with your pharmacist how to take the medication safely.  Take Eliquis exactly as prescribed by your doctor and DO NOT stop taking Eliquis without talking to the doctor who prescribed the medication.  Stopping may increase your risk of developing a stroke.  Refill your prescription before you run out.  After discharge, you should have regular check-up appointments with your healthcare provider that is prescribing your Eliquis.  In the future your dose may need to be changed if your kidney function or weight changes by a significant amount or as you get older.  What do you do if you miss a dose? If you miss a dose, take it as soon as you remember on the same day and resume taking twice daily.  Do not take more than one dose of ELIQUIS at the same time to make up a missed dose.  Important Safety Information A possible side effect of Eliquis is bleeding. You should call your healthcare provider right away if you experience any of the following: Bleeding from an injury or your nose that does not stop. Unusual colored urine (red or dark brown) or unusual colored stools (red or black). Unusual bruising for unknown reasons. A serious fall or if you hit your head (even if there is no bleeding).  Some medicines may interact with Eliquis and might increase your risk of bleeding or clotting while on Eliquis. To help avoid this, consult your healthcare provider or pharmacist prior to using any new prescription or non-prescription medications, including herbals, vitamins, non-steroidal anti-inflammatory  drugs (NSAIDs) and supplements.  This website has more information on Eliquis (apixaban): http://www.eliquis.com/eliquis/home

## 2023-03-22 ENCOUNTER — Telehealth: Payer: Self-pay | Admitting: Cardiology

## 2023-03-22 ENCOUNTER — Other Ambulatory Visit (HOSPITAL_COMMUNITY): Payer: Self-pay

## 2023-03-22 DIAGNOSIS — I5181 Takotsubo syndrome: Secondary | ICD-10-CM

## 2023-03-22 DIAGNOSIS — I513 Intracardiac thrombosis, not elsewhere classified: Secondary | ICD-10-CM | POA: Insufficient documentation

## 2023-03-22 LAB — COMPREHENSIVE METABOLIC PANEL
ALT: 23 U/L (ref 0–44)
AST: 49 U/L — ABNORMAL HIGH (ref 15–41)
Albumin: 3.7 g/dL (ref 3.5–5.0)
Alkaline Phosphatase: 71 U/L (ref 38–126)
Anion gap: 11 (ref 5–15)
BUN: 18 mg/dL (ref 8–23)
CO2: 25 mmol/L (ref 22–32)
Calcium: 9.4 mg/dL (ref 8.9–10.3)
Chloride: 99 mmol/L (ref 98–111)
Creatinine, Ser: 1.27 mg/dL — ABNORMAL HIGH (ref 0.44–1.00)
GFR, Estimated: 44 mL/min — ABNORMAL LOW (ref >60)
Glucose, Bld: 120 mg/dL — ABNORMAL HIGH (ref 70–99)
Potassium: 4.3 mmol/L (ref 3.5–5.1)
Sodium: 135 mmol/L (ref 135–145)
Total Bilirubin: 0.7 mg/dL (ref 0.3–1.2)
Total Protein: 6.3 g/dL — ABNORMAL LOW (ref 6.5–8.1)

## 2023-03-22 LAB — CBC
HCT: 37.2 % (ref 36.0–46.0)
Hemoglobin: 12.7 g/dL (ref 12.0–15.0)
MCH: 31.4 pg (ref 26.0–34.0)
MCHC: 34.1 g/dL (ref 30.0–36.0)
MCV: 92.1 fL (ref 80.0–100.0)
Platelets: 194 10*3/uL (ref 150–400)
RBC: 4.04 MIL/uL (ref 3.87–5.11)
RDW: 12.6 % (ref 11.5–15.5)
WBC: 4.9 10*3/uL (ref 4.0–10.5)
nRBC: 0 % (ref 0.0–0.2)

## 2023-03-22 LAB — GLUCOSE, CAPILLARY: Glucose-Capillary: 206 mg/dL — ABNORMAL HIGH (ref 70–99)

## 2023-03-22 MED ORDER — CARVEDILOL 3.125 MG PO TABS
3.1250 mg | ORAL_TABLET | Freq: Two times a day (BID) | ORAL | 1 refills | Status: DC
  Filled 2023-03-22: qty 60, 30d supply, fill #0

## 2023-03-22 MED ORDER — APIXABAN 5 MG PO TABS
5.0000 mg | ORAL_TABLET | Freq: Two times a day (BID) | ORAL | 11 refills | Status: DC
  Filled 2023-03-22: qty 60, 30d supply, fill #0

## 2023-03-22 MED ORDER — ROSUVASTATIN CALCIUM 40 MG PO TABS
40.0000 mg | ORAL_TABLET | Freq: Every day | ORAL | 3 refills | Status: DC
  Filled 2023-03-22: qty 90, 90d supply, fill #0

## 2023-03-22 NOTE — Telephone Encounter (Signed)
   Transition of Care Follow-up Phone Call Request    Patient Name: AKAYLA BRANIFF Date of Birth: 05/11/1946 Date of Encounter: 03/22/2023  Primary Care Provider:  Kerri Perches, MD Primary Cardiologist:  Marjo Bicker, MD  [name] has been scheduled for a transition of care follow up appointment with a [facility] provider:  Sharlene Dory 6/17  Please reach out to [name] within 48 hours to confirm appointment and review transition of care protocol questionnaire.  Laverda Page, NP  03/22/2023, 10:56 AM

## 2023-03-22 NOTE — Care Management Important Message (Signed)
Important Message  Patient Details  Name: Mikayla Jenkins MRN: [id number] Date of Birth: 1946-04-08   Medicare Important Message Given:  Yes     Renie Ora 03/22/2023, 10:35 AM

## 2023-03-22 NOTE — Progress Notes (Signed)
   Heart Failure Stewardship Pharmacist Progress Note   PCP: Kerri Perches, MD PCP-Cardiologist: Marjo Bicker, MD   HPI:  77 YO female with a PMH of HTN, T2DM, HLD, CKD.   [facility] on [facility] with complaints of chest and back pain as well as nausea. Troponin 2500>>3000 on admission and chest pain continued after medication administration. Patient was urgently taken to the cath lab due to suspicion for STEMI, however, cath revealed normal CAD with no culprit lesion so etiology of CP remains unclear. Per cardiology, possible etiologies include cleared culprit lesion with IV heparin and NTG, MINOCA, and myocarditis. Echo from 5/30 showing EF 25-30%, Takotstubo cardiomyopathy, regional wall motion abnormalities, G1DD, trivial MVR, RAP 3 mmHg, normal PASP. Cardiac MRI on 6/2 shows findings consistent with Takotsubo cardiomyopathy, LV apical thrombus.   Discharge HF Medications: Beta Blocker: carvedilol 3.125 mg BID  Prior to admission HF Medications: Beta blocker: carvedilol 12.5 mg BID ACE/ARB/ARNI: benazepril 40 mg daily  Pertinent Lab Values: Serum creatinine 1.27, BUN 18, Potassium 4.3, Sodium 135, Magnesium 2.1, A1c 6.9%   Vital Signs: Weight: 138 lbs (admission weight: 137 lbs) Blood pressure: 130/70s  Heart rate: 70-80s  I/O: -0.4L yesterday; net -5.1L  Medication Assistance / Insurance Benefits Check: Does the patient have prescription insurance?  Yes Type of insurance plan: Medicare [facility])  Outpatient Pharmacy:  Prior to admission outpatient pharmacy: [facility] Is the patient willing to use [facility] TOC pharmacy at discharge? Yes Is the patient willing to transition their outpatient pharmacy to utilize a [facility] outpatient pharmacy?   Pending   Assessment: 1. Acute systolic CHF (LVEF 25-30%), due to NICM. NYHA class III symptoms. -Scr improved 1.58>1.42 from yesterday (BL 1.2-1.5). Strict I's and O's. Keep K >4 and Mg >2.  - Continue carvedilol  3.125 mg BID - Holding ARB with hypotensive episode  - Patient's daughter reports that she has been on spironolactone multiple times in the past and is consistently taken off of the medication due to worsening renal function--would hold off on starting for now. - Jardiance also held when BP dropped in the setting of other GDMT. Consider restarting Jardiance 10 mg daily. Per cardiology report, family does not wish to resume at this time.    Plan: 1) Medication changes recommended at this time: - Restart Jardiance 10 mg daily at follow up  2) Patient assistance: - Farxiga - $95 Valla Leaver - $45 - Patient assistance needed for both Jardiance and Entresto - however patient is not on current treatment with either agent at this time.  - If [place] Pepper is restarted at follow up, can submit paperwork  3)  Education  - Patient has been educated on current HF medications and potential additions to HF medication regimen - Patient verbalizes understanding that over the next few months, these medication doses may change and more medications may be added to optimize HF regimen - Patient has been educated on basic disease state pathophysiology and goals of therapy    Sharen Hones, PharmD, BCPS Heart Failure Stewardship Pharmacist Phone [phone]

## 2023-03-22 NOTE — TOC Initial Note (Signed)
Transition of Care [facility]) - Initial/Assessment Note    Patient Details  Name: Mikayla Jenkins MRN: [id number] Date of Birth: 01-06-1946  Transition [facility] CM/SW Contact:    Gala Lewandowsky, RN Phone Number: 03/22/2023, 10:27 AM  Clinical Narrative: Patient presented for chest pain. PTA patient was from home with spouse. Children are at the bedside to discuss disposition needs. Patient is in need of [facility] RN Services for medication and disease management. Case Manager provided the Medicare.gov list to the family and they chose [facility]. Referral submitted to [facility] and start of care to begin within 24-48 hours post transition home. Case Manager added a CSW for community resources- also discussed resources: aging, disability, and transit services and provided this information to the patients family. Case Manager discussed option of personal care services in the home as well. Per daughter, patient will have constant supervision in the home by spouse. No further needs identified at this time.              Expected Discharge Plan: Home w Home Health Services Barriers to Discharge: No Barriers Identified   Patient Goals and CMS Choice Patient states their goals for this hospitalization and ongoing recovery are:: to return home CMS Medicare.gov Compare Post Acute Care list provided to:: Patient Choice offered to / list presented to : Patient, Adult Children      Expected Discharge Plan and Services In-house Referral: NA Discharge Planning Services: CM Consult   Living arrangements for the past 2 months: Single Family Home                   DME Agency: NA       HH Arranged: RN, Disease Management, Social Work [employer] Agency: [facility] Date HH Agency Contacted: 03/22/23 Time HH Agency Contacted: 1026 Representative spoke with at [facility] Agency: Tresa Endo  Prior Living Arrangements/Services Living arrangements for the past 2 months: Single Family  Home Lives with:: Spouse Patient language and need for interpreter reviewed:: Yes Do you feel safe going back to the place where you live?: Yes      Need for Family Participation in Patient Care: Yes (Comment) Care giver support system in place?: Yes (comment)   Criminal Activity/Legal Involvement Pertinent to Current Situation/Hospitalization: No - Comment as needed  Activities of Daily Living Home Assistive Devices/Equipment: CPAP, Dentures (specify type) ADL Screening (condition at time of admission) Patient's cognitive ability adequate to safely complete daily activities?: No Is the patient deaf or have difficulty hearing?: No Does the patient have difficulty seeing, even when wearing glasses/contacts?: No Does the patient have difficulty concentrating, remembering, or making decisions?: Yes Patient able to express need for assistance with ADLs?: Yes Does the patient have difficulty dressing or bathing?: No Independently performs ADLs?: Yes (appropriate for developmental age) Does the patient have difficulty walking or climbing stairs?: Yes Weakness of Legs: Right Weakness of Arms/Hands: None  Permission Sought/Granted Permission sought to share information with : Family Supports, Magazine features editor, Case Estate manager/land agent granted to share information with : Yes, Verbal Permission Granted     Permission granted to share info w AGENCY: [facility]        Emotional Assessment Appearance:: Appears stated age Attitude/Demeanor/Rapport: Engaged Affect (typically observed): Appropriate Orientation: : Oriented to Self Alcohol / Substance Use: Not Applicable Psych Involvement: No (comment)  Admission diagnosis:  ST elevation myocardial infarction (STEMI), unspecified artery (HCC) [I21.3] NSTEMI (non-ST elevated myocardial infarction) [facility]) [I21.4] Patient Active Problem List  Diagnosis Date Noted   Takotsubo cardiomyopathy 03/22/2023   Acute combined systolic and  diastolic heart failure (HCC) 03/19/2023   Hypotension 03/19/2023   AKI (acute kidney injury) (HCC) 03/19/2023   NSTEMI (non-ST elevated myocardial infarction) (HCC) 03/16/2023   OSA (obstructive sleep apnea) 03/15/2023   Early satiety 12/14/2022   Systolic murmur 11/26/2022   Loss of weight 11/15/2022   Status post total replacement of right hip 01/06/2021   Osteoarthritis of right hip 08/18/2020   Anemia in chronic kidney disease 10/09/2019   CKD (chronic kidney disease), stage III (HCC) 10/03/2019   Female bladder prolapse 06/30/2015   Lipoma of abdominal wall 01/24/2013   Low back pain with right-sided sciatica 04/26/2012   ALLERGIC RHINITIS, SEASONAL 04/13/2009   Type 2 diabetes mellitus with nephropathy (HCC) 01/10/2008   Hyperlipidemia associated with type 2 diabetes mellitus (HCC) 01/10/2008   HTN (hypertension) 01/10/2008   PCP:  Kerri Perches, MD Pharmacy:   [facility] [address] [place] - [address] [place] [address] Phone: [phone] Fax: [phone]  [facility] - [place], [place] - [address] [place] [address] Phone: [phone] Fax: [phone]  [facility] [address] [place] [address] Phone: [phone] Fax: [phone]     Social Determinants of Health (SDOH) Social History: SDOH Screenings   Food Insecurity: No Food Insecurity (03/17/2023)  Housing: Patient Declined (03/18/2023)  Transportation Needs: No Transportation Needs (03/17/2023)  Utilities: Not At Risk (03/17/2023)  Alcohol Screen: Low Risk  (03/18/2023)  Depression (PHQ2-9): Low Risk  (03/03/2023)  Financial Resource Strain: Low Risk  (03/18/2023)  Physical Activity: Insufficiently Active (05/31/2022)  Social Connections: Moderately Integrated (05/31/2022)  Stress: No Stress Concern Present (05/31/2022)  Tobacco Use: Low Risk  (03/18/2023)   SDOH  Interventions: Housing Interventions: Intervention Not Indicated Alcohol Usage Interventions: Intervention Not Indicated (Score <7) Financial Strain Interventions: Intervention Not Indicated   Readmission Risk Interventions     No data to display

## 2023-03-22 NOTE — Discharge Summary (Addendum)
Discharge Summary    Patient ID: Mikayla Jenkins MRN: [id number]; DOB: 08-Oct-1946  Admit date: 03/16/2023 Discharge date: 03/22/2023  PCP:  Kerri Perches, MD   [facility] Providers Cardiologist:  Marjo Bicker, MD     Discharge Diagnoses    Principal Problem:   NSTEMI (non-ST elevated myocardial infarction) [facility]) Active Problems:   Type 2 diabetes mellitus with nephropathy (HCC)   Hyperlipidemia associated with type 2 diabetes mellitus (HCC)   HTN (hypertension)   CKD (chronic kidney disease), stage III (HCC)   Acute combined systolic and diastolic heart failure (HCC)   Hypotension   AKI (acute kidney injury) (HCC)   Takotsubo cardiomyopathy   Apical mural thrombus  Diagnostic Studies/Procedures    Cath: 03/16/2023   Angiographically normal coronary artery disease with no obvious culprit lesion. Codominant system. Difficult to image left ventriculography, would recommend 2D echo but appears to be normal EF. Normal EDP.    Plan: Will need to consider different etiology for troponin elevation however for now we will go ahead and treat as though this could have been ACS with a lesion that cleared with nitroglycerin, aspirin and heparin.. For now we will treat with DAPT and 40 hours of IV heparin until other etiology is determined. Check 2D echo to better assess EF and consider cardiac MRI as there is concern for troponin elevation and her discomfort being related to myocarditis. Will continue most home medications but hold the thiazide diuretic and clonidine nightly. Sliding-scale insulin. Standard labs checked with A1c, lipid panel etc.     Anticipate relatively short stay depending additional evaluation.   Echo: 03/17/2023   IMPRESSIONS     1. All mid-to-apical LV segments are akinetic. Wall motion pattern most  consistent with takostubo cardiomyopathy with normal coronaries noted on  cath from 03/16/23. Left ventricular ejection fraction, by  estimation, is  25 to 30%. The left ventricle has  severely decreased function. The left ventricle demonstrates regional wall  motion abnormalities (see scoring diagram/findings for description). Left  ventricular diastolic parameters are consistent with Grade I diastolic  dysfunction (impaired relaxation).   2. Right ventricular systolic function is normal. The right ventricular  size is normal. There is normal pulmonary artery systolic pressure. The  estimated right ventricular systolic pressure is 28.6 mmHg.   3. Left atrial size was severely dilated.   4. The mitral valve is normal in structure. Trivial mitral valve  regurgitation.   5. The aortic valve is tricuspid. There is mild calcification of the  aortic valve. There is mild thickening of the aortic valve. Aortic valve  regurgitation is not visualized.   6. The inferior vena cava is normal in size with greater than 50%  respiratory variability, suggesting right atrial pressure of 3 mmHg.   FINDINGS   Left Ventricle: All mid-to-apical LV segments are akinetic. Wall motion  pattern most consistent with takostubo cardiomyopathy with normal  coronaries noted on cath from 03/16/23. Left ventricular ejection fraction,  by estimation, is 25 to 30%. The left  ventricle has severely decreased function. The left ventricle demonstrates  regional wall motion abnormalities. The left ventricular internal cavity  size was normal in size. There is no left ventricular hypertrophy. Left  ventricular diastolic parameters  are consistent with Grade I diastolic dysfunction (impaired relaxation).   Right Ventricle: The right ventricular size is normal. Right ventricular  systolic function is normal. There is normal pulmonary artery systolic  pressure. The tricuspid regurgitant velocity is 2.53 m/s,  and with an  assumed right atrial pressure of 3 mmHg,   the estimated right ventricular systolic pressure is 28.6 mmHg.   Left Atrium: Left atrial  size was severely dilated.   Right Atrium: Right atrial size was normal in size.   Mitral Valve: The mitral valve is normal in structure. Trivial mitral  valve regurgitation.   Tricuspid Valve: The tricuspid valve is normal in structure. Tricuspid  valve regurgitation is trivial.   Aortic Valve: The aortic valve is tricuspid. There is mild calcification  of the aortic valve. There is mild thickening of the aortic valve. Aortic  valve regurgitation is not visualized. Aortic valve peak gradient measures  3.3 mmHg.   Pulmonic Valve: The pulmonic valve was normal in structure. Pulmonic valve  regurgitation is trivial.   Aorta: The aortic root is normal in size and structure.   Venous: The inferior vena cava is normal in size with greater than 50%  respiratory variability, suggesting right atrial pressure of 3 mmHg.   IAS/Shunts: There is right bowing of the interatrial septum, suggestive of  elevated left atrial pressure.    Cardiac MRI: 03/20/2023   IMPRESSION: 1. Findings consistent with Takotsubo cardiomyopathy, including akinesis of LV apical segments, no late gadolinium enhancement, and elevated T2 values suggesting myocardial edema in apical segments.   2.  LV apical thrombus measures 12mm x 10mm x 6mm   3. Normal LV size, mild hypertrophy, and mild systolic dysfunction (EF 41%)   4.  Normal RV size with mild systolic dysfunction (EF 46%)     Electronically Signed   By: Epifanio Lesches M.D.   On: 03/20/2023 22:30 _____________   History of Present Illness     Mikayla Jenkins is a 77 y.o. female with HTN, HLD, DM2, CKD who presented on 5/30 with chest pain and NSTEMI.   Mikayla Jenkins is a 76 yo female with multiple CV risk factors who presented to [facility] at 2100 after 30 minutes of acute chest pain with radiation to back.  Associated symptoms of nausea.  She has no prior cardiac history.  Chest pain at the worst was 8 out of 10.  On arrival to the ED was afebrile  hemodynamically stable.  Her initial EKG was of poor quality and initial labs were drawn and troponin returned back at 2500 and repeat EKG was done demonstrating minor ST elevations in lead I and aVL, with anterior lateral T wave versions that were new.  Code STEMI was activated.   She was then given aspirin, heparin, nitro sublingual x 2 with improvement in her chest pain 6 out of 10.  Repeat EKG showed improved lateral EKG findings but persistent anterior lateral T wave versions.  Her chest pain was persistent at 8 out of 10 on arrival to [facility].  Thus decision was made to take her urgently to the Cath Lab for unstable NSTEMI.  Creatinine 1.26.  She denies any history of stroke.  Chest x-ray unremarkable on review.  Hospital Course     Consultants: N/a    Elevated Troponin -- hsTn peaked at 3099, underwent cardiac cath noted above with no coronary artery disease noted.  -- Cardiac MRI confirmed stress cardiomyopathy -- given the need for Eliquis, ASA was stopped.    Takotsubo cardiomyopathy -- echo showed LVEF of 25-30% with WMA suggestive of stress cardiomyopathy. Underwent cardiac MRI 6/2 consistent with takostsubo, LVEF 41% -- GDMT: initially started but blood pressures became soft, with rise in Cr. Continue Coreg  3.125mg  BID, hold on additional agents for now.   LV thrombus -- apical LV thrombus on cardiac MRI -- now on Eliquis 5mg  BID   AKI -- Cr peaked at 2.09>> improved to 1.27   HTN -- initially elevated on admission, dropped in the setting of addition of GDMT. Currently stable on low dose coreg 3.125mg  BID   HLD -- continue Crestor 40mg  daily    DM -- Hgb A1c 6.9 -- SSI  -- attempted to add jardiance, but BP dropped in the setting of other GDMT. Family does not wish to resume at this time   Delirium -- received a dose of Haldol on 5/30 -- re-direction and family at the bedside -- significantly improved   General: Well developed, well nourished, female appearing  in no acute distress. Head: Normocephalic, atraumatic.  Neck: Supple without bruits, JVD. Lungs:  Resp regular and unlabored, CTA. Heart: RRR, S1, S2, no S3, S4, or murmur; no rub. Abdomen: Soft, non-tender, non-distended with normoactive bowel sounds. No hepatomegaly. No rebound/guarding. No obvious abdominal masses. Extremities: No clubbing, cyanosis, edema. Distal pedal pulses are 2+ bilaterally. Right cath site stable without bruising or hematoma Neuro: Alert and oriented X 3. Moves all extremities spontaneously. Psych: Normal affect.  Patient seen by Dr. Tresa Endo and deemed stable for discharge. Follow up arranged in the office. Medications sent to the [facility] pharmacy. Educated by pharmD prior to discharge.   Did the patient have an acute coronary syndrome (MI, NSTEMI, STEMI, etc) this admission?:  Yes                               AHA/ACC Clinical Performance & Quality Measures: Aspirin prescribed? - No - No PCI, likely stress induced cardiomyopathy , needs OAC ADP Receptor Inhibitor (Plavix/Clopidogrel, Brilinta/Ticagrelor or Effient/Prasugrel) prescribed (includes medically managed patients)? - No - no PCI, need for [facility] Beta Blocker prescribed? - Yes High Intensity Statin (Lipitor 40-80mg  or Crestor 20-40mg ) prescribed? - Yes EF assessed during THIS hospitalization? - Yes For EF <40%, was ACEI/ARB prescribed? - No - Reason:  attempted to add but BP soft For EF <40%, Aldosterone Antagonist (Spironolactone or Eplerenone) prescribed? - No - Reason:  daughter reports issues with hyperkalemia  Cardiac Rehab Phase II ordered (including medically managed patients)? - Yes    The patient will be scheduled for a TOC follow up appointment in 10-14 days.  A message has been sent to the [facility] and Scheduling Pool at the office where the patient should be seen for follow up.  _____________  Discharge Vitals Blood pressure 120/66, pulse 82, temperature 97.7 F (36.5 C), temperature source Oral,  resp. rate 10, height 5\' 5"  (1.651 m), weight 62.6 kg, SpO2 100 %.  Filed Weights   03/16/23 2017 03/17/23 0028  Weight: 62.1 kg 62.6 kg    Labs & Radiologic Studies    CBC Recent Labs    03/21/23 0316 03/22/23 0433  WBC 5.6 4.9  HGB 12.1 12.7  HCT 35.9* 37.2  MCV 92.1 92.1  PLT 183 194   Basic Metabolic Panel Recent Labs    16/10/96 0218 03/21/23 0316 03/22/23 0433  NA 130* 134* 135  K 4.0 4.0 4.3  CL 96* 99 99  CO2 25 24 25   GLUCOSE 116* 130* 120*  BUN 20 18 18   CREATININE 1.58* 1.42* 1.27*  CALCIUM 8.9 9.1 9.4  MG 2.1  --   --    Liver Function Tests  Recent Labs    03/22/23 0433  AST 49*  ALT 23  ALKPHOS 71  BILITOT 0.7  PROT 6.3*  ALBUMIN 3.7   No results for input(s): "LIPASE", "AMYLASE" in the last 72 hours. High Sensitivity Troponin:   Recent Labs  Lab 03/16/23 2032 03/16/23 2223  TROPONINIHS 2,557* 3,099*    BNP Invalid input(s): "POCBNP" D-Dimer No results for input(s): "DDIMER" in the last 72 hours. Hemoglobin A1C No results for input(s): "HGBA1C" in the last 72 hours. Fasting Lipid Panel No results for input(s): "CHOL", "HDL", "LDLCALC", "TRIG", "CHOLHDL", "LDLDIRECT" in the last 72 hours. Thyroid Function Tests No results for input(s): "TSH", "T4TOTAL", "T3FREE", "THYROIDAB" in the last 72 hours.  Invalid input(s): "FREET3" _____________  MR CARDIAC MORPHOLOGY W WO CONTRAST  Result Date: 03/20/2023 CLINICAL DATA:  15F presents with MINOCA EXAM: CARDIAC MRI TECHNIQUE: The patient was scanned on a 1.5 Tesla Siemens magnet. A dedicated cardiac coil was used. Functional imaging was done using Fiesta sequences. 2,3, and 4 chamber views were done to assess for RWMA's. Modified Simpson's rule using a short axis stack was used to calculate an ejection fraction on a dedicated work [employer]. The patient received 9 cc of Gadavist. After 10 minutes inversion recovery sequences were used to assess for infiltration and scar  tissue. Phase contrast velocity mapping was performed above the aortic and pulmonic valves CONTRAST:  9 cc  of Gadavist FINDINGS: Left ventricle: -Normal size -Mild hypertrophy -Mild systolic dysfunction.  Apical akinesis -LV apical thrombus measures 12mm x 10mm x 6mm -Elevated ECV (30%) -Elevated T2 values in apical segments, measuring up to 63ms in apical septum -No LGE LV EF:  41% (Normal 52-79%) Absolute volumes: LV EDV: (Normal 78-167 mL) LV ESV: 61mL (Normal 21-64 mL) LV SV: 42mL (Normal 52-114 mL) CO: 2.9L/min (Normal 2.7-6.3 L/min) Indexed volumes: LV EDV: 78mL/sq-m (Normal 50-96 mL/sq-m) LV ESV: 14mL/sq-m (Normal 10-40 mL/sq-m) LV SV: 71mL/sq-m (Normal 33-64 mL/sq-m) CI: 1.7L/min/sq-m (Normal 1.9-3.9 L/min/sq-m) Right ventricle: Normal size with mild systolic dysfunction. RV EF: 46% (Normal 52-80%) Absolute volumes: RV EDV: 94mL (Normal 79-175 mL) RV ESV: 51mL (Normal 13-75 mL) RV SV: 44mL (Normal 56-110 mL) CO: 3.0L/min (Normal 2.7-6 L/min) Indexed volumes: RV EDV: 51mL/sq-m (Normal 51-97 mL/sq-m) RV ESV: 75mL/sq-m (Normal 9-42 mL/sq-m) RV SV: 58mL/sq-m (Normal 35-61 mL/sq-m) CI: 1.8L/min/sq-m (Normal 1.8-3.8 L/min/sq-m) Left atrium: Normal size Right atrium: Normal size Mitral valve: Trivial regurgitation Aortic valve: Tricuspid.  Trivial regurgitation Tricuspid valve: Trivial regurgitation Pulmonic valve: Trivial regurgitation Aorta: Normal proximal ascending aorta Pericardium: Small effusion IMPRESSION: 1. Findings consistent with Takotsubo cardiomyopathy, including akinesis of LV apical segments, no late gadolinium enhancement, and elevated T2 values suggesting myocardial edema in apical segments. 2.  LV apical thrombus measures 12mm x 10mm x 6mm 3. Normal LV size, mild hypertrophy, and mild systolic dysfunction (EF 41%) 4.  Normal RV size with mild systolic dysfunction (EF 46%) Electronically Signed   By: Epifanio Lesches M.D.   On: 03/20/2023 22:30   MR CARDIAC VELOCITY FLOW  MAP  Result Date: 03/20/2023 CLINICAL DATA:  15F presents with MINOCA EXAM: CARDIAC MRI TECHNIQUE: The patient was scanned on a 1.5 Tesla Siemens magnet. A dedicated cardiac coil was used. Functional imaging was done using Fiesta sequences. 2,3, and 4 chamber views were done to assess for RWMA's. Modified Simpson's rule using a short axis stack was used to calculate an ejection fraction on a dedicated work [employer]. The patient received 9 cc of Gadavist.  After 10 minutes inversion recovery sequences were used to assess for infiltration and scar tissue. Phase contrast velocity mapping was performed above the aortic and pulmonic valves CONTRAST:  9 cc  of Gadavist FINDINGS: Left ventricle: -Normal size -Mild hypertrophy -Mild systolic dysfunction.  Apical akinesis -LV apical thrombus measures 12mm x 10mm x 6mm -Elevated ECV (30%) -Elevated T2 values in apical segments, measuring up to 63ms in apical septum -No LGE LV EF:  41% (Normal 52-79%) Absolute volumes: LV EDV: (Normal 78-167 mL) LV ESV: 61mL (Normal 21-64 mL) LV SV: 42mL (Normal 52-114 mL) CO: 2.9L/min (Normal 2.7-6.3 L/min) Indexed volumes: LV EDV: 49mL/sq-m (Normal 50-96 mL/sq-m) LV ESV: 45mL/sq-m (Normal 10-40 mL/sq-m) LV SV: 3mL/sq-m (Normal 33-64 mL/sq-m) CI: 1.7L/min/sq-m (Normal 1.9-3.9 L/min/sq-m) Right ventricle: Normal size with mild systolic dysfunction. RV EF: 46% (Normal 52-80%) Absolute volumes: RV EDV: 94mL (Normal 79-175 mL) RV ESV: 51mL (Normal 13-75 mL) RV SV: 44mL (Normal 56-110 mL) CO: 3.0L/min (Normal 2.7-6 L/min) Indexed volumes: RV EDV: 35mL/sq-m (Normal 51-97 mL/sq-m) RV ESV: 4mL/sq-m (Normal 9-42 mL/sq-m) RV SV: 29mL/sq-m (Normal 35-61 mL/sq-m) CI: 1.8L/min/sq-m (Normal 1.8-3.8 L/min/sq-m) Left atrium: Normal size Right atrium: Normal size Mitral valve: Trivial regurgitation Aortic valve: Tricuspid.  Trivial regurgitation Tricuspid valve: Trivial regurgitation Pulmonic valve: Trivial regurgitation Aorta:  Normal proximal ascending aorta Pericardium: Small effusion IMPRESSION: 1. Findings consistent with Takotsubo cardiomyopathy, including akinesis of LV apical segments, no late gadolinium enhancement, and elevated T2 values suggesting myocardial edema in apical segments. 2.  LV apical thrombus measures 12mm x 10mm x 6mm 3. Normal LV size, mild hypertrophy, and mild systolic dysfunction (EF 41%) 4.  Normal RV size with mild systolic dysfunction (EF 46%) Electronically Signed   By: Epifanio Lesches M.D.   On: 03/20/2023 22:30   MR CARDIAC VELOCITY FLOW MAP  Result Date: 03/20/2023 CLINICAL DATA:  55F presents with MINOCA EXAM: CARDIAC MRI TECHNIQUE: The patient was scanned on a 1.5 Tesla Siemens magnet. A dedicated cardiac coil was used. Functional imaging was done using Fiesta sequences. 2,3, and 4 chamber views were done to assess for RWMA's. Modified Simpson's rule using a short axis stack was used to calculate an ejection fraction on a dedicated work [employer], [employer]. The patient received 9 cc of Gadavist. After 10 minutes inversion recovery sequences were used to assess for infiltration and scar tissue. Phase contrast velocity mapping was performed above the aortic and pulmonic valves CONTRAST:  9 cc  of Gadavist FINDINGS: Left ventricle: -Normal size -Mild hypertrophy -Mild systolic dysfunction.  Apical akinesis -LV apical thrombus measures 12mm x 10mm x 6mm -Elevated ECV (30%) -Elevated T2 values in apical segments, measuring up to 63ms in apical septum -No LGE LV EF:  41% (Normal 52-79%) Absolute volumes: LV EDV: (Normal 78-167 mL) LV ESV: 61mL (Normal 21-64 mL) LV SV: 42mL (Normal 52-114 mL) CO: 2.9L/min (Normal 2.7-6.3 L/min) Indexed volumes: LV EDV: 46mL/sq-m (Normal 50-96 mL/sq-m) LV ESV: 49mL/sq-m (Normal 10-40 mL/sq-m) LV SV: 16mL/sq-m (Normal 33-64 mL/sq-m) CI: 1.7L/min/sq-m (Normal 1.9-3.9 L/min/sq-m) Right ventricle: Normal size with mild systolic dysfunction. RV EF: 46%  (Normal 52-80%) Absolute volumes: RV EDV: 94mL (Normal 79-175 mL) RV ESV: 51mL (Normal 13-75 mL) RV SV: 44mL (Normal 56-110 mL) CO: 3.0L/min (Normal 2.7-6 L/min) Indexed volumes: RV EDV: 61mL/sq-m (Normal 51-97 mL/sq-m) RV ESV: 57mL/sq-m (Normal 9-42 mL/sq-m) RV SV: 42mL/sq-m (Normal 35-61 mL/sq-m) CI: 1.8L/min/sq-m (Normal 1.8-3.8 L/min/sq-m) Left atrium: Normal size Right atrium: Normal size Mitral valve: Trivial regurgitation Aortic valve: Tricuspid.  Trivial  regurgitation Tricuspid valve: Trivial regurgitation Pulmonic valve: Trivial regurgitation Aorta: Normal proximal ascending aorta Pericardium: Small effusion IMPRESSION: 1. Findings consistent with Takotsubo cardiomyopathy, including akinesis of LV apical segments, no late gadolinium enhancement, and elevated T2 values suggesting myocardial edema in apical segments. 2.  LV apical thrombus measures 12mm x 10mm x 6mm 3. Normal LV size, mild hypertrophy, and mild systolic dysfunction (EF 41%) 4.  Normal RV size with mild systolic dysfunction (EF 46%) Electronically Signed   By: Epifanio Lesches M.D.   On: 03/20/2023 22:30   ECHOCARDIOGRAM LIMITED  Result Date: 03/17/2023    ECHOCARDIOGRAM LIMITED REPORT   Patient Name:   Mikayla Jenkins Date of Exam: 03/17/2023 Medical Rec #:  [id number]    Height:       65.0 in Accession #:    [id number]   Weight:       138.0 lb Date of Birth:  03/15/1946   BSA:          1.690 m Patient Age:    73 years     BP:           136/87 mmHg Patient Gender: F            HR:           72 bpm. Exam Location:  Inpatient Procedure: Limited Echo and Limited Color Doppler Indications:    NSTEMI I21.4  History:        Patient has prior history of Echocardiogram examinations, most                 recent 12/23/2022. Previous Myocardial Infarction; Risk                 Factors:Hypertension, Sleep Apnea, Diabetes and Dyslipidemia.                 CKD, stage 3.  Sonographer:    Lucendia Herrlich Referring Phys: [id number] MICHAEL COSIANO  IMPRESSIONS  1. All mid-to-apical LV segments are akinetic. Wall motion pattern most consistent with takostubo cardiomyopathy with normal coronaries noted on cath from 03/16/23. Left ventricular ejection fraction, by estimation, is 25 to 30%. The left ventricle has severely decreased function. The left ventricle demonstrates regional wall motion abnormalities (see scoring diagram/findings for description). Left ventricular diastolic parameters are consistent with Grade I diastolic dysfunction (impaired relaxation).  2. Right ventricular systolic function is normal. The right ventricular size is normal. There is normal pulmonary artery systolic pressure. The estimated right ventricular systolic pressure is 28.6 mmHg.  3. Left atrial size was severely dilated.  4. The mitral valve is normal in structure. Trivial mitral valve regurgitation.  5. The aortic valve is tricuspid. There is mild calcification of the aortic valve. There is mild thickening of the aortic valve. Aortic valve regurgitation is not visualized.  6. The inferior vena cava is normal in size with greater than 50% respiratory variability, suggesting right atrial pressure of 3 mmHg. FINDINGS  Left Ventricle: All mid-to-apical LV segments are akinetic. Wall motion pattern most consistent with takostubo cardiomyopathy with normal coronaries noted on cath from 03/16/23. Left ventricular ejection fraction, by estimation, is 25 to 30%. The left ventricle has severely decreased function. The left ventricle demonstrates regional wall motion abnormalities. The left ventricular internal cavity size was normal in size. There is no left ventricular hypertrophy. Left ventricular diastolic parameters are consistent with Grade I diastolic dysfunction (impaired relaxation). Right Ventricle: The right ventricular size is normal. Right ventricular systolic function is normal. There is  normal pulmonary artery systolic pressure. The tricuspid regurgitant velocity is 2.53 m/s,  and with an assumed right atrial pressure of 3 mmHg,  the estimated right ventricular systolic pressure is 28.6 mmHg. Left Atrium: Left atrial size was severely dilated. Right Atrium: Right atrial size was normal in size. Mitral Valve: The mitral valve is normal in structure. Trivial mitral valve regurgitation. Tricuspid Valve: The tricuspid valve is normal in structure. Tricuspid valve regurgitation is trivial. Aortic Valve: The aortic valve is tricuspid. There is mild calcification of the aortic valve. There is mild thickening of the aortic valve. Aortic valve regurgitation is not visualized. Aortic valve peak gradient measures 3.3 mmHg. Pulmonic Valve: The pulmonic valve was normal in structure. Pulmonic valve regurgitation is trivial. Aorta: The aortic root is normal in size and structure. Venous: The inferior vena cava is normal in size with greater than 50% respiratory variability, suggesting right atrial pressure of 3 mmHg. IAS/Shunts: There is right bowing of the interatrial septum, suggestive of elevated left atrial pressure. LEFT VENTRICLE PLAX 2D LVIDd:         4.50 cm   Diastology LVIDs:         2.90 cm   LV e' medial:    4.82 cm/s LV PW:         0.90 cm   LV E/e' medial:  7.9 LV IVS:        1.00 cm   LV e' lateral:   10.40 cm/s LVOT diam:     2.00 cm   LV E/e' lateral: 3.7 LV SV:         40 LV SV Index:   24 LVOT Area:     3.14 cm  RIGHT VENTRICLE             IVC RV S prime:     17.70 cm/s  IVC diam: 1.30 cm TAPSE (M-mode): 3.0 cm LEFT ATRIUM             Index        RIGHT ATRIUM           Index LA diam:        3.60 cm 2.13 cm/m   RA Area:     15.10 cm LA Vol (A2C):   69.2 ml 40.96 ml/m  RA Volume:   38.60 ml  22.85 ml/m LA Vol (A4C):   83.3 ml 49.30 ml/m LA Biplane Vol: 79.1 ml 46.82 ml/m  AORTIC VALVE                 PULMONIC VALVE AV Area (Vmax): 2.32 cm     PR End Diast Vel: 9.24 msec AV Vmax:        90.30 cm/s AV Peak Grad:   3.3 mmHg LVOT Vmax:      66.77 cm/s LVOT Vmean:     41.700 cm/s  LVOT VTI:       0.129 m  AORTA Ao Root diam: 3.50 cm Ao Asc diam:  3.60 cm MITRAL VALVE               TRICUSPID VALVE MV Area (PHT): 5.13 cm    TR Peak grad:   25.6 mmHg MV Decel Time: 148 msec    TR Vmax:        253.00 cm/s MV E velocity: 38.00 cm/s MV A velocity: 63.30 cm/s  SHUNTS MV E/A ratio:  0.60        Systemic VTI:  0.13 m  Systemic Diam: 2.00 cm Laurance Flatten MD Electronically signed by Laurance Flatten MD Signature Date/Time: 03/17/2023/2:32:16 PM    Final    CARDIAC CATHETERIZATION  Result Date: 03/17/2023 Angiographically normal coronary artery disease with no obvious culprit lesion. Codominant system. Difficult to image left ventriculography, would recommend 2D echo but appears to be normal EF. Normal EDP. Plan: Will need to consider different etiology for troponin elevation however for now we will go ahead and treat as though this could have been ACS with a lesion that cleared with nitroglycerin, aspirin and heparin.. For now we will treat with DAPT and 40 hours of IV heparin until other etiology is determined. Check 2D echo to better assess EF and consider cardiac MRI as there is concern for troponin elevation and her discomfort being related to myocarditis. Will continue most home medications but hold the thiazide diuretic and clonidine nightly. Sliding-scale insulin. Standard labs checked with A1c, lipid panel etc. Anticipate relatively short stay depending additional evaluation.   DG Thoracic Spine 2 View  Result Date: 03/16/2023 CLINICAL DATA:  Chest pain upper back pain EXAM: THORACIC SPINE 2 VIEWS COMPARISON:  None Available. FINDINGS: Scoliosis of the thoracolumbar spine. Vertebral body heights are grossly maintained. Multilevel degenerative osteophytes. IMPRESSION: Scoliosis and degenerative changes. Electronically Signed   By: Jasmine Pang M.D.   On: 03/16/2023 21:23   DG Chest 2 View  Result Date: 03/16/2023 CLINICAL DATA:  Chest pain EXAM: CHEST  - 2 VIEW COMPARISON:  01/06/2023 FINDINGS: The heart size and mediastinal contours are within normal limits. Both lungs are clear. Scoliosis. Aortic atherosclerosis. Minimal scarring left lung base. IMPRESSION: No active cardiopulmonary disease. Electronically Signed   By: Jasmine Pang M.D.   On: 03/16/2023 21:22   Disposition   Pt is being discharged home today in good condition.  Follow-up Plans & Appointments     Follow-up Information     [facility]. Go in 19 day(s).   Specialty: Cardiology Why: Hospital follow up 04/06/2023 @ 2 pm PLEASE bring a current medication list to appointment FREE valet parking, Entrance C, off [employer] information: [address] [id number] mc [place] [address] [phone]        [facility] Follow up.   Specialty: Home Health Services Why: Registered Nurse and Social Worker-office to call with visit times. Contact information: [address] [place] [address] [phone]         Sharlene Dory, NP Follow up on 04/04/2023.   Specialty: Cardiology Why: at 8:30am for your follow up appt with Dr. Marya Landry' NP Idelle Jo information: [address] [place] [address] [phone]                Discharge Instructions     AMB referral to Phase II Cardiac Rehabilitation   Complete by: As directed    Diagnosis: NSTEMI   After initial evaluation and assessments completed: Virtual Based Care may be provided alone or in conjunction with Phase 2 Cardiac Rehab based on patient barriers.: Yes   Intensive Cardiac Rehabilitation (ICR) [facility] location only OR Traditional Cardiac Rehabilitation (TCR) *If criteria for ICR are not met will enroll in TCR [facility] only): Yes   Call MD for:  redness, tenderness, or signs of infection (pain, swelling, redness, odor or green/yellow discharge around incision site)   Complete by: As  directed    Diet - low sodium heart healthy   Complete by: As  directed    Discharge instructions   Complete by: As directed    Radial Site Care Refer to this sheet in the next few weeks. These instructions provide you with information on caring for yourself after your procedure. Your caregiver may also give you more specific instructions. Your treatment has been planned according to current medical practices, but problems sometimes occur. Call your caregiver if you have any problems or questions after your procedure. HOME CARE INSTRUCTIONS You may shower the day after the procedure. Remove the bandage (dressing) and gently wash the site with plain soap and water. Gently pat the site dry.  Do not apply powder or lotion to the site.  Do not submerge the affected site in water for 3 to 5 days.  Inspect the site at least twice daily.  Do not flex or bend the affected arm for 24 hours.  No lifting over 5 pounds (2.3 kg) for 5 days after your procedure.  Do not drive home if you are discharged the same day of the procedure. Have someone else drive you.  You may drive 24 hours after the procedure unless otherwise instructed by your caregiver.  What to expect: Any bruising will usually fade within 1 to 2 weeks.  Blood that collects in the tissue (hematoma) may be painful to the touch. It should usually decrease in size and tenderness within 1 to 2 weeks.  SEEK IMMEDIATE MEDICAL CARE IF: You have unusual pain at the radial site.  You have redness, warmth, swelling, or pain at the radial site.  You have drainage (other than a small amount of blood on the dressing).  You have chills.  You have a fever or persistent symptoms for more than 72 hours.  You have a fever and your symptoms suddenly get worse.  Your arm becomes pale, cool, tingly, or numb.  You have heavy bleeding from the site. Hold pressure on the site.   Face-to-face encounter (required for Medicare/Medicaid patients)   Complete by: As  directed    I Laverda Page certify that this patient is under my care and that I, or a nurse practitioner or physician's assistant working with me, had a face-to-face encounter that meets the physician face-to-face encounter requirements with this patient on 03/22/2023. The encounter with the patient was in whole, or in part for the following medical condition(s) which is the primary reason for home health care (List medical condition): Heart failure, stress cardiomyopathy   The encounter with the patient was in whole, or in part, for the following medical condition, which is the primary reason for home health care: Heart failure, stress cardiomyopathy   I certify that, based on my findings, the following services are medically necessary home health services: Nursing   Reason for Medically Necessary Home Health Services: Skilled Nursing- Changes in Medication/Medication Management   My clinical findings support the need for the above services: OTHER SEE COMMENTS   Further, I certify that my clinical findings support that this patient is homebound due to: Mental confusion   Home Health   Complete by: As directed    To provide the following care/treatments:  RN Social work     Increase activity slowly   Complete by: As directed         Discharge Medications   Allergies as of 03/22/2023       Reactions   Metformin And Related Other (See Comments)   CKD stage 4        Medication List  STOP taking these medications    amLODipine 10 MG tablet Commonly known as: NORVASC   benazepril 40 MG tablet Commonly known as: LOTENSIN   chlorthalidone 25 MG tablet Commonly known as: HYGROTON   cloNIDine 0.3 MG tablet Commonly known as: CATAPRES   lovastatin 40 MG tablet Commonly known as: MEVACOR Replaced by: rosuvastatin 40 MG tablet       TAKE these medications    acetaminophen 650 MG CR tablet Commonly known as: TYLENOL Take 650 mg by mouth every 8 (eight) hours as needed  for pain.   apixaban 5 MG Tabs tablet Commonly known as: ELIQUIS Take 1 tablet (5 mg total) by mouth 2 (two) times daily.   aspirin EC 81 MG tablet Take 81 mg by mouth in the morning.   blood glucose meter kit and supplies Dispense based on patient and insurance preference. Use up to four times daily as directed. (FOR ICD-10 E10.9, E11.9).   blood glucose meter kit and supplies Dispense based on patient and insurance preference  Accuchek guide meter and supplies once daily testing dx e11.9   Calcium Carbonate-Vitamin D 600-400 MG-UNIT tablet Take 1 tablet by mouth in the morning.   carvedilol 3.125 MG tablet Commonly known as: COREG Take 1 tablet (3.125 mg total) by mouth 2 (two) times daily with a meal. What changed:  medication strength See the new instructions.   glipiZIDE 2.5 MG 24 hr tablet Commonly known as: GLUCOTROL XL TAKE 1 TABLET WITH BREAKFAST EVERY TUESDAY, THURSDAY AND SATURDAY   omeprazole 40 MG capsule Commonly known as: PRILOSEC Take 1 capsule (40 mg total) by mouth daily. What changed: when to take this   rosuvastatin 40 MG tablet Commonly known as: CRESTOR Take 1 tablet (40 mg total) by mouth daily. Replaces: lovastatin 40 MG tablet           Outstanding Labs/Studies   BMET at follow up  Echo in one month  Duration of Discharge Encounter   Greater than 30 minutes including physician time.  Signed, Laverda Page, NP 03/22/2023, 11:00 AM   Patient seen and examined. Agree with assessment and plan.  Feels well.  No chest pain.  No shortness of breath.  She is euvolemic on exam.  Containing sinus rhythm.  Heart rate in the 70s-80s.  Currently on Eliquis for apical thrombus.  Need follow-up evaluation as outpatient.  Hopefully there will be recovery of LV function following her stress mediated Takotsubo cardiomyopathy.  Plan for DC today with office follow-up in 2 weeks.  Lennette Bihari, MD, [facility] 03/22/2023 11:00 AM

## 2023-03-23 ENCOUNTER — Telehealth: Payer: Self-pay | Admitting: *Deleted

## 2023-03-23 ENCOUNTER — Telehealth: Payer: Self-pay | Admitting: Internal Medicine

## 2023-03-23 ENCOUNTER — Encounter: Payer: Self-pay | Admitting: *Deleted

## 2023-03-23 NOTE — Telephone Encounter (Signed)
Pt c/o BP issue: STAT if pt c/o blurred vision, one-sided weakness or slurred speech  1. What are your last 5 BP readings? Blood pressure before medicine this morning  152/104 pulse 106, 148/102 pulse was 105, 142/93 pulse was 101, - daughter does not know what her blood pressure is at this time- she will take it when she gets home  2. Are you having any other symptoms (ex. Dizziness, headache, blurred vision, passed out)? Hot, fatigued, and  tired  3. What is your BP issue?  High blood pressure and high heart rate- Daughter wants patient  to be seen if possible asap, she is not scheduled for until hospital follow up until 04-04-23

## 2023-03-23 NOTE — Transitions of Care (Post Inpatient/ED Visit) (Signed)
03/23/2023  Name: Mikayla Jenkins MRN: [id number] DOB: [birth year]  Today's TOC FU Call Status: Today's TOC FU Call Status:: Successful TOC FU Call Competed TOC FU Call Complete Date: 03/23/23  Transition Care Management Follow-up Telephone Call Date of Discharge: 03/22/23 Discharge Facility: [facility]) Type of Discharge: Inpatient Admission Primary Inpatient Discharge Diagnosis:: NSTEMI with hypotension How have you been since you were released from the hospital?: Same (per daughter: "she is okay overall but they stopped all of her blood pressure medications andnow her BP's at home are elevated and her heart rate is up.  She is very tired after the hospital visit") Any questions or concerns?: Yes Patient Questions/Concerns:: Daughter/ Caregiver [name] is a pharmacist and is concerned that heart rates and BP's at home are now elevated post-discharged on 03/22/23: reports BP's/ HR's this morning 157/104 and 106; reports all BP's meds were stopped at discharge and carvedilol was decreased; also wants clarification if patient should be taking both Eliquis and ASA- ASA was stopped at hospital, but is still on medication list- daughter currently holding ASA until clarification is obtained Patient Questions/Concerns Addressed: Other: (sent message to cardiology providers and to PCP with request to address concerns promptly)  Items Reviewed: Did you receive and understand the discharge instructions provided?: Yes (thoroughly reviewed with patient's daughter who verbalizes good understanding of same) Medications obtained,verified, and reconciled?: Yes (Medications Reviewed) (Full medication reconciliation/ review completed; concerns identified; confirmed patient obtained/ is taking all newly Rx'd medications as instructed; self-manages medications with family's assistance) Any new allergies since your discharge?: No Dietary orders reviewed?: Yes Type of Diet Ordered:: "Healthy" Do you have support at  home?: Yes People in Home: spouse Name of Support/Comfort Primary Source: Reports essentially independent in self-care activities; spouse and daughter/ son assists as/ if needed/ indicated  Medications Reviewed Today: Medications Reviewed Today     Reviewed by Michaela Corner, RN (Registered Nurse) on 03/23/23 at 1346  Med List Status: <None>   Medication Order Taking? Sig Documenting Provider Last Dose Status Informant  acetaminophen (TYLENOL) 650 MG CR tablet [id number] Yes Take 650 mg by mouth every 8 (eight) hours as needed for pain. [provider] Taking Active Family Member  apixaban (ELIQUIS) 5 MG TABS tablet [id number] Yes Take 1 tablet (5 mg total) by mouth 2 (two) times daily. Marykay Lex, MD Taking Active   aspirin EC 81 MG tablet [id number] No Take 81 mg by mouth in the morning.  Patient not taking: Reported on 03/23/2023   [provider] Not Taking Active Family Member           Med Note Otelia Limes Mar 23, 2023  1:45 PM) 03/23/23: reports during [facility] that daughter (who is pharmacist) is currently holding post-hospital discharge because patient is also on Eliquis- was told at time of discharge that ASA was to be stopped- but noted it is still on her current active medication list- cardiology-- please address  blood glucose meter kit and supplies [id number] Yes Dispense based on patient and insurance preference. Use up to four times daily as directed. (FOR ICD-10 E10.9, E11.9). Kerri Perches, MD Taking Active Family Member  blood glucose meter kit and supplies [id number] Yes Dispense based on patient and insurance preference  Accuchek guide meter and supplies once daily testing dx e11.9 Kerri Perches, MD Taking Active Family Member  Calcium Carbonate-Vitamin D 600-400 MG-UNIT tablet [id number] Yes Take 1 tablet by mouth in the morning. [provider] Taking Active Family Member  carvedilol (COREG) 3.125 MG tablet [id number] Yes Take 1 tablet  (3.125 mg total) by mouth 2 (two) times daily with a meal. Marykay Lex, MD Taking Active            Med Note Otelia Limes Mar 23, 2023  1:46 PM) 03/23/23: reports during [facility] call that all BP meds were stopped and carvedilol dose was decreased at time of hospital discharge on 03/22/23- patient experiencing increased BP's and HR's at home post- hospital discharge: cardiology: please address  glipiZIDE (GLUCOTROL XL) 2.5 MG 24 hr tablet [id number] Yes TAKE 1 TABLET WITH BREAKFAST EVERY TUESDAY, THURSDAY AND SATURDAY Kerri Perches, MD Taking Active Family Member  omeprazole (PRILOSEC) 40 MG capsule [id number] Yes Take 1 capsule (40 mg total) by mouth daily.  Patient taking differently: Take 40 mg by mouth in the morning.   Dolores Frame, MD Taking Active Family Member  rosuvastatin (CRESTOR) 40 MG tablet [id number] Yes Take 1 tablet (40 mg total) by mouth daily. Marykay Lex, MD Taking Active             Home Care and Equipment/Supplies: Were Home Health Services Ordered?: Yes Name of Home Health Agency:: [facility]: [phone] Has Agency set up a time to come to your home?: No EMR reviewed for Home Health Orders: Orders present/patient has not received call (refer to CM for follow-up) (provided and confirmed phone number to [facility] home health) Any new equipment or medical supplies ordered?: No  Functional Questionnaire: Do you need assistance with bathing/showering or dressing?: No Do you need assistance with meal preparation?: No Do you need assistance with eating?: No Do you have difficulty maintaining continence: No Do you need assistance with getting out of bed/getting out of a chair/moving?: No Do you have difficulty managing or taking your medications?: Yes (family manages medications and placed in pill box, then patient takes independently)  Follow up appointments reviewed: PCP Follow-up appointment confirmed?: Yes (care coordination outreach in  real-time with scheduling care guide to successfully schedule hospital follow up PCP appointment 03/29/23) Date of PCP follow-up appointment?: 03/29/23 Follow-up Provider: PCP Specialist Hospital Follow-up appointment confirmed?: Yes Date of Specialist follow-up appointment?: 04/04/23 Follow-Up Specialty Provider:: cardiology Do you need transportation to your follow-up appointment?: No Do you understand care options if your condition(s) worsen?: Yes-patient verbalized understanding  SDOH Interventions Today    Flowsheet Row Most Recent Value  SDOH Interventions   Food Insecurity Interventions Intervention Not Indicated  Transportation Interventions Intervention Not Indicated  [family provides transportation]      TOC Interventions Today    Flowsheet Row Most Recent Value  TOC Interventions   TOC Interventions Discussed/Reviewed TOC Interventions Discussed, Arranged PCP follow up within 7 days/Care Guide scheduled  [provided my direct contact information should questions/ concerns/ needs arise post-TOC call, prior to RN CM telephone visit]      Interventions Today    Flowsheet Row Most Recent Value  Chronic Disease   Chronic disease during today's visit Other  [NSTEMI]  General Interventions   General Interventions Discussed/Reviewed General Interventions Discussed, Doctor Visits, Durable Medical Equipment (DME), Referral to Nurse, Communication with  Doctor Visits Discussed/Reviewed Doctor Visits Discussed, Specialist, PCP, Doctor Visits Reviewed  [reviewed last cardiology office visit with daughter]  Durable Medical Equipment (DME) Other  [confirmed patient not currently requiring use of assistive devices]  PCP/Specialist Visits Compliance with follow-up visit  Communication with PCP/Specialists, RN  Education Interventions   Education  Provided Provided Education  Provided Verbal Education On Other, When to see the doctor, Medication  [home health services/ role and timeline  for outreach,  need to continue monitoring BP's heart rates at home and to record/ take to provider office visits]  Nutrition Interventions   Nutrition Discussed/Reviewed Nutrition Discussed  Pharmacy Interventions   Pharmacy Dicussed/Reviewed Pharmacy Topics Discussed  [Full medication review with updating medication list in EHR per patient report and reaching out to PCP and cardiology providers around medication concerns]  Safety Interventions   Safety Discussed/Reviewed Safety Discussed      Caryl Pina, RN, BSN, CCRN Alumnus RN CM Care Coordination/ Transition of Care- [facility] [phone]: direct office

## 2023-03-23 NOTE — Telephone Encounter (Signed)
Per daughter, patient reports being fatigue and hot this morning. Below BP readings were taken before breakfast and before medications Daughter reports BP retaken about an hour ago: See below 4:05 pm 126/79 HR 80; 4:07 pm 130/80 HR 82; 4:09 pm 134/80 HR 81 Reports eating only a 1/2 banana and some oatmeal this morning and drank a 1/2 bottle of water. Reports patient did not wear CPAP last night and didn't sleep well Medications reviewed Encouraged increasing increase fluid and oral dietary intake, encouraged compliance with CPAP. Daughter request sooner appointment. Appointment changed to 04/01/2023 @3 :30 pm with [place] Ouch at [place] office. Appointment with Philis Nettle has been canceled.  Information reported to [facility] who suggest checking BP daily for one week and contact office with readings. Also advise if HR elevated tomorrow, will make increase to BB. Daughter advised if patient develops worsening symptoms, to go to ED for an evaluation. Verbalized understanding of plan.

## 2023-03-24 ENCOUNTER — Encounter: Payer: Self-pay | Admitting: *Deleted

## 2023-03-24 ENCOUNTER — Telehealth: Payer: Self-pay | Admitting: *Deleted

## 2023-03-24 NOTE — Patient Outreach (Signed)
  Care Coordination   Post-TOC Care Coordination telephone  Visit Note   03/24/2023 Name: Mikayla Jenkins MRN: [id number] DOB: 11-Jan-1946  Mikayla Jenkins is a 77 y.o. year old female who sees Kerri Perches, MD for primary care. I spoke with daughter/ caregiver [name], verified on [facility] DPR re: Mikayla Jenkins by phone today.  What matters to the patients health and wellness today?  "Thank you for the follow up call; I think I will increase the carvedilol to 6.25 mg since that is what she was on before the hospital visit; she is not up yet and I have not yet checked her BP today.  We will attend the appointment on 04/01/23 at [place] office.  I did call [facility] and they said they would be sending someone here tomorrow for the initial assessment"  SDOH assessments and interventions completed:  No Previously completed at time of TOC call on 03/23/23   Care Coordination Interventions:  Yes, provided   Interventions Today    Flowsheet Row Most Recent Value  Chronic Disease   Chronic disease during today's visit Hypertension (HTN), Other  [recent hospitalization]  General Interventions   General Interventions Discussed/Reviewed General Interventions Discussed, General Interventions Reviewed, Doctor Visits  Doctor Visits Discussed/Reviewed Specialist, Doctor Visits Discussed, PCP  PCP/Specialist Visits Compliance with follow-up visit  Communication with PCP/Specialists, RN  Education Interventions   Education Provided Provided Education  [updated patient's daughter on follow up recommendations received from Dr. Mallipeddi/ cardiology team post-TOC call yesterday]  Pharmacy Interventions   Pharmacy Dicussed/Reviewed Pharmacy Topics Discussed      Follow up plan: Follow up call scheduled for Friday 04/01/23 with RN CM Care Coordinator    Encounter Outcome:  Pt. Visit Completed   Caryl Pina, RN, BSN, CCRN Alumnus RN CM Care Coordination/ Transition of Care- [facility] Care  Management [phone]: direct office

## 2023-03-25 DIAGNOSIS — I5041 Acute combined systolic (congestive) and diastolic (congestive) heart failure: Secondary | ICD-10-CM | POA: Diagnosis not present

## 2023-03-25 DIAGNOSIS — E1169 Type 2 diabetes mellitus with other specified complication: Secondary | ICD-10-CM | POA: Diagnosis not present

## 2023-03-25 DIAGNOSIS — E785 Hyperlipidemia, unspecified: Secondary | ICD-10-CM | POA: Diagnosis not present

## 2023-03-25 DIAGNOSIS — I5181 Takotsubo syndrome: Secondary | ICD-10-CM | POA: Diagnosis not present

## 2023-03-25 DIAGNOSIS — E1122 Type 2 diabetes mellitus with diabetic chronic kidney disease: Secondary | ICD-10-CM | POA: Diagnosis not present

## 2023-03-25 DIAGNOSIS — I214 Non-ST elevation (NSTEMI) myocardial infarction: Secondary | ICD-10-CM | POA: Diagnosis not present

## 2023-03-25 DIAGNOSIS — R413 Other amnesia: Secondary | ICD-10-CM | POA: Diagnosis not present

## 2023-03-25 DIAGNOSIS — I130 Hypertensive heart and chronic kidney disease with heart failure and stage 1 through stage 4 chronic kidney disease, or unspecified chronic kidney disease: Secondary | ICD-10-CM | POA: Diagnosis not present

## 2023-03-25 DIAGNOSIS — N183 Chronic kidney disease, stage 3 unspecified: Secondary | ICD-10-CM | POA: Diagnosis not present

## 2023-03-28 ENCOUNTER — Telehealth: Payer: Self-pay | Admitting: Family Medicine

## 2023-03-28 NOTE — Telephone Encounter (Signed)
Gave verbal orders to [place].

## 2023-03-28 NOTE — Telephone Encounter (Signed)
Patient contacted regarding discharge from [facility] on 03/22/23.  Patient understands to follow up with provider [place] on 04/01/23 at 3:30 pm at [facility]. Patient understands discharge instructions? yes Patient understands medications and regiment? yes Patient understands to bring all medications to this visit? yes  Ask patient:  Are you enrolled in My Chart -yes    Discussed with daughter

## 2023-03-28 NOTE — Telephone Encounter (Signed)
Mikayla Jenkins called from [facility] home health seen last week for skill nursing care and add physical therapy frequency 1 week 4 and 2 month 1 and 2 prn.   Call back # for verbal [phone].

## 2023-03-29 ENCOUNTER — Ambulatory Visit (INDEPENDENT_AMBULATORY_CARE_PROVIDER_SITE_OTHER): Payer: Medicare HMO | Admitting: Family Medicine

## 2023-03-29 ENCOUNTER — Encounter: Payer: Self-pay | Admitting: Family Medicine

## 2023-03-29 VITALS — BP 130/76 | HR 80 | Ht 65.0 in | Wt 130.1 lb

## 2023-03-29 DIAGNOSIS — E1121 Type 2 diabetes mellitus with diabetic nephropathy: Secondary | ICD-10-CM | POA: Diagnosis not present

## 2023-03-29 DIAGNOSIS — I5041 Acute combined systolic (congestive) and diastolic (congestive) heart failure: Secondary | ICD-10-CM

## 2023-03-29 DIAGNOSIS — E1169 Type 2 diabetes mellitus with other specified complication: Secondary | ICD-10-CM

## 2023-03-29 DIAGNOSIS — R6881 Early satiety: Secondary | ICD-10-CM | POA: Diagnosis not present

## 2023-03-29 DIAGNOSIS — E785 Hyperlipidemia, unspecified: Secondary | ICD-10-CM | POA: Diagnosis not present

## 2023-03-29 DIAGNOSIS — I214 Non-ST elevation (NSTEMI) myocardial infarction: Secondary | ICD-10-CM | POA: Diagnosis not present

## 2023-03-29 DIAGNOSIS — I5181 Takotsubo syndrome: Secondary | ICD-10-CM | POA: Diagnosis not present

## 2023-03-29 DIAGNOSIS — I513 Intracardiac thrombosis, not elsewhere classified: Secondary | ICD-10-CM

## 2023-03-29 DIAGNOSIS — N179 Acute kidney failure, unspecified: Secondary | ICD-10-CM | POA: Diagnosis not present

## 2023-03-29 DIAGNOSIS — Z7689 Persons encountering health services in other specified circumstances: Secondary | ICD-10-CM | POA: Insufficient documentation

## 2023-03-29 DIAGNOSIS — I10 Essential (primary) hypertension: Secondary | ICD-10-CM

## 2023-03-29 NOTE — Assessment & Plan Note (Signed)
Rept chem 7 and EGFr for f/u, had improved somewhat during hospitalization

## 2023-03-29 NOTE — Assessment & Plan Note (Signed)
DASH diet and commitment to daily physical activity for a minimum of 30 minutes discussed and encouraged, as a part of hypertension management. The importance of attaining a healthy weight is also discussed.     03/29/2023    4:05 PM 03/29/2023    4:04 PM 03/29/2023    3:33 PM 03/29/2023    3:31 PM 03/22/2023    3:16 AM 03/22/2023   12:32 AM 03/21/2023    7:40 PM  BP/Weight  Systolic BP 130 134 160 164 120 133 134  Diastolic BP 76 78 80 89 66 64 70  Wt. (Lbs)    130.12     BMI    21.65 kg/m2        Controlled, no change in medication

## 2023-03-29 NOTE — Assessment & Plan Note (Signed)
Normal coronary arteries in 03/2023

## 2023-03-29 NOTE — Assessment & Plan Note (Signed)
10 pound weight loss in past month, but acutely ill with prolonged hospitalization x 1 week

## 2023-03-29 NOTE — Progress Notes (Signed)
Mikayla Jenkins     MRN: [id number]      DOB: 22-Mar-1946  Chief Complaint  Patient presents with   Follow-up    Follow up from hospital   Pt monitor 148/72, HR of 72 left Right :pt monitor: 148/66, HR  of 66  HPI Mikayla Jenkins is here for [facility] visit following hospitalization from 5/29 to 03/22/2023, new dx of  , acute combined heart failure and takotsubo cardiomyopathy, also apical mural thrombus. Hospital course reviewed  with patient and her daughter Main complaint/concern at this visit is blood pressure noted to be elevated in the morning and after taking medication later in the day, home BP readings are good, also HR between 70 and 80.Will take meds 12 hours apart for better control and f/u with Cardiology in 3 days, likely to hold off on increasing coreg dose now, I had good readings at the visit , though pt's cuff had readings slightly higher C/o poor appetite and fatigue, however recovering from  recent hospitalization Denies polyuria, polydipsia, blurred vision , or hypoglycemic episodes. Not checking blood sugar regularly but will start Denies any bleeding with epiquis which is new, and blood pressure controlled on much fewer medications currently  ROS See HPI   PE  BP 130/76   Pulse 80   Ht 5\' 5"  (1.651 m)   Wt 130 lb 1.9 oz (59 kg)   SpO2 94%   BMI 21.65 kg/m  Alert and oriented, appears tired   HEENT: No facial asymmetry, EOMI,     Neck supple .  Chest: Clear to auscultation bilaterally.  CVS: S1, S2 no murmurs, no S3.Regular rate.  ABD: Soft non tender.   Ext: No edema    Skin: Intact, no ulcerations or rash noted.  Psych: Good eye contact, normal affect. Memory intact not anxious or depressed appearing.  CNS: CN 2-12 intact, power,  normal throughout.no focal deficits noted.   Assessment & Plan  Acute combined systolic and diastolic heart failure (HCC) Denies PND, orthopnea or leg swelling, c/o fatigue and poor appetite, close f/u post recent  hospitalization with this new diagnosis in 3 days, no med change  AKI (acute kidney injury) (HCC) Rept chem 7 and EGFr for f/u, had improved somewhat during hospitalization  HTN (hypertension) DASH diet and commitment to daily physical activity for a minimum of 30 minutes discussed and encouraged, as a part of hypertension management. The importance of attaining a healthy weight is also discussed.     03/29/2023    4:05 PM 03/29/2023    4:04 PM 03/29/2023    3:33 PM 03/29/2023    3:31 PM 03/22/2023    3:16 AM 03/22/2023   12:32 AM 03/21/2023    7:40 PM  BP/Weight  Systolic BP 130 134 160 164 120 133 134  Diastolic BP 76 78 80 89 66 64 70  Wt. (Lbs)    130.12     BMI    21.65 kg/m2        Controlled, no change in medication   Early satiety 10 pound weight loss in past month, but acutely ill with prolonged hospitalization x 1 week  Takotsubo cardiomyopathy Close cardiology f/u in place new dx in 02/2023, required hospitalization with acute diastolic and systolic heart failure  Type 2 diabetes mellitus with nephropathy (HCC) Controlled, no change in medication, check daily  FBG for next 4 weeks then at least 3 times weekly if well controlled Updated lab needed at/ before next visit. Curently controlled  Apical mural thrombus New dx in 02/2023, currently on eliquis, initial plan is 45 days then re asess the need for ongoing eliquis  Anemia in chronic kidney disease C/o fatigue re check CBC  Hyperlipidemia associated with type 2 diabetes mellitus (HCC) Hyperlipidemia:Low fat diet discussed and encouraged.   Lipid Panel  Lab Results  Component Value Date   CHOL 207 (H) 03/16/2023   HDL 107 03/16/2023   LDLCALC 85 03/16/2023   TRIG 75 03/16/2023   CHOLHDL 1.9 03/16/2023     New start of statin rept hepatic panel, had elevated liver enzyme  NSTEMI (non-ST elevated myocardial infarction) (HCC) Normal coronary arteries in 03/2023  Encounter for support and coordination  of transition of care Patient in for follow up of recent hospitalization. Discharge summary, and laboratory and radiology data are reviewed, and any questions or concerns  are discussed. Specific issues requiring follow up are specifically addressed.

## 2023-03-29 NOTE — Patient Instructions (Addendum)
F/U week of September 2, cancel August appt, call if you need me sooner  Please start testing blood sugar fasting 5 days per week, if getting good numbers you may reduce to 3 days per week, goal range is 90 to 130  Try to take carvedilol 12 hours apart for best efficacy, same as you do the eliquis  We will be on the lookout for HHN from [facility] and I will sign off on paperwork  Fasting lipid, cmp mad erGFr and HBA1C  August 29 or shortly after  Labs today, cBC, cmp and EGFR  Thanks for choosing [facility], we consider it a privelige to serve you.

## 2023-03-29 NOTE — Assessment & Plan Note (Signed)
Patient in for follow up of recent hospitalization. Discharge summary, and laboratory and radiology data are reviewed, and any questions or concerns  are discussed. Specific issues requiring follow up are specifically addressed.  

## 2023-03-29 NOTE — Assessment & Plan Note (Addendum)
New dx in 02/2023, currently on eliquis, initial plan is 45 days then re asess the need for ongoing eliquis

## 2023-03-29 NOTE — Assessment & Plan Note (Signed)
Controlled, no change in medication, check daily  FBG for next 4 weeks then at least 3 times weekly if well controlled Updated lab needed at/ before next visit. Curently controlled

## 2023-03-29 NOTE — Assessment & Plan Note (Signed)
Hyperlipidemia:Low fat diet discussed and encouraged.   Lipid Panel  Lab Results  Component Value Date   CHOL 207 (H) 03/16/2023   HDL 107 03/16/2023   LDLCALC 85 03/16/2023   TRIG 75 03/16/2023   CHOLHDL 1.9 03/16/2023     New start of statin rept hepatic panel, had elevated liver enzyme

## 2023-03-29 NOTE — Assessment & Plan Note (Signed)
Close cardiology f/u in place new dx in 02/2023, required hospitalization with acute diastolic and systolic heart failure

## 2023-03-29 NOTE — Assessment & Plan Note (Signed)
C/o fatigue re check CBC

## 2023-03-29 NOTE — Assessment & Plan Note (Signed)
Denies PND, orthopnea or leg swelling, c/o fatigue and poor appetite, close f/u post recent hospitalization with this new diagnosis in 3 days, no med change

## 2023-03-30 LAB — CBC
Hematocrit: 37.7 % (ref 34.0–46.6)
Hemoglobin: 12.6 g/dL (ref 11.1–15.9)
MCH: 30.6 pg (ref 26.6–33.0)
MCHC: 33.4 g/dL (ref 31.5–35.7)
MCV: 92 fL (ref 79–97)
Platelets: 228 10*3/uL (ref 150–450)
RBC: 4.12 x10E6/uL (ref 3.77–5.28)
RDW: 12.3 % (ref 11.7–15.4)
WBC: 4.9 10*3/uL (ref 3.4–10.8)

## 2023-03-30 LAB — CMP14+EGFR
ALT: 23 IU/L (ref 0–32)
AST: 39 IU/L (ref 0–40)
Albumin/Globulin Ratio: 2.4
Albumin: 4.8 g/dL (ref 3.8–4.8)
Alkaline Phosphatase: 98 IU/L (ref 44–121)
BUN/Creatinine Ratio: 10 — ABNORMAL LOW (ref 12–28)
BUN: 14 mg/dL (ref 8–27)
Bilirubin Total: 0.6 mg/dL (ref 0.0–1.2)
CO2: 22 mmol/L (ref 20–29)
Calcium: 9.8 mg/dL (ref 8.7–10.3)
Chloride: 95 mmol/L — ABNORMAL LOW (ref 96–106)
Creatinine, Ser: 1.38 mg/dL — ABNORMAL HIGH (ref 0.57–1.00)
Globulin, Total: 2 g/dL (ref 1.5–4.5)
Glucose: 98 mg/dL (ref 70–99)
Potassium: 3.8 mmol/L (ref 3.5–5.2)
Sodium: 135 mmol/L (ref 134–144)
Total Protein: 6.8 g/dL (ref 6.0–8.5)
eGFR: 39 mL/min/{1.73_m2} — ABNORMAL LOW (ref >59)

## 2023-03-31 DIAGNOSIS — I5041 Acute combined systolic (congestive) and diastolic (congestive) heart failure: Secondary | ICD-10-CM | POA: Diagnosis not present

## 2023-03-31 DIAGNOSIS — I5181 Takotsubo syndrome: Secondary | ICD-10-CM | POA: Diagnosis not present

## 2023-03-31 DIAGNOSIS — I214 Non-ST elevation (NSTEMI) myocardial infarction: Secondary | ICD-10-CM | POA: Diagnosis not present

## 2023-03-31 DIAGNOSIS — E1122 Type 2 diabetes mellitus with diabetic chronic kidney disease: Secondary | ICD-10-CM | POA: Diagnosis not present

## 2023-03-31 DIAGNOSIS — E785 Hyperlipidemia, unspecified: Secondary | ICD-10-CM | POA: Diagnosis not present

## 2023-03-31 DIAGNOSIS — R413 Other amnesia: Secondary | ICD-10-CM | POA: Diagnosis not present

## 2023-03-31 DIAGNOSIS — N183 Chronic kidney disease, stage 3 unspecified: Secondary | ICD-10-CM | POA: Diagnosis not present

## 2023-03-31 DIAGNOSIS — E1169 Type 2 diabetes mellitus with other specified complication: Secondary | ICD-10-CM | POA: Diagnosis not present

## 2023-03-31 DIAGNOSIS — I130 Hypertensive heart and chronic kidney disease with heart failure and stage 1 through stage 4 chronic kidney disease, or unspecified chronic kidney disease: Secondary | ICD-10-CM | POA: Diagnosis not present

## 2023-04-01 ENCOUNTER — Ambulatory Visit: Payer: Medicare HMO | Attending: Student | Admitting: Student

## 2023-04-01 ENCOUNTER — Ambulatory Visit: Payer: Self-pay | Admitting: *Deleted

## 2023-04-01 ENCOUNTER — Encounter: Payer: Self-pay | Admitting: Student

## 2023-04-01 VITALS — BP 130/78 | HR 73 | Ht 65.5 in | Wt 132.0 lb

## 2023-04-01 DIAGNOSIS — N183 Chronic kidney disease, stage 3 unspecified: Secondary | ICD-10-CM | POA: Diagnosis not present

## 2023-04-01 DIAGNOSIS — E1169 Type 2 diabetes mellitus with other specified complication: Secondary | ICD-10-CM | POA: Diagnosis not present

## 2023-04-01 DIAGNOSIS — I130 Hypertensive heart and chronic kidney disease with heart failure and stage 1 through stage 4 chronic kidney disease, or unspecified chronic kidney disease: Secondary | ICD-10-CM | POA: Diagnosis not present

## 2023-04-01 DIAGNOSIS — I5181 Takotsubo syndrome: Secondary | ICD-10-CM

## 2023-04-01 DIAGNOSIS — E785 Hyperlipidemia, unspecified: Secondary | ICD-10-CM | POA: Diagnosis not present

## 2023-04-01 DIAGNOSIS — I10 Essential (primary) hypertension: Secondary | ICD-10-CM

## 2023-04-01 DIAGNOSIS — I214 Non-ST elevation (NSTEMI) myocardial infarction: Secondary | ICD-10-CM | POA: Diagnosis not present

## 2023-04-01 DIAGNOSIS — I513 Intracardiac thrombosis, not elsewhere classified: Secondary | ICD-10-CM

## 2023-04-01 DIAGNOSIS — N1831 Chronic kidney disease, stage 3a: Secondary | ICD-10-CM | POA: Diagnosis not present

## 2023-04-01 DIAGNOSIS — R413 Other amnesia: Secondary | ICD-10-CM | POA: Diagnosis not present

## 2023-04-01 DIAGNOSIS — I5021 Acute systolic (congestive) heart failure: Secondary | ICD-10-CM | POA: Diagnosis not present

## 2023-04-01 DIAGNOSIS — E1122 Type 2 diabetes mellitus with diabetic chronic kidney disease: Secondary | ICD-10-CM | POA: Diagnosis not present

## 2023-04-01 DIAGNOSIS — I5041 Acute combined systolic (congestive) and diastolic (congestive) heart failure: Secondary | ICD-10-CM | POA: Diagnosis not present

## 2023-04-01 MED ORDER — CARVEDILOL 6.25 MG PO TABS: 6.2500 mg | ORAL_TABLET | Freq: Two times a day (BID) | ORAL | 3 refills | Status: DC

## 2023-04-01 NOTE — Progress Notes (Signed)
Cardiology Office Note    Date:  04/01/2023  ID:  Mikayla Jenkins, DOB 02-01-1946, MRN [id number] Cardiologist: Marjo Bicker, MD    History of Present Illness:    Mikayla Jenkins is a 77 y.o. female with past medical history of HTN, HLD, Type 2 DM, Stage 3 CKD, OSA (on CPAP) and newly diagnosed HFrEF/Takotsubo Cardiomyopathy with LV thrombus who presents to the office today for hospital follow-up.   She was most recently admitted to [facility] from 5/29 - 03/22/2023 for evaluation of chest pain and found to have an NSTEMI with Hs troponin values peaking at 3099. Given EKG changes, she underwent a cardiac catheterization the day of admission which showed angiographically normal coronary arteries with no culprit lesion. Limited echocardiogram showed a reduced EF of 25 to 30% with a mid to apical LV segments being akinetic and consistent with Takotsubo cardiomyopathy. Cardiac MRI was performed later that admission which confirmed Takotsubo cardiomyopathy and she was also found to have an LV apical thrombus measuring 12 mm x 10 mm x 6 mm. EF was estimated at 41% by cMRI. Was started on Eliquis for anticoagulation. She had been started on GDMT for her cardiomyopathy but developed hypotension and AKI with this (was on Jardiance and Avapro with PTA Chlorthalidone being discontinued). Her creatinine did peak at 2.09 but had improved to 1.27 at the time of discharge. She was discharged on ASA 81 mg daily (listed in notes not to take), Eliquis 5 mg twice daily, Coreg 3.125 mg twice daily and Crestor 40 mg daily. It was recommended to titrate GDMT as an outpatient if BP allowed and plan for a follow-up echocardiogram in 1 month for reassessment.  In talking with the patient and her daughter today, she reports she is feeling significantly better as compared to her recent hospitalization. Still has some fatigue but denies any recent chest pain or dyspnea on exertion. No recent orthopnea, PND or pitting edema. Uses  her CPAP on a nightly basis. Her daughter has been keeping track of her BP/heart rate and is wondering if Coreg needs to be titrated as her SBP has been in the 150's at times. Her daughter is hesitant about her being on an SGLT2 inhibitor given her variable PO intake and concern for dehydration.  Studies Reviewed:   EKG: EKG is not ordered today.   Cardiac Catheterization: 02/2023 Angiographically normal coronary artery disease with no obvious culprit lesion. Codominant system. Difficult to image left ventriculography, would recommend 2D echo but appears to be normal EF. Normal EDP.      Plan: Will need to consider different etiology for troponin elevation however for now we will go ahead and treat as though this could have been ACS with a lesion that cleared with nitroglycerin, aspirin and heparin.. For now we will treat with DAPT and 40 hours of IV heparin until other etiology is determined. Check 2D echo to better assess EF and consider cardiac MRI as there is concern for troponin elevation and her discomfort being related to myocarditis. Will continue most home medications but hold the thiazide diuretic and clonidine nightly. Sliding-scale insulin. Standard labs checked with A1c, lipid panel etc.     Anticipate relatively short stay depending additional evaluation.  Limited Echo: 02/2023 IMPRESSIONS     1. All mid-to-apical LV segments are akinetic. Wall motion pattern most  consistent with takostubo cardiomyopathy with normal coronaries noted on  cath from 03/16/23. Left ventricular ejection fraction, by estimation, is  25 to  30%. The left ventricle has  severely decreased function. The left ventricle demonstrates regional wall  motion abnormalities (see scoring diagram/findings for description). Left  ventricular diastolic parameters are consistent with Grade I diastolic  dysfunction (impaired relaxation).   2. Right ventricular systolic function is normal. The right  ventricular  size is normal. There is normal pulmonary artery systolic pressure. The  estimated right ventricular systolic pressure is 28.6 mmHg.   3. Left atrial size was severely dilated.   4. The mitral valve is normal in structure. Trivial mitral valve  regurgitation.   5. The aortic valve is tricuspid. There is mild calcification of the  aortic valve. There is mild thickening of the aortic valve. Aortic valve  regurgitation is not visualized.   6. The inferior vena cava is normal in size with greater than 50%  respiratory variability, suggesting right atrial pressure of 3 mmHg.    Cardiac MRI: 03/2023 IMPRESSION: 1. Findings consistent with Takotsubo cardiomyopathy, including akinesis of LV apical segments, no late gadolinium enhancement, and elevated T2 values suggesting myocardial edema in apical segments.   2.  LV apical thrombus measures 12mm x 10mm x 6mm   3. Normal LV size, mild hypertrophy, and mild systolic dysfunction (EF 41%)   4.  Normal RV size with mild systolic dysfunction (EF 46%)    Risk Assessment/Calculations:   STOP-Bang Score:  3        Physical Exam:   VS:  BP 130/78   Pulse 73   Ht 5' 5.5" (1.664 m)   Wt 132 lb (59.9 kg)   SpO2 98%   BMI 21.63 kg/m    Wt Readings from Last 3 Encounters:  04/01/23 132 lb (59.9 kg)  03/29/23 130 lb 1.9 oz (59 kg)  03/17/23 138 lb 0.1 oz (62.6 kg)     GEN: Pleasant, elderly female appearing in no acute distress NECK: No JVD; No carotid bruits CARDIAC: RRR, no murmurs, rubs, gallops RESPIRATORY:  Clear to auscultation without rales, wheezing or rhonchi  ABDOMEN: Appears non-distended. No obvious abdominal masses. EXTREMITIES: No clubbing or cyanosis. No pitting edema.  Distal pedal pulses are 2+ bilaterally.   Assessment and Plan:   1. HFrEF/Takotsubo Cardiomyopathy - As outlined above, her EF was at 25 to 30% by echocardiogram and cMRI performed later that admission was consistent with Takotsubo  cardiomyopathy and EF was estimated at 41%. - She has been doing well since returning home and appears euvolemic by examination today. - The patient's daughter is a Teacher, early years/pre and does not wish for her to be on an SGLT2 inhibitor given her AKI during admission and also due to her variable PO intake and concerns for dehydration which is certainly understandable. She is also concerned about her being restarted on an ACE-I or ARB given her recent AKI. Will titrate Coreg to 6.25mg  BID for improved HR/BP control. Will arrange for a follow-up limited echo for reassessment of her EF and LV thrombus in 1 month. If her EF has not yet normalized, they are open to additional medication changes then and given her variable BP during admission, would plan for a low-dose ARB to see how she responds before switching to [facility]. As discussed during her hospitalization, she was previously intolerant to Spironolactone despite multiple attempts in the past due to worsening renal function.   2. NSTEMI - Hs Troponin did peak at 3099 during her recent admission and cardiac catheterization showed normal coronary arteries. cMRI was consistent with Takotsubo cardiomyopathy.  - No recent  anginal symptoms. Continue with risk-factor modification.   3. LV Thrombus - Diagnosed by recent cardiac MRI during her admission and she is on Eliquis for anticoagulation. Will plan for a follow-up echocardiogram in about 1 month for reassessment as recommended during her recent hospitalization.   4. History of AKI in the setting of Stage 3 CKD - Baseline creatinine 1.3 - 1.4. Peaked at 2.09 during her recent admission and had improved to 1.27 at discharge. Rechecked by her PCP on 03/29/2023 and creatinine was stable at 1.38. She is also followed by Dr. Wolfgang Phoenix.   5. HTN - BP is well-controlled at 130/78 during today's visit but has been elevated at times when checked at home. Will titrate Coreg to 6.25mg  BID as discussed above.   6. HLD -  Recent FLP during hospitalization showed total cholesterol 207, triglycerides 75, HDL 107 and LDL 85. She was switched to Crestor 40 mg daily and will need a repeat FLP and LFT's in 2 months. They report she is scheduled for follow-up labs with her PCP around that time. If not obtained in the interim, would check at follow-up.      Signed, Ellsworth Lennox, PA-C

## 2023-04-01 NOTE — Patient Instructions (Addendum)
Visit Information  Thank you for taking time to visit with me today. Please don't hesitate to contact me if I can be of assistance to you.   Following are the goals we discussed today:   Goals Addressed             This Visit's Progress    [facility] care coordinator services (Diabetes, Cardiac illnesses, medicines, nutrition))   On track    Interventions Today    Flowsheet Row Most Recent Value  Chronic Disease   Chronic disease during today's visit Diabetes, Hypertension (HTN), Chronic Kidney Disease/End Stage Renal Disease (ESRD)  General Interventions   General Interventions Discussed/Reviewed General Interventions Discussed, Labs, Doctor Visits, [facility] Kidney Function, Hgb A1c every 6 months  Doctor Visits Discussed/Reviewed Doctor Visits Discussed, PCP, Specialist  Durable Medical Equipment (DME) --  Robbie Louis CPAP]  PCP/Specialist Visits Compliance with follow-up visit  Applications --  [other]  Exercise Interventions   Exercise Discussed/Reviewed Exercise Discussed, Physical Activity  Physical Activity Discussed/Reviewed Physical Activity Discussed  Education Interventions   Education Provided Provided Web-based Education, Provided Education  Provided Verbal Education On Medication, Labs, Other  Labs Reviewed Kidney Function  Applications --  [other]  Mental Health Interventions   Mental Health Discussed/Reviewed Mental Health Discussed, Coping Strategies  Nutrition Interventions   Nutrition Discussed/Reviewed Nutrition Discussed  [decrease appetite, medicine, encourage providing preferred foods as she recovers]  Pharmacy Interventions   Pharmacy Dicussed/Reviewed Pharmacy Topics Discussed, Medications and their functions, Affording Medications  Advanced Directive Interventions   Advanced Directives Discussed/Reviewed Advanced Directives Discussed, Advanced Care Planning, Provided resource for acquiring and filling out documents              Our  next appointment is by telephone on 04/29/23 at 1200  Please call the care guide team at [phone] if you need to cancel or reschedule your appointment.   If you are experiencing a Mental Health or Behavioral Health Crisis or need someone to talk to, please call the Suicide and Crisis Lifeline: 988 call the [place] National Suicide Prevention Lifeline: 574-247-5738 or TTY: 331-194-7695 TTY 281-071-7149) to talk to a trained counselor call 1-800-273-TALK (toll free, 24 hour hotline) call the [facility]: [phone] call 911   Patient verbalizes understanding of instructions and care plan provided today and agrees to view in MyChart. Active MyChart status and patient understanding of how to access instructions and care plan via MyChart confirmed with patient.     The patient has been provided with contact information for the care management team and has been advised to call with any health related questions or concerns.   Zlata Alcaide L. Noelle Penner, RN, BSN, CCM [facility] Care Management Community Coordinator Office number [phone]

## 2023-04-01 NOTE — Patient Instructions (Signed)
Medication Instructions:  Your physician has recommended you make the following change in your medication:   Increase Coreg to 6.25 mg Two Times Daily   *If you need a refill on your cardiac medications before your next appointment, please call your pharmacy*   Lab Work: NONE   If you have labs (blood work) drawn today and your tests are completely normal, you will receive your results only by: MyChart Message (if you have MyChart) OR A paper copy in the mail If you have any lab test that is abnormal or we need to change your treatment, we will call you to review the results.   Testing/Procedures: Your physician has requested that you have an echocardiogram. Echocardiography is a painless test that uses sound waves to create images of your heart. It provides your doctor with information about the size and shape of your heart and how well your heart's chambers and valves are working. This procedure takes approximately one hour. There are no restrictions for this procedure. Please do NOT wear cologne, perfume, aftershave, or lotions (deodorant is allowed). Please arrive 15 minutes prior to your appointment time.    Follow-Up: At [facility], you and your health needs are our priority.  As part of our continuing mission to provide you with exceptional heart care, we have created designated Provider Care Teams.  These Care Teams include your primary Cardiologist (physician) and Advanced Practice Providers (APPs -  Physician Assistants and Nurse Practitioners) who all work together to provide you with the care you need, when you need it.  We recommend signing up for the patient portal called "MyChart".  Sign up information is provided on this After Visit Summary.  MyChart is used to connect with patients for Virtual Visits (Telemedicine).  Patients are able to view lab/test results, encounter notes, upcoming appointments, etc.  Non-urgent messages can be sent to your provider as well.    To learn more about what you can do with MyChart, go to [email or web address].    Your next appointment:   2 -3 month(s)  Provider:   Luane School, MD or Randall An, PA-C    Other Instructions Thank you for choosing [facility]!

## 2023-04-01 NOTE — Patient Outreach (Addendum)
  Care Coordination   Initial Visit Note   04/01/2023 Name: Mikayla Jenkins MRN: [id number] DOB: 1946/01/11  [name] is a 77 y.o. year old female who sees Kerri Perches, MD for primary care. I spoke with [name], daughter of REAH VARRONE by phone today. [name] confirms she has been designated as the primary contact for her parents. Designated Arboriculturist (DPR) on file   What matters to the patients health and wellness today?  Poor appetite, taking medication (adherence), memory changes, Kidney function changes   The family has put interventions in place to assist with medication adherence/preventing duplicate administration use of  pill box with an electric clock to remind her of what day and time- alarms to take medicines  Mrs Mcglown had been noted to question if she had taken her evening/night medicines and may have taken an extra dose if she was not able to recall  Poor appetite  No history of Thyroid concerns Various tests completed   Sleep Apnea Has a CPAP and wears it as ordered  [facility] has evaluated and visits have started (RN & PT)     Goals Addressed             This Visit's Progress    [facility] care coordinator services (Diabetes, Cardiac illnesses, medicines, nutrition))   On track    Interventions Today    Flowsheet Row Most Recent Value  Chronic Disease   Chronic disease during today's visit Diabetes, Hypertension (HTN), Chronic Kidney Disease/End Stage Renal Disease (ESRD)  General Interventions   General Interventions Discussed/Reviewed General Interventions Discussed, Labs, Doctor Visits, [facility] Kidney Function, Hgb A1c every 6 months  Doctor Visits Discussed/Reviewed Doctor Visits Discussed, PCP, Specialist  Durable Medical Equipment (DME) --  Robbie Louis CPAP]  PCP/Specialist Visits Compliance with follow-up visit  Applications --  [other]  Exercise Interventions   Exercise Discussed/Reviewed Exercise Discussed, Physical  Activity  Physical Activity Discussed/Reviewed Physical Activity Discussed  Education Interventions   Education Provided Provided Web-based Education, Provided Education  Provided Verbal Education On Medication, Labs, Other  Labs Reviewed Kidney Function  Applications --  [other]  Mental Health Interventions   Mental Health Discussed/Reviewed Mental Health Discussed, Coping Strategies  Nutrition Interventions   Nutrition Discussed/Reviewed Nutrition Discussed  [decrease appetite, medicine, encourage providing preferred foods as she recovers]  Pharmacy Interventions   Pharmacy Dicussed/Reviewed Pharmacy Topics Discussed, Medications and their functions, Affording Medications  Advanced Directive Interventions   Advanced Directives Discussed/Reviewed Advanced Directives Discussed, Advanced Care Planning, Provided resource for acquiring and filling out documents              SDOH assessments and interventions completed:  Yes  SDOH Interventions Today    Flowsheet Row Most Recent Value  SDOH Interventions   Food Insecurity Interventions Intervention Not Indicated  Transportation Interventions Intervention Not Indicated  Utilities Interventions Intervention Not Indicated  Financial Strain Interventions Intervention Not Indicated        Care Coordination Interventions:  Yes, provided   Follow up plan: Follow up call scheduled for 04/29/23    Encounter Outcome:  Pt. Visit Completed    Rolin Schult L. Noelle Penner, RN, BSN, CCM [facility] Care Management Community Coordinator Office number [phone]

## 2023-04-04 ENCOUNTER — Ambulatory Visit: Payer: Medicare HMO | Admitting: Nurse Practitioner

## 2023-04-06 ENCOUNTER — Ambulatory Visit (HOSPITAL_COMMUNITY)
Admit: 2023-04-06 | Discharge: 2023-04-06 | Disposition: A | Discharge status: Home / Self Care | Payer: Medicare HMO | Source: Ambulatory Visit | Attending: Physician Assistant | Admitting: Physician Assistant

## 2023-04-06 ENCOUNTER — Encounter (HOSPITAL_COMMUNITY): Payer: Self-pay

## 2023-04-06 VITALS — BP 140/82 | HR 69 | Ht 65.0 in | Wt 135.0 lb

## 2023-04-06 DIAGNOSIS — I513 Intracardiac thrombosis, not elsewhere classified: Secondary | ICD-10-CM

## 2023-04-06 DIAGNOSIS — I10 Essential (primary) hypertension: Secondary | ICD-10-CM | POA: Diagnosis not present

## 2023-04-06 DIAGNOSIS — Z79899 Other long term (current) drug therapy: Secondary | ICD-10-CM | POA: Diagnosis not present

## 2023-04-06 DIAGNOSIS — E785 Hyperlipidemia, unspecified: Secondary | ICD-10-CM | POA: Diagnosis not present

## 2023-04-06 DIAGNOSIS — I5181 Takotsubo syndrome: Secondary | ICD-10-CM | POA: Diagnosis not present

## 2023-04-06 DIAGNOSIS — I236 Thrombosis of atrium, auricular appendage, and ventricle as current complications following acute myocardial infarction: Secondary | ICD-10-CM | POA: Diagnosis not present

## 2023-04-06 DIAGNOSIS — I252 Old myocardial infarction: Secondary | ICD-10-CM | POA: Diagnosis not present

## 2023-04-06 DIAGNOSIS — E1122 Type 2 diabetes mellitus with diabetic chronic kidney disease: Secondary | ICD-10-CM | POA: Insufficient documentation

## 2023-04-06 DIAGNOSIS — Z7984 Long term (current) use of oral hypoglycemic drugs: Secondary | ICD-10-CM | POA: Insufficient documentation

## 2023-04-06 DIAGNOSIS — I214 Non-ST elevation (NSTEMI) myocardial infarction: Secondary | ICD-10-CM

## 2023-04-06 DIAGNOSIS — I130 Hypertensive heart and chronic kidney disease with heart failure and stage 1 through stage 4 chronic kidney disease, or unspecified chronic kidney disease: Secondary | ICD-10-CM | POA: Insufficient documentation

## 2023-04-06 DIAGNOSIS — N1832 Chronic kidney disease, stage 3b: Secondary | ICD-10-CM

## 2023-04-06 DIAGNOSIS — I509 Heart failure, unspecified: Secondary | ICD-10-CM | POA: Insufficient documentation

## 2023-04-06 DIAGNOSIS — Z7901 Long term (current) use of anticoagulants: Secondary | ICD-10-CM | POA: Diagnosis not present

## 2023-04-06 NOTE — Patient Instructions (Signed)
Medication Changes:  No Changes In Medications at this time.    Follow-Up: CONTINUE FOLLOWING WITH YOUR PRIMARY CARDIOLOGIST   At the [facility], you and your health needs are our priority. We have a designated team specialized in the treatment of Heart Failure. This Care Team includes your primary Heart Failure Specialized Cardiologist (physician), Advanced Practice Providers (APPs- Physician Assistants and Nurse Practitioners), and Pharmacist who all work together to provide you with the care you need, when you need it.   You may see any of the following providers on your designated Care Team at your next follow up:  Dr. Arvilla Meres Dr. Marca Ancona Dr. Marcos Eke, NP Robbie Lis, [place] [facility] [place], [place] Brynda Peon, NP Karle Plumber, PharmD   Please be sure to bring in all your medications bottles to every appointment.   Need to Contact us:  If you have any questions or concerns before your next appointment please send [place] a message through [place] or call our office at [phone].    TO LEAVE A MESSAGE FOR THE NURSE SELECT OPTION 2, PLEASE LEAVE A MESSAGE INCLUDING: YOUR NAME DATE OF BIRTH CALL BACK NUMBER REASON FOR CALL**this is important as we prioritize the call backs  YOU WILL RECEIVE A CALL BACK THE SAME DAY AS LONG AS YOU CALL BEFORE 4:00 PM

## 2023-04-06 NOTE — Progress Notes (Signed)
HEART & VASCULAR TRANSITION OF CARE CONSULT NOTE     Referring Physician: Dr. Tresa Endo Primary Care: Dr. Lodema Hong Primary Cardiologist: Establishing [place]  HPI: Referred to clinic by Dr. Tresa Endo with [facility] for heart failure consultation. 77 y.o. female with history of DM II, HTN, HLD, CKD.   Admitted 03/17/23 with NSTEMI. HS troponin peaked at 3099. No significant CAD on cardiac cath. Normal EDP. Echo with EF 25-30% and WMA suggestive of stress cardiomyopathy. cMRI: LVEF 41%, RVEF 46%, findings consistent with Takotsubo cardiomyopathy including akinesis of LV apical segments, no LGE, LV apical thrombus.  She was discharged on Coreg and Eliquis. GDMT limited by hypotension. Had worsening renal function with spiro in the past. Course c/b AKI, Scr peaked at 2.09 and improved to 1.27.  Saw Cardiology for f/u on 06/14. Coreg was increased  She is here today for hospital follow-up. The patient is accompanied by her daughter, who is a Insurance account manager. Daughter assists with providing history. Her energy level has been improving gradually since discharge. Doing some light activity including chores without difficulty. No dyspnea, orthopnea, PND or lower extremity edema. Denies CP. Compliant with medications. Her blood pressure is elevated at times before meds, better after meds (often 120s-130s systolic since coreg was increased).     Past Medical History:  Diagnosis Date   Diabetes mellitus type II    Heart murmur    Hyperlipidemia    Hypertension    Renal disorder    Sleep apnea     Current Outpatient Medications  Medication Sig Dispense Refill   acetaminophen (TYLENOL) 650 MG CR tablet Take 650 mg by mouth every 8 (eight) hours as needed for pain.     apixaban (ELIQUIS) 5 MG TABS tablet Take 1 tablet (5 mg total) by mouth 2 (two) times daily. 60 tablet 11   blood glucose meter kit and supplies Dispense based on patient and insurance preference. Use up to four times daily as directed.  (FOR ICD-10 E10.9, E11.9). 1 each 0   blood glucose meter kit and supplies Dispense based on patient and insurance preference  Accuchek guide meter and supplies once daily testing dx e11.9 1 each 0   Calcium Carbonate-Vitamin D 600-400 MG-UNIT tablet Take 1 tablet by mouth in the morning.     carvedilol (COREG) 6.25 MG tablet Take 1 tablet (6.25 mg total) by mouth 2 (two) times daily. 180 tablet 3   glipiZIDE (GLUCOTROL XL) 2.5 MG 24 hr tablet TAKE 1 TABLET WITH BREAKFAST EVERY TUESDAY, THURSDAY AND SATURDAY 36 tablet 3   omeprazole (PRILOSEC) 40 MG capsule Take 1 capsule (40 mg total) by mouth daily. (Patient taking differently: Take 40 mg by mouth in the morning.) 90 capsule 3   rosuvastatin (CRESTOR) 40 MG tablet Take 1 tablet (40 mg total) by mouth daily. 90 tablet 3   No current facility-administered medications for this encounter.    Allergies  Allergen Reactions   Metformin And Related Other (See Comments)    CKD stage 4      Social History   Socioeconomic History   Marital status: Married    Spouse name: Jake Shark   Number of children: 3   Years of education: Not on file   Highest education level: High school graduate  Occupational History   Occupation: Retired  Tobacco Use   Smoking status: Never    Passive exposure: Never   Smokeless tobacco: Never  Vaping Use   Vaping Use: Never used  Substance and Sexual Activity  Alcohol use: No   Drug use: No   Sexual activity: Yes    Birth control/protection: Surgical  Other Topics Concern   Not on file  Social History Narrative   Not on file   Social Determinants of Health   Financial Resource Strain: Low Risk  (04/01/2023)   Overall Financial Resource Strain (CARDIA)    Difficulty of Paying Living Expenses: Not very hard  Food Insecurity: No Food Insecurity (04/01/2023)   Hunger Vital Sign    Worried About Running Out of Food in the Last Year: Never true    Ran Out of Food in the Last Year: Never true  Transportation  Needs: No Transportation Needs (04/01/2023)   PRAPARE - Administrator, Civil Service (Medical): No    Lack of Transportation (Non-Medical): No  Physical Activity: Insufficiently Active (05/31/2022)   Exercise Vital Sign    Days of Exercise per Week: 3 days    Minutes of Exercise per Session: 30 min  Stress: No Stress Concern Present (05/31/2022)   [employer] of Occupational Health - Occupational Stress Questionnaire    Feeling of Stress : Not at all  Social Connections: Moderately Integrated (05/31/2022)   Social Connection and Isolation Panel [NHANES]    Frequency of Communication with Friends and Family: More than three times a week    Frequency of Social Gatherings with Friends and Family: Once a week    Attends Religious Services: More than 4 times per year    Active Member of [employer] or Organizations: No    Attends Banker Meetings: Never    Marital Status: Married  Catering manager Violence: Not At Risk (03/17/2023)   Humiliation, Afraid, Rape, and Kick questionnaire    Fear of Current or Ex-Partner: No    Emotionally Abused: No    Physically Abused: No    Sexually Abused: No      Family History  Problem Relation Age of Onset   Pneumonia Brother        on continuous O2   Stroke Brother    Stroke Sister    COPD Sister    Diabetes Sister    Stroke Sister    Hypertension Mother    Diabetes Mother    Fibroids Daughter        uterine    Vitals:   04/06/23 1401  BP: (!) 140/82  Pulse: 69  SpO2: 98%  Weight: 61.2 kg (135 lb)  Height: 5\' 5"  (1.651 m)    PHYSICAL EXAM: General:  Well appearing elderly female. No distress. HEENT: normal Neck: supple. no JVD. Carotids 2+ bilat; no bruits.  Cor: PMI nondisplaced. Regular rate & rhythm. No rubs, gallops or murmurs. Lungs: clear Abdomen: soft, nontender, nondistended.  Extremities: no cyanosis, clubbing, rash, edema Neuro: alert & oriented x 3. Affect pleasant.   ASSESSMENT &  PLAN: Takotsubo Cardiomyopathy Echo with EF 25-30% and WMA suggestive of stress cardiomyopathy.  No significant CAD on LHC cMRI: LVEF 41%, RVEF 46%, findings consistent with Takotsubo cardiomyopathy including akinesis of LV apical segments, no LGE, + LV apical thrombus NYHA II GDMT  Diuretic-N/A. Volume stable on exam Continue Coreg 6.25 mg BID -Had hypotension and AKI during recent admit with addition of spiro and jardiance. -Had worsening renal function with several trials on spiro in the past -Discussed retrial with SGLT2i or ARB with patient and her daughter. They are concerned about risk of recurrent AKIs, especially with an SGLT2i, as she does not always have great PO  intake.  -She has an upcoming echo in July. If EF not improving, they would be open to considering an ARB at that time.  Recent NSTEMI -HS troponin up to 3099 -No significant CAD on cath and cMRI not suggestive of myocarditis -Felt to be d/t Takotsubo Cardiomyopathy  HTN -BP elevated today but has been better at home.  -Continue to monitor -Consider ARB in future as above  HLD -Recently started rosuvastatin. -Management per PCP or Cardiology  AKI on CKD IIIb -Scr baseline seems to be 1.2-1.5, peaked at 2.09 during recent admit in setting of hypotension  -Now back to baseline, 1.38 on 06/11  DM II -A1c 7.2% -On glipizide -No SGLT2i as above  LV thrombus -Noted on cMRI  -Repeat echo scheduled in July. Will need contrast to evaluate for persistent LV thrombus. -Now on Eliquis 5 mg BID   Referred to HFSW (PCP, Medications, Transportation, ETOH Abuse, Drug Abuse, Insurance, Financial ): No Refer to Pharmacy: No Refer to Home Health: No Refer to [facility]: No Refer to General Cardiology: No  Follow up  PRN, Cardiology as scheduled in August 2024

## 2023-04-07 DIAGNOSIS — E785 Hyperlipidemia, unspecified: Secondary | ICD-10-CM | POA: Diagnosis not present

## 2023-04-07 DIAGNOSIS — I130 Hypertensive heart and chronic kidney disease with heart failure and stage 1 through stage 4 chronic kidney disease, or unspecified chronic kidney disease: Secondary | ICD-10-CM | POA: Diagnosis not present

## 2023-04-07 DIAGNOSIS — N183 Chronic kidney disease, stage 3 unspecified: Secondary | ICD-10-CM | POA: Diagnosis not present

## 2023-04-07 DIAGNOSIS — I5041 Acute combined systolic (congestive) and diastolic (congestive) heart failure: Secondary | ICD-10-CM | POA: Diagnosis not present

## 2023-04-07 DIAGNOSIS — E1169 Type 2 diabetes mellitus with other specified complication: Secondary | ICD-10-CM | POA: Diagnosis not present

## 2023-04-07 DIAGNOSIS — E1122 Type 2 diabetes mellitus with diabetic chronic kidney disease: Secondary | ICD-10-CM | POA: Diagnosis not present

## 2023-04-07 DIAGNOSIS — I5181 Takotsubo syndrome: Secondary | ICD-10-CM | POA: Diagnosis not present

## 2023-04-07 DIAGNOSIS — I214 Non-ST elevation (NSTEMI) myocardial infarction: Secondary | ICD-10-CM | POA: Diagnosis not present

## 2023-04-07 DIAGNOSIS — R413 Other amnesia: Secondary | ICD-10-CM | POA: Diagnosis not present

## 2023-04-12 ENCOUNTER — Ambulatory Visit (INDEPENDENT_AMBULATORY_CARE_PROVIDER_SITE_OTHER): Payer: Medicare HMO | Admitting: Gastroenterology

## 2023-04-12 ENCOUNTER — Encounter (INDEPENDENT_AMBULATORY_CARE_PROVIDER_SITE_OTHER): Payer: Self-pay | Admitting: Gastroenterology

## 2023-04-12 VITALS — BP 153/78 | HR 65 | Temp 98.1°F | Ht 65.0 in | Wt 138.5 lb

## 2023-04-12 DIAGNOSIS — R6881 Early satiety: Secondary | ICD-10-CM

## 2023-04-12 DIAGNOSIS — R634 Abnormal weight loss: Secondary | ICD-10-CM

## 2023-04-12 NOTE — Patient Instructions (Signed)
We will continue with omeprazole 40mg  daily for now, can consider decreasing down to 20mg  daily if doing well at follow up visit Continue with liberalizing diet and eating what you are able to, you can do protein shakes in between meals to get added nutrition If there is further weight loss, please discuss this with your PCP If feeling of getting full early with eating recurs, please let me know  Follow up 4 months

## 2023-04-12 NOTE — Progress Notes (Addendum)
Referring Provider: Kerri Perches, MD Primary Care Physician:  Kerri Perches, MD Primary GI Physician: Levon Hedger   Chief Complaint  Patient presents with   Weight Loss    Follow up on weight loss. Does a boost shake a few times per week.    Gastroesophageal Reflux    Follow up on GERD. Has improved some since starting omeprazole daily.    HPI:   Mikayla Jenkins is a 77 y.o. female with past medical history of DM II, HLD, HTN, renal disorder   Patient presenting today for follow up of weight loss and early satiety   Last seen February 2024, at that time she had lost approx 12 pounds over the past year or so. endorsed some early satiety. Patient overall feels good. Reported recent hip surgery, thinks some issues started thereafter.   Recommended scheduling EGD for further evaluation, CT C/A/P if EGD normal. CT of chest not approved, patient underwent CT A/P 3/18 which did not show any abnormalities, advised to follow up with PCP regarding ongoing weight loss.  Present:  Patient's daughter states that patient was doing better since starting omeprazole and eating better, had recent hospitalization due to cardiac issues, she lost a few pounds then but has gained some back. She feels appetite is improving again.  She is doing a few boost per week. She feels that early satiety has improved as well. No red flag symptoms. Patient denies melena, hematochezia, nausea, vomiting, diarrhea, constipation, dysphagia, odynophagia.     EGD: 12/2022 - 2 cm hiatal hernia. - Gastritis. Biopsied. - Normal examined duodenum. Cologuard: 10/2019 negative  Last Colonoscopy:2016 Examination performed to cecum. Redundant colon with no evidence of colonic polyps. Small internal and external hemorrhoids.   Past Medical History:  Diagnosis Date   Diabetes mellitus type II    Heart murmur    Hyperlipidemia    Hypertension    Renal disorder    Sleep apnea     Past Surgical History:  Procedure  Laterality Date   ABDOMINAL HYSTERECTOMY     BIOPSY  12/28/2022   Procedure: BIOPSY;  Surgeon: Dolores Frame, MD;  Location: [facility];  Service: Gastroenterology;;   COLONOSCOPY N/A 06/13/2015   Procedure: COLONOSCOPY;  Surgeon: Malissa Hippo, MD;  Location: [facility];  Service: Endoscopy;  Laterality: N/A;  930-rescheduled 8/26 @ 8:30am Ann to notify pt   CORONARY/GRAFT ACUTE MI REVASCULARIZATION N/A 03/16/2023   Procedure: Coronary/Graft Acute MI Revascularization;  Surgeon: Marykay Lex, MD;  Location: [facility] INVASIVE CV LAB;  Service: Cardiovascular;  Laterality: N/A;   ESOPHAGOGASTRODUODENOSCOPY (EGD) WITH PROPOFOL N/A 12/28/2022   Procedure: ESOPHAGOGASTRODUODENOSCOPY (EGD) WITH PROPOFOL;  Surgeon: Dolores Frame, MD;  Location: [facility];  Service: Gastroenterology;  Laterality: N/A;  115pm, asa 3   HERNIA REPAIR     inguinal herniorrhapy right     LEFT HEART CATH AND CORONARY ANGIOGRAPHY N/A 03/16/2023   Procedure: LEFT HEART CATH AND CORONARY ANGIOGRAPHY;  Surgeon: Marykay Lex, MD;  Location: [facility] INVASIVE CV LAB;  Service: Cardiovascular;  Laterality: N/A;   right eye surgery secondary to right eye weakness     SALPINGOOPHORECTOMY     TOTAL HIP ARTHROPLASTY Right 01/06/2021   TOTAL HIP ARTHROPLASTY Right 01/06/2021   Procedure: RIGHT TOTAL HIP ARTHROPLASTY ANTERIOR APPROACH;  Surgeon: Kathryne Hitch, MD;  Location: [facility] OR;  Service: Orthopedics;  Laterality: Right;   TUBAL LIGATION      Current Outpatient Medications  Medication Sig  Dispense Refill   acetaminophen (TYLENOL) 650 MG CR tablet Take 650 mg by mouth every 8 (eight) hours as needed for pain.     apixaban (ELIQUIS) 5 MG TABS tablet Take 1 tablet (5 mg total) by mouth 2 (two) times daily. 60 tablet 11   blood glucose meter kit and supplies Dispense based on patient and insurance preference. Use up to four times daily as directed. (FOR ICD-10 E10.9, E11.9). 1 each 0   blood  glucose meter kit and supplies Dispense based on patient and insurance preference  Accuchek guide meter and supplies once daily testing dx e11.9 1 each 0   Calcium Carbonate-Vitamin D 600-400 MG-UNIT tablet Take 1 tablet by mouth in the morning.     carvedilol (COREG) 6.25 MG tablet Take 1 tablet (6.25 mg total) by mouth 2 (two) times daily. 180 tablet 3   glipiZIDE (GLUCOTROL XL) 2.5 MG 24 hr tablet TAKE 1 TABLET WITH BREAKFAST EVERY TUESDAY, THURSDAY AND SATURDAY 36 tablet 3   omeprazole (PRILOSEC) 40 MG capsule Take 1 capsule (40 mg total) by mouth daily. (Patient taking differently: Take 40 mg by mouth in the morning.) 90 capsule 3   rosuvastatin (CRESTOR) 40 MG tablet Take 1 tablet (40 mg total) by mouth daily. 90 tablet 3   No current facility-administered medications for this visit.    Allergies as of 04/12/2023 - Review Complete 04/12/2023  Allergen Reaction Noted   Metformin and related Other (See Comments) 01/25/2019    Family History  Problem Relation Age of Onset   Pneumonia Brother        on continuous O2   Stroke Brother    Stroke Sister    COPD Sister    Diabetes Sister    Stroke Sister    Hypertension Mother    Diabetes Mother    Fibroids Daughter        uterine    Social History   Socioeconomic History   Marital status: Married    Spouse name: Jake Shark   Number of children: 3   Years of education: Not on file   Highest education level: High school graduate  Occupational History   Occupation: Retired  Tobacco Use   Smoking status: Never    Passive exposure: Never   Smokeless tobacco: Never  Vaping Use   Vaping Use: Never used  Substance and Sexual Activity   Alcohol use: No   Drug use: No   Sexual activity: Yes    Birth control/protection: Surgical  Other Topics Concern   Not on file  Social History Narrative   Not on file   Social Determinants of Health   Financial Resource Strain: Low Risk  (04/01/2023)   Overall Financial Resource Strain  (CARDIA)    Difficulty of Paying Living Expenses: Not very hard  Food Insecurity: No Food Insecurity (04/01/2023)   Hunger Vital Sign    Worried About Running Out of Food in the Last Year: Never true    Ran Out of Food in the Last Year: Never true  Transportation Needs: No Transportation Needs (04/01/2023)   PRAPARE - Administrator, Civil Service (Medical): No    Lack of Transportation (Non-Medical): No  Physical Activity: Insufficiently Active (05/31/2022)   Exercise Vital Sign    Days of Exercise per Week: 3 days    Minutes of Exercise per Session: 30 min  Stress: No Stress Concern Present (05/31/2022)   [employer] of Occupational Health - Occupational Stress Questionnaire  Feeling of Stress : Not at all  Social Connections: Moderately Integrated (05/31/2022)   Social Connection and Isolation Panel [NHANES]    Frequency of Communication with Friends and Family: More than three times a week    Frequency of Social Gatherings with Friends and Family: Once a week    Attends Religious Services: More than 4 times per year    Active Member of [employer] or Organizations: No    Attends Engineer, structural: Never    Marital Status: Married    Review of systems General: negative for malaise, night sweats, fever, chills, weight loss Neck: Negative for lumps, goiter, pain and significant neck swelling Resp: Negative for cough, wheezing, dyspnea at rest CV: Negative for chest pain, leg swelling, palpitations, orthopnea GI: denies melena, hematochezia, nausea, vomiting, diarrhea, constipation, dysphagia, odyonophagia, early satiety or unintentional weight loss.  MSK: Negative for joint pain or swelling, back pain, and muscle pain. Derm: Negative for itching or rash Psych: Denies depression, anxiety, memory loss, confusion. No homicidal or suicidal ideation.  Heme: Negative for prolonged bleeding, bruising easily, and swollen nodes. Endocrine: Negative for cold or heat  intolerance, polyuria, polydipsia and goiter. Neuro: negative for tremor, gait imbalance, syncope and seizures. The remainder of the review of systems is noncontributory.  Physical Exam: BP (!) 169/79   Pulse 69   Temp 98.1 F (36.7 C) (Oral)   Ht 5\' 5"  (1.651 m)   Wt 138 lb 8 oz (62.8 kg)   BMI 23.05 kg/m  General:   Alert and oriented. No distress noted. Pleasant and cooperative.  Head:  Normocephalic and atraumatic. Eyes:  Conjuctiva clear without scleral icterus. Mouth:  Oral mucosa pink and moist. Good dentition. No lesions. Heart: Normal rate and rhythm, s1 and s2 heart sounds present.  Lungs: Clear lung sounds in all lobes. Respirations equal and unlabored. Abdomen:  +BS, soft, non-tender and non-distended. No rebound or guarding. No HSM or masses noted. Derm: No palmar erythema or jaundice Msk:  Symmetrical without gross deformities. Normal posture. Extremities:  Without edema. Neurologic:  Alert and  oriented x4 Psych:  Alert and cooperative. Normal mood and affect.  Invalid input(s): "6 MONTHS"   ASSESSMENT: Mikayla Jenkins is a 77 y.o. female presenting today for follow up of weight loss and early satiety  Recent EGD with gastritis, started on omeprazole 40mg  daily with improvement in early satiety. Daughter inquires if she needs to remain on this dosage of PPI, will continue with 40mg  daily for now, can consider stepping down to 20mg  daily at follow up as long as she is doing well. If early satiety recurs, will consider GES for further evaluation.  Weight loss: recent EGD as above, CT A/P thereafter was normal. Suspect weight loss may have been secondary to decreased PO intake due to early satiety which seems to have improve, she lost a few pounds during recent hospitalization for cardiac issues though reassuringly has gained some back. She is doing protein shakes a few times per week. Encouraged her to liberalize her diet and eat what she is able to, can continue to  supplement with protein shakes for added nutrition. If weight declines again, will need to discuss with PCP for other evaluations of this such as chest xray as chest CT as not approved by patient's insurance previously.    PLAN:  Continue with omeprazole 40mg  daily  2.  Consider stepping down to 20mg  daily at next visit.  3.  Follow up with PCP/cards regarding elevated BP  4. Consider GES If early satiety recurs   All questions were answered, patient verbalized understanding and is in agreement with plan as outlined above.   Follow Up: 4 months   Samar Venneman L. Jeanmarie Hubert, MSN, APRN, AGNP-C Adult-Gerontology Nurse Practitioner [facility]  I have reviewed the note and agree with the APP's assessment as described in this progress note  Katrinka Blazing, MD Gastroenterology and Hepatology [facility]

## 2023-04-13 ENCOUNTER — Other Ambulatory Visit: Payer: Self-pay

## 2023-04-13 DIAGNOSIS — E785 Hyperlipidemia, unspecified: Secondary | ICD-10-CM | POA: Diagnosis not present

## 2023-04-13 DIAGNOSIS — E1169 Type 2 diabetes mellitus with other specified complication: Secondary | ICD-10-CM | POA: Diagnosis not present

## 2023-04-13 DIAGNOSIS — I130 Hypertensive heart and chronic kidney disease with heart failure and stage 1 through stage 4 chronic kidney disease, or unspecified chronic kidney disease: Secondary | ICD-10-CM | POA: Diagnosis not present

## 2023-04-13 DIAGNOSIS — I5181 Takotsubo syndrome: Secondary | ICD-10-CM | POA: Diagnosis not present

## 2023-04-13 DIAGNOSIS — I5041 Acute combined systolic (congestive) and diastolic (congestive) heart failure: Secondary | ICD-10-CM | POA: Diagnosis not present

## 2023-04-13 DIAGNOSIS — E1122 Type 2 diabetes mellitus with diabetic chronic kidney disease: Secondary | ICD-10-CM | POA: Diagnosis not present

## 2023-04-13 DIAGNOSIS — I214 Non-ST elevation (NSTEMI) myocardial infarction: Secondary | ICD-10-CM | POA: Diagnosis not present

## 2023-04-13 DIAGNOSIS — N183 Chronic kidney disease, stage 3 unspecified: Secondary | ICD-10-CM | POA: Diagnosis not present

## 2023-04-13 DIAGNOSIS — R413 Other amnesia: Secondary | ICD-10-CM | POA: Diagnosis not present

## 2023-04-13 MED ORDER — CARVEDILOL 6.25 MG PO TABS: 6.2500 mg | ORAL_TABLET | Freq: Two times a day (BID) | ORAL | 1 refills | Status: DC

## 2023-04-13 MED ORDER — ROSUVASTATIN CALCIUM 40 MG PO TABS: 40.0000 mg | ORAL_TABLET | Freq: Every day | ORAL | 1 refills | Status: DC

## 2023-04-13 NOTE — Telephone Encounter (Signed)
Refilled crestor 40 mg and coreg 6.25 mg bid to [facility]

## 2023-04-18 ENCOUNTER — Other Ambulatory Visit (HOSPITAL_COMMUNITY): Payer: Self-pay

## 2023-04-20 ENCOUNTER — Other Ambulatory Visit: Payer: Self-pay | Admitting: Student

## 2023-04-20 ENCOUNTER — Other Ambulatory Visit
Admission: RE | Admit: 2023-04-20 | Discharge: 2023-04-20 | Disposition: A | Discharge status: Home / Self Care | Payer: Medicare HMO | Source: Ambulatory Visit | Attending: Ophthalmology | Admitting: Ophthalmology

## 2023-04-20 DIAGNOSIS — Z79899 Other long term (current) drug therapy: Secondary | ICD-10-CM

## 2023-04-20 DIAGNOSIS — H16012 Central corneal ulcer, left eye: Secondary | ICD-10-CM | POA: Diagnosis not present

## 2023-04-20 DIAGNOSIS — N1831 Chronic kidney disease, stage 3a: Secondary | ICD-10-CM

## 2023-04-20 MED ORDER — FUROSEMIDE 20 MG PO TABS: 20.0000 mg | ORAL_TABLET | Freq: Every day | ORAL | 3 refills | Status: DC | PRN

## 2023-04-20 MED ORDER — LOSARTAN POTASSIUM 25 MG PO TABS: 12.5000 mg | ORAL_TABLET | Freq: Every day | ORAL | 2 refills | Status: DC

## 2023-04-22 DIAGNOSIS — H1820 Unspecified corneal edema: Secondary | ICD-10-CM | POA: Diagnosis not present

## 2023-04-22 DIAGNOSIS — H16143 Punctate keratitis, bilateral: Secondary | ICD-10-CM | POA: Diagnosis not present

## 2023-04-22 DIAGNOSIS — H179 Unspecified corneal scar and opacity: Secondary | ICD-10-CM | POA: Diagnosis not present

## 2023-04-22 DIAGNOSIS — H168 Other keratitis: Secondary | ICD-10-CM | POA: Diagnosis not present

## 2023-04-25 ENCOUNTER — Other Ambulatory Visit (HOSPITAL_COMMUNITY): Payer: Self-pay

## 2023-04-25 DIAGNOSIS — H168 Other keratitis: Secondary | ICD-10-CM | POA: Diagnosis not present

## 2023-04-27 DIAGNOSIS — H168 Other keratitis: Secondary | ICD-10-CM | POA: Diagnosis not present

## 2023-04-28 ENCOUNTER — Ambulatory Visit (HOSPITAL_COMMUNITY): Payer: Medicare HMO | Attending: Cardiology

## 2023-04-28 DIAGNOSIS — I513 Intracardiac thrombosis, not elsewhere classified: Secondary | ICD-10-CM | POA: Diagnosis not present

## 2023-04-28 LAB — ECHOCARDIOGRAM LIMITED
Area-P 1/2: 3.79 cm2
Est EF: 40
S' Lateral: 2.9 cm

## 2023-04-28 MED ORDER — PERFLUTREN LIPID MICROSPHERE
1.0000 mL | INTRAVENOUS | Status: AC | PRN
Start: 2023-04-28 — End: 2023-04-28
  Administered 2023-04-28: 1 mL via INTRAVENOUS

## 2023-04-29 ENCOUNTER — Ambulatory Visit: Payer: Self-pay | Admitting: *Deleted

## 2023-04-29 ENCOUNTER — Telehealth: Payer: Self-pay

## 2023-04-29 DIAGNOSIS — Z79899 Other long term (current) drug therapy: Secondary | ICD-10-CM

## 2023-04-29 MED ORDER — LOSARTAN POTASSIUM 25 MG PO TABS: 25.0000 mg | ORAL_TABLET | Freq: Every day | ORAL | 3 refills | Status: DC

## 2023-04-29 NOTE — Telephone Encounter (Signed)
I spoke with daughter and she said her mothers EF was 41 % by 03/20/23 MR cardiac morphology and she asks why EF is lower. She also asks if she can increase losartan from 12.5 mg to 25 mg before changing her medications. Her last 2 BP readings were 153/85, 156/85,HR 65  I told her the eliquis could be stopped on 06/20/23.

## 2023-04-29 NOTE — Telephone Encounter (Signed)
Daughter declines to start Entresto. She said she is too nervous to start another medication since her mother has lethargy and cognitive impairment. She said this does not mean that she may at some point have her mother start Entresto. Daughter wants to have losartan increased to 25 mg every day first.I placed another bmet order for 2 weeks after starting dose increase.

## 2023-04-29 NOTE — Patient Instructions (Addendum)
Visit Information  Thank you for taking time to visit with me today. Please don't hesitate to contact me if I can be of assistance to you.   Following are the goals we discussed today:   Goals Addressed             This Visit's Progress    [facility] care coordinator services (Diabetes, Cardiac illnesses, medicines, nutrition))   On track    Interventions Today    Flowsheet Row Most Recent Value  Chronic Disease   Chronic disease during today's visit Diabetes, Hypertension (HTN), Congestive Heart Failure (CHF), Chronic Kidney Disease/End Stage Renal Disease (ESRD), Other  [bilateral eye symptoms]  General Interventions   General Interventions Discussed/Reviewed General Interventions Reviewed, Annual Eye Exam, Labs, Durable Medical Equipment (DME), Doctor Visits  Labs Hgb A1c every 6 months, Kidney Function  Doctor Visits Discussed/Reviewed Doctor Visits Reviewed, PCP, Specialist, Annual Wellness Visits  Durable Medical Equipment (DME) BP Cuff, Glucomoter, Other  [medicine dispenser,]  PCP/Specialist Visits Compliance with follow-up visit  Applications Other  [confirmed HPOA forms received]  Exercise Interventions   Exercise Discussed/Reviewed Exercise Reviewed, Physical Activity, Weight Managment  [Assessed nutrition, weight changes, supplements]  Physical Activity Discussed/Reviewed Physical Activity Reviewed, Home Exercise Program (HEP)  Weight Management Weight maintenance  Education Interventions   Education Provided Provided Education  Provided Verbal Education On Nutrition, Eye Care, Labs, Mental Health/Coping with Illness  Applications Other  [confirmed HPOA forms received]  Mental Health Interventions   Mental Health Discussed/Reviewed Mental Health Reviewed, Coping Strategies  [encouraged]  Nutrition Interventions   Nutrition Discussed/Reviewed Nutrition Reviewed, Supplemental nutrition, Decreasing salt, Fluid intake  Pharmacy Interventions   Pharmacy Dicussed/Reviewed  Pharmacy Topics Reviewed, Medication Adherence  Medication Adherence Not taking medication  [interventions in place to remind patient to take medicine to also prevent overdosing]  Safety Interventions   Safety Discussed/Reviewed Safety Discussed, Fall Risk  Advanced Directive Interventions   Advanced Directives Discussed/Reviewed Advanced Directives Reviewed, Provided resource for acquiring and filling out documents              Our next appointment is by telephone on 06/24/23 at 1200  Please call the care guide team at [phone] if you need to cancel or reschedule your appointment.   If you are experiencing a Mental Health or Behavioral Health Crisis or need someone to talk to, please call the Suicide and Crisis Lifeline: 988 call the [place] National Suicide Prevention Lifeline: 905-705-6926 or TTY: (726)749-9656 TTY 715-456-0138) to talk to a trained counselor call 1-800-273-TALK (toll free, 24 hour hotline) call the [facility]: [phone] call 911   Patient verbalizes understanding of instructions and care plan provided today and agrees to view in MyChart. Active MyChart status and patient understanding of how to access instructions and care plan via MyChart confirmed with patient.     The patient has been provided with contact information for the care management team and has been advised to call with any health related questions or concerns.   Avo Schlachter L. Noelle Penner, RN, BSN, CCM [facility] Care Management Community Coordinator Office number [phone]

## 2023-04-29 NOTE — Telephone Encounter (Signed)
   Cardiac MRI's and echocardiograms are different ways of viewing the heart muscle so there can be some discrepancy so 40% by echo vs. 41% by cMRI is essentially unchanged. I suspect this has not improved given that we have been limited on medical therapy given her intolerances during admission. I would strongly recommend they consider Entresto as this can help strengthen her heart muscle more than Losartan. Would start at 24-26mg  BID and recheck a BMET in 2 weeks. If they refuse, can titrate Losartan to 25mg  daily with BMET in 2 weeks.   Signed, Ellsworth Lennox, PA-C 04/29/2023, 9:13 AM

## 2023-04-29 NOTE — Patient Outreach (Signed)
Care Coordination   Follow Up Visit Note   04/29/2023 Name: Mikayla Jenkins MRN: [id number] DOB: 09-20-1946  [name] is a 77 y.o. year old female who sees Kerri Perches, MD for primary care. I spoke with daughter Syrena, [place] by phone today. Designated Arboriculturist (DPR) completed by angela  What matters to the patients health and wellness today?  Appetite -eating more  GI - seen by GI Taking in more Pedialyte Since April 18 2023 eye symptoms Right eye blood shot red, saw eye MD  Broken blood vessels, warm compresses Hx of Eliquis A few days later left eye rd & drainage- Had contacts in eyes for extended period of time " months follow up every 2-3 days pending culture of left eye Mis handled culture  Inquired about complaint department and wrote down [phone]  Diabetes Cbg 113 maintaining well Hypertension Elevated BPs, left ankle swelling - outreached to cardiology & Losartan 12.5 mg, prn lasix were added, Not preferring to start Entresto at this time.  Losartan has help to maintain BP better,134/78  BP up when more agitated  congestive Heart Failure (CHF) Weight now 131.8 lbs 04/28/23  04/24/23 132.8  no sob EF 40%  Home health therapy not needed after assessed  Dementia/Memory- more "easy to argue" & rude at time frequently when she has lost something and thinks someone has taking them Walking slower- lost left shoe insert She is cooking She can balance on 1 foot    Received HPOA forms - pending completion  Medication adherence -uses a bright digital clock showing day & time. Started not paying attention to her clock recently so a Med ready dispenser with alarm was ordered by her son  Coping -only speaks more with friends, generally she states everything is fine. No counselor    Goals Addressed             This Visit's Progress    [facility] care coordinator services (Diabetes, Cardiac illnesses, medicines, nutrition))   On track    Interventions Today     Flowsheet Row Most Recent Value  Chronic Disease   Chronic disease during today's visit Diabetes, Hypertension (HTN), Congestive Heart Failure (CHF), Chronic Kidney Disease/End Stage Renal Disease (ESRD), Other  [bilateral eye symptoms]  General Interventions   General Interventions Discussed/Reviewed General Interventions Reviewed, Annual Eye Exam, Labs, Durable Medical Equipment (DME), Doctor Visits  Labs Hgb A1c every 6 months, Kidney Function  Doctor Visits Discussed/Reviewed Doctor Visits Reviewed, PCP, Specialist, Annual Wellness Visits  Durable Medical Equipment (DME) BP Cuff, Glucomoter, Other  [medicine dispenser,]  PCP/Specialist Visits Compliance with follow-up visit  Applications Other  [confirmed HPOA forms received]  Exercise Interventions   Exercise Discussed/Reviewed Exercise Reviewed, Physical Activity, Weight Managment  [Assessed nutrition, weight changes, supplements]  Physical Activity Discussed/Reviewed Physical Activity Reviewed, Home Exercise Program (HEP)  Weight Management Weight maintenance  Education Interventions   Education Provided Provided Education  Provided Verbal Education On Nutrition, Eye Care, Labs, Mental Health/Coping with Illness  Applications Other  [confirmed HPOA forms received]  Mental Health Interventions   Mental Health Discussed/Reviewed Mental Health Reviewed, Coping Strategies  [encouraged]  Nutrition Interventions   Nutrition Discussed/Reviewed Nutrition Reviewed, Supplemental nutrition, Decreasing salt, Fluid intake  Pharmacy Interventions   Pharmacy Dicussed/Reviewed Pharmacy Topics Reviewed, Medication Adherence  Medication Adherence Not taking medication  [interventions in place to remind patient to take medicine to also prevent overdosing]  Safety Interventions   Safety Discussed/Reviewed Safety Discussed, Fall Risk  Advanced Directive Interventions   Advanced Directives Discussed/Reviewed Advanced Directives Reviewed, Provided  resource for acquiring and filling out documents              SDOH assessments and interventions completed:  No     Care Coordination Interventions:  Yes, provided   Follow up plan: Follow up call scheduled for 06/24/23    Encounter Outcome:  Pt. Visit Completed   Debie Ashline L. Noelle Penner, RN, BSN, CCM [facility] Care Management Community Coordinator Office number [phone]

## 2023-04-29 NOTE — Telephone Encounter (Signed)
-----   Message from [place] sent at 04/28/2023  5:09 PM EDT ----- Please let the patient and her daughter know that the pumping function of her heart has improved as her ejection fraction was previously at 25 to 30% and is now at 40% (normal is 55 to 65%). She was recently started on Losartan and would see how her BP has been trending with this? If BP allows, would anticipate switching Losartan to Entresto to continue to help with the pumping function of her heart. She did not have any evidence of a thrombus. Would complete a 29-month course of Eliquis and then can stop (approximately 06/20/2023).

## 2023-05-03 LAB — FUNGUS CULTURE WITH STAIN

## 2023-05-04 ENCOUNTER — Other Ambulatory Visit (HOSPITAL_COMMUNITY)
Admission: RE | Admit: 2023-05-04 | Discharge: 2023-05-04 | Disposition: A | Discharge status: Home / Self Care | Payer: Medicare HMO | Source: Ambulatory Visit | Attending: Student | Admitting: Student

## 2023-05-04 DIAGNOSIS — Z79899 Other long term (current) drug therapy: Secondary | ICD-10-CM | POA: Insufficient documentation

## 2023-05-04 DIAGNOSIS — H168 Other keratitis: Secondary | ICD-10-CM | POA: Diagnosis not present

## 2023-05-04 LAB — BASIC METABOLIC PANEL
Anion gap: 8 (ref 5–15)
BUN: 10 mg/dL (ref 8–23)
CO2: 23 mmol/L (ref 22–32)
Calcium: 9.2 mg/dL (ref 8.9–10.3)
Chloride: 106 mmol/L (ref 98–111)
Creatinine, Ser: 1.38 mg/dL — ABNORMAL HIGH (ref 0.44–1.00)
GFR, Estimated: 39 mL/min — ABNORMAL LOW (ref >60)
Glucose, Bld: 182 mg/dL — ABNORMAL HIGH (ref 70–99)
Potassium: 3.6 mmol/L (ref 3.5–5.1)
Sodium: 137 mmol/L (ref 135–145)

## 2023-05-12 ENCOUNTER — Telehealth: Payer: Self-pay

## 2023-05-12 NOTE — Telephone Encounter (Signed)
Patient daughter called wanting another prescription for a shoe wedged. CB#[phone]

## 2023-05-17 ENCOUNTER — Telehealth: Payer: Self-pay | Admitting: Orthopaedic Surgery

## 2023-05-17 NOTE — Telephone Encounter (Signed)
[facility] stating they cannot read Rx for shoe wedges, please refax over please advise, also can we make a copy to keep up front just in case [facility] has another Complaint

## 2023-05-18 NOTE — Telephone Encounter (Signed)
Re-faxed.

## 2023-05-30 ENCOUNTER — Ambulatory Visit: Payer: Medicare HMO | Admitting: Physician Assistant

## 2023-06-02 DIAGNOSIS — N189 Chronic kidney disease, unspecified: Secondary | ICD-10-CM | POA: Diagnosis not present

## 2023-06-02 DIAGNOSIS — Z79899 Other long term (current) drug therapy: Secondary | ICD-10-CM | POA: Diagnosis not present

## 2023-06-02 DIAGNOSIS — N1832 Chronic kidney disease, stage 3b: Secondary | ICD-10-CM | POA: Diagnosis not present

## 2023-06-02 DIAGNOSIS — R809 Proteinuria, unspecified: Secondary | ICD-10-CM | POA: Diagnosis not present

## 2023-06-02 DIAGNOSIS — D631 Anemia in chronic kidney disease: Secondary | ICD-10-CM | POA: Diagnosis not present

## 2023-06-02 DIAGNOSIS — E211 Secondary hyperparathyroidism, not elsewhere classified: Secondary | ICD-10-CM | POA: Diagnosis not present

## 2023-06-03 ENCOUNTER — Telehealth: Payer: Self-pay

## 2023-06-03 ENCOUNTER — Ambulatory Visit: Payer: Medicare HMO | Admitting: Family Medicine

## 2023-06-03 ENCOUNTER — Ambulatory Visit: Payer: Medicare HMO | Admitting: Internal Medicine

## 2023-06-03 DIAGNOSIS — Z79899 Other long term (current) drug therapy: Secondary | ICD-10-CM

## 2023-06-03 MED ORDER — LOSARTAN POTASSIUM 50 MG PO TABS: 50.0000 mg | ORAL_TABLET | Freq: Every day | ORAL | 3 refills | Status: DC

## 2023-06-03 NOTE — Telephone Encounter (Signed)
Good afternoon,   Thank you for the update! I agree that her blood pressure is significantly above goal. If her weight has been stable and she has not experienced any respiratory issues, I think titrating the Losartan would be reasonable. I would initially go to 50 mg daily and she can take two 25 mg tablets to equal this dose. We will make her pharmacy aware as she will be going through the medication at a faster rate. Would recommend a repeat basic metabolic panel in 2 weeks for reassessment of her electrolytes and kidney function.   Best,  [place]     E-scribed losartan 50 mg every day to [facility] entered for bmet

## 2023-06-06 DIAGNOSIS — Z0100 Encounter for examination of eyes and vision without abnormal findings: Secondary | ICD-10-CM | POA: Diagnosis not present

## 2023-06-06 DIAGNOSIS — H168 Other keratitis: Secondary | ICD-10-CM | POA: Diagnosis not present

## 2023-06-10 DIAGNOSIS — D638 Anemia in other chronic diseases classified elsewhere: Secondary | ICD-10-CM | POA: Diagnosis not present

## 2023-06-10 DIAGNOSIS — G4733 Obstructive sleep apnea (adult) (pediatric): Secondary | ICD-10-CM | POA: Diagnosis not present

## 2023-06-10 DIAGNOSIS — E1122 Type 2 diabetes mellitus with diabetic chronic kidney disease: Secondary | ICD-10-CM | POA: Diagnosis not present

## 2023-06-10 DIAGNOSIS — I129 Hypertensive chronic kidney disease with stage 1 through stage 4 chronic kidney disease, or unspecified chronic kidney disease: Secondary | ICD-10-CM | POA: Diagnosis not present

## 2023-06-14 ENCOUNTER — Ambulatory Visit: Payer: Medicare HMO | Admitting: Family Medicine

## 2023-06-17 ENCOUNTER — Encounter: Payer: Self-pay | Admitting: Student

## 2023-06-17 ENCOUNTER — Other Ambulatory Visit (HOSPITAL_COMMUNITY)
Admission: RE | Admit: 2023-06-17 | Discharge: 2023-06-17 | Disposition: A | Discharge status: Home / Self Care | Payer: Medicare HMO | Source: Other Acute Inpatient Hospital | Attending: Otolaryngology | Admitting: Otolaryngology

## 2023-06-17 ENCOUNTER — Ambulatory Visit: Payer: Medicare HMO | Attending: Internal Medicine | Admitting: Student

## 2023-06-17 VITALS — BP 162/84 | HR 82 | Ht 65.0 in | Wt 132.0 lb

## 2023-06-17 DIAGNOSIS — I513 Intracardiac thrombosis, not elsewhere classified: Secondary | ICD-10-CM

## 2023-06-17 DIAGNOSIS — D649 Anemia, unspecified: Secondary | ICD-10-CM | POA: Diagnosis not present

## 2023-06-17 DIAGNOSIS — Z79899 Other long term (current) drug therapy: Secondary | ICD-10-CM

## 2023-06-17 DIAGNOSIS — N1832 Chronic kidney disease, stage 3b: Secondary | ICD-10-CM

## 2023-06-17 DIAGNOSIS — I5181 Takotsubo syndrome: Secondary | ICD-10-CM

## 2023-06-17 DIAGNOSIS — I5021 Acute systolic (congestive) heart failure: Secondary | ICD-10-CM | POA: Diagnosis not present

## 2023-06-17 DIAGNOSIS — E785 Hyperlipidemia, unspecified: Secondary | ICD-10-CM

## 2023-06-17 DIAGNOSIS — I214 Non-ST elevation (NSTEMI) myocardial infarction: Secondary | ICD-10-CM | POA: Diagnosis not present

## 2023-06-17 LAB — BASIC METABOLIC PANEL
Anion gap: 9 (ref 5–15)
BUN: 10 mg/dL (ref 8–23)
CO2: 28 mmol/L (ref 22–32)
Calcium: 9.5 mg/dL (ref 8.9–10.3)
Chloride: 101 mmol/L (ref 98–111)
Creatinine, Ser: 1.33 mg/dL — ABNORMAL HIGH (ref 0.44–1.00)
GFR, Estimated: 41 mL/min — ABNORMAL LOW (ref >60)
Glucose, Bld: 165 mg/dL — ABNORMAL HIGH (ref 70–99)
Potassium: 3.2 mmol/L — ABNORMAL LOW (ref 3.5–5.1)
Sodium: 138 mmol/L (ref 135–145)

## 2023-06-17 LAB — FERRITIN: Ferritin: 13 ng/mL (ref 11–307)

## 2023-06-17 LAB — VITAMIN B12: Vitamin B-12: 587 pg/mL (ref 180–914)

## 2023-06-17 LAB — IRON: Iron: 83 ug/dL (ref 28–170)

## 2023-06-17 LAB — FOLATE: Folate: 15.6 ng/mL (ref >5.9)

## 2023-06-17 MED ORDER — LOSARTAN POTASSIUM 50 MG PO TABS: 75.0000 mg | ORAL_TABLET | Freq: Every day | ORAL | 3 refills | Status: DC

## 2023-06-17 MED ORDER — POTASSIUM CHLORIDE CRYS ER 20 MEQ PO TBCR: 20.0000 meq | EXTENDED_RELEASE_TABLET | ORAL | 5 refills | Status: DC

## 2023-06-17 MED ORDER — FUROSEMIDE 20 MG PO TABS: ORAL_TABLET | ORAL | 11 refills | Status: DC

## 2023-06-17 NOTE — Patient Instructions (Signed)
Medication Instructions:   Increase Lasix 20 mg to Two Times Weekly on Tuesday and Thursday   *If you need a refill on your cardiac medications before your next appointment, please call your pharmacy*   Lab Work: Your physician recommends that you return for lab work today.   If you have labs (blood work) drawn today and your tests are completely normal, you will receive your results only by: MyChart Message (if you have MyChart) OR A paper copy in the mail If you have any lab test that is abnormal or we need to change your treatment, we will call you to review the results.   Testing/Procedures: NONE    Follow-Up: At [facility], you and your health needs are our priority.  As part of our continuing mission to provide you with exceptional heart care, we have created designated Provider Care Teams.  These Care Teams include your primary Cardiologist (physician) and Advanced Practice Providers (APPs -  Physician Assistants and Nurse Practitioners) who all work together to provide you with the care you need, when you need it.  We recommend signing up for the patient portal called "MyChart".  Sign up information is provided on this After Visit Summary.  MyChart is used to connect with patients for Virtual Visits (Telemedicine).  Patients are able to view lab/test results, encounter notes, upcoming appointments, etc.  Non-urgent messages can be sent to your provider as well.   To learn more about what you can do with MyChart, go to [email or web address].    Your next appointment:   6 -8 week(s)  Provider:   You may see Vishnu P Mallipeddi, MD or one of the following Advanced Practice Providers on your designated Care Team:   Randall An, PA-C  Jacolyn Reedy, PA-C     Other Instructions Thank you for choosing [facility]!

## 2023-06-17 NOTE — Progress Notes (Signed)
Cardiology Office Note    Date:  06/17/2023  ID:  Mikayla Jenkins, Mikayla Jenkins April 19, 1946, MRN [id number] Cardiologist: Marjo Bicker, MD    History of Present Illness:    Mikayla Jenkins is a 77 y.o. female with past medical history of  HTN, HLD, Type 2 DM, Stage 3 CKD, OSA (on CPAP) and newly diagnosed HFrEF/Takotsubo Cardiomyopathy with LV thrombus (diagnosed in 03/2023 with EF 25-30% by echo and 41% by cMRI) who presents to the office today for 15-month follow-up.  She was last examined by myself in 03/2023 following her recent hospitalization and reported that her breathing had improved. She was using her CPAP on a nightly basis as well. Her family members were very concerned about medication changes but they were in agreement with titrating Coreg to 6.25 mg twice daily. She had previously been intolerant to Spironolactone despite multiple attempts due to her worsening renal function and did have an AKI with an SGLT2 inhibitor during her recent admission. It was recommended to obtain a follow-up limited echocardiogram for reassessment. She did follow-up with Advanced Heart Failure on 04/06/2023 and the discussion of restarting an ARB or SGLT2 inhibitor was reviewed with the patient's daughter who was a Teacher, early years/pre but she declined. Follow-up limited echocardiogram showed her EF was at 40% and Losartan was initiated. Given no evidence of recurrent LV thrombus, it was recommended she could stop Eliquis on 06/20/2023 They reported back in MyChart messages that her blood pressure had been elevated, therefore Losartan was titrated to 50 mg twice daily on 06/03/2023.  In talking with the patient and her daughter today, she reports overall doing well since her last visit. She denies any dyspnea on exertion, orthopnea or PND. Has not used her CPAP consistently and we reviewed the importance of consistent use given her CHF and HTN. She has experienced isolated edema along her left ankle and says this has improved with  taking Lasix over the past few days.  She denies any recent chest pain or palpitations.  BP has been elevated when checked at home with SBP averaging in the 140's to 150's.  Studies Reviewed:   EKG: EKG is not ordered today.    Cardiac Catheterization: 02/2023 Angiographically normal coronary artery disease with no obvious culprit lesion. Codominant system. Difficult to image left ventriculography, would recommend 2D echo but appears to be normal EF. Normal EDP.   Plan: Will need to consider different etiology for troponin elevation however for now we will go ahead and treat as though this could have been ACS with a lesion that cleared with nitroglycerin, aspirin and heparin.. For now we will treat with DAPT and 40 hours of IV heparin until other etiology is determined. Check 2D echo to better assess EF and consider cardiac MRI as there is concern for troponin elevation and her discomfort being related to myocarditis. Will continue most home medications but hold the thiazide diuretic and clonidine nightly. Sliding-scale insulin. Standard labs checked with A1c, lipid panel etc.     Anticipate relatively short stay depending additional evaluation.  cMRI: 03/2023 IMPRESSION: 1. Findings consistent with Takotsubo cardiomyopathy, including akinesis of LV apical segments, no late gadolinium enhancement, and elevated T2 values suggesting myocardial edema in apical segments.   2.  LV apical thrombus measures 12mm x 10mm x 6mm   3. Normal LV size, mild hypertrophy, and mild systolic dysfunction (EF 41%)   4.  Normal RV size with mild systolic dysfunction (EF 46%)  Limited Echo: 04/2023 IMPRESSIONS  1. Left ventricular ejection fraction, by estimation, is 40%. The left  ventricle has mildly decreased function. The left ventricle demonstrates  global hypokinesis. Left ventricular diastolic parameters are consistent  with Grade I diastolic dysfunction  (impaired relaxation). No LV  thrombus was visualized.   2. Right ventricular systolic function is mildly reduced. The right  ventricular size is normal. Tricuspid regurgitation signal is inadequate  for assessing PA pressure.   3. Left atrial size was moderately dilated.   4. The mitral valve is normal in structure. Trivial mitral valve  regurgitation. No evidence of mitral stenosis.   5. The aortic valve is tricuspid. Aortic valve regurgitation is not  visualized. No aortic stenosis is present.   6. Pulmonic valve regurgitation is moderate.   7. The inferior vena cava is normal in size with greater than 50%  respiratory variability, suggesting right atrial pressure of 3 mmHg.   Risk Assessment/Calculations:   HYPERTENSION CONTROL Vitals:   06/17/23 1458 06/17/23 1529  BP: (!) 182/94 (!) 162/84    The patient's blood pressure is elevated above target today.  In order to address the patient's elevated BP: A current anti-hypertensive medication was adjusted today.      STOP-Bang Score:  3        Physical Exam:   VS:  BP (!) 162/84   Pulse 82   Ht 5\' 5"  (1.651 m)   Wt 132 lb (59.9 kg)   SpO2 97%   BMI 21.97 kg/m    Wt Readings from Last 3 Encounters:  06/17/23 132 lb (59.9 kg)  04/12/23 138 lb 8 oz (62.8 kg)  04/06/23 135 lb (61.2 kg)     GEN: Pleasant, elderly female appearing in no acute distress NECK: No JVD; No carotid bruits CARDIAC: RRR, no murmurs, rubs, gallops RESPIRATORY:  Clear to auscultation without rales, wheezing or rhonchi  ABDOMEN: Appears non-distended. No obvious abdominal masses. EXTREMITIES: No clubbing or cyanosis. Trace left ankle edema.  Distal pedal pulses are 2+ bilaterally.   Assessment and Plan:   1. Chronic HFrEF/Takotsubo Cardiomyopathy - Her EF was at 25 to 30% by echocardiogram in 02/2023 and had improved to 40% by repeat echocardiogram last month. She has been experiencing issues with lower extremity edema but no respiratory issues. Her daughter is concerned  about dehydration given her variable PO intake. We reviewed options and she will try taking Lasix 20mg  twice weekly. Continue Losartan but plan to titrate pending repeat labs today. Her daughter does not wish for her to be on Entresto given concerns for dehydration and worsening renal function. Continue Coreg 6.25mg  BID. Previously intolerant to Spironolactone and SGLT2 inhibitors as she developed AKI's with both in the past.   2. History of NSTEMI - Hs Troponin values peaked at 3099 during her prior admission and cardiac catheterization at that time showed angiographically normal coronary arteries. No indication for further ischemic testing at this time. Continue with risk factor modification.  3. HTN - BP was initially elevated at 182/94, rechecked and at 162/84. Currently taking Coreg 6.25mg  BID and Losartan 50mg  daily. Will recheck a BMET today. If renal function and electrolytes are stable, will titrate Losartan to 75mg  daily.   4. HLD - LDL was 85 in 03/2023 and she was switched to Crestor 40 mg daily. She is due for repeat FLP and LFT's and will have this with her PCP within the next few weeks.   5. History of LV Thrombus - This had resolved by repeat imaging on 04/28/2023.  Was recommend to complete a 17-month course of Eliquis and then stop so she can discontinue it on 06/20/2023.  6. Stage 3 CKD - Baseline creatinine 1.3 - 1.4.  Peaked at 2.09 during her admission earlier this year and was stable at 1.38 when checked on 05/04/2023. Will recheck a BMET today.   7. Anemia - Hgb was at 12.6 when checked in 03/2023 but declined to 10.8 when checked earlier this month by Nephrology.  Will check Folate, B12, Iron and Ferritin. Likely anemia of chronic disease.   Signed, Ellsworth Lennox, PA-C

## 2023-06-21 ENCOUNTER — Telehealth: Payer: Self-pay | Admitting: *Deleted

## 2023-06-21 ENCOUNTER — Encounter: Payer: Self-pay | Admitting: Family Medicine

## 2023-06-21 DIAGNOSIS — E1121 Type 2 diabetes mellitus with diabetic nephropathy: Secondary | ICD-10-CM | POA: Diagnosis not present

## 2023-06-21 DIAGNOSIS — E785 Hyperlipidemia, unspecified: Secondary | ICD-10-CM | POA: Diagnosis not present

## 2023-06-21 DIAGNOSIS — I10 Essential (primary) hypertension: Secondary | ICD-10-CM | POA: Diagnosis not present

## 2023-06-21 MED ORDER — FERROUS SULFATE 325 (65 FE) MG PO TBEC: 325.0000 mg | DELAYED_RELEASE_TABLET | Freq: Every day | ORAL | Status: DC

## 2023-06-21 NOTE — Telephone Encounter (Signed)
-----   Message from Ellsworth Lennox sent at 06/17/2023  8:23 PM EDT ----- Please let the patient know her additional labs show her B12 and Folate are within a normal range. Her ferritin (iron stores) is at the lower end of normal as this is at 13 and was previously in the 40's. Normal is 11-300. Would recommend starting OTC Ferrous Sulfate 325mg  daily. If she develops constipation with this, can take every other day.

## 2023-06-21 NOTE — Telephone Encounter (Signed)
Daughter notified 

## 2023-06-23 ENCOUNTER — Ambulatory Visit (INDEPENDENT_AMBULATORY_CARE_PROVIDER_SITE_OTHER): Payer: Medicare HMO | Admitting: Family Medicine

## 2023-06-23 ENCOUNTER — Encounter: Payer: Medicare HMO | Admitting: *Deleted

## 2023-06-23 ENCOUNTER — Encounter: Payer: Self-pay | Admitting: Family Medicine

## 2023-06-23 VITALS — BP 190/104 | HR 68 | Ht 65.0 in | Wt 129.0 lb

## 2023-06-23 DIAGNOSIS — I10 Essential (primary) hypertension: Secondary | ICD-10-CM | POA: Diagnosis not present

## 2023-06-23 DIAGNOSIS — G4733 Obstructive sleep apnea (adult) (pediatric): Secondary | ICD-10-CM

## 2023-06-23 DIAGNOSIS — E785 Hyperlipidemia, unspecified: Secondary | ICD-10-CM | POA: Diagnosis not present

## 2023-06-23 DIAGNOSIS — E1121 Type 2 diabetes mellitus with diabetic nephropathy: Secondary | ICD-10-CM | POA: Diagnosis not present

## 2023-06-23 DIAGNOSIS — Z23 Encounter for immunization: Secondary | ICD-10-CM | POA: Diagnosis not present

## 2023-06-23 DIAGNOSIS — E1169 Type 2 diabetes mellitus with other specified complication: Secondary | ICD-10-CM

## 2023-06-23 DIAGNOSIS — N1831 Chronic kidney disease, stage 3a: Secondary | ICD-10-CM | POA: Diagnosis not present

## 2023-06-23 NOTE — Patient Instructions (Addendum)
F/u in 3.5 months, call if you need me before  Labs are excellent  Continue to work with Cardiology on bp  CBC today  Please follow through with completing hCPOA form , very important and encouraged for best outcomes  Thanks for choosing [facility], we consider it a privelige to serve you.

## 2023-06-24 ENCOUNTER — Ambulatory Visit: Payer: Self-pay | Admitting: *Deleted

## 2023-06-24 LAB — CBC
Hematocrit: 36.7 % (ref 34.0–46.6)
Hemoglobin: 12.1 g/dL (ref 11.1–15.9)
MCH: 31.4 pg (ref 26.6–33.0)
MCHC: 33 g/dL (ref 31.5–35.7)
MCV: 95 fL (ref 79–97)
Platelets: 160 10*3/uL (ref 150–450)
RBC: 3.85 x10E6/uL (ref 3.77–5.28)
RDW: 11.8 % (ref 11.7–15.4)
WBC: 4.3 10*3/uL (ref 3.4–10.8)

## 2023-06-24 NOTE — Patient Outreach (Signed)
Care Coordination   Follow Up Visit Note   07/08/2023 updated entry for 06/24/23 Name: Mikayla Jenkins MRN: [id number] DOB: July 09, 1946  Mikayla Jenkins is a 77 y.o. year old female who sees Mikayla Perches, MD for primary care. I spoke with  Mikayla Jenkins by phone today.  What matters to the patients health and wellness today?  Pill dispenser, Mikayla Jenkins, Low iron in recent lab, extra contacts causing eye redness+,  hypertension    Her family were able to obtain a pill dispenser for Mikayla Jenkins  Interest in completing Power of Attorney Mikayla Jenkins) forms. Agrees to meet at pcp office on 07/06/23 at 1:30 pm    Ferritin low  now on iron   Eye had 2-3 contacts in both her eyes-, corneal ulcer,  MD treated, no longer using contacts  Can see out of the left eye better will eventually get new glasses  Appetite patient not eating well, meals missed Voiced understanding of some options/resources to include nutritionist, megace, place food near her, using an alarm system reminder to eat- Mikayla Jenkins voiced interest in what the patient can eat on her low sodium heart healthy diet    Voiced understanding of some options for food security per low sodium heart healthy diet to include [employer], factory,  church visits. ask congregational nurse what have   Does not prefer meals on wheels    Goals Addressed             This Visit's Progress    Managing Diabetes, Cardiac illnesses, medicines, nutrition-care coordinator services       Interventions Today    Flowsheet Row Most Recent Value  Chronic Disease   Chronic disease during today's visit Other, Hypertension (HTN)  [pill dispenser to help her remember her medications at the right dose, & time, POA, low iron level, eye ulcer related to contact use, appetite concerns/meals]  General Interventions   General Interventions Discussed/Reviewed General Interventions Reviewed, Annual Eye Exam, Mikayla Jenkins, Doctor Visits, Communication with  Labs --   [iron]  Doctor Visits Discussed/Reviewed PCP, Doctor Visits Reviewed  PCP/Specialist Visits Compliance with follow-up visit  Communication with RN  Mikayla Jenkins sent to congregation nurse, Mikayla Jenkins]  Applications Other  [Mikayla Jenkins]  Exercise Interventions   Exercise Discussed/Reviewed Exercise Reviewed, Mikayla Jenkins  Weight Management Weight maintenance  Education Interventions   Education Provided Provided Web-based Education, Provided Education  Provided Engineer, petroleum On Nutrition, [employer], Labs, Air traffic controller, Mikayla Jenkins, Programmer, multimedia Other  [Mikayla Jenkins]  Mental Health Interventions   Mental Health Discussed/Reviewed Mental Health Reviewed, Coping Strategies  [nutrition information web base provided for heart healthy diet, mediaterran diet, dash diet, decreasing the use of salt]  Nutrition Interventions   Nutrition Discussed/Reviewed Nutrition Reviewed, Supplemental nutrition, Increasing proteins              SDOH assessments and interventions completed:  No     Care Coordination Interventions:  Yes, provided   Follow up plan: Follow up call scheduled for 07/06/23    Encounter Outcome:  Patient Visit Completed   Cala Bradford L. Noelle Penner, RN, BSN, CCM, Care Management Coordinator [phone]

## 2023-06-28 ENCOUNTER — Encounter: Payer: Self-pay | Admitting: Family Medicine

## 2023-06-28 NOTE — Assessment & Plan Note (Signed)
Followed by Nephrology, stable 

## 2023-06-28 NOTE — Assessment & Plan Note (Signed)
Mikayla Jenkins is reminded of the importance of commitment to daily physical activity for 30 minutes or more, as able and the need to limit carbohydrate intake to 30 to 60 grams per meal to help with blood sugar control.   The need to take medication as prescribed, test blood sugar as directed, and to call between visits if there is a concern that blood sugar is uncontrolled is also discussed.   Mikayla Jenkins is reminded of the importance of daily foot exam, annual eye examination, and good blood sugar, blood pressure and cholesterol control.     Latest Ref Rng & Units 06/21/2023    2:07 PM 06/17/2023    4:06 PM 05/04/2023    1:45 PM 03/29/2023    4:31 PM 03/22/2023    4:33 AM  Diabetic Labs  HbA1c 4.8 - 5.6 % 6.1       Chol 100 - 199 mg/dL 981       HDL >19 mg/dL 147       Calc LDL 0 - 99 mg/dL 64       Triglycerides 0 - 149 mg/dL 54       Creatinine 8.29 - 1.00 mg/dL 5.62  1.30  8.65  7.84  1.27       06/23/2023    4:18 PM 06/23/2023    4:15 PM 06/23/2023    3:52 PM 06/23/2023    3:21 PM 06/17/2023    3:29 PM 06/17/2023    2:58 PM 04/12/2023   12:35 PM  BP/Weight  Systolic BP 190 200 190 211 162 182 153  Diastolic BP 104 108 88 93 84 94 78  Wt. (Lbs)    129.04  132   BMI    21.47 kg/m2  21.97 kg/m2       Latest Ref Rng & Units 11/30/2022   12:00 AM 12/29/2020   12:00 AM  Foot/eye exam completion dates  Eye Exam No Retinopathy No Retinopathy     No Retinopathy         This result is from an external source.      Controlled, no change in medication

## 2023-06-28 NOTE — Progress Notes (Signed)
Mikayla Jenkins     MRN: [id number]      DOB: 1946-10-02  Chief Complaint  Patient presents with   Hypertension    F/u on heart failure and lab follow up.     HPI Mikayla Jenkins is here for follow up and re-evaluation of chronic medical conditions, medication management and review of any available recent lab and radiology data.  Preventive health is updated, specifically  Cancer screening and Immunization.   Cardiology is managing her hypertension which continues to be a challenge, and she is being followed closely by nephrology. The PT denies any adverse reactions to current medications since the last visit.  Discussion re naming health care POA carried out during the visit , she has 3 adult children one of whom is a pharmacist and accompanies pt to visits. Very appropriate and necessary and I encouraged her strongly to follow through with this   ROS No complaints/ concerns per pt Denies recent fever or chills. Denies sinus pressure, nasal congestion, ear pain or sore throat. Denies chest congestion, productive cough or wheezing. Denies chest pains, palpitations and leg swelling Denies abdominal pain, nausea, vomiting,diarrhea or constipation.   Denies dysuria, frequency, hesitancy or incontinence. Denies uncontrolled  joint pain, swelling and limitation in mobility. Denies headaches, seizures, numbness, or tingling. Denies depression, anxiety or insomnia. Denies skin break down or rash.   PE  BP (!) 190/104   Pulse 68   Ht 5\' 5"  (1.651 m)   Wt 129 lb 0.6 oz (58.5 kg)   SpO2 96%   BMI 21.47 kg/m   Patient alert and oriented .  HEENT: No facial asymmetry, EOMI,     Neck supple .  Chest: Clear to auscultation bilaterally.  CVS: S1, S2 no murmurs, no S3.Regular rate.  ABD: Soft non tender.   Ext: No edema  MS: Adequate ROM spine, shoulders, hips and knees.  Skin: Intact, no ulcerations or rash noted.  Psych: Good eye contact, normal affect.  CNS: CN 2-12 intact,  power,  normal throughout.no focal deficits noted.   Assessment & Plan  HTN (hypertension) Elevated at visit, daughter reports good values at home, she does hav white coat HTN, however, I believe that her values are truly elevated, daughter monitors closely andis communication with cardiology Management is per Cardiology   CKD (chronic kidney disease), stage III (HCC) Followed by Nephrology, stable  OSA (obstructive sleep apnea) Managed by pulmonary and compliant  Hyperlipidemia associated with type 2 diabetes mellitus (HCC) Hyperlipidemia:Low fat diet discussed and encouraged.   Lipid Panel  Lab Results  Component Value Date   CHOL 185 06/21/2023   HDL 110 06/21/2023   LDLCALC 64 06/21/2023   TRIG 54 06/21/2023   CHOLHDL 1.7 06/21/2023     Controlled, no change in medication   Type 2 diabetes mellitus with nephropathy [facility]) Mikayla Jenkins is reminded of the importance of commitment to daily physical activity for 30 minutes or more, as able and the need to limit carbohydrate intake to 30 to 60 grams per meal to help with blood sugar control.   The need to take medication as prescribed, test blood sugar as directed, and to call between visits if there is a concern that blood sugar is uncontrolled is also discussed.   Mikayla Jenkins is reminded of the importance of daily foot exam, annual eye examination, and good blood sugar, blood pressure and cholesterol control.     Latest Ref Rng & Units 06/21/2023    2:07 PM  06/17/2023    4:06 PM 05/04/2023    1:45 PM 03/29/2023    4:31 PM 03/22/2023    4:33 AM  Diabetic Labs  HbA1c 4.8 - 5.6 % 6.1       Chol 100 - 199 mg/dL 557       HDL >32 mg/dL 202       Calc LDL 0 - 99 mg/dL 64       Triglycerides 0 - 149 mg/dL 54       Creatinine 5.42 - 1.00 mg/dL 7.06  2.37  6.28  3.15  1.27       06/23/2023    4:18 PM 06/23/2023    4:15 PM 06/23/2023    3:52 PM 06/23/2023    3:21 PM 06/17/2023    3:29 PM 06/17/2023    2:58 PM 04/12/2023   12:35 PM   BP/Weight  Systolic BP 190 200 190 211 162 182 153  Diastolic BP 104 108 88 93 84 94 78  Wt. (Lbs)    129.04  132   BMI    21.47 kg/m2  21.97 kg/m2       Latest Ref Rng & Units 11/30/2022   12:00 AM 12/29/2020   12:00 AM  Foot/eye exam completion dates  Eye Exam No Retinopathy No Retinopathy     No Retinopathy         This result is from an external source.      Controlled, no change in medication

## 2023-06-28 NOTE — Assessment & Plan Note (Signed)
Managed by pulmonary and compliant

## 2023-06-28 NOTE — Assessment & Plan Note (Signed)
Elevated at visit, daughter reports good values at home, she does hav white coat HTN, however, I believe that her values are truly elevated, daughter monitors closely andis communication with cardiology Management is per Cardiology

## 2023-06-28 NOTE — Assessment & Plan Note (Signed)
Hyperlipidemia:Low fat diet discussed and encouraged.   Lipid Panel  Lab Results  Component Value Date   CHOL 185 06/21/2023   HDL 110 06/21/2023   LDLCALC 64 06/21/2023   TRIG 54 06/21/2023   CHOLHDL 1.7 06/21/2023     Controlled, no change in medication

## 2023-07-06 ENCOUNTER — Ambulatory Visit: Payer: Self-pay | Admitting: *Deleted

## 2023-07-06 ENCOUNTER — Other Ambulatory Visit (HOSPITAL_COMMUNITY)
Admission: RE | Admit: 2023-07-06 | Discharge: 2023-07-06 | Disposition: A | Discharge status: Home / Self Care | Payer: Medicare HMO | Source: Ambulatory Visit | Attending: Student | Admitting: Student

## 2023-07-06 DIAGNOSIS — Z79899 Other long term (current) drug therapy: Secondary | ICD-10-CM | POA: Insufficient documentation

## 2023-07-06 LAB — BASIC METABOLIC PANEL WITH GFR
Anion gap: 9 (ref 5–15)
BUN: 14 mg/dL (ref 8–23)
CO2: 25 mmol/L (ref 22–32)
Calcium: 9.4 mg/dL (ref 8.9–10.3)
Chloride: 104 mmol/L (ref 98–111)
Creatinine, Ser: 1.26 mg/dL — ABNORMAL HIGH (ref 0.44–1.00)
GFR, Estimated: 44 mL/min — ABNORMAL LOW (ref >60)
Glucose, Bld: 151 mg/dL — ABNORMAL HIGH (ref 70–99)
Potassium: 3.3 mmol/L — ABNORMAL LOW (ref 3.5–5.1)
Sodium: 138 mmol/L (ref 135–145)

## 2023-07-06 NOTE — Patient Outreach (Signed)
Care Coordination   In person meeting   Visit Note   07/06/2023 Name: Mikayla Jenkins MRN: [id number] DOB: 01-07-1946  Mikayla Jenkins is a 77 y.o. year old female who sees Mikayla Perches, MD for primary care. I met with  Mikayla Jenkins and her daughter Mikayla Jenkins today at [place] primary care office.  What matters to the patients health and wellness today?  POA forms completed Patient family elected to complete living will at another time    Goals Addressed             This Visit's Progress    [facility] care coordinator services (Diabetes, Cardiac illnesses, medicines, nutrition))   On track    Interventions Today    Flowsheet Row Most Recent Value  Chronic Disease   Chronic disease during today's visit Other  [POA forms completion]  General Interventions   General Interventions Discussed/Reviewed General Interventions Reviewed, [employer], Communication with  Communication with Investment banker, corporate with notary, Jonelle Sports, 2 witnesses]  Applications Other  [completion of POA form]  Education Interventions   Applications Other  [completion of POA form]  Advanced Directive Interventions   Advanced Directives Discussed/Reviewed Advanced Directives Reviewed, Advanced Care Planning  [POA forms completed by patient and daughter, notarized and witnessed. Copy of form provided to the office and family will take another copy to the local clertk of court]              SDOH assessments and interventions completed:  No     Care Coordination Interventions:  Yes, provided   Follow up plan:  Mikayla Jenkins will follow up with the next appointment time, Focus will be on Mr Sangiacomo at this time (hospitalized)     Encounter Outcome:  Patient Visit Completed   Cala Bradford L. Noelle Penner, RN, BSN, CCM, Care Management Coordinator [phone]

## 2023-07-06 NOTE — Patient Instructions (Addendum)
Visit Information  Thank you for taking time to visit with me today. Please don't hesitate to contact me if I can be of assistance to you.   Continued prayers!   Following are the goals we discussed today:   Goals Addressed             This Visit's Progress    [facility] care coordinator services (Diabetes, Cardiac illnesses, medicines, nutrition))   On track    Interventions Today    Flowsheet Row Most Recent Value  Chronic Disease   Chronic disease during today's visit Other  [POA forms completion]  General Interventions   General Interventions Discussed/Reviewed General Interventions Reviewed, [employer], Communication with  Communication with Investment banker, corporate with notary, Jonelle Sports, 2 witnesses]  Applications Other  [completion of POA form]  Education Interventions   Applications Other  [completion of POA form]  Advanced Directive Interventions   Advanced Directives Discussed/Reviewed Advanced Directives Reviewed, Advanced Care Planning  [POA forms completed by patient and daughter, notarized and witnessed. Copy of form provided to the office and family will take another copy to the local clertk of court]              Our next appointment is by telephone on [name] will follow up at a more befitting time   Please call the care guide team at [phone] if you need to cancel or reschedule your appointment.   If you are experiencing a Mental Health or Behavioral Health Crisis or need someone to talk to, please call the Suicide and Crisis Lifeline: 988 call the [place] National Suicide Prevention Lifeline: 702-079-2483 or TTY: 6362122911 TTY 310-638-3349) to talk to a trained counselor call 1-800-273-TALK (toll free, 24 hour hotline) call the [facility]: [phone] call 911   Patient verbalizes understanding of instructions and care plan provided today and agrees to view in MyChart. Active MyChart status and patient understanding  of how to access instructions and care plan via MyChart confirmed with patient.     The patient has been provided with contact information for the care management team and has been advised to call with any health related questions or concerns.   Jesus Poplin L. Noelle Penner, RN, BSN, CCM, Care Management Coordinator [phone]

## 2023-07-08 ENCOUNTER — Telehealth: Payer: Self-pay

## 2023-07-08 DIAGNOSIS — Z79899 Other long term (current) drug therapy: Secondary | ICD-10-CM

## 2023-07-08 NOTE — Telephone Encounter (Signed)
-----   Message from Ellsworth Lennox sent at 07/06/2023  4:43 PM EDT ----- Please let the patient and her daughter know that her kidney function remains stable with creatinine at 1.26.  Potassium is low at 3.3. I would recommend increasing her K-dur to 40 mEq on the days she takes Lasix and she should take an extra 40 mEq on the day she is able to pick-up the Rx. Repeat BMET in 2 weeks.

## 2023-07-08 NOTE — Telephone Encounter (Signed)
Left message with daughter to return call.

## 2023-07-08 NOTE — Patient Instructions (Addendum)
Visit Information  Thank you for taking time to visit with me today. Please don't hesitate to contact me if I can be of assistance to you.    Please utilize this site to help with food choices for a heart healthy diet [email or web address]   Following are the goals we discussed today:   Goals Addressed             This Visit's Progress    Managing Diabetes, Cardiac illnesses, medicines, nutrition-care coordinator services       Interventions Today    Flowsheet Row Most Recent Value  Chronic Disease   Chronic disease during today's visit Other, Hypertension (HTN)  [pill dispenser to help her remember her medications at the right dose, & time, POA, low iron level, eye ulcer related to contact use, appetite concerns/meals]  General Interventions   General Interventions Discussed/Reviewed General Interventions Reviewed, Annual Eye Exam, [employer], Doctor Visits, Communication with  Labs --  [iron]  Doctor Visits Discussed/Reviewed PCP, Doctor Visits Reviewed  PCP/Specialist Visits Compliance with follow-up visit  Communication with RN  [employer] sent to congregation nurse, W Williamson]  Applications Other  [HPOA]  Exercise Interventions   Exercise Discussed/Reviewed Exercise Reviewed, [employer]  Weight Management Weight maintenance  Education Interventions   Education Provided Provided Web-based Education, Provided Education  Provided Engineer, petroleum On Nutrition, [employer], [employer], Air traffic controller, [employer], Other  Applications Other  [HPOA]  Mental Health Interventions   Mental Health Discussed/Reviewed Mental Health Reviewed, Coping Strategies  [nutrition information web base provided for heart healthy diet, mediaterran diet, dash diet, decreasing the use of salt]  Nutrition Interventions   Nutrition Discussed/Reviewed Nutrition Reviewed, Supplemental nutrition, Increasing proteins               Our next appointment is by telephone on 07/06/23 at 1:30 pm   Please call the care guide team at [phone] if you need to cancel or reschedule your appointment.   If you are experiencing a Mental Health or Behavioral Health Crisis or need someone to talk to, please call the Suicide and Crisis Lifeline: 988 call the [place] National Suicide Prevention Lifeline: 775 661 6562 or TTY: 385-172-9678 TTY 681-803-7344) to talk to a trained counselor call 1-800-273-TALK (toll free, 24 hour hotline) call the [facility]: [phone] call 911   Patient verbalizes understanding of instructions and care plan provided today and agrees to view in MyChart. Active MyChart status and patient understanding of how to access instructions and care plan via MyChart confirmed with patient.     The patient has been provided with contact information for the care management team and has been advised to call with any health related questions or concerns.   Cordelro Gautreau L. Noelle Penner, RN, BSN, CCM, Care Management Coordinator [phone]

## 2023-07-11 NOTE — Telephone Encounter (Signed)
Patient's daughter is returning phone call.

## 2023-07-12 ENCOUNTER — Encounter: Payer: Self-pay | Admitting: *Deleted

## 2023-07-12 NOTE — Telephone Encounter (Signed)
Daughter states she did not increase her potassium because her pcp had done lab work at [facility] on 9/19 and her K+ was 4.1.   I am not aware of this result,please advise

## 2023-07-12 NOTE — Telephone Encounter (Signed)
Labs requested from PCP.

## 2023-07-27 DIAGNOSIS — D638 Anemia in other chronic diseases classified elsewhere: Secondary | ICD-10-CM | POA: Diagnosis not present

## 2023-07-27 DIAGNOSIS — E1122 Type 2 diabetes mellitus with diabetic chronic kidney disease: Secondary | ICD-10-CM | POA: Diagnosis not present

## 2023-07-27 DIAGNOSIS — I129 Hypertensive chronic kidney disease with stage 1 through stage 4 chronic kidney disease, or unspecified chronic kidney disease: Secondary | ICD-10-CM | POA: Diagnosis not present

## 2023-07-27 DIAGNOSIS — N189 Chronic kidney disease, unspecified: Secondary | ICD-10-CM | POA: Diagnosis not present

## 2023-07-27 DIAGNOSIS — I5032 Chronic diastolic (congestive) heart failure: Secondary | ICD-10-CM | POA: Diagnosis not present

## 2023-07-27 DIAGNOSIS — D649 Anemia, unspecified: Secondary | ICD-10-CM | POA: Diagnosis not present

## 2023-08-05 DIAGNOSIS — I5032 Chronic diastolic (congestive) heart failure: Secondary | ICD-10-CM | POA: Diagnosis not present

## 2023-08-05 DIAGNOSIS — E1122 Type 2 diabetes mellitus with diabetic chronic kidney disease: Secondary | ICD-10-CM | POA: Diagnosis not present

## 2023-08-05 DIAGNOSIS — I129 Hypertensive chronic kidney disease with stage 1 through stage 4 chronic kidney disease, or unspecified chronic kidney disease: Secondary | ICD-10-CM | POA: Diagnosis not present

## 2023-08-05 DIAGNOSIS — E876 Hypokalemia: Secondary | ICD-10-CM | POA: Diagnosis not present

## 2023-08-10 ENCOUNTER — Ambulatory Visit: Payer: Medicare HMO | Attending: Student | Admitting: Student

## 2023-08-10 ENCOUNTER — Encounter: Payer: Self-pay | Admitting: Student

## 2023-08-10 VITALS — BP 152/94 | HR 67 | Ht 67.0 in | Wt 128.6 lb

## 2023-08-10 DIAGNOSIS — I10 Essential (primary) hypertension: Secondary | ICD-10-CM

## 2023-08-10 DIAGNOSIS — I5181 Takotsubo syndrome: Secondary | ICD-10-CM

## 2023-08-10 DIAGNOSIS — E785 Hyperlipidemia, unspecified: Secondary | ICD-10-CM | POA: Diagnosis not present

## 2023-08-10 DIAGNOSIS — I513 Intracardiac thrombosis, not elsewhere classified: Secondary | ICD-10-CM

## 2023-08-10 DIAGNOSIS — N1832 Chronic kidney disease, stage 3b: Secondary | ICD-10-CM | POA: Diagnosis not present

## 2023-08-10 DIAGNOSIS — I214 Non-ST elevation (NSTEMI) myocardial infarction: Secondary | ICD-10-CM | POA: Diagnosis not present

## 2023-08-10 DIAGNOSIS — I5021 Acute systolic (congestive) heart failure: Secondary | ICD-10-CM | POA: Diagnosis not present

## 2023-08-10 MED ORDER — LOSARTAN POTASSIUM 100 MG PO TABS: 100.0000 mg | ORAL_TABLET | Freq: Every day | ORAL | 3 refills | Status: DC

## 2023-08-10 NOTE — Progress Notes (Signed)
Cardiology Office Note    Date:  08/10/2023  ID:  Mikayla, Jenkins 12-07-1945, MRN [id number] Cardiologist: Marjo Bicker, MD    History of Present Illness:    Mikayla Jenkins is a 77 y.o. female with past medical history of HTN, HLD, Type 2 DM, Stage 3 CKD, OSA (on CPAP) and HFrEF/Takotsubo Cardiomyopathy with LV thrombus (diagnosed in 03/2023 with EF 25-30% by echo and 41% by cMRI) who presents to the office today for 61-month follow-up.  She was examined by myself in 05/2023 and reported overall feeling well at that time and denied any recent respiratory issues. She was not using her CPAP consistently and the importance of use was reviewed. Her family was concerned about dehydration given her variable PO intake and was recommended that she try taking Lasix 20 mg twice weekly. She was continued on Losartan 50 mg daily and Coreg 6.25 mg twice daily. Her daughter did not wish for her to be on Entresto given concerns for dehydration and worsening renal function and she had previously been intolerant to Spironolactone and SGLT2 inhibitors as she developed AKI's with both in the past.  In talking with the patient and her daughter today, she reports overall feeling well since her last visit.  She denies any recent chest pain or dyspnea on exertion. No recent palpitations, orthopnea, PND or pitting edema. They have been under increased stress as the patient's husband was recently hospitalized for over a month and diagnosed with cancer at that time. He is now home but requiring advanced therapies and is on TPN. They have been checking her blood pressure intermittently at home and this has been elevated and BP was at 150/100 during today's visit with similar values by repeat check.  Studies Reviewed:   EKG: EKG is not ordered today.  Cardiac Catheterization: 02/2023 Angiographically normal coronary artery disease with no obvious culprit lesion. Codominant system. Difficult to image left  ventriculography, would recommend 2D echo but appears to be normal EF. Normal EDP.      Plan: Will need to consider different etiology for troponin elevation however for now we will go ahead and treat as though this could have been ACS with a lesion that cleared with nitroglycerin, aspirin and heparin.. For now we will treat with DAPT and 40 hours of IV heparin until other etiology is determined. Check 2D echo to better assess EF and consider cardiac MRI as there is concern for troponin elevation and her discomfort being related to myocarditis. Will continue most home medications but hold the thiazide diuretic and clonidine nightly. Sliding-scale insulin. Standard labs checked with A1c, lipid panel etc.     Anticipate relatively short stay depending additional evaluation.    Limited Echo: 04/2023 IMPRESSIONS     1. Left ventricular ejection fraction, by estimation, is 40%. The left  ventricle has mildly decreased function. The left ventricle demonstrates  global hypokinesis. Left ventricular diastolic parameters are consistent  with Grade I diastolic dysfunction  (impaired relaxation). No LV thrombus was visualized.   2. Right ventricular systolic function is mildly reduced. The right  ventricular size is normal. Tricuspid regurgitation signal is inadequate  for assessing PA pressure.   3. Left atrial size was moderately dilated.   4. The mitral valve is normal in structure. Trivial mitral valve  regurgitation. No evidence of mitral stenosis.   5. The aortic valve is tricuspid. Aortic valve regurgitation is not  visualized. No aortic stenosis is present.   6. Pulmonic valve  regurgitation is moderate.   7. The inferior vena cava is normal in size with greater than 50%  respiratory variability, suggesting right atrial pressure of 3 mmHg.    Risk Assessment/Calculations:     HYPERTENSION CONTROL Vitals:   08/10/23 1446 08/10/23 1502  BP: (!) 150/100 (!) 152/94    The  patient's blood pressure is elevated above target today.  In order to address the patient's elevated BP: A current anti-hypertensive medication was adjusted today.        Physical Exam:   VS:  BP (!) 152/94   Pulse 67   Ht 5\' 7"  (1.702 m)   Wt 128 lb 9.6 oz (58.3 kg)   SpO2 100%   BMI 20.14 kg/m    Wt Readings from Last 3 Encounters:  08/10/23 128 lb 9.6 oz (58.3 kg)  06/23/23 129 lb 0.6 oz (58.5 kg)  06/17/23 132 lb (59.9 kg)     GEN: Well nourished, well developed female appearing in no acute distress NECK: No JVD; No carotid bruits CARDIAC: RRR, no murmurs, rubs, gallops RESPIRATORY:  Clear to auscultation without rales, wheezing or rhonchi  ABDOMEN: Appears non-distended. No obvious abdominal masses. EXTREMITIES: No clubbing or cyanosis. No pitting edema.  Distal pedal pulses are 2+ bilaterally.   Assessment and Plan:   1. Chronic HFrEF/Takotsubo Cardiomyopathy - Her EF was at 40% by most recent imaging in 04/2023. She appears euvolemic by examination today and denies any recent respiratory issues. Will continue current medical therapy with Lasix 20 mg twice weekly and Coreg 6.25 mg twice daily. Will increase Losartan 100 mg daily as outlined below. Her daughter has wished for her to avoid Entresto and she was previously intolerant to Spironolactone and SGLT2 inhibitors as she developed AKI's with these in the past. Could consider the use of Hydralazine going forward if needed for additional BP control. Unable to previously titrate Coreg due to bradycardia at times. Will plan for a follow-up limited echocardiogram for reassessment of her EF in 09/2023.  2. History of NSTEMI - This occurred in 02/2023 and cardiac catheterization at that time showed normal coronary arteries.  She denies any recent anginal symptoms. Continue with risk factor modification.  3. HTN - Her BP was initially recorded at 150/100, rechecked and at 152/94. Continue Coreg 6.25 mg twice daily and will  titrate Losartan from 75 mg daily to 100 mg daily. Repeat BMET in 2 weeks.   4. HLD - FLP in 06/2023 showed her LDL was at 64. Continue current medical therapy with Crestor 40 mg daily.  5. Stage 3 CKD - Baseline creatinine 1.2 - 1.3. This was stable at 1.26 when checked on 07/06/2023.  6. History of LV Thrombus - This had resolved by repeat imaging and she did complete a 37-month course of Eliquis. No longer on anticoagulation.   Signed, Ellsworth Lennox, PA-C

## 2023-08-10 NOTE — Patient Instructions (Signed)
Medication Instructions:  Increase Losartan to 100 mg Daily   *If you need a refill on your cardiac medications before your next appointment, please call your pharmacy*   Lab Work: Your physician recommends that you return for lab work in: 2 Weeks   If you have labs (blood work) drawn today and your tests are completely normal, you will receive your results only by: MyChart Message (if you have MyChart) OR A paper copy in the mail If you have any lab test that is abnormal or we need to change your treatment, we will call you to review the results.   Testing/Procedures: Your physician has requested that you have an echocardiogram. Echocardiography is a painless test that uses sound waves to create images of your heart. It provides your doctor with information about the size and shape of your heart and how well your heart's chambers and valves are working. This procedure takes approximately one hour. There are no restrictions for this procedure. Please do NOT wear cologne, perfume, aftershave, or lotions (deodorant is allowed). Please arrive 15 minutes prior to your appointment time.    Follow-Up: At [facility], you and your health needs are our priority.  As part of our continuing mission to provide you with exceptional heart care, we have created designated Provider Care Teams.  These Care Teams include your primary Cardiologist (physician) and Advanced Practice Providers (APPs -  Physician Assistants and Nurse Practitioners) who all work together to provide you with the care you need, when you need it.  We recommend signing up for the patient portal called "MyChart".  Sign up information is provided on this After Visit Summary.  MyChart is used to connect with patients for Virtual Visits (Telemedicine).  Patients are able to view lab/test results, encounter notes, upcoming appointments, etc.  Non-urgent messages can be sent to your provider as well.   To learn more about what  you can do with MyChart, go to [email or web address].    Your next appointment:   3 -4 month(s)  Provider:   Luane School, MD or Randall An, PA-C    Other Instructions Thank you for choosing [facility]!

## 2023-08-16 ENCOUNTER — Ambulatory Visit: Payer: Self-pay | Admitting: *Deleted

## 2023-08-16 NOTE — Patient Outreach (Signed)
  Care Coordination   Follow Up Visit Note   11/16/2023 updates for 08/16/23 Name: Mikayla Jenkins MRN: [id number] DOB: 1946/01/24  Mikayla Jenkins is a 77 y.o. year old female who sees Kerri Perches, MD for primary care. I spoke with [name], daughter  FAUSTINE TATES by phone today.  What matters to the patients health and wellness today?  Concern with her Spouse's returned to hospital with problems breathing nausea/vomiting, ileostomy issues- Denies worsening issues of her own that is not manageable at home or assist of daughter  Patient with increase aggressive arguing/verbalization, but no physical aggression. Has not seen a neurologist. Triggered by spouse's recent discharge home. Some personality & increased dementia forgetfulness, hiding items from self, forgetting to eat. No expressed worsen cardiac symptoms  Family never leaves her alone Community, making items visible, organize refrigerator where she can see  Spouse with Drainage tube  pmh pulmonary edema after [facility]  left pulmonary thoracentesis of left side Now right lung has more fluid  Perineal  issues  Seen by [facility] palliative care for pain management.  Albumin being given  On Lidocaine patch/morphine for breakdown pain. Most pain in abdominal surgery incision.TPN at home with ileostomy with option care Spirits up not candidate for hospice  Daughter & RN CM reviewed Caregiver respite -help from children.  Supportive family    Goals Addressed             This Visit's Progress    Managing Diabetes, Cardiac illnesses, medicines, nutrition-care coordinator services   On track     Interventions for 08/16/23 Interventions Today    Flowsheet Row Most Recent Value  Chronic Disease   Chronic disease during today's visit Other  [Concern with her Spouse's returned to hospital with problems breathing nausea/vomiting, ileostomy issues- Denies worsening issues of her own that is not manageable at home or assist of daughter]   General Interventions   General Interventions Discussed/Reviewed General Interventions Reviewed, Doctor Visits, Community Resources  Doctor Visits Discussed/Reviewed Doctor Visits Reviewed, PCP, Specialist  [confirmed still has not seen neurologist]  PCP/Specialist Visits Compliance with follow-up visit  Exercise Interventions   Exercise Discussed/Reviewed Exercise Reviewed, Physical Activity  Mental Health Interventions   Mental Health Discussed/Reviewed Mental Health Reviewed, Other  [dementia, care giver concerns]             SDOH assessments and interventions completed:  No     Care Coordination Interventions:  Yes, provided   Follow up plan: Follow up call scheduled for 11/16/23    Encounter Outcome:  Patient Visit Completed   Cala Bradford L. Noelle Penner, RN, BSN, CCM [facility]  RN Care coordinator Direct Dial: [phone]

## 2023-09-07 ENCOUNTER — Other Ambulatory Visit: Payer: Self-pay | Admitting: Internal Medicine

## 2023-09-28 ENCOUNTER — Ambulatory Visit (HOSPITAL_COMMUNITY)
Admission: RE | Admit: 2023-09-28 | Discharge: 2023-09-28 | Disposition: A | Discharge status: Home / Self Care | Payer: Medicare HMO | Source: Ambulatory Visit | Attending: Student | Admitting: Student

## 2023-09-28 DIAGNOSIS — I5181 Takotsubo syndrome: Secondary | ICD-10-CM | POA: Diagnosis not present

## 2023-09-28 DIAGNOSIS — I428 Other cardiomyopathies: Secondary | ICD-10-CM | POA: Diagnosis not present

## 2023-09-28 LAB — ECHOCARDIOGRAM LIMITED
Calc EF: 52.9 %
Single Plane A2C EF: 43.6 %
Single Plane A4C EF: 62.6 %

## 2023-09-29 ENCOUNTER — Encounter: Payer: Self-pay | Admitting: Family Medicine

## 2023-09-30 ENCOUNTER — Other Ambulatory Visit: Payer: Self-pay

## 2023-09-30 DIAGNOSIS — N1831 Chronic kidney disease, stage 3a: Secondary | ICD-10-CM

## 2023-09-30 DIAGNOSIS — E1121 Type 2 diabetes mellitus with diabetic nephropathy: Secondary | ICD-10-CM

## 2023-09-30 DIAGNOSIS — E538 Deficiency of other specified B group vitamins: Secondary | ICD-10-CM

## 2023-10-03 DIAGNOSIS — E1121 Type 2 diabetes mellitus with diabetic nephropathy: Secondary | ICD-10-CM | POA: Diagnosis not present

## 2023-10-03 DIAGNOSIS — E538 Deficiency of other specified B group vitamins: Secondary | ICD-10-CM | POA: Diagnosis not present

## 2023-10-03 DIAGNOSIS — N1831 Chronic kidney disease, stage 3a: Secondary | ICD-10-CM | POA: Diagnosis not present

## 2023-10-04 ENCOUNTER — Ambulatory Visit: Payer: Medicare HMO | Admitting: Family Medicine

## 2023-10-04 ENCOUNTER — Other Ambulatory Visit (HOSPITAL_COMMUNITY): Payer: Self-pay | Admitting: Family Medicine

## 2023-10-04 DIAGNOSIS — Z1231 Encounter for screening mammogram for malignant neoplasm of breast: Secondary | ICD-10-CM

## 2023-10-04 LAB — BMP8+EGFR
BUN/Creatinine Ratio: 17 (ref 12–28)
BUN: 20 mg/dL (ref 8–27)
CO2: 22 mmol/L (ref 20–29)
Calcium: 9.6 mg/dL (ref 8.7–10.3)
Chloride: 101 mmol/L (ref 96–106)
Creatinine, Ser: 1.2 mg/dL — ABNORMAL HIGH (ref 0.57–1.00)
Glucose: 115 mg/dL — ABNORMAL HIGH (ref 70–99)
Potassium: 4.2 mmol/L (ref 3.5–5.2)
Sodium: 141 mmol/L (ref 134–144)
eGFR: 46 mL/min/{1.73_m2} — ABNORMAL LOW (ref >59)

## 2023-10-04 LAB — HEMOGLOBIN A1C
Est. average glucose Bld gHb Est-mCnc: 131 mg/dL
Hgb A1c MFr Bld: 6.2 % — ABNORMAL HIGH (ref 4.8–5.6)

## 2023-10-04 LAB — VITAMIN B12: Vitamin B-12: 951 pg/mL (ref 232–1245)

## 2023-10-06 ENCOUNTER — Ambulatory Visit (INDEPENDENT_AMBULATORY_CARE_PROVIDER_SITE_OTHER): Payer: Medicare HMO | Admitting: Family Medicine

## 2023-10-06 ENCOUNTER — Encounter: Payer: Self-pay | Admitting: Family Medicine

## 2023-10-06 ENCOUNTER — Ambulatory Visit (INDEPENDENT_AMBULATORY_CARE_PROVIDER_SITE_OTHER): Payer: Medicare HMO | Admitting: Gastroenterology

## 2023-10-06 VITALS — BP 158/80 | HR 67 | Ht 67.0 in | Wt 135.0 lb

## 2023-10-06 VITALS — BP 154/73 | HR 64 | Temp 97.9°F | Ht 67.0 in | Wt 135.0 lb

## 2023-10-06 DIAGNOSIS — I10 Essential (primary) hypertension: Secondary | ICD-10-CM

## 2023-10-06 DIAGNOSIS — E1121 Type 2 diabetes mellitus with diabetic nephropathy: Secondary | ICD-10-CM

## 2023-10-06 DIAGNOSIS — E785 Hyperlipidemia, unspecified: Secondary | ICD-10-CM | POA: Diagnosis not present

## 2023-10-06 DIAGNOSIS — R6881 Early satiety: Secondary | ICD-10-CM

## 2023-10-06 DIAGNOSIS — Z09 Encounter for follow-up examination after completed treatment for conditions other than malignant neoplasm: Secondary | ICD-10-CM | POA: Diagnosis not present

## 2023-10-06 DIAGNOSIS — N1831 Chronic kidney disease, stage 3a: Secondary | ICD-10-CM | POA: Diagnosis not present

## 2023-10-06 DIAGNOSIS — Z8719 Personal history of other diseases of the digestive system: Secondary | ICD-10-CM | POA: Diagnosis not present

## 2023-10-06 MED ORDER — OMEPRAZOLE 20 MG PO CPDR: 20.0000 mg | DELAYED_RELEASE_CAPSULE | Freq: Every day | ORAL | 3 refills | Status: DC

## 2023-10-06 NOTE — Patient Instructions (Signed)
F/u in 4 months, call if you need me sooner  Improvement in blood sugar, blood pressure , kidney and heart function, which is excellent!  My condolence re your recent loss  No changes in medications   Please get fasting lipid, cmp and EGFr and hBA1C and TSH 3 to 5 days before your scheduled April visit ( if no vit D level has been checked by that time pls send a message , I will order the test)  Thanks for choosing [facility], we consider it a privelige to serve you.  Best for 2025!

## 2023-10-06 NOTE — Patient Instructions (Signed)
Im glad you are feeling better and happy to hear your appetite has improved We will try to decrease omeprazole to 20mg  once daily Please let me know if you have recurrence of symptoms on lower dose and we can go back to 40mg  if needed  If blood pressure remains elevated, please follow up with your PCP regarding this  Follow up 6 months  It was a pleasure to see you today. I want to create trusting relationships with patients and provide genuine, compassionate, and quality care. I truly value your feedback! please be on the lookout for a survey regarding your visit with me today. I appreciate your input about our visit and your time in completing this!    Geralynn Capri L. Jeanmarie Hubert, MSN, APRN, AGNP-C Adult-Gerontology Nurse Practitioner [facility]

## 2023-10-06 NOTE — Progress Notes (Addendum)
Referring Provider: Kerri Perches, MD Primary Care Physician:  Kerri Perches, MD Primary GI Physician: Dr. Levon Hedger   Chief Complaint  Patient presents with   Weight Loss    Follow up on weight loss. Has been able to eat more and not filling up as fast as she did before.    HPI:   Mikayla Jenkins is a 77 y.o. female with past medical history of DM II, HLD, HTN, renal disorder   Patient presenting today for follow-up of weight loss and early satiety   Patient last seen June 2024, at that time patient doing better since starting omeprazole and eating better.  Has lost some weight due to recent hospitalization and cardiac issues.  Appetite seem to be improving, patient doing a few boost per week.  Recommend patient continue with omeprazole 40 mg daily, may consider 7 down to 20 mg daily at next visit, consider GES if early satiety recurs.  Present: Has gained some weight since last visit, now 135 lbs, up from 128 in October  Patient's daughter states appetite is  much better. She ate 2 eggs, a waffle, 2 chicken strips and oatmeal for breakfast this morning. Patient reports early satiety has resolved. No abdominal pain, nausea, vomiting. Constipation or diarrhea. Denies any GERD symptoms, no dysphagia or odynophagia. No blood in stools or black stools.    EGD: 12/2022 - 2 cm hiatal hernia. - Gastritis. Biopsied. - Normal examined duodenum. Cologuard: 10/2019 negative  Last Colonoscopy:2016 Examination performed to cecum. Redundant colon with no evidence of colonic polyps. Small internal and external hemorrhoids.   Past Medical History:  Diagnosis Date   Diabetes mellitus type II    Heart murmur    Hyperlipidemia    Hypertension    Renal disorder    Sleep apnea     Past Surgical History:  Procedure Laterality Date   ABDOMINAL HYSTERECTOMY     BIOPSY  12/28/2022   Procedure: BIOPSY;  Surgeon: Dolores Frame, MD;  Location: [facility];  Service:  Gastroenterology;;   COLONOSCOPY N/A 06/13/2015   Procedure: COLONOSCOPY;  Surgeon: Malissa Hippo, MD;  Location: [facility];  Service: Endoscopy;  Laterality: N/A;  930-rescheduled 8/26 @ 8:30am Ann to notify pt   CORONARY/GRAFT ACUTE MI REVASCULARIZATION N/A 03/16/2023   Procedure: Coronary/Graft Acute MI Revascularization;  Surgeon: Marykay Lex, MD;  Location[facility] INVASIVE CV LAB;  Service: Cardiovascular;  Laterality: N/A;   ESOPHAGOGASTRODUODENOSCOPY (EGD) WITH PROPOFOL N/A 12/28/2022   Procedure: ESOPHAGOGASTRODUODENOSCOPY (EGD) WITH PROPOFOL;  Surgeon: Dolores Frame, MD;  Location: [facility];  Service: Gastroenterology;  Laterality: N/A;  115pm, asa 3   HERNIA REPAIR     inguinal herniorrhapy right     LEFT HEART CATH AND CORONARY ANGIOGRAPHY N/A 03/16/2023   Procedure: LEFT HEART CATH AND CORONARY ANGIOGRAPHY;  Surgeon: Marykay Lex, MD;  Location: [facility] INVASIVE CV LAB;  Service: Cardiovascular;  Laterality: N/A;   right eye surgery secondary to right eye weakness     SALPINGOOPHORECTOMY     TOTAL HIP ARTHROPLASTY Right 01/06/2021   TOTAL HIP ARTHROPLASTY Right 01/06/2021   Procedure: RIGHT TOTAL HIP ARTHROPLASTY ANTERIOR APPROACH;  Surgeon: Kathryne Hitch, MD;  Location: [facility] OR;  Service: Orthopedics;  Laterality: Right;   TUBAL LIGATION      Current Outpatient Medications  Medication Sig Dispense Refill   acetaminophen (TYLENOL) 650 MG CR tablet Take 650 mg by mouth every 8 (eight) hours as needed for pain.  blood glucose meter kit and supplies Dispense based on patient and insurance preference. Use up to four times daily as directed. (FOR ICD-10 E10.9, E11.9). (Patient not taking: Reported on 08/10/2023) 1 each 0   blood glucose meter kit and supplies Dispense based on patient and insurance preference  Accuchek guide meter and supplies once daily testing dx e11.9 (Patient not taking: Reported on 08/10/2023) 1 each 0   Calcium Carbonate-Vitamin D  600-400 MG-UNIT tablet Take 1 tablet by mouth in the morning.     carvedilol (COREG) 6.25 MG tablet TAKE 1 TABLET TWICE DAILY 180 tablet 3   cholecalciferol (VITAMIN D3) 25 MCG (1000 UNIT) tablet Take 1,000 Units by mouth daily. (Patient not taking: Reported on 06/23/2023)     ferrous sulfate 325 (65 FE) MG EC tablet Take 1 tablet (325 mg total) by mouth daily with breakfast.     furosemide (LASIX) 20 MG tablet Take Two times a week on Tuesday and Thursday 10 tablet 11   glipiZIDE (GLUCOTROL XL) 2.5 MG 24 hr tablet TAKE 1 TABLET WITH BREAKFAST EVERY TUESDAY, THURSDAY AND SATURDAY 36 tablet 3   losartan (COZAAR) 100 MG tablet Take 1 tablet (100 mg total) by mouth daily. 90 tablet 3   omeprazole (PRILOSEC) 40 MG capsule Take 1 capsule (40 mg total) by mouth daily. (Patient taking differently: Take 40 mg by mouth in the morning.) 90 capsule 3   potassium chloride SA (KLOR-CON M) 20 MEQ tablet Take 1 tablet (20 mEq total) by mouth 2 (two) times a week. Take on the days you take Lasix. 30 tablet 5   rosuvastatin (CRESTOR) 40 MG tablet Take 1 tablet (40 mg total) by mouth daily. 90 tablet 1   No current facility-administered medications for this visit.    Allergies as of 10/06/2023 - Review Complete 10/06/2023  Allergen Reaction Noted   Metformin and related Other (See Comments) 01/25/2019    Family History  Problem Relation Age of Onset   Pneumonia Brother        on continuous O2   Stroke Brother    Stroke Sister    COPD Sister    Diabetes Sister    Stroke Sister    Hypertension Mother    Diabetes Mother    Fibroids Daughter        uterine    Social History   Socioeconomic History   Marital status: Married    Spouse name: Jake Shark   Number of children: 3   Years of education: Not on file   Highest education level: High school graduate  Occupational History   Occupation: Retired  Tobacco Use   Smoking status: Never    Passive exposure: Never   Smokeless tobacco: Never  Vaping  Use   Vaping status: Never Used  Substance and Sexual Activity   Alcohol use: No   Drug use: No   Sexual activity: Yes    Birth control/protection: Surgical  Other Topics Concern   Not on file  Social History Narrative   Not on file   Social Drivers of Health   Financial Resource Strain: Low Risk  (04/01/2023)   Overall Financial Resource Strain (CARDIA)    Difficulty of Paying Living Expenses: Not very hard  Food Insecurity: No Food Insecurity (04/01/2023)   Hunger Vital Sign    Worried About Running Out of Food in the Last Year: Never true    Ran Out of Food in the Last Year: Never true  Transportation Needs: No Transportation Needs (04/01/2023)  PRAPARE - Administrator, Civil Service (Medical): No    Lack of Transportation (Non-Medical): No  Physical Activity: Insufficiently Active (05/31/2022)   Exercise Vital Sign    Days of Exercise per Week: 3 days    Minutes of Exercise per Session: 30 min  Stress: No Stress Concern Present (05/31/2022)   [employer] of Occupational Health - Occupational Stress Questionnaire    Feeling of Stress : Not at all  Social Connections: Moderately Integrated (05/31/2022)   Social Connection and Isolation Panel [NHANES]    Frequency of Communication with Friends and Family: More than three times a week    Frequency of Social Gatherings with Friends and Family: Once a week    Attends Religious Services: More than 4 times per year    Active Member of [employer] or Organizations: No    Attends Engineer, structural: Never    Marital Status: Married    Review of systems General: negative for malaise, night sweats, fever, chills, weight loss Neck: Negative for lumps, goiter, pain and significant neck swelling Resp: Negative for cough, wheezing, dyspnea at rest CV: Negative for chest pain, leg swelling, palpitations, orthopnea GI: denies melena, hematochezia, nausea, vomiting, diarrhea, constipation, dysphagia, odyonophagia,  early satiety or unintentional weight loss.  The remainder of the review of systems is noncontributory.  Physical Exam: There were no vitals taken for this visit. General:   Alert and oriented. No distress noted. Pleasant and cooperative.  Head:  Normocephalic and atraumatic. Eyes:  Conjuctiva clear without scleral icterus. Mouth:  Oral mucosa pink and moist. Good dentition. No lesions. Heart: Normal rate and rhythm, s1 and s2 heart sounds present.  Lungs: Clear lung sounds in all lobes. Respirations equal and unlabored. Abdomen:  +BS, soft, non-tender and non-distended. No rebound or guarding. No HSM or masses noted. Neurologic:  Alert and  oriented x4 Psych:  Alert and cooperative. Normal mood and affect.  Invalid input(s): "6 MONTHS"   ASSESSMENT: ALICIAH SANTOY is a 77 y.o. female presenting today for follow up of early satiety and weight loss  Patient reports resolution of early satiety.  Patient and daughter both report that appetite is great, she has actually gained some weight over the last couple months.  Currently doing well on omeprazole 40 mg once daily, denies any breakthrough GERD symptoms, no nausea no vomiting.  Will try to step down to 20 mg of omeprazole daily to see how she does with this.  If she has recurrence of symptoms or breakthrough GERD symptoms will need to increase back to 40 mg daily.  They should let me know if patient has any new or worsening GI symptoms.  Blood pressure elevated in office today, patient recommended to follow-up with PCP regarding her elevated BP.   PLAN:  Decrease omeprazole to 20mg  daily  2. Follow up with PCP regarding elevated BP   All questions were answered, patient verbalized understanding and is in agreement with plan as outlined above.   Follow Up: 6 months   Zephyr Ridley L. Jeanmarie Hubert, MSN, APRN, AGNP-C Adult-Gerontology Nurse Practitioner [facility]  I have reviewed the note and agree with the APP's  assessment as described in this progress note  Katrinka Blazing, MD Gastroenterology and Hepatology [facility] Gastroenterology

## 2023-10-09 ENCOUNTER — Encounter: Payer: Self-pay | Admitting: Family Medicine

## 2023-10-09 NOTE — Progress Notes (Unsigned)
   Mikayla Jenkins     MRN: [id number]      DOB: July 10, 1946  Chief Complaint  Patient presents with   Follow-up    Follow up    HPI Ms. Peper is here for follow up and re-evaluation of chronic medical conditions, medication management and review of any available recent lab and radiology data.  Preventive health is updated, specifically  Cancer screening and Immunization.   Questions or concerns regarding consultations or procedures which the PT has had in the interim are  addressed.Has gotten  great eport from cardiology, improved EF, and nephrology also reports improved renal function Appetite and energy are improved. Unfortunately lost her spouse in past 2 to 3 months, children very supportive The PT denies any adverse reactions to current medications since the last visit.  T   ROS Denies recent fever or chills. Denies sinus pressure, nasal congestion, ear pain or sore throat. Denies chest congestion, productive cough or wheezing. Denies chest pains, palpitations and leg swelling Denies abdominal pain, nausea, vomiting,diarrhea or constipation.   Denies dysuria, frequency, hesitancy or incontinence. Denies joint pain, swelling and limitation in mobility. Denies headaches, seizures, numbness, or tingling. Denies depression, anxiety or insomnia. Denies skin break down or rash.   PE  BP (!) 158/80   Pulse 67   Ht 5\' 7"  (1.702 m)   Wt 135 lb (61.2 kg)   SpO2 96%   BMI 21.14 kg/m   Patient alert and oriented and in no cardiopulmonary distress.  HEENT: No facial asymmetry, EOMI,     Neck supple .  Chest: Clear to auscultation bilaterally.  CVS: S1, S2 no murmurs, no S3.Regular rate.  ABD: Soft non tender.   Ext: No edema  MS: Adequate ROM spine, shoulders, hips and knees.  Skin: Intact, no ulcerations or rash noted.  Psych: Good eye contact, normal affect. Memory intact not anxious or depressed appearing.  CNS: CN 2-12 intact, power,  normal throughout.no focal  deficits noted.   Assessment & Plan  No problem-specific Assessment & Plan notes found for this encounter.

## 2023-10-26 ENCOUNTER — Encounter (INDEPENDENT_AMBULATORY_CARE_PROVIDER_SITE_OTHER): Payer: Self-pay

## 2023-10-26 DIAGNOSIS — E785 Hyperlipidemia, unspecified: Secondary | ICD-10-CM | POA: Insufficient documentation

## 2023-10-26 NOTE — Assessment & Plan Note (Signed)
 Diabetes associated with hypertension, hyperlipidemia, and CKD  Mikayla Jenkins is reminded of the importance of commitment to daily physical activity for 30 minutes or more, as able and the need to limit carbohydrate intake to 30 to 60 grams per meal to help with blood sugar control.   The need to take medication as prescribed, test blood sugar as directed, and to call between visits if there is a concern that blood sugar is uncontrolled is also discussed. Very well connrolled on current regime , no change in management   Mikayla Jenkins is reminded of the importance of daily foot exam, annual eye examination, and good blood sugar, blood pressure and cholesterol control.     Latest Ref Rng & Units 10/03/2023    3:17 PM 07/06/2023    2:44 PM 06/21/2023    2:07 PM 06/17/2023    4:06 PM 05/04/2023    1:45 PM  Diabetic Labs  HbA1c 4.8 - 5.6 % 6.2   6.1     Chol 100 - 199 mg/dL   814     HDL >60 mg/dL   889     Calc LDL 0 - 99 mg/dL   64     Triglycerides 0 - 149 mg/dL   54     Creatinine 9.42 - 1.00 mg/dL 8.79  8.73  8.71  8.66  1.38       10/06/2023    2:45 PM 10/06/2023    2:13 PM 10/06/2023    2:12 PM 10/06/2023    1:48 PM 10/06/2023    1:28 PM 08/10/2023    3:02 PM 08/10/2023    2:46 PM  BP/Weight  Systolic BP 158 167 181 154 180 152 150  Diastolic BP 80 78 84 73 70 94 899  Wt. (Lbs)   135  135  128.6  BMI   21.14 kg/m2  21.14 kg/m2  20.14 kg/m2      Latest Ref Rng & Units 11/30/2022   12:00 AM 12/29/2020   12:00 AM  Foot/eye exam completion dates  Eye Exam No Retinopathy No Retinopathy     No Retinopathy         This result is from an external source.

## 2023-10-26 NOTE — Assessment & Plan Note (Signed)
 Mild improvement in numbers recently whic is great, managed by Nephrology

## 2023-10-26 NOTE — Assessment & Plan Note (Signed)
 Improved though still sub optimal control DASH diet and commitment to daily physical activity for a minimum of 30 minutes discussed and encouraged, as a part of hypertension management. The importance of attaining a healthy weight is also discussed.     10/06/2023    2:45 PM 10/06/2023    2:13 PM 10/06/2023    2:12 PM 10/06/2023    1:48 PM 10/06/2023    1:28 PM 08/10/2023    3:02 PM 08/10/2023    2:46 PM  BP/Weight  Systolic BP 158 167 181 154 180 152 150  Diastolic BP 80 78 84 73 70 94 899  Wt. (Lbs)   135  135  128.6  BMI   21.14 kg/m2  21.14 kg/m2  20.14 kg/m2     Managed by Cardiology

## 2023-10-26 NOTE — Assessment & Plan Note (Signed)
 Hyperlipidemia:Low fat diet discussed and encouraged.   Lipid Panel  Lab Results  Component Value Date   CHOL 185 06/21/2023   HDL 110 06/21/2023   LDLCALC 64 06/21/2023   TRIG 54 06/21/2023   CHOLHDL 1.7 06/21/2023     Controlled, no change in medication Updated lab needed at/ before next visit.

## 2023-10-27 ENCOUNTER — Inpatient Hospital Stay (HOSPITAL_COMMUNITY): Admission: RE | Admit: 2023-10-27 | Payer: Medicare HMO | Source: Ambulatory Visit

## 2023-11-03 ENCOUNTER — Encounter (HOSPITAL_COMMUNITY): Payer: Self-pay

## 2023-11-03 ENCOUNTER — Ambulatory Visit (HOSPITAL_COMMUNITY)
Admission: RE | Admit: 2023-11-03 | Discharge: 2023-11-03 | Disposition: A | Discharge status: Home / Self Care | Payer: Medicare HMO | Source: Ambulatory Visit | Attending: Family Medicine | Admitting: Family Medicine

## 2023-11-03 DIAGNOSIS — Z1231 Encounter for screening mammogram for malignant neoplasm of breast: Secondary | ICD-10-CM | POA: Diagnosis not present

## 2023-11-08 ENCOUNTER — Ambulatory Visit: Payer: Medicare HMO | Admitting: Adult Health

## 2023-11-10 ENCOUNTER — Encounter: Payer: Self-pay | Admitting: Student

## 2023-11-10 ENCOUNTER — Ambulatory Visit: Payer: Medicare HMO | Attending: Student | Admitting: Student

## 2023-11-10 ENCOUNTER — Other Ambulatory Visit: Payer: Self-pay | Admitting: Internal Medicine

## 2023-11-10 VITALS — BP 130/78 | HR 64 | Ht 67.0 in | Wt 134.8 lb

## 2023-11-10 DIAGNOSIS — E785 Hyperlipidemia, unspecified: Secondary | ICD-10-CM | POA: Diagnosis not present

## 2023-11-10 DIAGNOSIS — I10 Essential (primary) hypertension: Secondary | ICD-10-CM

## 2023-11-10 DIAGNOSIS — N1831 Chronic kidney disease, stage 3a: Secondary | ICD-10-CM

## 2023-11-10 DIAGNOSIS — I5020 Unspecified systolic (congestive) heart failure: Secondary | ICD-10-CM

## 2023-11-10 DIAGNOSIS — I513 Intracardiac thrombosis, not elsewhere classified: Secondary | ICD-10-CM

## 2023-11-10 DIAGNOSIS — I5032 Chronic diastolic (congestive) heart failure: Secondary | ICD-10-CM | POA: Diagnosis not present

## 2023-11-10 DIAGNOSIS — I214 Non-ST elevation (NSTEMI) myocardial infarction: Secondary | ICD-10-CM | POA: Diagnosis not present

## 2023-11-10 NOTE — Progress Notes (Signed)
Cardiology Office Note    Date:  11/10/2023  ID:  Mikayla, Jenkins 12-30-1945, MRN [id number] Cardiologist: Marjo Bicker, MD    History of Present Illness:    Mikayla Jenkins is a 78 y.o. female with past medical history of HTN, HLD, Type 2 DM, Stage 3 CKD, OSA (on CPAP) and HFrEF/Takotsubo Cardiomyopathy with LV thrombus (diagnosed in 03/2023 with EF 25-30% by echo and 41% by cMRI) who presents to the office today for 30-month follow-up.  She was examined by myself in 07/2023 and was overall feeling well at that time and denied any recent anginal symptoms. Blood pressure was elevated at the time of her office visit and elevated at home, therefore Losartan was titrated from 75 mg daily to 100 mg daily while being continued on Coreg 6.25 mg twice daily. Her family had previously wished for her to avoid Entresto and she had been intolerant to Spironolactone and SGLT2 inhibitors as she had developed AKI's with both in the past. It was recommend to obtain a follow-up limited echocardiogram for reassessment of her EF and if this remained reduced, could add Hydralazine. Limited echocardiogram in 09/2023 showed her EF had improved to 50 to 55% and there were no wall motion abnormalities. Right ventricular function was normal as well.  In talking with the patient and her daughter today, she does report that her husband passed away from cancer in 10/05/23. Her children have been checking on her regularly and alternating staying with her as she does live by herself. She denies any specific chest pain or dyspnea on exertion. No recent palpitations, orthopnea or PND. She does experience occasional ankle edema but no pitting. Her blood pressure was elevated when checked by her PCP in 09/2023 but this is well-controlled at 130/78 during today's visit. They also checked BP at home earlier today and it was well-controlled as well.  Studies Reviewed:   EKG: EKG is not ordered today.  Cardiac Catheterization:  02/2023 Angiographically normal coronary artery disease with no obvious culprit lesion. Codominant system. Difficult to image left ventriculography, would recommend 2D echo but appears to be normal EF. Normal EDP.      Plan: Will need to consider different etiology for troponin elevation however for now we will go ahead and treat as though this could have been ACS with a lesion that cleared with nitroglycerin, aspirin and heparin.. For now we will treat with DAPT and 40 hours of IV heparin until other etiology is determined. Check 2D echo to better assess EF and consider cardiac MRI as there is concern for troponin elevation and her discomfort being related to myocarditis. Will continue most home medications but hold the thiazide diuretic and clonidine nightly. Sliding-scale insulin. Standard labs checked with A1c, lipid panel etc.  Limited Echocardiogram: 09/2023 IMPRESSIONS     1. Limited Echo for LV function.   2. Left ventricular ejection fraction, by estimation, is 50 to 55%. Left  ventricular ejection fraction by 2D MOD biplane is 52.9 %. The left  ventricle has low normal function. The left ventricle has no regional wall  motion abnormalities. Left  ventricular diastolic function could not be evaluated.   3. Right ventricular systolic function is normal. The right ventricular  size is normal. Tricuspid regurgitation signal is inadequate for assessing  PA pressure.   Comparison(s): Changes from prior study are noted. LVEF improved from 40%  to 50-55% now.   Physical Exam:   VS:  BP 130/78 (BP Location: Right Arm,  Patient Position: Sitting, Cuff Size: Normal)   Pulse 64   Ht 5\' 7"  (1.702 m)   Wt 134 lb 12.8 oz (61.1 kg)   SpO2 100%   BMI 21.11 kg/m    Wt Readings from Last 3 Encounters:  11/10/23 134 lb 12.8 oz (61.1 kg)  10/06/23 135 lb (61.2 kg)  10/06/23 135 lb (61.2 kg)     GEN: Well nourished, well developed female appearing in no acute distress NECK: No JVD;  No carotid bruits CARDIAC: RRR, no murmurs, rubs, gallops RESPIRATORY:  Clear to auscultation without rales, wheezing or rhonchi  ABDOMEN: Appears non-distended. No obvious abdominal masses. EXTREMITIES: No clubbing or cyanosis. No pitting edema.  Distal pedal pulses are 2+ bilaterally.   Assessment and Plan:   1. Chronic HFimpEF/History of Takotsubo Cardiomyopathy - Her EF was previously reduced at 25 to 30% in 03/2023 and had significantly improved to 55% by most recent imaging in 09/2023. She appears euvolemic by examination today and her weight has been stable on her home scales.  - Will continue current GDMT with Coreg 6.25 mg twice daily, Losartan 100 mg daily and Lasix 20 mg twice a week (family has preferred this as compared to PRN dosing as they use an automatic pill dispenser). She previously had AKI's with Spironolactone and SGLT2 inhibitors in the past.   2. History of NSTEMI - Occurred in 02/2023 and cardiac catheterization at that time showed normal coronary arteries. She denies any recent anginal symptoms. Continue Coreg 6.25 mg twice daily and Crestor 40 mg daily. She is no longer on ASA.  3. HLD - LDL was at 64 in 06/2023. Continue Crestor 40 mg daily.  4. HTN - Blood pressure is well-controlled at 130/78 during today's visit. Continue current medical therapy with Coreg 6.25 mg twice daily and Losartan 100 mg daily.  5. History of LV Thrombus - Resolved by repeat echocardiogram and no longer on anticoagulation.  6. Stage 3 CKD - Her creatinine was stable at 1.20 by most recent labs in 09/2023 which is close to her known baseline.  Signed, Ellsworth Lennox, PA-C

## 2023-11-10 NOTE — Patient Instructions (Signed)
Medication Instructions:  Your physician recommends that you continue on your current medications as directed. Please refer to the Current Medication list given to you today.  *If you need a refill on your cardiac medications before your next appointment, please call your pharmacy*   Lab Work: NONE   If you have labs (blood work) drawn today and your tests are completely normal, you will receive your results only by: MyChart Message (if you have MyChart) OR A paper copy in the mail If you have any lab test that is abnormal or we need to change your treatment, we will call you to review the results.   Testing/Procedures: NONE    Follow-Up: At [facility], you and your health needs are our priority.  As part of our continuing mission to provide you with exceptional heart care, we have created designated Provider Care Teams.  These Care Teams include your primary Cardiologist (physician) and Advanced Practice Providers (APPs -  Physician Assistants and Nurse Practitioners) who all work together to provide you with the care you need, when you need it.  We recommend signing up for the patient portal called "MyChart".  Sign up information is provided on this After Visit Summary.  MyChart is used to connect with patients for Virtual Visits (Telemedicine).  Patients are able to view lab/test results, encounter notes, upcoming appointments, etc.  Non-urgent messages can be sent to your provider as well.   To learn more about what you can do with MyChart, go to [email or web address].    Your next appointment:   6 month(s)  Provider:   You may see Vishnu P Mallipeddi, MD or one of the following Advanced Practice Providers on your designated Care Team:   Randall An, PA-C  Jacolyn Reedy, PA-C     Other Instructions Thank you for choosing [facility]!

## 2023-11-16 ENCOUNTER — Ambulatory Visit: Payer: Self-pay | Admitting: *Deleted

## 2023-11-16 NOTE — Patient Instructions (Addendum)
Visit Information  Thank you for taking time to visit with me today. Please don't hesitate to contact me if I can be of assistance to you.   Information discussed:  [email or web address] Aging, Disability & Transit [facility] [address] [place], [place] [address] [address] [place], [place] [address] Phone: [phone] Transportation: [phone] Fax: [phone] Email: [email or web address]  [email or web address] Web Page: www.adtsrc.org Facebook: Investment banker, [email or web address]  Customer service manager Phone: [phone]  Email: [email or web address] Plowman Powers,  Executive Director Esmeralda Links,  Intake & Benefit Coordinator  Family Caregiver Support Program, Adult Day Care, Congregate Nutrition, Home Delivered Meals, In-Home Aide II, III, In-Home Respite, General & medical Transportation, Operation Fan Heat Relief [facility] [address] [place], [place] [address] Phone: [phone] Email: [email or web address] Landsman,  Director  [facility]) [address] [place], [place] [address]  5307679185 [employer] Family Caregiver Support Program Eligible family caregivers are: younger Mayra Neer hugging older woman A caregiver of any age providing care for an older adult age 55 or older, OR providing care for a person with Alzheimer's Disease or related brain disorder. A caregiver (who is not the birth or adoptive parent), age 30 or older, raising a related child age 75 and under or an adult with a disability. The FCSP offers a range of services to support family caregivers Contact person- Yong Channel Departments: Area Agency on Aging Title: Family Media planner Phone: [phone] ext. [phone] Fax: [phone]  Following are the goals we discussed today:   Goals Addressed             This Visit's Progress    Managing Diabetes, Cardiac  illnesses, medicines, nutrition-care coordinator services   On track     Patient will continue to have improvements of major medical illnesses and community resources as needed Interventions Today    Flowsheet Row Most Recent Value  Chronic Disease   Chronic disease during today's visit --  [3 month follow up, community resources+, future outreaches]  General Interventions   General Interventions Discussed/Reviewed General Interventions Reviewed, [employer], Doctor Visits, Level of Care  Doctor Visits Discussed/Reviewed Doctor Visits Reviewed, PCP, Specialist  [seeing cardiology for hypertension]  PCP/Specialist Visits Compliance with follow-up visit  Level of Care Applications, Personal Care Services, Adult Daycare  [reviewed community resources in [place]]  Applications Medicaid, Personal Care Services, Other  Exercise Interventions   Exercise Discussed/Reviewed Exercise Reviewed, Physical Activity  [keeping active and appetite improvement confirmed]  Physical Activity Discussed/Reviewed Physical Activity Reviewed  Education Interventions   Education Provided Provided Education  [employer] resources, Tree surgeon support program, ADTS, voucher, PCS, VBC care guise/SW]  Provided Verbal Education On [employer], Air traffic controller, Medication  Applications Medicaid, Personal Care Services, Other  Mental Health Interventions   Mental Health Discussed/Reviewed Mental Health Reviewed, Coping Strategies, Other  Nutrition Interventions   Nutrition Discussed/Reviewed Nutrition Reviewed  [confirmed improved appetite]  Pharmacy Interventions   Pharmacy Dicussed/Reviewed Pharmacy Topics Reviewed, Medications and their functions             Our next appointment is by telephone on 05/15/24 at 1 pm  Please call the care guide team at [phone] if you need to cancel or reschedule your appointment.   If you are experiencing a Mental Health or Behavioral Health  Crisis or need someone to talk to, please call the Suicide and Crisis Lifeline: 988 call the [place] National Suicide Prevention Lifeline: 626-619-7019 or TTY: (845) 689-0140 TTY 845-304-4164)  to talk to a trained counselor call 1-800-273-TALK (toll free, 24 hour hotline) call the [place] [facility]: 937-373-1274 call 911   Patient verbalizes understanding of instructions and care plan provided today and agrees to view in MyChart. Active MyChart status and patient understanding of how to access instructions and care plan via MyChart confirmed with patient.     The patient has been provided with contact information for the care management team and has been advised to call with any health related questions or concerns.   Laritza Vokes L. Noelle Penner, RN, BSN, CCM [place]  [facility], [facility] Health RN Care Manager Direct Dial: [phone]  Fax: [phone] Mailing Address: [address]  [place] [address] Website: [email or web address]

## 2023-11-16 NOTE — Patient Outreach (Addendum)
Care Coordination   Follow Up Visit Note   11/16/2023 Name: Mikayla Jenkins MRN: [id number] DOB: 1946/05/23  Mikayla Jenkins is a 78 y.o. year old female who sees Kerri Perches, MD for primary care. I spoke with  daughter, Mikayla Jenkins, MAYUMI SUMMERSON by phone today.  What matters to the patients health and wellness today?  Community resources  to lower costs of home care and needs around the home  Community services-  no medicaid application completed yet  Daughter & son working on this.  Patient has assets Related to qualifying for some community services Has assist from a female for some home care hours but some home care hours need further daily coverage   Daughter has called several agencies and the rates were too expensive for some home/personal care services  Plus she has outreached to [employer] on aging - pending response- Will be encouraged to continue outreach frequently   Patient has decreased triggers, no further increased agitation. Sposue passed. Children aware of triggers, remain very supportive ans active in care  Eating better, socializing more, keeping active  Medical check ups are better with pcp, nephrologist, cardiology   Continue with some elevations in blood pressure but manageable Agree to further outreach   Goals Addressed             This Visit's Progress    Managing Diabetes, Cardiac illnesses, medicines, nutrition-care coordinator services   On track     Patient will continue to have improvements of major medical illnesses and community resources as needed Interventions Today    Flowsheet Row Most Recent Value  Chronic Disease   Chronic disease during today's visit --  [3 month follow up, community resources+, future outreaches]  General Interventions   General Interventions Discussed/Reviewed General Interventions Reviewed, [employer], Doctor Visits, Level of Care  Doctor Visits Discussed/Reviewed Doctor Visits Reviewed, PCP, Specialist   [seeing cardiology for hypertension]  PCP/Specialist Visits Compliance with follow-up visit  Level of Care Applications, Personal Care Services, Adult Daycare  [reviewed community resources in [place] county]  Applications Medicaid, Personal Care Services, Other  Exercise Interventions   Exercise Discussed/Reviewed Exercise Reviewed, Physical Activity  [keeping active and appetite improvement confirmed]  Physical Activity Discussed/Reviewed Physical Activity Reviewed  Education Interventions   Education Provided Provided Education  [employer] resources, Tree surgeon support program, ADTS, voucher, PCS, VBC care guise/SW]  Provided Verbal Education On [employer], Air traffic controller, Medication  Applications Medicaid, Personal Care Services, Other  Mental Health Interventions   Mental Health Discussed/Reviewed Mental Health Reviewed, Coping Strategies, Other  Nutrition Interventions   Nutrition Discussed/Reviewed Nutrition Reviewed  [confirmed improved appetite]  Pharmacy Interventions   Pharmacy Dicussed/Reviewed Pharmacy Topics Reviewed, Medications and their functions             SDOH assessments and interventions completed:  Yes  SDOH Interventions Today    Flowsheet Row Most Recent Value  SDOH Interventions   Food Insecurity Interventions Intervention Not Indicated  Housing Interventions Intervention Not Indicated  Transportation Interventions Intervention Not Indicated  Utilities Interventions Intervention Not Indicated  Financial Strain Interventions Intervention Not Indicated  Stress Interventions Intervention Not Indicated  Social Connections Interventions Intervention Not Indicated  Health Literacy Interventions Intervention Not Indicated        Care Coordination Interventions:  Yes, provided   Follow up plan: Follow up call scheduled for 05/15/24    Encounter Outcome:  Patient Visit Completed   Cala Bradford L. Noelle Penner, RN, BSN, CCM [place]  [facility], [facility] Health RN Care Manager Direct Dial: [phone]  Fax: [phone] Mailing Address: [address]  [place] [address] Website: [email or web address]

## 2023-11-16 NOTE — Patient Instructions (Signed)
Visit Information  Thank you for taking time to visit with me today. Please don't hesitate to contact me if I can be of assistance to you.   Following are the goals we discussed today:   Goals Addressed             This Visit's Progress    Managing Diabetes, Cardiac illnesses, medicines, nutrition-care coordinator services   On track     Interventions for 08/16/23 Interventions Today    Flowsheet Row Most Recent Value  Chronic Disease   Chronic disease during today's visit Other  [Concern with her Spouse's returned to hospital with problems breathing nausea/vomiting, ileostomy issues- Denies worsening issues of her own that is not manageable at home or assist of daughter]  General Interventions   General Interventions Discussed/Reviewed General Interventions Reviewed, Doctor Visits, Community Resources  Doctor Visits Discussed/Reviewed Doctor Visits Reviewed, PCP, Specialist  [confirmed still has not seen neurologist]  PCP/Specialist Visits Compliance with follow-up visit  Exercise Interventions   Exercise Discussed/Reviewed Exercise Reviewed, Physical Activity  Mental Health Interventions   Mental Health Discussed/Reviewed Mental Health Reviewed, Other  [dementia, care giver concerns]             Our next appointment is by telephone on 11/16/23 at ?  Please call the care guide team at [phone] if you need to cancel or reschedule your appointment.   If you are experiencing a Mental Health or Behavioral Health Crisis or need someone to talk to, please call the Suicide and Crisis Lifeline: 988 call the [place] National Suicide Prevention Lifeline: 434-414-7654 or TTY: 262-391-3682 TTY 717-469-7578) to talk to a trained counselor call 1-800-273-TALK (toll free, 24 hour hotline) call the [facility]: 219-237-3266 call 911   Patient verbalizes understanding of instructions and care plan provided today and agrees to view in MyChart. Active MyChart status  and patient understanding of how to access instructions and care plan via MyChart confirmed with patient.     The patient has been provided with contact information for the care management team and has been advised to call with any health related questions or concerns.   Saidee Geremia L. Noelle Penner, RN, BSN, CCM Emergency planning/management officer Dial: [phone]

## 2023-11-17 DIAGNOSIS — I5032 Chronic diastolic (congestive) heart failure: Secondary | ICD-10-CM | POA: Diagnosis not present

## 2023-11-17 DIAGNOSIS — I129 Hypertensive chronic kidney disease with stage 1 through stage 4 chronic kidney disease, or unspecified chronic kidney disease: Secondary | ICD-10-CM | POA: Diagnosis not present

## 2023-11-17 DIAGNOSIS — E1122 Type 2 diabetes mellitus with diabetic chronic kidney disease: Secondary | ICD-10-CM | POA: Diagnosis not present

## 2023-11-17 DIAGNOSIS — N1832 Chronic kidney disease, stage 3b: Secondary | ICD-10-CM | POA: Diagnosis not present

## 2023-11-23 ENCOUNTER — Other Ambulatory Visit (INDEPENDENT_AMBULATORY_CARE_PROVIDER_SITE_OTHER): Payer: Self-pay | Admitting: Gastroenterology

## 2023-11-23 NOTE — Telephone Encounter (Signed)
 I spoke with the patient daughter Shelvy Dickens, She says this was decreased to 20 mg at last visit with [facility], but that was not helpful and says they sent a My Chart from [facility] saying to go back up to 40 mg daily.

## 2023-11-25 DIAGNOSIS — I129 Hypertensive chronic kidney disease with stage 1 through stage 4 chronic kidney disease, or unspecified chronic kidney disease: Secondary | ICD-10-CM | POA: Diagnosis not present

## 2023-11-25 DIAGNOSIS — E1122 Type 2 diabetes mellitus with diabetic chronic kidney disease: Secondary | ICD-10-CM | POA: Diagnosis not present

## 2023-11-25 DIAGNOSIS — E876 Hypokalemia: Secondary | ICD-10-CM | POA: Diagnosis not present

## 2023-11-25 DIAGNOSIS — N1831 Chronic kidney disease, stage 3a: Secondary | ICD-10-CM | POA: Diagnosis not present

## 2023-12-02 ENCOUNTER — Other Ambulatory Visit: Payer: Self-pay

## 2023-12-02 MED ORDER — LOSARTAN POTASSIUM 100 MG PO TABS: 100.0000 mg | ORAL_TABLET | Freq: Every day | ORAL | 3 refills | Status: DC

## 2023-12-02 MED ORDER — POTASSIUM CHLORIDE CRYS ER 20 MEQ PO TBCR
20.0000 meq | EXTENDED_RELEASE_TABLET | ORAL | 5 refills | Status: AC
Start: 2023-12-05 — End: ?

## 2023-12-02 NOTE — Telephone Encounter (Signed)
This is a [place] pt.

## 2023-12-16 ENCOUNTER — Other Ambulatory Visit: Payer: Self-pay | Admitting: Family Medicine

## 2024-01-02 ENCOUNTER — Ambulatory Visit: Payer: Medicare HMO | Admitting: Adult Health

## 2024-01-11 ENCOUNTER — Ambulatory Visit: Payer: Self-pay | Admitting: *Deleted

## 2024-01-11 NOTE — Patient Instructions (Addendum)
 Visit Information  Thank you for taking time to visit with me today. Please don't hesitate to contact me if I can be of assistance to you.   Danise Edge [phone]   Following are the goals we discussed today:   Goals Addressed   None     Our next appointment is by telephone on as needed at as needed   Please call the care guide team at [phone] if you need to cancel or reschedule your appointment.   If you are experiencing a Mental Health or Behavioral Health Crisis or need someone to talk to, please call the Suicide and Crisis Lifeline: 988 call the [place] National Suicide Prevention Lifeline: 380-343-3790 or TTY: 507-637-7898 TTY (603)761-3535) to talk to a trained counselor call 1-800-273-TALK (toll free, 24 hour hotline) call the [facility]: ([phone] call 911   Patient verbalizes understanding of instructions and care plan provided today and agrees to view in MyChart. Active MyChart status and patient understanding of how to access instructions and care plan via MyChart confirmed with patient.     The patient has been provided with contact information for the care management team and has been advised to call with any health related questions or concerns.   Arbor Cohen L. Noelle Penner, RN, BSN, CCM [place]  [facility], [facility] Health RN Care Manager Direct Dial: [phone]  Fax: [phone]

## 2024-01-11 NOTE — Patient Outreach (Signed)
 Care Coordination   Follow Up Visit Note   01/11/2024 Name: Mikayla Jenkins MRN: [id number] DOB: [birth year]  Mikayla Jenkins is a 78 y.o. year old female who sees Kerri Perches, MD for primary care. I spoke with  [name] by phone today.  What matters to the patients health and wellness today?  Denies any major worsening symptom except a skin change after using [employer] Had to change to [employer] body wash and petroleum jelly Daughter will take patient to a local dermatologist      Goals Addressed             This Visit's Progress    COMPLETED: Managing Diabetes, Cardiac illnesses, medicines, nutrition-care coordinator services        Patient will continue to have improvements of major medical illnesses and community resources as needed goals met Interventions Today    Flowsheet Row Most Recent Value  Chronic Disease   Chronic disease during today's visit --  [during well Skin irriation from [employer], was healing but irritation returned. Dermatologists reviewed]  General Interventions   General Interventions Discussed/Reviewed General Interventions Reviewed, Doctor Visits, Community Resources  Doctor Visits Discussed/Reviewed Doctor Visits Reviewed, PCP, Specialist  PCP/Specialist Visits Compliance with follow-up visit  Education Interventions   Education Provided Provided Education  [skin care]  Provided Verbal Education On Other, [employer] Resources  Mental Health Interventions   Mental Health Discussed/Reviewed Mental Health Reviewed, Coping Strategies  Pharmacy Interventions   Pharmacy Dicussed/Reviewed Pharmacy Topics Reviewed, Affording Medications  Safety Interventions   Safety Discussed/Reviewed Safety Reviewed, Home Safety  Home Safety Assistive Devices             SDOH assessments and interventions completed:  No     Care Coordination Interventions:  Yes, provided   Follow up plan: No further intervention required.    Encounter Outcome:   Patient Visit Completed   Cala Bradford L. Noelle Penner, RN, BSN, CCM [place]  [facility], [facility] Health RN Care Manager Direct Dial: [phone]  Fax: [phone]

## 2024-02-07 ENCOUNTER — Ambulatory Visit: Payer: Medicare HMO | Admitting: Family Medicine

## 2024-02-23 ENCOUNTER — Encounter (HOSPITAL_COMMUNITY): Payer: Self-pay

## 2024-02-23 DIAGNOSIS — E785 Hyperlipidemia, unspecified: Secondary | ICD-10-CM | POA: Diagnosis not present

## 2024-02-23 DIAGNOSIS — I10 Essential (primary) hypertension: Secondary | ICD-10-CM | POA: Diagnosis not present

## 2024-02-23 DIAGNOSIS — E1121 Type 2 diabetes mellitus with diabetic nephropathy: Secondary | ICD-10-CM | POA: Diagnosis not present

## 2024-02-24 ENCOUNTER — Encounter: Payer: Self-pay | Admitting: Family Medicine

## 2024-02-24 LAB — CMP14+EGFR
ALT: 24 IU/L (ref 0–32)
AST: 22 IU/L (ref 0–40)
Albumin: 4.5 g/dL (ref 3.8–4.8)
Alkaline Phosphatase: 122 IU/L — ABNORMAL HIGH (ref 44–121)
BUN/Creatinine Ratio: 15 (ref 12–28)
BUN: 17 mg/dL (ref 8–27)
Bilirubin Total: 0.8 mg/dL (ref 0.0–1.2)
CO2: 22 mmol/L (ref 20–29)
Calcium: 9.9 mg/dL (ref 8.7–10.3)
Chloride: 102 mmol/L (ref 96–106)
Creatinine, Ser: 1.14 mg/dL — ABNORMAL HIGH (ref 0.57–1.00)
Globulin, Total: 2.4 g/dL (ref 1.5–4.5)
Glucose: 127 mg/dL — ABNORMAL HIGH (ref 70–99)
Potassium: 4.3 mmol/L (ref 3.5–5.2)
Sodium: 142 mmol/L (ref 134–144)
Total Protein: 6.9 g/dL (ref 6.0–8.5)
eGFR: 49 mL/min/{1.73_m2} — ABNORMAL LOW (ref >59)

## 2024-02-24 LAB — LIPID PANEL
Chol/HDL Ratio: 1.8 ratio (ref 0.0–4.4)
Cholesterol, Total: 185 mg/dL (ref 100–199)
HDL: 102 mg/dL (ref >39)
LDL Chol Calc (NIH): 67 mg/dL (ref 0–99)
Triglycerides: 89 mg/dL (ref 0–149)
VLDL Cholesterol Cal: 16 mg/dL (ref 5–40)

## 2024-02-24 LAB — HEMOGLOBIN A1C
Est. average glucose Bld gHb Est-mCnc: 143 mg/dL
Hgb A1c MFr Bld: 6.6 % — ABNORMAL HIGH (ref 4.8–5.6)

## 2024-02-24 LAB — TSH: TSH: 1.18 u[IU]/mL (ref 0.450–4.500)

## 2024-03-01 ENCOUNTER — Ambulatory Visit: Admitting: Nurse Practitioner

## 2024-03-01 ENCOUNTER — Ambulatory Visit (INDEPENDENT_AMBULATORY_CARE_PROVIDER_SITE_OTHER): Admitting: Family Medicine

## 2024-03-01 ENCOUNTER — Encounter: Payer: Self-pay | Admitting: Family Medicine

## 2024-03-01 VITALS — BP 148/82 | HR 73 | Resp 16 | Ht 65.0 in | Wt 136.1 lb

## 2024-03-01 DIAGNOSIS — I1A0 Resistant hypertension: Secondary | ICD-10-CM | POA: Diagnosis not present

## 2024-03-01 DIAGNOSIS — E785 Hyperlipidemia, unspecified: Secondary | ICD-10-CM

## 2024-03-01 DIAGNOSIS — E559 Vitamin D deficiency, unspecified: Secondary | ICD-10-CM

## 2024-03-01 DIAGNOSIS — R109 Unspecified abdominal pain: Secondary | ICD-10-CM | POA: Diagnosis not present

## 2024-03-01 DIAGNOSIS — M25552 Pain in left hip: Secondary | ICD-10-CM

## 2024-03-01 DIAGNOSIS — R079 Chest pain, unspecified: Secondary | ICD-10-CM

## 2024-03-01 DIAGNOSIS — R10A0 Flank pain, unspecified side: Secondary | ICD-10-CM

## 2024-03-01 DIAGNOSIS — E1121 Type 2 diabetes mellitus with diabetic nephropathy: Secondary | ICD-10-CM | POA: Diagnosis not present

## 2024-03-01 DIAGNOSIS — N189 Chronic kidney disease, unspecified: Secondary | ICD-10-CM | POA: Diagnosis not present

## 2024-03-01 DIAGNOSIS — R748 Abnormal levels of other serum enzymes: Secondary | ICD-10-CM | POA: Diagnosis not present

## 2024-03-01 DIAGNOSIS — D631 Anemia in chronic kidney disease: Secondary | ICD-10-CM

## 2024-03-01 NOTE — Patient Instructions (Addendum)
 Annual exam in 4 months, call if you need me sooner   Labs in next 1 week non fasting iron, panel and vit D  Hepatic panel with labs when getting for Nephrology non fasting 6/5   X ray of chest , left hip and  US  of kidneys due to 3 day h/o localized pain intermittently   Keep up great work no med changes  Thanks for choosing [facility], we consider it a privelige to serve you.

## 2024-03-02 DIAGNOSIS — R109 Unspecified abdominal pain: Secondary | ICD-10-CM | POA: Insufficient documentation

## 2024-03-02 DIAGNOSIS — M25552 Pain in left hip: Secondary | ICD-10-CM | POA: Insufficient documentation

## 2024-03-02 NOTE — Assessment & Plan Note (Addendum)
 Us  kidney, resistant HTN and CKD, with new flank pain

## 2024-03-02 NOTE — Assessment & Plan Note (Signed)
 Controlled, no change in medication

## 2024-03-02 NOTE — Assessment & Plan Note (Signed)
X-ray hip

## 2024-03-05 ENCOUNTER — Other Ambulatory Visit: Payer: Self-pay

## 2024-03-05 ENCOUNTER — Encounter: Payer: Self-pay | Admitting: Family Medicine

## 2024-03-05 DIAGNOSIS — E559 Vitamin D deficiency, unspecified: Secondary | ICD-10-CM | POA: Insufficient documentation

## 2024-03-05 DIAGNOSIS — D631 Anemia in chronic kidney disease: Secondary | ICD-10-CM | POA: Diagnosis not present

## 2024-03-05 DIAGNOSIS — N189 Chronic kidney disease, unspecified: Secondary | ICD-10-CM | POA: Diagnosis not present

## 2024-03-05 DIAGNOSIS — I1A0 Resistant hypertension: Secondary | ICD-10-CM | POA: Insufficient documentation

## 2024-03-05 DIAGNOSIS — R079 Chest pain, unspecified: Secondary | ICD-10-CM | POA: Insufficient documentation

## 2024-03-05 DIAGNOSIS — E1121 Type 2 diabetes mellitus with diabetic nephropathy: Secondary | ICD-10-CM

## 2024-03-05 DIAGNOSIS — R748 Abnormal levels of other serum enzymes: Secondary | ICD-10-CM | POA: Insufficient documentation

## 2024-03-05 NOTE — Assessment & Plan Note (Signed)
 Update iron stores and level pt's daughter request

## 2024-03-05 NOTE — Assessment & Plan Note (Signed)
 Acute onset, CXR to further eval, location is posterior and localized to lower ribs 10 and 11

## 2024-03-05 NOTE — Assessment & Plan Note (Signed)
 Hyperlipidemia:Low fat diet discussed and encouraged.   Lipid Panel  Lab Results  Component Value Date   CHOL 185 02/23/2024   HDL 102 02/23/2024   LDLCALC 67 02/23/2024   TRIG 89 02/23/2024   CHOLHDL 1.8 02/23/2024     Controlled, no change in medication

## 2024-03-05 NOTE — Progress Notes (Signed)
 Mikayla Jenkins     MRN: [id number]      DOB: 1946-05-09  Chief Complaint  Patient presents with   Medical Management of Chronic Issues    4 month follow up     HPI Mikayla Jenkins is here for follow up and re-evaluation of chronic medical conditions, medication management and review of any available recent lab and radiology data.  Preventive health is updated, specifically  Cancer screening and Immunization.   Questions or concerns regarding consultations or procedures which the PT has had in the interim are  addressed. The PT denies any adverse reactions to current medications since the last visit.  2 day h/o left lower posterior chest wall pain and left hip pain, no known trauma , inermittent, no specific aggravating or relieving factors noted , gets up to a 5 , daughter believes problem is with the hip as one leg is longer than the other and she has had right hip surgery Denies polyuria, polydipsia, blurred vision , or hypoglycemic episodes. Doing very well otherwise with great reports, labs and blood pressure readings, apprtie also good ROS Denies recent fever or chills. Denies sinus pressure, nasal congestion, ear pain or sore throat. Denies chest congestion, productive cough or wheezing. Denies chest pains, palpitations and leg swelling Denies abdominal pain, nausea, vomiting,diarrhea or constipation.   Denies dysuria, frequency, hesitancy or incontinence. . Denies depression, anxiety or insomnia. Denies skin break down or rash.   PE  BP (!) 148/82   Pulse 73   Resp 16   Ht 5\' 5"  (1.651 m)   Wt 136 lb 1.3 oz (61.7 kg)   SpO2 94%   BMI 22.64 kg/m    Patient alert and oriented and in no cardiopulmonary distress.  HEENT: No facial asymmetry, EOMI,     Neck supple .  Chest: Clear to auscultation bilaterally.Tender to palpation over posterior left ;lower rib cage  CVS: S1, S2 no murmurs, no S3.Regular rate.  ABD: Soft non tender.   Ext: No edema  MS: Adequate though  decreased ROM spine,and  hips  Skin: Intact, no ulcerations or rash noted.  Psych: Good eye contact, normal affect. not anxious or depressed appearing.  CNS: CN 2-12 intact, power,  normal throughout.no focal deficits noted.   Assessment & Plan  HTN (hypertension) Controlled, no change in medication   Flank pain Us  kidney, resistant HTN and CKD, with new flank pain  Hip pain, acute, left X ray hip  Anemia in chronic kidney disease Update iron stores and level pt's daughter request  Left-sided chest pain Acute onset, CXR to further eval, location is posterior and localized to lower ribs 10 and 11  Elevated alkaline phosphatase level Marginally elevated , rept in next several weeks, daughter concerned that level has been " trending up"  Resistant hypertension Improved, no med change, managed by Cardiology DASH diet and commitment to daily physical activity for a minimum of 30 minutes discussed and encouraged, as a part of hypertension management. The importance of attaining a healthy weight is also discussed.     03/01/2024    4:55 PM 03/01/2024    4:39 PM 11/10/2023    1:50 PM 10/06/2023    2:45 PM 10/06/2023    2:13 PM 10/06/2023    2:12 PM 10/06/2023    1:48 PM  BP/Weight  Systolic BP 148 175 130 158 167 181 154  Diastolic BP 82 85 78 80 78 84 73  Wt. (Lbs)  136.08 134.8   135  BMI  22.64 kg/m2 21.11 kg/m2   21.14 kg/m2        Hyperlipidemia LDL goal <70 Hyperlipidemia:Low fat diet discussed and encouraged.   Lipid Panel  Lab Results  Component Value Date   CHOL 185 02/23/2024   HDL 102 02/23/2024   LDLCALC 67 02/23/2024   TRIG 89 02/23/2024   CHOLHDL 1.8 02/23/2024     Controlled, no change in medication   Type 2 diabetes mellitus with nephropathy (HCC) Diabetes associated with hypertension, hyperlipidemia, CKD, and heart disease  Mikayla Jenkins is reminded of the importance of commitment to daily physical activity for 30 minutes or more, as able  and the need to limit carbohydrate intake to 30 to 60 grams per meal to help with blood sugar control.  Controlled, no med change  The need to take medication as prescribed, test blood sugar as directed, and to call between visits if there is a concern that blood sugar is uncontrolled is also discussed.   Mikayla Jenkins is reminded of the importance of daily foot exam, annual eye examination, and good blood sugar, blood pressure and cholesterol control.     Latest Ref Rng & Units 02/23/2024   11:06 AM 10/03/2023    3:17 PM 07/06/2023    2:44 PM 06/21/2023    2:07 PM 06/17/2023    4:06 PM  Diabetic Labs  HbA1c 4.8 - 5.6 % 6.6  6.2   6.1    Chol 100 - 199 mg/dL 161    096    HDL >04 mg/dL 540    981    Calc LDL 0 - 99 mg/dL 67    64    Triglycerides 0 - 149 mg/dL 89    54    Creatinine 0.57 - 1.00 mg/dL 1.91  4.78  2.95  6.21  1.33       03/01/2024    4:55 PM 03/01/2024    4:39 PM 11/10/2023    1:50 PM 10/06/2023    2:45 PM 10/06/2023    2:13 PM 10/06/2023    2:12 PM 10/06/2023    1:48 PM  BP/Weight  Systolic BP 148 175 130 158 167 181 154  Diastolic BP 82 85 78 80 78 84 73  Wt. (Lbs)  136.08 134.8   135   BMI  22.64 kg/m2 21.11 kg/m2   21.14 kg/m2       Latest Ref Rng & Units 11/30/2022   12:00 AM 12/29/2020   12:00 AM  Foot/eye exam completion dates  Eye Exam No Retinopathy No Retinopathy     No Retinopathy         This result is from an external source.

## 2024-03-05 NOTE — Assessment & Plan Note (Signed)
 Improved, no med change, managed by Cardiology DASH diet and commitment to daily physical activity for a minimum of 30 minutes discussed and encouraged, as a part of hypertension management. The importance of attaining a healthy weight is also discussed.     03/01/2024    4:55 PM 03/01/2024    4:39 PM 11/10/2023    1:50 PM 10/06/2023    2:45 PM 10/06/2023    2:13 PM 10/06/2023    2:12 PM 10/06/2023    1:48 PM  BP/Weight  Systolic BP 148 175 130 158 167 181 154  Diastolic BP 82 85 78 80 78 84 73  Wt. (Lbs)  136.08 134.8   135   BMI  22.64 kg/m2 21.11 kg/m2   21.14 kg/m2

## 2024-03-05 NOTE — Assessment & Plan Note (Signed)
 Marginally elevated , rept in next several weeks, daughter concerned that level has been " trending up"

## 2024-03-05 NOTE — Assessment & Plan Note (Signed)
 Diabetes associated with hypertension, hyperlipidemia, CKD, and heart disease  Ms. Haeberle is reminded of the importance of commitment to daily physical activity for 30 minutes or more, as able and the need to limit carbohydrate intake to 30 to 60 grams per meal to help with blood sugar control.  Controlled, no med change  The need to take medication as prescribed, test blood sugar as directed, and to call between visits if there is a concern that blood sugar is uncontrolled is also discussed.   Ms. Helin is reminded of the importance of daily foot exam, annual eye examination, and good blood sugar, blood pressure and cholesterol control.     Latest Ref Rng & Units 02/23/2024   11:06 AM 10/03/2023    3:17 PM 07/06/2023    2:44 PM 06/21/2023    2:07 PM 06/17/2023    4:06 PM  Diabetic Labs  HbA1c 4.8 - 5.6 % 6.6  6.2   6.1    Chol 100 - 199 mg/dL 161    096    HDL >04 mg/dL 540    981    Calc LDL 0 - 99 mg/dL 67    64    Triglycerides 0 - 149 mg/dL 89    54    Creatinine 0.57 - 1.00 mg/dL 1.91  4.78  2.95  6.21  1.33       03/01/2024    4:55 PM 03/01/2024    4:39 PM 11/10/2023    1:50 PM 10/06/2023    2:45 PM 10/06/2023    2:13 PM 10/06/2023    2:12 PM 10/06/2023    1:48 PM  BP/Weight  Systolic BP 148 175 130 158 167 181 154  Diastolic BP 82 85 78 80 78 84 73  Wt. (Lbs)  136.08 134.8   135   BMI  22.64 kg/m2 21.11 kg/m2   21.14 kg/m2       Latest Ref Rng & Units 11/30/2022   12:00 AM 12/29/2020   12:00 AM  Foot/eye exam completion dates  Eye Exam No Retinopathy No Retinopathy     No Retinopathy         This result is from an external source.

## 2024-03-06 ENCOUNTER — Ambulatory Visit: Payer: Self-pay | Admitting: Family Medicine

## 2024-03-06 LAB — IRON,TIBC AND FERRITIN PANEL
Ferritin: 155 ng/mL — ABNORMAL HIGH (ref 15–150)
Iron Saturation: 27 % (ref 15–55)
Iron: 76 ug/dL (ref 27–139)
Total Iron Binding Capacity: 284 ug/dL (ref 250–450)
UIBC: 208 ug/dL (ref 118–369)

## 2024-03-06 LAB — VITAMIN D 25 HYDROXY (VIT D DEFICIENCY, FRACTURES): Vit D, 25-Hydroxy: 50.7 ng/mL (ref 30.0–100.0)

## 2024-03-08 ENCOUNTER — Ambulatory Visit (HOSPITAL_COMMUNITY)
Admission: RE | Admit: 2024-03-08 | Discharge: 2024-03-08 | Disposition: A | Discharge status: Home / Self Care | Source: Ambulatory Visit | Attending: Family Medicine | Admitting: Family Medicine

## 2024-03-08 DIAGNOSIS — M25552 Pain in left hip: Secondary | ICD-10-CM | POA: Diagnosis not present

## 2024-03-08 DIAGNOSIS — R079 Chest pain, unspecified: Secondary | ICD-10-CM | POA: Diagnosis not present

## 2024-03-08 DIAGNOSIS — M1612 Unilateral primary osteoarthritis, left hip: Secondary | ICD-10-CM | POA: Diagnosis not present

## 2024-03-08 DIAGNOSIS — I1A0 Resistant hypertension: Secondary | ICD-10-CM | POA: Diagnosis not present

## 2024-03-08 DIAGNOSIS — Z96641 Presence of right artificial hip joint: Secondary | ICD-10-CM | POA: Diagnosis not present

## 2024-03-08 DIAGNOSIS — I10 Essential (primary) hypertension: Secondary | ICD-10-CM | POA: Diagnosis not present

## 2024-03-22 DIAGNOSIS — D7589 Other specified diseases of blood and blood-forming organs: Secondary | ICD-10-CM | POA: Diagnosis not present

## 2024-03-22 DIAGNOSIS — R748 Abnormal levels of other serum enzymes: Secondary | ICD-10-CM | POA: Diagnosis not present

## 2024-03-22 DIAGNOSIS — D539 Nutritional anemia, unspecified: Secondary | ICD-10-CM | POA: Diagnosis not present

## 2024-03-22 DIAGNOSIS — R809 Proteinuria, unspecified: Secondary | ICD-10-CM | POA: Diagnosis not present

## 2024-03-22 DIAGNOSIS — D638 Anemia in other chronic diseases classified elsewhere: Secondary | ICD-10-CM | POA: Diagnosis not present

## 2024-03-22 DIAGNOSIS — E211 Secondary hyperparathyroidism, not elsewhere classified: Secondary | ICD-10-CM | POA: Diagnosis not present

## 2024-03-22 DIAGNOSIS — N189 Chronic kidney disease, unspecified: Secondary | ICD-10-CM | POA: Diagnosis not present

## 2024-03-22 DIAGNOSIS — D631 Anemia in chronic kidney disease: Secondary | ICD-10-CM | POA: Diagnosis not present

## 2024-03-24 LAB — HEPATIC FUNCTION PANEL
ALT: 22 IU/L (ref 0–32)
AST: 27 IU/L (ref 0–40)
Albumin: 4.6 g/dL (ref 3.8–4.8)
Alkaline Phosphatase: 114 IU/L (ref 44–121)
Bilirubin Total: 0.7 mg/dL (ref 0.0–1.2)
Bilirubin, Direct: 0.29 mg/dL (ref 0.00–0.40)
Total Protein: 6.8 g/dL (ref 6.0–8.5)

## 2024-03-26 ENCOUNTER — Other Ambulatory Visit (HOSPITAL_COMMUNITY)

## 2024-03-30 DIAGNOSIS — N1831 Chronic kidney disease, stage 3a: Secondary | ICD-10-CM | POA: Diagnosis not present

## 2024-03-30 DIAGNOSIS — I5042 Chronic combined systolic (congestive) and diastolic (congestive) heart failure: Secondary | ICD-10-CM | POA: Diagnosis not present

## 2024-03-30 DIAGNOSIS — G4733 Obstructive sleep apnea (adult) (pediatric): Secondary | ICD-10-CM | POA: Diagnosis not present

## 2024-03-30 DIAGNOSIS — E876 Hypokalemia: Secondary | ICD-10-CM | POA: Diagnosis not present

## 2024-04-05 ENCOUNTER — Ambulatory Visit (INDEPENDENT_AMBULATORY_CARE_PROVIDER_SITE_OTHER): Payer: Medicare HMO | Admitting: Gastroenterology

## 2024-04-05 ENCOUNTER — Encounter (INDEPENDENT_AMBULATORY_CARE_PROVIDER_SITE_OTHER): Payer: Self-pay | Admitting: Gastroenterology

## 2024-04-05 VITALS — BP 148/81 | HR 60 | Temp 98.2°F | Ht 65.0 in | Wt 137.1 lb

## 2024-04-05 DIAGNOSIS — K219 Gastro-esophageal reflux disease without esophagitis: Secondary | ICD-10-CM | POA: Insufficient documentation

## 2024-04-05 NOTE — Patient Instructions (Signed)
 We will continue with omeprazole  40mg  daily We will plan to follow up in 1 year unless you have any new GI symptoms, for which we would be happy to see you sooner  It was a pleasure to see you today. I want to create trusting relationships with patients and provide genuine, compassionate, and quality care. I truly value your feedback! please be on the lookout for a survey regarding your visit with me today. I appreciate your input about our visit and your time in completing this!    Ha Shannahan L. Cledith Abdou, MSN, APRN, AGNP-C Adult-Gerontology Nurse Practitioner [facility] at [facility]

## 2024-04-05 NOTE — Progress Notes (Addendum)
 Referring Provider: Antonetta Rollene BRAVO, MD Primary Care Physician:  Antonetta Rollene BRAVO, MD Primary GI Physician: Dr. Eartha   Chief Complaint  Patient presents with   Follow-up    Pt arrives for follow up. Pt states things are going good. No issues/concerns/questions.   HPI:   Mikayla Jenkins is a 78 y.o. female with past medical history of DM II, HLD, HTN, renal disorder   Patient presenting today for:  Follow up of GERD and early satiety   Last seen December 2024, at that time had gained some weight, appetite improved, no GI complaints.   Recommended to decrease omeprazole  to 20mg  daily, follow with PCP regarding elevated BP  Present:  Doing well today. Tried dropping to 20mg  of omeprazole  but had breakthrough so she resumed 40mg  daily dosing and doing well on this. Appetite is good, weight is stable. No GI complaints today. Her daughter does inquire about long term use of PPI and recently elevated Alk phos which was normal on repeat labs  on 6/5.   No red flag symptoms. Patient denies melena, hematochezia, nausea, vomiting, diarrhea, constipation, dysphagia, odyonophagia, early satiety or weight loss.    EGD: 12/2022 - 2 cm hiatal hernia. - Gastritis. Biopsied. - Normal examined duodenum. Cologuard: 10/2019 negative  Last Colonoscopy:2016 Examination performed to cecum. Redundant colon with no evidence of colonic polyps. Small internal and external hemorrhoids.    Past Medical History:  Diagnosis Date   Diabetes mellitus type II    Heart failure with improved ejection fraction (HFimpEF) (HCC)    a. diagnosed in 03/2023 with EF 25-30% by echo and 41% by cMRI b. EF 50-55% by echo in 09/2023   Heart murmur    Hyperlipidemia    Hypertension    Renal disorder    Sleep apnea     Past Surgical History:  Procedure Laterality Date   ABDOMINAL HYSTERECTOMY     BIOPSY  12/28/2022   Procedure: BIOPSY;  Surgeon: Eartha Angelia Sieving, MD;  Location: [facility];   Service: Gastroenterology;;   COLONOSCOPY N/A 06/13/2015   Procedure: COLONOSCOPY;  Surgeon: Claudis RAYMOND Rivet, MD;  Location: [facility];  Service: Endoscopy;  Laterality: N/A;  930-rescheduled 8/26 @ 8:30am Ann to notify pt   CORONARY/GRAFT ACUTE MI REVASCULARIZATION N/A 03/16/2023   Procedure: Coronary/Graft Acute MI Revascularization;  Surgeon: Anner Alm ORN, MD;  Location[facility] INVASIVE CV LAB;  Service: Cardiovascular;  Laterality: N/A;   ESOPHAGOGASTRODUODENOSCOPY (EGD) WITH PROPOFOL  N/A 12/28/2022   Procedure: ESOPHAGOGASTRODUODENOSCOPY (EGD) WITH PROPOFOL ;  Surgeon: Eartha Angelia Sieving, MD;  Location: [facility];  Service: Gastroenterology;  Laterality: N/A;  115pm, asa 3   HERNIA REPAIR     inguinal herniorrhapy right     LEFT HEART CATH AND CORONARY ANGIOGRAPHY N/A 03/16/2023   Procedure: LEFT HEART CATH AND CORONARY ANGIOGRAPHY;  Surgeon: Anner Alm ORN, MD;  Location: [facility] INVASIVE CV LAB;  Service: Cardiovascular;  Laterality: N/A;   right eye surgery secondary to right eye weakness     SALPINGOOPHORECTOMY     TOTAL HIP ARTHROPLASTY Right 01/06/2021   TOTAL HIP ARTHROPLASTY Right 01/06/2021   Procedure: RIGHT TOTAL HIP ARTHROPLASTY ANTERIOR APPROACH;  Surgeon: Vernetta Lonni GRADE, MD;  Location: [facility] OR;  Service: Orthopedics;  Laterality: Right;   TUBAL LIGATION      Current Outpatient Medications  Medication Sig Dispense Refill   acetaminophen  (TYLENOL ) 650 MG CR tablet Take 650 mg by mouth every 8 (eight) hours as needed for pain.  carvedilol  (COREG ) 6.25 MG tablet TAKE 1 TABLET TWICE DAILY 180 tablet 3   furosemide  (LASIX ) 20 MG tablet Take Two times a week on Tuesday and Thursday 10 tablet 11   glipiZIDE  (GLUCOTROL  XL) 2.5 MG 24 hr tablet TAKE 1 TABLET WITH BREAKFAST EVERY TUESDAY, THURSDAY AND SATURDAY 36 tablet 3   losartan  (COZAAR ) 100 MG tablet Take 1 tablet (100 mg total) by mouth daily. 90 tablet 3   omeprazole  (PRILOSEC) 40 MG capsule TAKE 1 CAPSULE  EVERY DAY 90 capsule 3   potassium chloride  SA (KLOR-CON  M) 20 MEQ tablet Take 1 tablet (20 mEq total) by mouth 2 (two) times a week. Take on the days you take Lasix . 30 tablet 5   rosuvastatin  (CRESTOR ) 40 MG tablet TAKE 1 TABLET EVERY DAY 90 tablet 3   ferrous sulfate  325 (65 FE) MG EC tablet Take 1 tablet (325 mg total) by mouth daily with breakfast. (Patient not taking: Reported on 04/05/2024)     No current facility-administered medications for this visit.    Allergies as of 04/05/2024 - Review Complete 04/05/2024  Allergen Reaction Noted   Metformin  and related Other (See Comments) 01/25/2019    Social History   Socioeconomic History   Marital status: Married    Spouse name: Mikayla Jenkins   Number of children: 3   Years of education: Not on file   Highest education level: High school graduate  Occupational History   Occupation: Retired  Tobacco Use   Smoking status: Never    Passive exposure: Never   Smokeless tobacco: Never  Vaping Use   Vaping status: Never Used  Substance and Sexual Activity   Alcohol use: No   Drug use: No   Sexual activity: Yes    Birth control/protection: Surgical  Other Topics Concern   Not on file  Social History Narrative   Not on file   Social Drivers of Health   Financial Resource Strain: Low Risk  (11/16/2023)   Overall Financial Resource Strain (CARDIA)    Difficulty of Paying Living Expenses: Not hard at all  Food Insecurity: No Food Insecurity (11/16/2023)   Hunger Vital Sign    Worried About Running Out of Food in the Last Year: Never true    Ran Out of Food in the Last Year: Never true  Transportation Needs: No Transportation Needs (11/16/2023)   PRAPARE - Administrator, Civil Service (Medical): No    Lack of Transportation (Non-Medical): No  Physical Activity: Insufficiently Active (05/31/2022)   Exercise Vital Sign    Days of Exercise per Week: 3 days    Minutes of Exercise per Session: 30 min  Stress: No Stress Concern  Present (11/16/2023)   [employer] of Occupational Health - Occupational Stress Questionnaire    Feeling of Stress : Only a little  Social Connections: Moderately Integrated (11/16/2023)   Social Connection and Isolation Panel    Frequency of Communication with Friends and Family: Three times a week    Frequency of Social Gatherings with Friends and Family: Once a week    Attends Religious Services: More than 4 times per year    Active Member of [employer] or Organizations: Yes    Attends Banker Meetings: More than 4 times per year    Marital Status: Widowed    Review of systems General: negative for malaise, night sweats, fever, chills, weight loss Neck: Negative for lumps, goiter, pain and significant neck swelling Resp: Negative for cough, wheezing, dyspnea at rest  CV: Negative for chest pain, leg swelling, palpitations, orthopnea GI: denies melena, hematochezia, nausea, vomiting, diarrhea, constipation, dysphagia, odyonophagia, early satiety or unintentional weight loss.   Physical Exam: BP (!) 158/76   Pulse 60   Temp 98.2 F (36.8 C)   Ht 5' 5 (1.651 m)   Wt 137 lb 1.6 oz (62.2 kg)   BMI 22.81 kg/m  General:   Alert and oriented. No distress noted. Pleasant and cooperative.  Head:  Normocephalic and atraumatic. Eyes:  Conjuctiva clear without scleral icterus. Mouth:  Oral mucosa pink and moist. Good dentition. No lesions. Heart: Normal rate and rhythm, s1 and s2 heart sounds present.  Lungs: Clear lung sounds in all lobes. Respirations equal and unlabored. Abdomen:  +BS, soft, non-tender and non-distended. No rebound or guarding. No HSM or masses noted. Neurologic:  Alert and  oriented x4 Psych:  Alert and cooperative. Normal mood and affect.  Invalid input(s): 6 MONTHS   ASSESSMENT: Mikayla Jenkins is a 78 y.o. female presenting today for follow up of GERD and early satiety  Doing well on omeprazole  40mg  daily. Previously tried dropping to 20mg  but had  recurrence of decreased appetite, belching and coughing, therefore was advised to go back to 40mg  daily. Appetite is good. Her weight is stable, up about 9 pounds since October 2024. No GI complaints today. Patient's daughter present at visit did inquire about long term use of PPI and safety. Patient and daughter were re-assured regarding PPI use and safety of when appropriate indications in question. Most recent studies on PPI therapy that association of symptoms is not equivalent to causation and overall association with for example osteoporosis is weak and based on observational studies. When PPI use is indicated, it is safe to proceed with therapy and titrate dosing/use based on symptom response.   PLAN:  -continue omeprazole  20mg  daily -good reflux precautions  All questions were answered, patient verbalized understanding and is in agreement with plan as outlined above.   Follow Up: 1 year   Mattilyn Crites L. Mariette, MSN, APRN, AGNP-C Adult-Gerontology Nurse Practitioner [facility]  I have reviewed the note and agree with the APP's assessment as described in this progress note  Toribio Fortune, MD Gastroenterology and Hepatology [facility]

## 2024-05-14 ENCOUNTER — Ambulatory Visit

## 2024-05-15 ENCOUNTER — Encounter: Payer: Medicare HMO | Admitting: *Deleted

## 2024-05-24 ENCOUNTER — Ambulatory Visit: Admitting: Student

## 2024-05-31 ENCOUNTER — Ambulatory Visit: Admitting: Internal Medicine

## 2024-06-14 ENCOUNTER — Telehealth: Payer: Self-pay

## 2024-06-14 ENCOUNTER — Other Ambulatory Visit: Payer: Self-pay

## 2024-06-14 DIAGNOSIS — E1121 Type 2 diabetes mellitus with diabetic nephropathy: Secondary | ICD-10-CM | POA: Diagnosis not present

## 2024-06-14 DIAGNOSIS — N1831 Chronic kidney disease, stage 3a: Secondary | ICD-10-CM | POA: Diagnosis not present

## 2024-06-14 NOTE — Telephone Encounter (Signed)
 Copied from CRM [id number]. Topic: Clinical - Request for Lab/Test Order >> Jun 14, 2024  9:05 AM Selinda RAMAN wrote: Reason for CRM: Jon the daughter of the patient called in stating she normally goes to get labs done the week before her appointments. She normally gets a paper with the lab orders but never got one this time. Jon is in town today from [place] and needs to take her mom today to get these labs done. She also wants to know if these are fasting labs or not. Please assist as soon as possible and let her know orders have been put in so she can bring her mother in to get the labs drawn.

## 2024-06-14 NOTE — Telephone Encounter (Signed)
 Ordered, pt daughter informed

## 2024-06-15 DIAGNOSIS — H2513 Age-related nuclear cataract, bilateral: Secondary | ICD-10-CM | POA: Diagnosis not present

## 2024-06-15 DIAGNOSIS — E113292 Type 2 diabetes mellitus with mild nonproliferative diabetic retinopathy without macular edema, left eye: Secondary | ICD-10-CM | POA: Diagnosis not present

## 2024-06-15 LAB — HM DIABETES EYE EXAM

## 2024-06-16 LAB — CMP14+EGFR
ALT: 20 IU/L (ref 0–32)
AST: 25 IU/L (ref 0–40)
Albumin: 4.6 g/dL (ref 3.8–4.8)
Alkaline Phosphatase: 114 IU/L (ref 44–121)
BUN/Creatinine Ratio: 14 (ref 12–28)
BUN: 19 mg/dL (ref 8–27)
Bilirubin Total: 0.7 mg/dL (ref 0.0–1.2)
CO2: 21 mmol/L (ref 20–29)
Calcium: 9.7 mg/dL (ref 8.7–10.3)
Chloride: 101 mmol/L (ref 96–106)
Creatinine, Ser: 1.35 mg/dL — ABNORMAL HIGH (ref 0.57–1.00)
Globulin, Total: 2.2 g/dL (ref 1.5–4.5)
Glucose: 122 mg/dL — ABNORMAL HIGH (ref 70–99)
Potassium: 4.4 mmol/L (ref 3.5–5.2)
Sodium: 141 mmol/L (ref 134–144)
Total Protein: 6.8 g/dL (ref 6.0–8.5)
eGFR: 40 mL/min/1.73 — ABNORMAL LOW (ref >59)

## 2024-06-16 LAB — MICROALBUMIN / CREATININE URINE RATIO
Creatinine, Urine: 30.1 mg/dL
Microalb/Creat Ratio: 29 mg/g{creat} (ref 0–29)
Microalbumin, Urine: 8.7 ug/mL

## 2024-06-16 LAB — HEMOGLOBIN A1C
Est. average glucose Bld gHb Est-mCnc: 134 mg/dL
Hgb A1c MFr Bld: 6.3 % — ABNORMAL HIGH (ref 4.8–5.6)

## 2024-06-17 ENCOUNTER — Ambulatory Visit: Payer: Self-pay | Admitting: Family Medicine

## 2024-06-21 ENCOUNTER — Encounter: Payer: Self-pay | Admitting: Family Medicine

## 2024-06-21 ENCOUNTER — Ambulatory Visit (INDEPENDENT_AMBULATORY_CARE_PROVIDER_SITE_OTHER): Admitting: Family Medicine

## 2024-06-21 VITALS — BP 180/90 | HR 64 | Resp 16 | Ht 65.0 in | Wt 138.0 lb

## 2024-06-21 DIAGNOSIS — I1A0 Resistant hypertension: Secondary | ICD-10-CM

## 2024-06-21 DIAGNOSIS — Z23 Encounter for immunization: Secondary | ICD-10-CM

## 2024-06-21 DIAGNOSIS — D631 Anemia in chronic kidney disease: Secondary | ICD-10-CM | POA: Diagnosis not present

## 2024-06-21 DIAGNOSIS — E785 Hyperlipidemia, unspecified: Secondary | ICD-10-CM

## 2024-06-21 DIAGNOSIS — N1831 Chronic kidney disease, stage 3a: Secondary | ICD-10-CM

## 2024-06-21 DIAGNOSIS — E1121 Type 2 diabetes mellitus with diabetic nephropathy: Secondary | ICD-10-CM

## 2024-06-21 NOTE — Assessment & Plan Note (Signed)
 Hyperlipidemia:Low fat diet discussed and encouraged.   Lipid Panel  Lab Results  Component Value Date   CHOL 185 02/23/2024   HDL 102 02/23/2024   LDLCALC 67 02/23/2024   TRIG 89 02/23/2024   CHOLHDL 1.8 02/23/2024     Controlled, no change in medication

## 2024-06-21 NOTE — Assessment & Plan Note (Signed)
 After obtaining informed consent, the influenza vaccine is  administered , with no adverse effect noted at the time of administration.

## 2024-06-21 NOTE — Assessment & Plan Note (Signed)
 Markedly elevated at visit, followed byNephrology and cardiology, my understanding is that cardiology is primarily esponsible, daughter tpo reach out to cardiology if she gets elevated blood pressure readings at home DASH diet and commitment to daily physical activity for a minimum of 30 minutes discussed and encouraged, as a part of hypertension management. The importance of attaining a healthy weight is also discussed.     06/21/2024    2:45 PM 06/21/2024    2:44 PM 06/21/2024    2:16 PM 04/05/2024    2:53 PM 04/05/2024    2:29 PM 03/01/2024    4:55 PM 03/01/2024    4:39 PM  BP/Weight  Systolic BP 180 178 187 148 158 148 175  Diastolic BP 90 90 86 81 76 82 85  Wt. (Lbs)   138.04  137.1  136.08  BMI   22.97 kg/m2  22.81 kg/m2  22.64 kg/m2

## 2024-06-21 NOTE — Patient Instructions (Addendum)
 Keep December appt pls  Flu vaccine today  LABS AND EXAM ARE GOOD except BLOOD PRESSURE WHICH IS VERY HIGH AT TODAY'S VISIT  PLS CHECK IT AT HOME, IF YOU KEEP GETTING IT ABOVE 170, YOU NEED TO CONTACT CARDIOLOGY AND GET IN SOONER FOR YOUR BLOOD PRESSURE TO BE MANAGED  Fasting lipid, bmp and EGFR and CBC to be drawn 3 to 5 days before Dec appt  Thanks for choosing [facility], we consider it a privelige to serve you.

## 2024-06-21 NOTE — Assessment & Plan Note (Signed)
 Diabetes associated with hypertension, hyperlipidemia, and CKD  Mikayla Jenkins is reminded of the importance of commitment to daily physical activity for 30 minutes or more, as able and the need to limit carbohydrate intake to 30 to 60 grams per meal to help with blood sugar control.   The need to take medication as prescribed, test blood sugar as directed, and to call between visits if there is a concern that blood sugar is uncontrolled is also discussed.   Mikayla Jenkins is reminded of the importance of daily foot exam, annual eye examination, and good blood sugar, blood pressure and cholesterol control.     Latest Ref Rng & Units 06/14/2024    3:41 PM 02/23/2024   11:06 AM 10/03/2023    3:17 PM 07/06/2023    2:44 PM 06/21/2023    2:07 PM  Diabetic Labs  HbA1c 4.8 - 5.6 % 6.3  6.6  6.2   6.1   Micro/Creat Ratio 0 - 29 mg/g creat 29       Chol 100 - 199 mg/dL  814    814   HDL >60 mg/dL  897    889   Calc LDL 0 - 99 mg/dL  67    64   Triglycerides 0 - 149 mg/dL  89    54   Creatinine 0.57 - 1.00 mg/dL 8.64  8.85  8.79  8.73  1.28       06/21/2024    2:45 PM 06/21/2024    2:44 PM 06/21/2024    2:16 PM 04/05/2024    2:53 PM 04/05/2024    2:29 PM 03/01/2024    4:55 PM 03/01/2024    4:39 PM  BP/Weight  Systolic BP 180 178 187 148 158 148 175  Diastolic BP 90 90 86 81 76 82 85  Wt. (Lbs)   138.04  137.1  136.08  BMI   22.97 kg/m2  22.81 kg/m2  22.64 kg/m2      Latest Ref Rng & Units 06/15/2024   12:00 AM 11/30/2022   12:00 AM  Foot/eye exam completion dates  Eye Exam No Retinopathy Retinopathy     No Retinopathy         This result is from an external source.      Improved and well controlled  no management change

## 2024-06-21 NOTE — Progress Notes (Signed)
 Mikayla Jenkins     MRN: [id number]      DOB: [birth year]  Chief Complaint  Patient presents with   Medical Management of Chronic Issues    Follow up     HPI Mikayla Jenkins is here for follow up and re-evaluation of chronic medical conditions, medication management and review of any available recent lab and radiology data.  Preventive health is updated, specifically  Cancer screening and Immunization.   Questions or concerns regarding consultations or procedures which the PT has had in the interim are  addressed. The PT denies any adverse reactions to current medications since the last visit.  There are no new concerns.  Daughter has not been checking blood sugar regularly, states when she does it is around 150. Pt takes meds faithfully as prescribed Has no conncerns or complaints   ROS Denies recent fever or chills. Denies sinus pressure, nasal congestion, ear pain or sore throat. Denies chest congestion, productive cough or wheezing. Denies chest pains, palpitations and leg swelling Denies abdominal pain, nausea, vomiting,diarrhea or constipation.   Denies dysuria, frequency, hesitancy or incontinence. Denies joint pain, swelling and limitation in mobility. Denies headaches, seizures, numbness, or tingling. Denies depression, anxiety or insomnia. Denies skin break down or rash.   PE  BP (!) 180/90   Pulse 64   Resp 16   Ht 5' 5 (1.651 m)   Wt 138 lb 0.6 oz (62.6 kg)   SpO2 96%   BMI 22.97 kg/m   Patient alert and oriented  HEENT: No facial asymmetry, EOMI,     Neck supple .  Chest: Clear to auscultation bilaterally.  CVS: S1, S2 no murmurs, no S3.Regular rate.  ABD: Soft non tender.   Ext: No edema  MS: Adequate though reduced  ROM spine, shoulders, hips and knees.  Skin: Intact, no ulcerations or rash noted.  Psych: Good eye contact, normal affect. Memory intact mildly  anxious not  depressed appearing.  CNS: CN 2-12 intact, power,  normal throughout.no focal  deficits noted.   Assessment & Plan  Hyperlipidemia LDL goal <70 Hyperlipidemia:Low fat diet discussed and encouraged.   Lipid Panel  Lab Results  Component Value Date   CHOL 185 02/23/2024   HDL 102 02/23/2024   LDLCALC 67 02/23/2024   TRIG 89 02/23/2024   CHOLHDL 1.8 02/23/2024     Controlled, no change in medication   Resistant hypertension Markedly elevated at visit, followed byNephrology and cardiology, my understanding is that cardiology is primarily esponsible, daughter tpo reach out to cardiology if she gets elevated blood pressure readings at home DASH diet and commitment to daily physical activity for a minimum of 30 minutes discussed and encouraged, as a part of hypertension management. The importance of attaining a healthy weight is also discussed.     06/21/2024    2:45 PM 06/21/2024    2:44 PM 06/21/2024    2:16 PM 04/05/2024    2:53 PM 04/05/2024    2:29 PM 03/01/2024    4:55 PM 03/01/2024    4:39 PM  BP/Weight  Systolic BP 180 178 187 148 158 148 175  Diastolic BP 90 90 86 81 76 82 85  Wt. (Lbs)   138.04  137.1  136.08  BMI   22.97 kg/m2  22.81 kg/m2  22.64 kg/m2       Type 2 diabetes mellitus with nephropathy (HCC) Diabetes associated with hypertension, hyperlipidemia, and CKD  Mikayla Jenkins is reminded of the importance of commitment to daily  physical activity for 30 minutes or more, as able and the need to limit carbohydrate intake to 30 to 60 grams per meal to help with blood sugar control.   The need to take medication as prescribed, test blood sugar as directed, and to call between visits if there is a concern that blood sugar is uncontrolled is also discussed.   Mikayla Jenkins is reminded of the importance of daily foot exam, annual eye examination, and good blood sugar, blood pressure and cholesterol control.     Latest Ref Rng & Units 06/14/2024    3:41 PM 02/23/2024   11:06 AM 10/03/2023    3:17 PM 07/06/2023    2:44 PM 06/21/2023    2:07 PM  Diabetic  Labs  HbA1c 4.8 - 5.6 % 6.3  6.6  6.2   6.1   Micro/Creat Ratio 0 - 29 mg/g creat 29       Chol 100 - 199 mg/dL  814    814   HDL >60 mg/dL  897    889   Calc LDL 0 - 99 mg/dL  67    64   Triglycerides 0 - 149 mg/dL  89    54   Creatinine 0.57 - 1.00 mg/dL 8.64  8.85  8.79  8.73  1.28       06/21/2024    2:45 PM 06/21/2024    2:44 PM 06/21/2024    2:16 PM 04/05/2024    2:53 PM 04/05/2024    2:29 PM 03/01/2024    4:55 PM 03/01/2024    4:39 PM  BP/Weight  Systolic BP 180 178 187 148 158 148 175  Diastolic BP 90 90 86 81 76 82 85  Wt. (Lbs)   138.04  137.1  136.08  BMI   22.97 kg/m2  22.81 kg/m2  22.64 kg/m2      Latest Ref Rng & Units 06/15/2024   12:00 AM 11/30/2022   12:00 AM  Foot/eye exam completion dates  Eye Exam No Retinopathy Retinopathy     No Retinopathy         This result is from an external source.      Improved and well controlled  no management change

## 2024-06-22 ENCOUNTER — Other Ambulatory Visit: Payer: Self-pay | Admitting: Student

## 2024-06-22 ENCOUNTER — Other Ambulatory Visit: Payer: Self-pay | Admitting: Internal Medicine

## 2024-07-05 ENCOUNTER — Ambulatory Visit: Admitting: Podiatry

## 2024-07-05 ENCOUNTER — Encounter: Payer: Self-pay | Admitting: Family Medicine

## 2024-07-05 ENCOUNTER — Encounter: Payer: Self-pay | Admitting: Podiatry

## 2024-07-05 DIAGNOSIS — M79674 Pain in right toe(s): Secondary | ICD-10-CM | POA: Diagnosis not present

## 2024-07-05 DIAGNOSIS — M79675 Pain in left toe(s): Secondary | ICD-10-CM

## 2024-07-05 DIAGNOSIS — B351 Tinea unguium: Secondary | ICD-10-CM

## 2024-07-05 NOTE — Progress Notes (Signed)
 Subjective:  Patient ID: Mikayla Jenkins, female    DOB: 1946-07-08,  MRN: [id number] HPI Chief Complaint  Patient presents with   Debridement    Requesting toenail trim - can't cut herself due to thickness, tender with shoes, diabetic - 6.3   New Patient (Initial Visit)    78 y.o. female presents with the above complaint.   ROS: Denies fever chills nausea mobic muscle aches pains.  Past Medical History:  Diagnosis Date   Diabetes mellitus type II    Heart failure with improved ejection fraction (HFimpEF) (HCC)    a. diagnosed in 03/2023 with EF 25-30% by echo and 41% by cMRI b. EF 50-55% by echo in 09/2023   Heart murmur    Hyperlipidemia    Hypertension    Renal disorder    Sleep apnea    Past Surgical History:  Procedure Laterality Date   ABDOMINAL HYSTERECTOMY     BIOPSY  12/28/2022   Procedure: BIOPSY;  Surgeon: Eartha Angelia Sieving, MD;  Location: [facility];  Service: Gastroenterology;;   COLONOSCOPY N/A 06/13/2015   Procedure: COLONOSCOPY;  Surgeon: Claudis RAYMOND Rivet, MD;  Location: [facility];  Service: Endoscopy;  Laterality: N/A;  930-rescheduled 8/26 @ 8:30am Ann to notify pt   CORONARY/GRAFT ACUTE MI REVASCULARIZATION N/A 03/16/2023   Procedure: Coronary/Graft Acute MI Revascularization;  Surgeon: Anner Alm ORN, MD;  Location: [facility] INVASIVE CV LAB;  Service: Cardiovascular;  Laterality: N/A;   ESOPHAGOGASTRODUODENOSCOPY (EGD) WITH PROPOFOL  N/A 12/28/2022   Procedure: ESOPHAGOGASTRODUODENOSCOPY (EGD) WITH PROPOFOL ;  Surgeon: Eartha Angelia Sieving, MD;  Location: [facility];  Service: Gastroenterology;  Laterality: N/A;  115pm, asa 3   HERNIA REPAIR     inguinal herniorrhapy right     LEFT HEART CATH AND CORONARY ANGIOGRAPHY N/A 03/16/2023   Procedure: LEFT HEART CATH AND CORONARY ANGIOGRAPHY;  Surgeon: Anner Alm ORN, MD;  Location: [facility] INVASIVE CV LAB;  Service: Cardiovascular;  Laterality: N/A;   right eye surgery secondary to right eye weakness      SALPINGOOPHORECTOMY     TOTAL HIP ARTHROPLASTY Right 01/06/2021   TOTAL HIP ARTHROPLASTY Right 01/06/2021   Procedure: RIGHT TOTAL HIP ARTHROPLASTY ANTERIOR APPROACH;  Surgeon: Vernetta Lonni GRADE, MD;  Location: [facility] OR;  Service: Orthopedics;  Laterality: Right;   TUBAL LIGATION      Current Outpatient Medications:    acetaminophen  (TYLENOL ) 650 MG CR tablet, Take 650 mg by mouth every 8 (eight) hours as needed for pain., Disp: , Rfl:    carvedilol  (COREG ) 6.25 MG tablet, TAKE 1 TABLET TWICE DAILY, Disp: 180 tablet, Rfl: 3   ferrous sulfate  325 (65 FE) MG EC tablet, Take 1 tablet (325 mg total) by mouth daily with breakfast. (Patient not taking: Reported on 06/21/2024), Disp: , Rfl:    furosemide  (LASIX ) 20 MG tablet, TAKE 1 TABLET TWICE WEEKLY ON TUESDAY AND THURSDAY, Disp: 24 tablet, Rfl: 3   glipiZIDE  (GLUCOTROL  XL) 2.5 MG 24 hr tablet, TAKE 1 TABLET WITH BREAKFAST EVERY TUESDAY, THURSDAY AND SATURDAY, Disp: 36 tablet, Rfl: 3   losartan  (COZAAR ) 100 MG tablet, Take 1 tablet (100 mg total) by mouth daily., Disp: 90 tablet, Rfl: 3   omeprazole  (PRILOSEC) 40 MG capsule, TAKE 1 CAPSULE EVERY DAY, Disp: 90 capsule, Rfl: 3   potassium chloride  SA (KLOR-CON  M) 20 MEQ tablet, Take 1 tablet (20 mEq total) by mouth 2 (two) times a week. Take on the days you take Lasix ., Disp: 30 tablet, Rfl: 5   rosuvastatin  (  CRESTOR ) 40 MG tablet, TAKE 1 TABLET EVERY DAY, Disp: 90 tablet, Rfl: 3  Allergies  Allergen Reactions   Metformin  And Related Other (See Comments)    CKD stage 4   Review of Systems Objective:  There were no vitals filed for this visit.  General: Well developed, nourished, in no acute distress, alert and oriented x3   Dermatological: Skin is warm, dry and supple bilateral. Nails x 10 all thick yellow dystrophic clinically mycotic painful on palpation.  Remaining integument appears unremarkable at this time. There are no open sores, no preulcerative lesions, no rash or signs of infection  present.  Vascular: Dorsalis Pedis artery and Posterior Tibial artery pedal pulses are 2/4 bilateral with immedate capillary fill time. Pedal hair growth present. No varicosities and no lower extremity edema present bilateral.   Neruologic: Grossly intact via light touch bilateral. Vibratory intact via tuning fork bilateral. Protective threshold with Semmes Wienstein monofilament intact to all pedal sites bilateral. Patellar and Achilles deep tendon reflexes 2+ bilateral. No Babinski or clonus noted bilateral.   Musculoskeletal: No gross boney pedal deformities bilateral. No pain, crepitus, or limitation noted with foot and ankle range of motion bilateral. Muscular strength 5/5 in all groups tested bilateral.  Gait: Unassisted, Nonantalgic.    Radiographs:  None taken  Assessment & Plan:   Assessment: Pain in limb secondary to onychomycosis.  Plan: Debridement in thickness and length of mycotic nails today 1 through 5 bilateral which are painful.  She will follow-up with Dr.Galaway.     Georgio Hattabaugh T. Gallina, [place]

## 2024-07-10 ENCOUNTER — Ambulatory Visit

## 2024-07-12 ENCOUNTER — Encounter: Admitting: Family Medicine

## 2024-07-19 ENCOUNTER — Encounter: Payer: Self-pay | Admitting: Student

## 2024-07-19 ENCOUNTER — Ambulatory Visit: Attending: Student | Admitting: Student

## 2024-07-19 VITALS — BP 146/82 | HR 54 | Ht 65.0 in | Wt 140.0 lb

## 2024-07-19 DIAGNOSIS — I214 Non-ST elevation (NSTEMI) myocardial infarction: Secondary | ICD-10-CM | POA: Diagnosis not present

## 2024-07-19 DIAGNOSIS — I5020 Unspecified systolic (congestive) heart failure: Secondary | ICD-10-CM | POA: Diagnosis not present

## 2024-07-19 DIAGNOSIS — E785 Hyperlipidemia, unspecified: Secondary | ICD-10-CM | POA: Diagnosis not present

## 2024-07-19 DIAGNOSIS — I10 Essential (primary) hypertension: Secondary | ICD-10-CM | POA: Diagnosis not present

## 2024-07-19 DIAGNOSIS — N1832 Chronic kidney disease, stage 3b: Secondary | ICD-10-CM | POA: Diagnosis not present

## 2024-07-19 MED ORDER — ROSUVASTATIN CALCIUM 40 MG PO TABS: 40.0000 mg | ORAL_TABLET | Freq: Every day | ORAL | 3 refills | Status: AC

## 2024-07-19 NOTE — Patient Instructions (Addendum)
 Medication Instructions:  Your physician recommends that you continue on your current medications as directed. Please refer to the Current Medication list given to you today.  *If you need a refill on your cardiac medications before your next appointment, please call your pharmacy*  Lab Work: None ordered  If you have labs (blood work) drawn today and your tests are completely normal, you will receive your results only by: MyChart Message (if you have MyChart) OR A paper copy in the mail If you have any lab test that is abnormal or we need to change your treatment, we will call you to review the results.  Testing/Procedures: None ordered  Follow-Up: At [facility], you and your health needs are our priority.  As part of our continuing mission to provide you with exceptional heart care, our providers are all part of one team.  This team includes your primary Cardiologist (physician) and Advanced Practice Providers or APPs (Physician Assistants and Nurse Practitioners) who all work together to provide you with the care you need, when you need it.  Your next appointment:   6 month(s)  Provider:   Vishnu Mallipeddi, MD or Laymon Qua, PA-C    We recommend signing up for the patient portal called MyChart.  Sign up information is provided on this After Visit Summary.  MyChart is used to connect with patients for Virtual Visits (Telemedicine).  Patients are able to view lab/test results, encounter notes, upcoming appointments, etc.  Non-urgent messages can be sent to your provider as well.   To learn more about what you can do with MyChart, go to [email or web address].   Other Instructions Let us  know is blood pressure remains elevated with top number (systolic) greater than 140 or bottom number (diastolic) greater than 90.

## 2024-07-19 NOTE — Progress Notes (Signed)
 Cardiology Office Note    Date:  07/19/2024  ID:  Jetta, Murray 10-Oct-1946, MRN [id number] Cardiologist: Diannah SHAUNNA Maywood, MD Cardiology APP:  Johnson Laymon HERO, PA-C { :  History of Present Illness:    Mikayla Jenkins is a 78 y.o. female with past medical history of HTN, HLD, Type 2 DM, Stage 3 CKD, OSA (on CPAP) and HFimpEF/Takotsubo Cardiomyopathy with LV thrombus (diagnosed in 03/2023 with EF 25-30% by echo and 41% by cMRI, EF at 50-55% by echo in 09/2023) who presents to the office today for 43-month follow-up.  She was last examined by myself in 10/2023 and her husband had recently passed away from cancer. She denied any chest pain or dyspnea on exertion and appeared euvolemic on examination. She was continued on Coreg  6.25 mg twice daily, Losartan  100 mg daily, Lasix  20 mg twice per week and Crestor  40 mg daily. She previously had AKI's with Spironolactone  and SGLT2 inhibitors.  In talking with the patient and her daughter today, they report things have been stable since her last visit. She lives by herself but they check on her routinely. No recent chest pain or dyspnea on exertion when doing routine chores around her home or when walking outside for exercise. No recent palpitations, orthopnea, PND or pitting edema. Her daughter reports she does have whitecoat hypertension and blood pressure is elevated at office visits but is typically well-controlled when checked at home. SBP has routinely been in the 130's. BP was initially elevated at 160/88 during today's visit, rechecked and improved to 146/82.  Studies Reviewed:   EKG: EKG is ordered today and demonstrates:   EKG Interpretation Date/Time:  Thursday July 19 2024 13:54:26 EDT Ventricular Rate:  54 PR Interval:  218 QRS Duration:  102 QT Interval:  436 QTC Calculation: 413 R Axis:   -13  Text Interpretation: Sinus bradycardia with 1st degree A-V block Incomplete right bundle branch block Minimal voltage criteria for  LVH, may be normal variant ( R in aVL ) Confirmed by Johnson Laymon ([id number]) on 07/19/2024 1:59:45 PM       Cardiac Catheterization: 02/2023 Angiographically normal coronary artery disease with no obvious culprit lesion. Codominant system. Difficult to image left ventriculography, would recommend 2D echo but appears to be normal EF. Normal EDP.    Plan: Will need to consider different etiology for troponin elevation however for now we will go ahead and treat as though this could have been ACS with a lesion that cleared with nitroglycerin , aspirin  and heparin .. For now we will treat with DAPT and 40 hours of IV heparin  until other etiology is determined. Check 2D echo to better assess EF and consider cardiac MRI as there is concern for troponin elevation and her discomfort being related to myocarditis. Will continue most home medications but hold the thiazide diuretic and clonidine  nightly. Sliding-scale insulin . Standard labs checked with A1c, lipid panel etc.    Limited Echo: 09/2023 IMPRESSIONS     1. Limited Echo for LV function.   2. Left ventricular ejection fraction, by estimation, is 50 to 55%. Left  ventricular ejection fraction by 2D MOD biplane is 52.9 %. The left  ventricle has low normal function. The left ventricle has no regional wall  motion abnormalities. Left  ventricular diastolic function could not be evaluated.   3. Right ventricular systolic function is normal. The right ventricular  size is normal. Tricuspid regurgitation signal is inadequate for assessing  PA pressure.   Comparison(s): Changes from prior  study are noted. LVEF improved from 40%  to 50-55% now.   Risk Assessment/Calculations:     HYPERTENSION CONTROL Vitals:   07/19/24 1344 07/19/24 1412  BP: (!) 168/88 (!) 146/82    The patient's blood pressure is elevated above target today. In order to address the patient's elevated BP: Blood pressure will be monitored at home to determine if  medication changes need to be made.     Physical Exam:   VS:  BP (!) 146/82   Pulse (!) 54   Ht 5' 5 (1.651 m)   Wt 140 lb (63.5 kg)   SpO2 97%   BMI 23.30 kg/m    Wt Readings from Last 3 Encounters:  07/19/24 140 lb (63.5 kg)  06/21/24 138 lb 0.6 oz (62.6 kg)  04/05/24 137 lb 1.6 oz (62.2 kg)     GEN: Well nourished, well developed female appearing in no acute distress NECK: No JVD; No carotid bruits CARDIAC: RRR, no murmurs, rubs, gallops RESPIRATORY:  Clear to auscultation without rales, wheezing or rhonchi  ABDOMEN: Appears non-distended. No obvious abdominal masses. EXTREMITIES: No clubbing or cyanosis. No pitting edema.  Distal pedal pulses are 2+ bilaterally.   Assessment and Plan:   1. Heart failure with improved ejection fraction (HFimpEF) (HCC) - Her ejection fraction was previously at 25 to 30% in 03/2023 and had improved to 50 to 55% by most recent imaging in 09/2023. - She denies any recent respiratory issues and she appears euvolemic by examination today. Continue current medical therapy with Coreg  6.25 mg twice daily, Losartan  100 mg daily and Lasix  20 mg twice weekly (family has preferred this over PRN dosing for a consistent regimen). Previously had AKI's with Spironolactone  and SGLT2 inhibitors.  2. Non-ST elevation myocardial infarction (NSTEMI), subsequent episode of care [facility]) - She experienced elevated troponin values in the setting of Takotsubo cardiomyopathy and cardiac catheterization in 02/2023 showed normal coronary arteries. - She denies any recent anginal symptoms. Continue with risk factor modification.  3. Essential hypertension - BP is elevated during today's visit but has been well-controlled when checked at home. I encouraged them to follow at home and make us  aware if BP remains elevated. Continue current medical therapy for now with Coreg  6.25 mg twice daily and Losartan  100 mg daily.  Given her heart rate in the 50's, would not be able to  further titrate Coreg . If BP remains above goal, would plan to restart Amlodipine  but at a lower dose of 2.5 mg daily as she was on Amlodipine  in the past and tolerated well.  4. Hyperlipidemia LDL goal <70 - FLP in 02/2024 showed total cholesterol 185, triglycerides 89, HDL 102 and LDL 67. Continue current medical therapy with Crestor  40 mg daily.  5. Stage 3b chronic kidney disease (HCC) - Creatinine was at 1.35 when checked in 05/2024 which is close to her known baseline.  Signed, Laymon CHRISTELLA Qua, PA-C

## 2024-08-01 ENCOUNTER — Encounter (INDEPENDENT_AMBULATORY_CARE_PROVIDER_SITE_OTHER): Payer: Self-pay | Admitting: Gastroenterology

## 2024-08-13 ENCOUNTER — Ambulatory Visit

## 2024-08-13 VITALS — BP 141/72 | HR 63 | Ht 66.0 in | Wt 140.0 lb

## 2024-08-13 DIAGNOSIS — Z0000 Encounter for general adult medical examination without abnormal findings: Secondary | ICD-10-CM

## 2024-08-13 DIAGNOSIS — Z780 Asymptomatic menopausal state: Secondary | ICD-10-CM

## 2024-08-13 NOTE — Patient Instructions (Addendum)
 Mikayla Jenkins,  Thank you for taking the time for your Medicare Wellness Visit. I appreciate your continued commitment to your health goals. Please review the care plan we discussed, and feel free to reach out if I can assist you further.  Medicare recommends these wellness visits once per year to help you and your care team stay ahead of potential health issues. These visits are designed to focus on prevention, allowing your provider to concentrate on managing your acute and chronic conditions during your regular appointments.  Please note that Annual Wellness Visits do not include a physical exam. Some assessments may be limited, especially if the visit was conducted virtually. If needed, we may recommend a separate in-person follow-up with your provider.  Ongoing Care  Seeing your primary care provider every 3 to 6 months helps us  monitor your health and provide consistent, personalized care.   Referrals  When you have completed your advanced care planning documents, you may either personally take them into the office or mail/email them to the addresses listed below:    Osteoporosis Screening An order was placed for you to have your Osteoporosis Screening. Call the number below to schedule that [facility]  [phone]   Recommended Screenings:  Health Maintenance  Topic Date Due   COVID-19 Vaccine (3 - Moderna risk series) 03/18/2020   DEXA scan (bone density measurement)  05/25/2020   DTaP/Tdap/Td vaccine (2 - Td or Tdap) 04/14/2021   Complete foot exam   10/25/2024   Breast Cancer Screening  11/02/2024   Hemoglobin A1C  12/15/2024   Yearly kidney function blood test for diabetes  06/14/2025   Yearly kidney health urinalysis for diabetes  06/14/2025   Eye exam for diabetics  06/15/2025   Medicare Annual Wellness Visit  08/13/2025   Pneumococcal Vaccine for age over 22  Completed   Flu Shot  Completed   Hepatitis C Screening  Completed   Zoster (Shingles) Vaccine  Completed    Meningitis B Vaccine  Aged Out   Colon Cancer Screening  Discontinued       08/13/2024    2:15 PM  Advanced Directives  Does Patient Have a Medical Advance Directive? Yes  Type of Estate Agent of [place];Living will  Does patient want to make changes to medical advance directive? No - Patient declined  Copy of Healthcare Power of Attorney in Chart? Yes - validated most recent copy scanned in chart (See row information)    Advance Care Planning is important because it: Ensures you receive medical care that aligns with your values, goals, and preferences. Provides guidance to your family and loved ones, reducing the emotional burden of decision-making during critical moments.  Vision: Annual vision screenings are recommended for early detection of glaucoma, cataracts, and diabetic retinopathy. These exams can also reveal signs of chronic conditions such as diabetes and high blood pressure.  Dental: Annual dental screenings help detect early signs of oral cancer, gum disease, and other conditions linked to overall health, including heart disease and diabetes.  Please see the attached documents for additional preventive care recommendations.

## 2024-08-13 NOTE — Progress Notes (Addendum)
 Please attest and cosign this visit due to patients primary care provider not being immediately available at the time the visit was completed.   Subjective:   Mikayla Jenkins is a 78 y.o. who presents for a Medicare Wellness preventive visit. As a reminder, Annual Wellness Visits don't include a physical exam, and some assessments may be limited, especially if this visit is performed virtually. We may recommend an in-person follow-up visit with your provider if needed.  Visit Complete: Virtual I connected with  Mikayla Jenkins on 08/13/24 by a audio enabled telemedicine application and verified that I am speaking with the correct person using two identifiers.  Patient Location: Home  Provider Location: Office/Clinic  I discussed the limitations of evaluation and management by telemedicine. The patient expressed understanding and agreed to proceed.  Vital Signs: Because this visit was a virtual/telehealth visit, some criteria may be missing or patient reported. Any vitals not documented were not able to be obtained and vitals that have been documented are patient reported. VideoDeclined- This patient declined Librarian, academic. Therefore the visit was completed with audio only.  Persons Participating in Visit: Patient assisted by Itzamar Traynor (daughter).  AWV Questionnaire: No: Patient Medicare AWV questionnaire was not completed prior to this visit. Cardiac Risk Factors include: advanced age (>1men, >6 women);diabetes mellitus     Objective:    Today's Vitals   08/13/24 1423  BP: (!) 141/72  Pulse: 63  Weight: 140 lb (63.5 kg)  Height: 5' 6 (1.676 m)   Body mass index is 22.6 kg/m.    08/13/2024    2:15 PM 03/17/2023    3:23 PM 03/16/2023    8:21 PM 12/22/2022    4:07 PM 05/31/2022    2:56 PM 05/26/2021    9:57 AM 01/07/2021    8:04 AM  Advanced Directives  Does Patient Have a Medical Advance Directive? Yes  No No No No No  Type of Special Educational Needs Teacher of [place];Living will        Does patient want to make changes to medical advance directive? No - Patient declined        Copy of Healthcare Power of Attorney in Chart? Yes - validated most recent copy scanned in chart (See row information)        Would patient like information on creating a medical advance directive?  No - Patient declined  No - Patient declined No - Patient declined No - Patient declined No - Patient declined    Current Medications (verified) Outpatient Encounter Medications as of 08/13/2024  Medication Sig   acetaminophen  (TYLENOL ) 650 MG CR tablet Take 650 mg by mouth every 8 (eight) hours as needed for pain.   carvedilol  (COREG ) 6.25 MG tablet TAKE 1 TABLET TWICE DAILY   furosemide  (LASIX ) 20 MG tablet TAKE 1 TABLET TWICE WEEKLY ON TUESDAY AND THURSDAY   glipiZIDE  (GLUCOTROL  XL) 2.5 MG 24 hr tablet TAKE 1 TABLET WITH BREAKFAST EVERY TUESDAY, THURSDAY AND SATURDAY   losartan  (COZAAR ) 100 MG tablet Take 1 tablet (100 mg total) by mouth daily.   omeprazole  (PRILOSEC) 40 MG capsule TAKE 1 CAPSULE EVERY DAY   potassium chloride  SA (KLOR-CON  M) 20 MEQ tablet Take 1 tablet (20 mEq total) by mouth 2 (two) times a week. Take on the days you take Lasix .   rosuvastatin  (CRESTOR ) 40 MG tablet Take 1 tablet (40 mg total) by mouth daily.   No facility-administered encounter medications on file as of 08/13/2024.  Allergies (verified) Metformin  and related   History: Past Medical History:  Diagnosis Date   Diabetes mellitus type II    Heart failure with improved ejection fraction (HFimpEF) (HCC)    a. diagnosed in 03/2023 with EF 25-30% by echo and 41% by cMRI b. EF 50-55% by echo in 09/2023   Heart murmur    Hyperlipidemia    Hypertension    Renal disorder    Sleep apnea    Past Surgical History:  Procedure Laterality Date   ABDOMINAL HYSTERECTOMY     BIOPSY  12/28/2022   Procedure: BIOPSY;  Surgeon: Eartha Angelia Sieving, MD;  [facility]: [facility];  Service: Gastroenterology;;   COLONOSCOPY N/A 06/13/2015   Procedure: COLONOSCOPY;  Surgeon: Claudis RAYMOND Rivet, MD;  Location: [facility];  Service: Endoscopy;  Laterality: N/A;  930-rescheduled 8/26 @ 8:30am Ann to notify pt   CORONARY/GRAFT ACUTE MI REVASCULARIZATION N/A 03/16/2023   Procedure: Coronary/Graft Acute MI Revascularization;  Surgeon: Anner Alm ORN, MD;  Location: [facility] INVASIVE CV LAB;  Service: Cardiovascular;  Laterality: N/A;   ESOPHAGOGASTRODUODENOSCOPY (EGD) WITH PROPOFOL  N/A 12/28/2022   Procedure: ESOPHAGOGASTRODUODENOSCOPY (EGD) WITH PROPOFOL ;  Surgeon: Eartha Angelia Sieving, MD;  Location: [facility];  Service: Gastroenterology;  Laterality: N/A;  115pm, asa 3   HERNIA REPAIR     inguinal herniorrhapy right     LEFT HEART CATH AND CORONARY ANGIOGRAPHY N/A 03/16/2023   Procedure: LEFT HEART CATH AND CORONARY ANGIOGRAPHY;  Surgeon: Anner Alm ORN, MD;  Location: [facility] INVASIVE CV LAB;  Service: Cardiovascular;  Laterality: N/A;   right eye surgery secondary to right eye weakness     SALPINGOOPHORECTOMY     TOTAL HIP ARTHROPLASTY Right 01/06/2021   TOTAL HIP ARTHROPLASTY Right 01/06/2021   Procedure: RIGHT TOTAL HIP ARTHROPLASTY ANTERIOR APPROACH;  Surgeon: Vernetta Lonni GRADE, MD;  Location: [facility] OR;  Service: Orthopedics;  Laterality: Right;   TUBAL LIGATION     Family History  Problem Relation Age of Onset   Pneumonia Brother        on continuous O2   Stroke Brother    Stroke Sister    COPD Sister    Diabetes Sister    Stroke Sister    Hypertension Mother    Diabetes Mother    Fibroids Daughter        uterine   Social History   Socioeconomic History   Marital status: Married    Spouse name: [name]   Number of children: 3   Years of education: Not on file   Highest education level: High school graduate  Occupational History   Occupation: Retired  Tobacco Use   Smoking status: Never    Passive exposure: Never   Smokeless tobacco: Never   Vaping Use   Vaping status: Never Used  Substance and Sexual Activity   Alcohol use: No   Drug use: No   Sexual activity: Yes    Birth control/protection: Surgical  Other Topics Concern   Not on file  Social History Narrative   Not on file   Social Drivers of Health   Financial Resource Strain: Low Risk  (08/13/2024)   Overall Financial Resource Strain (CARDIA)    Difficulty of Paying Living Expenses: Not hard at all  Food Insecurity: No Food Insecurity (08/13/2024)   Hunger Vital Sign    Worried About Running Out of Food in the Last Year: Never true    Ran Out of Food in the Last Year: Never true  Transportation  Needs: No Transportation Needs (08/13/2024)   PRAPARE - Administrator, Civil Service (Medical): No    Lack of Transportation (Non-Medical): No  Physical Activity: Insufficiently Active (08/13/2024)   Exercise Vital Sign    Days of Exercise per Week: 3 days    Minutes of Exercise per Session: 30 min  Stress: No Stress Concern Present (08/13/2024)   [employer] of Occupational Health - Occupational Stress Questionnaire    Feeling of Stress: Not at all  Social Connections: Moderately Integrated (08/13/2024)   Social Connection and Isolation Panel    Frequency of Communication with Friends and Family: More than three times a week    Frequency of Social Gatherings with Friends and Family: Once a week    Attends Religious Services: More than 4 times per year    Active Member of [employer] or Organizations: Yes    Attends Banker Meetings: More than 4 times per year    Marital Status: Widowed    Tobacco Counseling Counseling given: Yes   Clinical Intake: Pre-visit preparation completed: Yes Pain : No/denies pain   BMI - recorded: 22.6 Nutritional Status: BMI of 19-24  Normal Nutritional Risks: None Diabetes: Yes CBG done?: No Did pt. bring in CBG monitor from home?: No Lab Results  Component Value Date   HGBA1C 6.3 (H) 06/14/2024    HGBA1C 6.6 (H) 02/23/2024   HGBA1C 6.2 (H) 10/03/2023    How often do you need to have someone help you when you read instructions, pamphlets, or other written materials from your doctor or pharmacy?: 1 - Never Interpreter Needed?: No Information entered by :: Audra Kagel W CMA (AAMA)  Activities of Daily Living     08/13/2024    2:30 PM  In your present state of health, do you have any difficulty performing the following activities:  Hearing? 0  Vision? 0  Difficulty concentrating or making decisions? 1  Comment dementia  Walking or climbing stairs? 0  Dressing or bathing? 0  Doing errands, shopping? 1  Comment no longer drives. children (caregivers)make sure she gets to where she needs to go  Quarry Manager and eating ? N  Using the Toilet? N  In the past six months, have you accidently leaked urine? N  Do you have problems with loss of bowel control? N  Managing your Medications? Y  Managing your Finances? Y  Housekeeping or managing your Housekeeping? Y   Patient Care Team: Antonetta Rollene BRAVO, MD as PCP - General (Family Medicine) Mallipeddi, Diannah SQUIBB, MD as PCP - Cardiology (Cardiology) Rachele Gaynell RAMAN, MD as Consulting Physician (Nephrology) Johnson Laymon HERO, PA-C as Physician Assistant (Cardiology)  I have updated your Care Teams any recent Medical Services you may have received from other providers in the past year.     Assessment:   This is a routine wellness examination for [facility].  Hearing/Vision screen Hearing Screening - Comments:: Patient denies any hearing difficulties.   Vision Screening - Comments:: Wears rx glasses - up to date with routine eye exams with  Dr. Emmitt with [facility]  Goals Addressed               This Visit's Progress     Keep her blood pressure at an optimal level (pt-stated)        COMPLETED: Managing Diabetes, Cardiac illnesses, medicines, nutrition-care coordinator services         Patient will continue to  have improvements of major medical illnesses and community  resources as needed goals met Interventions Today    Flowsheet Row Most Recent Value  Chronic Disease   Chronic disease during today's visit --  [during well Skin irriation from [employer], was healing but irritation returned. Dermatologists reviewed]  General Interventions   General Interventions Discussed/Reviewed General Interventions Reviewed, Doctor Visits, Community Resources  Doctor Visits Discussed/Reviewed Doctor Visits Reviewed, PCP, Specialist  PCP/Specialist Visits Compliance with follow-up visit  Education Interventions   Education Provided Provided Education  [skin care]  Provided Verbal Education On Other, [employer] Resources  Mental Health Interventions   Mental Health Discussed/Reviewed Mental Health Reviewed, Coping Strategies  Pharmacy Interventions   Pharmacy Dicussed/Reviewed Pharmacy Topics Reviewed, Affording Medications  Safety Interventions   Safety Discussed/Reviewed Safety Reviewed, Home Safety  Home Safety Assistive Devices             Depression Screen     08/13/2024    2:47 PM 06/21/2024    2:16 PM 03/01/2024    4:40 PM 10/06/2023    2:13 PM 06/23/2023    3:50 PM 06/23/2023    3:28 PM 03/29/2023    3:32 PM  PHQ 2/9 Scores  PHQ - 2 Score 0 0 0 0 0 0 0  PHQ- 9 Score 3    0 0 2     Fall Risk     08/13/2024    2:26 PM 06/21/2024    2:16 PM 03/01/2024    4:40 PM 11/16/2023    1:02 PM 10/06/2023    2:12 PM  Fall Risk   Falls in the past year? 1 0 0 0 0  Number falls in past yr: 1 0 0 0 0  Injury with Fall? 0 0 0 0 0  Risk for fall due to : Impaired balance/gait   No Fall Risks No Fall Risks  Follow up Falls evaluation completed;Education provided;Falls prevention discussed Falls evaluation completed Falls evaluation completed Falls evaluation completed Falls evaluation completed    MEDICARE RISK AT HOME:  Medicare Risk at Home Any stairs in or around the home?: No If so, are there any  without handrails?: No Home free of loose throw rugs in walkways, pet beds, electrical cords, etc?: Yes Adequate lighting in your home to reduce risk of falls?: Yes Life alert?: No Use of a cane, walker or w/c?: No Grab bars in the bathroom?: Yes Shower chair or bench in shower?: Yes Elevated toilet seat or a handicapped toilet?: Yes  TIMED UP AND GO: Was the test performed?  No  Cognitive Function: Unable: Due to language barrier, hearing or vision limitations or othermemory loss/dementia    08/13/2024    2:49 PM  MMSE - Mini Mental State Exam  Not completed: Unable to complete        05/31/2022    2:57 PM 05/26/2021   10:04 AM 05/22/2020   10:15 AM 05/22/2019   10:56 AM 05/16/2018   10:48 AM  6CIT Screen  What Year? 0 points 0 points 0 points 0 points 0 points  What month? 0 points 0 points 0 points 0 points 0 points  What time? 0 points 0 points 0 points 0 points 0 points  Count back from 20 0 points 0 points 0 points 0 points 0 points  Months in reverse 0 points 0 points 0 points 0 points 0 points  Repeat phrase 0 points 0 points 0 points 0 points 2 points  Total Score 0 points 0 points 0 points 0 points 2 points  Immunizations Immunization History  Administered Date(s) Administered   Fluad Quad(high Dose 65+) 07/02/2019, 08/18/2020, 09/01/2021, 07/21/2022   Fluad Trivalent(High Dose 65+) 06/23/2023   INFLUENZA, HIGH DOSE SEASONAL PF 08/14/2018, 06/21/2024   Influenza Split 08/11/2011, 08/23/2012   Influenza,inj,Quad PF,6+ Mos 08/10/2013, 08/10/2014, 06/30/2015, 07/12/2016, 08/02/2017   Moderna Sars-Covid-2 Vaccination 01/22/2020, 02/19/2020   Pneumococcal Conjugate-13 05/07/2014   Pneumococcal Polysaccharide-23 09/11/2007, 08/11/2011   Tdap 04/15/2011   Zoster Recombinant(Shingrix) 05/02/2021, 04/05/2022    Screening Tests Health Maintenance  Topic Date Due   COVID-19 Vaccine (3 - Moderna risk series) 03/18/2020   DEXA SCAN  05/25/2020   DTaP/Tdap/Td (2 - Td or  Tdap) 04/14/2021   FOOT EXAM  10/25/2024   Mammogram  11/02/2024   HEMOGLOBIN A1C  12/15/2024   Diabetic kidney evaluation - eGFR measurement  06/14/2025   Diabetic kidney evaluation - Urine ACR  06/14/2025   OPHTHALMOLOGY EXAM  06/15/2025   Medicare Annual Wellness (AWV)  08/13/2025   Pneumococcal Vaccine: 50+ Years  Completed   Influenza Vaccine  Completed   Hepatitis C Screening  Completed   Zoster Vaccines- Shingrix  Completed   Meningococcal B Vaccine  Aged Out   Colonoscopy  Discontinued    Health Maintenance Health Maintenance Due  Topic Date Due   COVID-19 Vaccine (3 - Moderna risk series) 03/18/2020   DEXA SCAN  05/25/2020   DTaP/Tdap/Td (2 - Td or Tdap) 04/14/2021   Health Maintenance Items Addressed: DEXA ordered  Additional Screening: Vision Screening: Recommended annual ophthalmology exams for early detection of glaucoma and other disorders of the eye. Would you like a referral to an eye doctor? No    Dental Screening: Recommended annual dental exams for proper oral hygiene  Community Resource Referral / Chronic Care Management: CRR required this visit?  No   CCM required this visit?  No  Plan:   I have personally reviewed and noted the following in the patient's chart:   Medical and social history Use of alcohol, tobacco or illicit drugs  Current medications and supplements including opioid prescriptions. Patient is not currently taking opioid prescriptions. Functional ability and status Nutritional status Physical activity Advanced directives List of other physicians Hospitalizations, surgeries, and ER visits in previous 12 months Vitals Screenings to include cognitive, depression, and falls Referrals and appointments  In addition, I have reviewed and discussed with patient certain preventive protocols, quality metrics, and best practice recommendations. A written personalized care plan for preventive services as well as general preventive health  recommendations were provided to patient.   Derk Doubek, CMA   08/13/2024   After Visit Summary: (MyChart) Due to this being a telephonic visit, the after visit summary with patients personalized plan was offered to patient via MyChart   Notes: Nothing significant to report at this time.

## 2024-08-30 ENCOUNTER — Ambulatory Visit: Payer: Self-pay | Admitting: Internal Medicine

## 2024-08-30 ENCOUNTER — Ambulatory Visit (HOSPITAL_COMMUNITY)
Admission: RE | Admit: 2024-08-30 | Discharge: 2024-08-30 | Disposition: A | Discharge status: Home / Self Care | Source: Ambulatory Visit | Attending: Internal Medicine | Admitting: Internal Medicine

## 2024-08-30 DIAGNOSIS — Z780 Asymptomatic menopausal state: Secondary | ICD-10-CM | POA: Insufficient documentation

## 2024-08-30 DIAGNOSIS — M810 Age-related osteoporosis without current pathological fracture: Secondary | ICD-10-CM | POA: Diagnosis not present

## 2024-08-31 ENCOUNTER — Other Ambulatory Visit (INDEPENDENT_AMBULATORY_CARE_PROVIDER_SITE_OTHER): Payer: Self-pay | Admitting: Gastroenterology

## 2024-09-19 ENCOUNTER — Other Ambulatory Visit: Payer: Self-pay | Admitting: Family Medicine

## 2024-09-20 DIAGNOSIS — E119 Type 2 diabetes mellitus without complications: Secondary | ICD-10-CM | POA: Diagnosis not present

## 2024-09-20 DIAGNOSIS — N189 Chronic kidney disease, unspecified: Secondary | ICD-10-CM | POA: Diagnosis not present

## 2024-09-20 DIAGNOSIS — D631 Anemia in chronic kidney disease: Secondary | ICD-10-CM | POA: Diagnosis not present

## 2024-09-20 DIAGNOSIS — R809 Proteinuria, unspecified: Secondary | ICD-10-CM | POA: Diagnosis not present

## 2024-09-27 DIAGNOSIS — I5032 Chronic diastolic (congestive) heart failure: Secondary | ICD-10-CM | POA: Diagnosis not present

## 2024-09-27 DIAGNOSIS — N1832 Chronic kidney disease, stage 3b: Secondary | ICD-10-CM | POA: Diagnosis not present

## 2024-09-27 DIAGNOSIS — I5042 Chronic combined systolic (congestive) and diastolic (congestive) heart failure: Secondary | ICD-10-CM | POA: Diagnosis not present

## 2024-09-27 DIAGNOSIS — G4733 Obstructive sleep apnea (adult) (pediatric): Secondary | ICD-10-CM | POA: Diagnosis not present

## 2024-09-28 ENCOUNTER — Ambulatory Visit: Payer: Self-pay | Admitting: Family Medicine

## 2024-09-28 DIAGNOSIS — E1169 Type 2 diabetes mellitus with other specified complication: Secondary | ICD-10-CM

## 2024-09-28 DIAGNOSIS — E1122 Type 2 diabetes mellitus with diabetic chronic kidney disease: Secondary | ICD-10-CM

## 2024-09-28 LAB — CBC WITH DIFFERENTIAL/PLATELET
Basophils Absolute: 0 x10E3/uL (ref 0.0–0.2)
Basos: 0 %
EOS (ABSOLUTE): 0.1 x10E3/uL (ref 0.0–0.4)
Eos: 2 %
Hematocrit: 39.8 % (ref 34.0–46.6)
Hemoglobin: 13.1 g/dL (ref 11.1–15.9)
Immature Grans (Abs): 0 x10E3/uL (ref 0.0–0.1)
Immature Granulocytes: 0 %
Lymphocytes Absolute: 1.5 x10E3/uL (ref 0.7–3.1)
Lymphs: 33 %
MCH: 31.5 pg (ref 26.6–33.0)
MCHC: 32.9 g/dL (ref 31.5–35.7)
MCV: 96 fL (ref 79–97)
Monocytes Absolute: 0.4 x10E3/uL (ref 0.1–0.9)
Monocytes: 9 %
Neutrophils Absolute: 2.5 x10E3/uL (ref 1.4–7.0)
Neutrophils: 56 %
Platelets: 182 x10E3/uL (ref 150–450)
RBC: 4.16 x10E6/uL (ref 3.77–5.28)
RDW: 11.8 % (ref 11.7–15.4)
WBC: 4.5 x10E3/uL (ref 3.4–10.8)

## 2024-09-28 LAB — BMP8+EGFR
BUN/Creatinine Ratio: 13 (ref 12–28)
BUN: 15 mg/dL (ref 8–27)
CO2: 25 mmol/L (ref 20–29)
Calcium: 10.1 mg/dL (ref 8.7–10.3)
Chloride: 103 mmol/L (ref 96–106)
Creatinine, Ser: 1.19 mg/dL — ABNORMAL HIGH (ref 0.57–1.00)
Glucose: 151 mg/dL — ABNORMAL HIGH (ref 70–99)
Potassium: 4.4 mmol/L (ref 3.5–5.2)
Sodium: 143 mmol/L (ref 134–144)
eGFR: 47 mL/min/1.73 — ABNORMAL LOW (ref >59)

## 2024-09-28 LAB — LIPID PANEL
Chol/HDL Ratio: 1.7 ratio (ref 0.0–4.4)
Cholesterol, Total: 181 mg/dL (ref 100–199)
HDL: 107 mg/dL (ref >39)
LDL Chol Calc (NIH): 59 mg/dL (ref 0–99)
Triglycerides: 87 mg/dL (ref 0–149)
VLDL Cholesterol Cal: 15 mg/dL (ref 5–40)

## 2024-10-01 LAB — HEPATIC FUNCTION PANEL
ALT: 17 IU/L (ref 0–32)
AST: 22 IU/L (ref 0–40)
Albumin: 4.7 g/dL (ref 3.8–4.8)
Alkaline Phosphatase: 116 IU/L (ref 49–135)
Bilirubin Total: 0.7 mg/dL (ref 0.0–1.2)
Bilirubin, Direct: 0.24 mg/dL (ref 0.00–0.40)
Total Protein: 7 g/dL (ref 6.0–8.5)

## 2024-10-01 LAB — HEMOGLOBIN A1C
Est. average glucose Bld gHb Est-mCnc: 146 mg/dL
Hgb A1c MFr Bld: 6.7 % — AB (ref 4.8–5.6)

## 2024-10-01 LAB — SPECIMEN STATUS REPORT

## 2024-10-04 ENCOUNTER — Other Ambulatory Visit (HOSPITAL_COMMUNITY): Payer: Self-pay | Admitting: Family Medicine

## 2024-10-04 ENCOUNTER — Encounter: Payer: Self-pay | Admitting: Family Medicine

## 2024-10-04 ENCOUNTER — Ambulatory Visit: Admitting: Family Medicine

## 2024-10-04 VITALS — BP 138/76 | HR 70 | Resp 16 | Ht 66.0 in | Wt 140.0 lb

## 2024-10-04 DIAGNOSIS — M818 Other osteoporosis without current pathological fracture: Secondary | ICD-10-CM | POA: Diagnosis not present

## 2024-10-04 DIAGNOSIS — R300 Dysuria: Secondary | ICD-10-CM | POA: Diagnosis not present

## 2024-10-04 DIAGNOSIS — Z1231 Encounter for screening mammogram for malignant neoplasm of breast: Secondary | ICD-10-CM

## 2024-10-04 DIAGNOSIS — I5189 Other ill-defined heart diseases: Secondary | ICD-10-CM | POA: Diagnosis not present

## 2024-10-04 DIAGNOSIS — R011 Cardiac murmur, unspecified: Secondary | ICD-10-CM

## 2024-10-04 DIAGNOSIS — Z0001 Encounter for general adult medical examination with abnormal findings: Secondary | ICD-10-CM | POA: Insufficient documentation

## 2024-10-04 DIAGNOSIS — M810 Age-related osteoporosis without current pathological fracture: Secondary | ICD-10-CM | POA: Insufficient documentation

## 2024-10-04 NOTE — Assessment & Plan Note (Signed)
 Update echo as systolic murmur more prominent

## 2024-10-04 NOTE — Progress Notes (Signed)
° ° °  Mikayla Jenkins     MRN: [id number]      DOB: April 16, 1946  Chief Complaint  Patient presents with   Annual Exam    Cpe. Daughter is asking to check urine sample for dehydration     HPI: Patient is in for annual physical exam. C/o mild dysuria and odor x 1 week Recent labs,  are reviewed. Immunization is reviewed , and  updated if needed.   PE: BP 138/76   Pulse 70   Resp 16   Ht 5' 6 (1.676 m)   Wt 140 lb 0.6 oz (63.5 kg)   SpO2 95%   BMI 22.60 kg/m   Pleasant  female, alert and oriented x 3, in no cardio-pulmonary distress. Afebrile. HEENT No facial trauma or asymetry. Sinuses non tender.  Extra occullar muscles intact.. External ears normal, . Neck: supple, no adenopathy,JVD or thyromegaly.No bruits.  Chest: Clear to ascultation bilaterally.No crackles or wheezes. Non tender to palpation   Cardiovascular system; Heart sounds normal,  S1 and  S2 ,no S3.  Systolic murmur, no thrill. Apical beat not displaced Peripheral pulses normal.  Abdomen: Soft, non tender,   Musculoskeletal  Adequate though reduced ROM of spine, hips , shoulders and knees. No deformity ,swelling or crepitus noted. No muscle wasting or atrophy.   Neurologic: Cranial nerves 2 to 12 intact. Power, tone ,sensation and reflexes normal throughout. No disturbance in gait. No tremor.  Skin: Intact, no ulceration, erythema , scaling or rash noted. Pigmentation normal throughout  Psych; Normal mood and affect. Judgement and concentration normal   Assessment & Plan:  Dysuria CCUA and c/s if abnormal  Systolic murmur Update echo as systolic murmur more prominent  Diastolic dysfunction Update echo  Encounter for Medicare annual examination with abnormal findings Annual exam as documented.  Immunization and cancer screening needs are specifically addressed at this visit.   Osteoporosis Will focus on calcium  and D supplement and exercises 5 days per week 'Daughter wary of GI  s/se of meds, discussed parenteral med , however no interest currently Pt at high fracture risk she has ahd a fracture in the past , will re address at next visit

## 2024-10-04 NOTE — Assessment & Plan Note (Signed)
 Annual exam as documented. . Immunization and cancer screening needs are specifically addressed at this visit.

## 2024-10-04 NOTE — Patient Instructions (Addendum)
 F/U in 5 months, call f you need me sooner  Please schedule mammogram at checkout  Please get TdAP at the pharmacy  Urine for UA and C/S if abnormal  Please start Calcium  citrate 1000 mg total every day , 500 mg twice daily is recommended  Start vit D3 , 1000  units daily  Recommend stool softener colace 100 mg once to twice daily if needed for hard stool  You are being referred for echo due to murmur  It is important that you exercise regularly at least 30 minutes 5 times a week. If you develop chest pain, have severe difficulty breathing, or feel very tired, stop exercising immediately and seek medical attention    Thanks for choosing [facility], we consider it a privelige to serve you.

## 2024-10-04 NOTE — Assessment & Plan Note (Signed)
 Update echo.

## 2024-10-04 NOTE — Assessment & Plan Note (Signed)
CCUA and c/s if abnormal

## 2024-10-04 NOTE — Assessment & Plan Note (Signed)
 Will focus on calcium  and D supplement and exercises 5 days per week 'Daughter wary of GI s/se of meds, discussed parenteral med , however no interest currently Pt at high fracture risk she has ahd a fracture in the past , will re address at next visit

## 2024-10-05 ENCOUNTER — Ambulatory Visit: Admitting: Podiatry

## 2024-10-05 ENCOUNTER — Encounter: Payer: Self-pay | Admitting: Podiatry

## 2024-10-05 ENCOUNTER — Ambulatory Visit: Payer: Self-pay | Admitting: Family Medicine

## 2024-10-05 DIAGNOSIS — M79674 Pain in right toe(s): Secondary | ICD-10-CM | POA: Diagnosis not present

## 2024-10-05 DIAGNOSIS — B351 Tinea unguium: Secondary | ICD-10-CM

## 2024-10-05 DIAGNOSIS — M79675 Pain in left toe(s): Secondary | ICD-10-CM | POA: Diagnosis not present

## 2024-10-05 DIAGNOSIS — M2011 Hallux valgus (acquired), right foot: Secondary | ICD-10-CM

## 2024-10-05 DIAGNOSIS — M2012 Hallux valgus (acquired), left foot: Secondary | ICD-10-CM | POA: Diagnosis not present

## 2024-10-05 DIAGNOSIS — Z0189 Encounter for other specified special examinations: Secondary | ICD-10-CM

## 2024-10-05 DIAGNOSIS — M2042 Other hammer toe(s) (acquired), left foot: Secondary | ICD-10-CM

## 2024-10-05 DIAGNOSIS — E1121 Type 2 diabetes mellitus with diabetic nephropathy: Secondary | ICD-10-CM | POA: Diagnosis not present

## 2024-10-05 DIAGNOSIS — M2041 Other hammer toe(s) (acquired), right foot: Secondary | ICD-10-CM | POA: Diagnosis not present

## 2024-10-05 DIAGNOSIS — L84 Corns and callosities: Secondary | ICD-10-CM

## 2024-10-05 DIAGNOSIS — E119 Type 2 diabetes mellitus without complications: Secondary | ICD-10-CM

## 2024-10-05 NOTE — Progress Notes (Signed)
 "  Subjective:  Patient ID: Mikayla Jenkins, female    DOB: 1946-08-27,  MRN: [id number]  Mikayla Jenkins presents to clinic today for preventative diabetic foot care for painful mycotic toenails of both feet that are difficult to trim. Pain interferes with daily activities and wearing enclosed shoe gear comfortably.  Patient is accompanied by her daughter, Mikayla Jenkins, on today's visit. They are concerned about discomfort Mikayla Jenkins is having her right 2nd hammertoe. Mikayla Jenkins points to the dorsal PIPJ, where she has s corn.  Chief Complaint  Patient presents with   Nail Problem     Return in about 3 months (around 10/04/2024) for Mikayla Jenkins, Routine Foot Care. A1c 6.7 She saw Dr. Antonetta on yesterday    Diabetes   New problem(s): None.   PCP is Mikayla Rollene BRAVO, MD.  Allergies[1]  Review of Systems: Negative except as noted in the HPI.  Objective: No changes noted in today's physical examination. There were no vitals filed for this visit. Mikayla Jenkins is a pleasant 78 y.o. female WD, WN in NAD. AAO x 3.  Vascular Examination: Capillary refill time immediate b/l. Palpable pedal pulses. Pedal hair present b/l. Pedal edema absent. No pain with calf compression b/l. Skin temperature gradient WNL b/l. No cyanosis or clubbing b/l. No ischemia or gangrene noted b/l.   Neurological Examination: Sensation grossly intact b/l with 10 gram monofilament. Vibratory sensation intact b/l.   Dermatological Examination: Pedal skin with normal turgor, texture and tone b/l.  No open wounds. No interdigital macerations.   Toenails 1-5 b/l thick, discolored, elongated with subungual debris and pain on dorsal palpation.   Preulcerative lesion noted dorsal PIPJ of right second digit. There is pain on palpation. There is no surrounding erythema, no edema, no drainage, no odor, no fluctuance.  Musculoskeletal Examination: Muscle strength 5/5 to all lower extremity muscle groups bilaterally. HAV with bunion deformity  noted b/l LE. Hammertoe(s) 3-5 b/l.  Rigid hammertoe deformity noted R 2nd toe..  Patient ambulates independently without assistive aids.  Radiographs: None  Last A1c:      Latest Ref Rng & Units 09/27/2024   10:49 AM 06/14/2024    3:41 PM 02/23/2024   11:06 AM  Hemoglobin A1C  Hemoglobin-A1c 4.8 - 5.6 % 6.7  6.3  6.6    Assessment/Plan: 1. Pain due to onychomycosis of toenails of both feet   2. Hallux valgus, acquired, bilateral   3. Acquired hammertoes of both feet   4. Corns   5. Type 2 diabetes mellitus with nephropathy (HCC)   6. Encounter for diabetic foot exam (HCC)    Diabetic foot examination performed today. All patient's and/or POA's questions/concerns addressed on today's visit. Toenails 1-5 b/l debrided in length and girth without incident. Continue foot and shoe inspections daily. Monitor blood glucose per PCP/Endocrinologist's recommendations. Continue soft, supportive shoe gear daily. Report any pedal injuries to medical professional. Call office if there are any questions/concerns. -Rx to be sent to [facility] for one pair diabetic shoes and 3 pair heat moldable insoles. To [facility] [address] [place], [place] [address] Phone: [phone] Fax: [phone]. -Toenails 1-5 b/l were debrided in length and girth with sterile nail nippers and dremel without iatrogenic bleeding.  -Preulcerative lesion pared dorsal PIPJ of R 2nd toe utilizing sterile scalpel blade. Total number pared=1. -Dispensed silipos toe pad. Apply to R 2nd toe every morning. Remove every evening. -Patient/POA to call should there be question/concern in  the interim.   Return in about 3 months (around 01/03/2025).  Mikayla Jenkins, DPM      [facility]la [facility] LOCATION: [address] [place] [address]                   Office [phone]   Mikayla Jenkins LOCATION: [address] [place], [place] [address] Office [phone]      [1]  Allergies Allergen Reactions   Metformin  And Related Other (See Comments)    CKD stage 4   "

## 2024-10-08 ENCOUNTER — Other Ambulatory Visit: Payer: Self-pay

## 2024-10-08 DIAGNOSIS — I5021 Acute systolic (congestive) heart failure: Secondary | ICD-10-CM

## 2024-10-08 DIAGNOSIS — R011 Cardiac murmur, unspecified: Secondary | ICD-10-CM

## 2024-10-10 LAB — UA/M W/RFLX CULTURE, ROUTINE
Bilirubin, UA: NEGATIVE
Glucose, UA: NEGATIVE
Ketones, UA: NEGATIVE
Nitrite, UA: NEGATIVE
Protein,UA: NEGATIVE
Specific Gravity, UA: 1.011 (ref 1.005–1.030)
Urobilinogen, Ur: 0.2 mg/dL (ref 0.2–1.0)
pH, UA: 6 (ref 5.0–7.5)

## 2024-10-10 LAB — URINE CULTURE, REFLEX

## 2024-10-10 LAB — MICROSCOPIC EXAMINATION
Casts: NONE SEEN /LPF
RBC, Urine: >30 /HPF — AB (ref 0–2)
WBC, UA: >30 /HPF — AB (ref 0–5)

## 2024-10-12 MED ORDER — NITROFURANTOIN MONOHYD MACRO 100 MG PO CAPS: 100.0000 mg | ORAL_CAPSULE | Freq: Two times a day (BID) | ORAL | 0 refills | Status: AC

## 2024-11-05 ENCOUNTER — Ambulatory Visit (HOSPITAL_COMMUNITY)

## 2024-11-07 ENCOUNTER — Other Ambulatory Visit: Payer: Self-pay | Admitting: Student

## 2024-11-08 ENCOUNTER — Encounter (HOSPITAL_COMMUNITY): Payer: Self-pay

## 2024-11-08 ENCOUNTER — Ambulatory Visit (HOSPITAL_COMMUNITY)
Admission: RE | Admit: 2024-11-08 | Discharge: 2024-11-08 | Disposition: A | Discharge status: Home / Self Care | Source: Ambulatory Visit | Attending: Family Medicine | Admitting: Family Medicine

## 2024-11-08 DIAGNOSIS — Z1231 Encounter for screening mammogram for malignant neoplasm of breast: Secondary | ICD-10-CM | POA: Insufficient documentation

## 2024-11-15 ENCOUNTER — Ambulatory Visit (HOSPITAL_COMMUNITY)
Admission: RE | Admit: 2024-11-15 | Discharge: 2024-11-15 | Disposition: A | Discharge status: Home / Self Care | Source: Ambulatory Visit | Attending: Student | Admitting: Student

## 2024-11-15 DIAGNOSIS — I5021 Acute systolic (congestive) heart failure: Secondary | ICD-10-CM | POA: Diagnosis present

## 2024-11-15 DIAGNOSIS — R011 Cardiac murmur, unspecified: Secondary | ICD-10-CM | POA: Diagnosis present

## 2024-11-15 LAB — ECHOCARDIOGRAM COMPLETE
Area-P 1/2: 1.56 cm2
Calc EF: 55.5 %
S' Lateral: 2.6 cm
Single Plane A2C EF: 53.2 %
Single Plane A4C EF: 59.4 %

## 2024-11-15 NOTE — Progress Notes (Signed)
*  PRELIMINARY RESULTS* Echocardiogram 2D Echocardiogram has been performed.  Mikayla Jenkins 11/15/2024, 3:12 PM

## 2024-11-16 ENCOUNTER — Ambulatory Visit: Payer: Self-pay | Admitting: Student

## 2025-01-10 ENCOUNTER — Ambulatory Visit: Admitting: Podiatry

## 2025-01-17 ENCOUNTER — Ambulatory Visit: Admitting: Student

## 2025-03-07 ENCOUNTER — Ambulatory Visit: Payer: Self-pay | Admitting: Family Medicine

## 2025-08-14 ENCOUNTER — Ambulatory Visit
